# Patient Record
Sex: Female | Born: 1980 | Race: White | Hispanic: No | Marital: Single | Smoking: Current every day smoker
Health system: Southern US, Community
[De-identification: ages and names within clinical notes are randomized; demographics above are authoritative.]

## PROBLEM LIST (undated history)

## (undated) ENCOUNTER — Emergency Department: Payer: 59 | Source: Home / Self Care

## (undated) DIAGNOSIS — G43909 Migraine, unspecified, not intractable, without status migrainosus: Secondary | ICD-10-CM

## (undated) DIAGNOSIS — F329 Major depressive disorder, single episode, unspecified: Secondary | ICD-10-CM

## (undated) DIAGNOSIS — E119 Type 2 diabetes mellitus without complications: Secondary | ICD-10-CM

## (undated) DIAGNOSIS — F32A Depression, unspecified: Secondary | ICD-10-CM

## (undated) DIAGNOSIS — F99 Mental disorder, not otherwise specified: Secondary | ICD-10-CM

## (undated) DIAGNOSIS — F419 Anxiety disorder, unspecified: Secondary | ICD-10-CM

## (undated) DIAGNOSIS — J449 Chronic obstructive pulmonary disease, unspecified: Secondary | ICD-10-CM

## (undated) DIAGNOSIS — M1990 Unspecified osteoarthritis, unspecified site: Secondary | ICD-10-CM

## (undated) HISTORY — PX: MOUTH SURGERY: SHX715

---

## 2006-11-06 ENCOUNTER — Ambulatory Visit: Payer: Self-pay | Admitting: Internal Medicine

## 2012-09-20 DIAGNOSIS — F603 Borderline personality disorder: Secondary | ICD-10-CM | POA: Diagnosis present

## 2012-09-20 DIAGNOSIS — F329 Major depressive disorder, single episode, unspecified: Secondary | ICD-10-CM | POA: Diagnosis present

## 2012-09-20 DIAGNOSIS — F4310 Post-traumatic stress disorder, unspecified: Secondary | ICD-10-CM | POA: Diagnosis present

## 2016-07-05 ENCOUNTER — Ambulatory Visit
Admission: EM | Admit: 2016-07-05 | Discharge: 2016-07-05 | Disposition: A | Discharge status: Home / Self Care | Payer: Medicare Other | Attending: Internal Medicine | Admitting: Internal Medicine

## 2016-07-05 ENCOUNTER — Encounter: Payer: Self-pay | Admitting: *Deleted

## 2016-07-05 DIAGNOSIS — M25462 Effusion, left knee: Secondary | ICD-10-CM | POA: Diagnosis not present

## 2016-07-05 HISTORY — DX: Mental disorder, not otherwise specified: F99

## 2016-07-05 HISTORY — DX: Migraine, unspecified, not intractable, without status migrainosus: G43.909

## 2016-07-05 MED ORDER — TRIAMCINOLONE ACETONIDE 40 MG/ML IJ SUSP: 40.0000 mg | Freq: Once | INTRAMUSCULAR | Status: DC

## 2016-07-05 MED ORDER — CEPHALEXIN 500 MG PO CAPS: 500.0000 mg | ORAL_CAPSULE | Freq: Three times a day (TID) | ORAL | 0 refills | Status: AC

## 2016-07-05 MED ORDER — LIDOCAINE HCL (PF) 1 % IJ SOLN: 5.0000 mL | Freq: Once | INTRAMUSCULAR | Status: DC

## 2016-07-05 NOTE — ED Triage Notes (Signed)
Left knee pain x2 weeks. Gradual onset. Feels as though left knee will "lock up or give way". Denies recent injury.

## 2016-07-20 ENCOUNTER — Ambulatory Visit
Admission: EM | Admit: 2016-07-20 | Discharge: 2016-07-20 | Disposition: A | Discharge status: Home / Self Care | Payer: Medicare Other | Attending: Family Medicine | Admitting: Family Medicine

## 2016-07-20 ENCOUNTER — Encounter: Payer: Self-pay | Admitting: Emergency Medicine

## 2016-07-20 DIAGNOSIS — J069 Acute upper respiratory infection, unspecified: Secondary | ICD-10-CM

## 2016-07-20 DIAGNOSIS — L502 Urticaria due to cold and heat: Secondary | ICD-10-CM

## 2016-07-20 DIAGNOSIS — B9789 Other viral agents as the cause of diseases classified elsewhere: Secondary | ICD-10-CM

## 2016-07-20 NOTE — Discharge Instructions (Signed)
Zyrtec or Allegra once a day in morning Plus one Pepcid or Zanctac tablet in morning Then Benadryl at night

## 2016-07-20 NOTE — ED Provider Notes (Signed)
[facility]    CSN: [id number] Arrival date & time: 07/20/16  8295     History   Chief Complaint Chief Complaint  Patient presents with  . Cough  . Rash    HPI Natalie Rose is a 36 y.o. female.   The history is provided by the patient.  URI  Presenting symptoms: congestion, cough and rhinorrhea   Severity:  Moderate Onset quality:  Sudden Duration:  3 days Timing:  Constant Progression:  Unchanged Chronicity:  New Associated symptoms comment:  Itchy rash Risk factors: sick contacts   Risk factors: not elderly, no chronic cardiac disease, no chronic kidney disease, no chronic respiratory disease, no diabetes mellitus, no immunosuppression, no recent illness and no recent travel     Past Medical History:  Diagnosis Date  . Mental health disorder   . Migraines     There are no active problems to display for this patient.   History reviewed. No pertinent surgical history.  OB History    No data available       Home Medications    Prior to Admission medications   Medication Sig Start Date End Date Taking? Authorizing Provider  gabapentin (NEURONTIN) 300 MG capsule Take 300 mg by mouth 3 (three) times daily.    Historical Provider, MD  QUEtiapine (SEROQUEL) 100 MG tablet Take 100 mg by mouth at bedtime.    Historical Provider, MD  venlafaxine (EFFEXOR) 37.5 MG tablet Take 225 mg by mouth 2 (two) times daily.    Historical Provider, MD    Family History History reviewed. No pertinent family history.  Social History Social History  Substance Use Topics  . Smoking status: Current Every Day Smoker  . Smokeless tobacco: Never Used  . Alcohol use No     Allergies   Patient has no known allergies.   Review of Systems Review of Systems  HENT: Positive for congestion and rhinorrhea.   Respiratory: Positive for cough.      Physical Exam Triage Vital Signs ED Triage Vitals  Enc Vitals Group     BP 07/20/16 1028 104/72     Pulse  Rate 07/20/16 1028 91     Resp 07/20/16 1028 16     Temp 07/20/16 1028 99.1 F (37.3 C)     Temp Source 07/20/16 1028 Oral     SpO2 07/20/16 1028 99 %     Weight 07/20/16 1027 160 lb (72.6 kg)     Height 07/20/16 1027 4\' 11"  (1.499 m)     Head Circumference --      Peak Flow --      Pain Score 07/20/16 1028 0     Pain Loc --      Pain Edu? --      Excl. in GC? --    No data found.   Updated Vital Signs BP 104/72 (BP Location: Left Arm)   Pulse 91   Temp 99.1 F (37.3 C) (Oral)   Resp 16   Ht 4\' 11"  (1.499 m)   Wt 160 lb (72.6 kg)   LMP 07/04/2016 (Exact Date)   SpO2 99%   BMI 32.32 kg/m   Visual Acuity Right Eye Distance:   Left Eye Distance:   Bilateral Distance:    Right Eye Near:   Left Eye Near:    Bilateral Near:     Physical Exam  Constitutional: She appears well-developed and well-nourished. No distress.  HENT:  Head: Normocephalic and atraumatic.  Right Ear: Tympanic  membrane, external ear and ear canal normal.  Left Ear: Tympanic membrane, external ear and ear canal normal.  Nose: No mucosal edema, rhinorrhea, nose lacerations, sinus tenderness, nasal deformity, septal deviation or nasal septal hematoma. No epistaxis.  No foreign bodies. Right sinus exhibits no maxillary sinus tenderness and no frontal sinus tenderness. Left sinus exhibits no maxillary sinus tenderness and no frontal sinus tenderness.  Mouth/Throat: Uvula is midline, oropharynx is clear and moist and mucous membranes are normal. No oropharyngeal exudate.  Eyes: Conjunctivae and EOM are normal. Pupils are equal, round, and reactive to light. Right eye exhibits no discharge. Left eye exhibits no discharge. No scleral icterus.  Neck: Normal range of motion. Neck supple. No thyromegaly present.  Cardiovascular: Normal rate, regular rhythm and normal heart sounds.   Pulmonary/Chest: Effort normal and breath sounds normal. No respiratory distress. She has no wheezes. She has no rales.    Lymphadenopathy:    She has no cervical adenopathy.  Skin: Rash noted. Rash is urticarial. She is not diaphoretic.     Nursing note and vitals reviewed.    UC Treatments / Results  Labs (all labs ordered are listed, but only abnormal results are displayed) Labs Reviewed - No data to display  EKG  EKG Interpretation None       Radiology No results found.  Procedures Procedures (including critical care time)  Medications Ordered in UC Medications - No data to display   Initial Impression / Assessment and Plan / UC Course  I have reviewed the triage vital signs and the nursing notes.  Pertinent labs & imaging results that were available during my care of the patient were reviewed by me and considered in my medical decision making (see chart for details).       Final Clinical Impressions(s) / UC Diagnoses   Final diagnoses:  Urticaria due to cold  Viral URI with cough    New Prescriptions Discharge Medication List as of 07/20/2016 11:02 AM     1. diagnosis reviewed with patient 2.  Recommend supportive treatment with otc antihistamines, fluids, otc cold/cough medication   3. Follow-up prn if symptoms worsen or don't improve   Payton Mccallumrlando Stella Encarnacion, MD 07/20/16 2039

## 2016-07-20 NOTE — ED Triage Notes (Signed)
Patient c/o itchy rash on her right arm and legs that started last night.  Patient also c/o cough for the past 2 days. Patient denies fevers.

## 2016-08-05 NOTE — ED Provider Notes (Signed)
[facility]    CSN: [id number] Arrival date & time: 07/05/16  1544     History   Chief Complaint Chief Complaint  Patient presents with  . Knee Pain    HPI Natalie Mohmanda K Rose is a 36 y.o. female.   C/o left knee swelling.  Denies fevers or rash. Painful to palpation.  Denies injury to knee.      Past Medical History:  Diagnosis Date  . Mental health disorder   . Migraines     There are no active problems to display for this patient.   History reviewed. No pertinent surgical history.  OB History    No data available       Home Medications    Prior to Admission medications   Medication Sig Start Date End Date Taking? Authorizing Provider  gabapentin (NEURONTIN) 300 MG capsule Take 300 mg by mouth 3 (three) times daily.   Yes Historical Provider, MD  QUEtiapine (SEROQUEL) 100 MG tablet Take 100 mg by mouth at bedtime.   Yes Historical Provider, MD  venlafaxine (EFFEXOR) 37.5 MG tablet Take 225 mg by mouth 2 (two) times daily.   Yes Historical Provider, MD    Family History History reviewed. No pertinent family history.  Social History Social History  Substance Use Topics  . Smoking status: Current Every Day Smoker  . Smokeless tobacco: Never Used  . Alcohol use No     Allergies   Patient has no known allergies.   Review of Systems Review of Systems  Constitutional: Negative for chills and fever.  HENT: Negative for sore throat and tinnitus.   Eyes: Negative for redness.  Respiratory: Negative for cough and shortness of breath.   Cardiovascular: Negative for chest pain and palpitations.  Gastrointestinal: Negative for abdominal pain, diarrhea, nausea and vomiting.  Genitourinary: Negative for dysuria, frequency and urgency.  Musculoskeletal: Positive for joint swelling. Negative for myalgias.  Skin: Negative for rash.       No lesions  Neurological: Negative for weakness.  Hematological: Does not bruise/bleed easily.    Psychiatric/Behavioral: Negative for suicidal ideas.     Physical Exam Triage Vital Signs ED Triage Vitals  Enc Vitals Group     BP 07/05/16 1607 126/78     Pulse Rate 07/05/16 1607 (!) 55     Resp 07/05/16 1607 16     Temp 07/05/16 1607 99 F (37.2 C)     Temp Source 07/05/16 1607 Oral     SpO2 07/05/16 1607 100 %     Weight 07/05/16 1608 160 lb (72.6 kg)     Height 07/05/16 1608 4\' 11"  (1.499 m)     Head Circumference --      Peak Flow --      Pain Score 07/05/16 1810 0     Pain Loc --      Pain Edu? --      Excl. in GC? --    No data found.   Updated Vital Signs BP 126/78 (BP Location: Left Arm)   Pulse (!) 55   Temp 99 F (37.2 C) (Oral)   Resp 16   Ht 4\' 11"  (1.499 m)   Wt 160 lb (72.6 kg)   LMP 07/04/2016   SpO2 100%   BMI 32.32 kg/m   Visual Acuity Right Eye Distance:   Left Eye Distance:   Bilateral Distance:    Right Eye Near:   Left Eye Near:    Bilateral Near:     Physical  Exam  Constitutional: She is oriented to person, place, and time. She appears well-developed and well-nourished. No distress.  HENT:  Head: Normocephalic and atraumatic.  Mouth/Throat: Oropharynx is clear and moist.  Eyes: Conjunctivae and EOM are normal. Pupils are equal, round, and reactive to light. No scleral icterus.  Neck: Normal range of motion. Neck supple. No JVD present. No tracheal deviation present. No thyromegaly present.  Cardiovascular: Normal rate, regular rhythm and normal heart sounds.  Exam reveals no gallop and no friction rub.   No murmur heard. Pulmonary/Chest: Effort normal and breath sounds normal.  Abdominal: Soft. Bowel sounds are normal. She exhibits no distension. There is no tenderness.  Musculoskeletal: Normal range of motion. She exhibits no edema.  Left knee ballotable  Lymphadenopathy:    She has no cervical adenopathy.  Neurological: She is alert and oriented to person, place, and time. No cranial nerve deficit.  Skin: Skin is warm and  dry.  Psychiatric: She has a normal mood and affect. Her behavior is normal. Judgment and thought content normal.  Nursing note and vitals reviewed.    UC Treatments / Results  Labs (all labs ordered are listed, but only abnormal results are displayed) Labs Reviewed - No data to display  EKG  EKG Interpretation None       Radiology No results found.  Procedures Procedures (including critical care time)  Medications Ordered in UC Medications - No data to display   Initial Impression / Assessment and Plan / UC Course  I have reviewed the triage vital signs and the nursing notes.  Pertinent labs & imaging results that were available during my care of the patient were reviewed by me and considered in my medical decision making (see chart for details).     Attempted aspiration of left knee but scant amount of fluid obtained. Minimal blood loss.  Pt tolerated procedure well.  Rx abx prophylactically  Final Clinical Impressions(s) / UC Diagnoses   Final diagnoses:  Effusion of left knee    New Prescriptions Discharge Medication List as of 07/05/2016  6:06 PM    START taking these medications   Details  cephALEXin (KEFLEX) 500 MG capsule Take 1 capsule (500 mg total) by mouth 3 (three) times daily., Starting Thu 07/05/2016, Until Thu 07/12/2016, Normal         Arnaldo Natal, MD 08/05/16 [phone]

## 2017-01-28 ENCOUNTER — Encounter: Payer: Self-pay | Admitting: Emergency Medicine

## 2017-01-28 ENCOUNTER — Ambulatory Visit
Admission: EM | Admit: 2017-01-28 | Discharge: 2017-01-28 | Disposition: A | Discharge status: Home / Self Care | Payer: Medicare Other | Attending: Family Medicine | Admitting: Family Medicine

## 2017-01-28 DIAGNOSIS — S161XXA Strain of muscle, fascia and tendon at neck level, initial encounter: Secondary | ICD-10-CM

## 2017-01-28 DIAGNOSIS — S20211A Contusion of right front wall of thorax, initial encounter: Secondary | ICD-10-CM

## 2017-01-28 LAB — RAPID STREP SCREEN (MED CTR MEBANE ONLY): Streptococcus, Group A Screen (Direct): NEGATIVE

## 2017-01-28 NOTE — ED Triage Notes (Signed)
Patient c/o sore throat and stomach pain that started 2-3 days.

## 2017-01-28 NOTE — ED Provider Notes (Addendum)
[facility]    CSN: [id number] Arrival date & time: 01/28/17  1455     History   Chief Complaint Chief Complaint  Patient presents with  . Sore Throat  . Abdominal Pain    HPI Natalie Rose is a 36 y.o. female.   HPI  This is a 36 year old female presents with a sore throat and diffuse stomach pain that started approximately 2-3 days ago. She states this morning she started with dry heave & coughing. She also has had loose but formed stool without any blood or mucus. She also states that she fell about 2 days ago she slipped on wet steps and fell down 4 steps  On her back and side. She has a bruise on her left forearm is resolving. There are no bruises on her back or her buttock.       Past Medical History:  Diagnosis Date  . Mental health disorder   . Migraines     There are no active problems to display for this patient.   History reviewed. No pertinent surgical history.  OB History    No data available       Home Medications    Prior to Admission medications   Medication Sig Start Date End Date Taking? Authorizing Provider  cyclobenzaprine (FLEXERIL) 10 MG tablet Take 10 mg by mouth 3 (three) times daily as needed for muscle spasms.   Yes [provider]  lamoTRIgine (LAMICTAL) 200 MG tablet Take 200 mg by mouth daily.   Yes [provider]  naproxen (NAPROSYN) 500 MG tablet Take 500 mg by mouth 2 (two) times daily with a meal.   Yes [provider]  gabapentin (NEURONTIN) 300 MG capsule Take 300 mg by mouth 3 (three) times daily.    [provider]  venlafaxine (EFFEXOR) 37.5 MG tablet Take 225 mg by mouth 2 (two) times daily.    [provider]    Family History History reviewed. No pertinent family history.  Social History Social History  Substance Use Topics  . Smoking status: Current Every Day Smoker  . Smokeless tobacco: Never Used  . Alcohol use No     Allergies   Valium  [diazepam]   Review of Systems Review of Systems  Constitutional: Positive for activity change. Negative for chills, fatigue and fever.  Gastrointestinal: Positive for abdominal pain, diarrhea and vomiting.  Musculoskeletal: Positive for back pain and myalgias.  All other systems reviewed and are negative.    Physical Exam Triage Vital Signs ED Triage Vitals  Enc Vitals Group     BP 01/28/17 1513 107/68     Pulse Rate 01/28/17 1513 96     Resp 01/28/17 1513 16     Temp 01/28/17 1513 98.6 F (37 C)     Temp Source 01/28/17 1513 Oral     SpO2 01/28/17 1513 100 %     Weight 01/28/17 1511 160 lb (72.6 kg)     Height --      Head Circumference --      Peak Flow --      Pain Score 01/28/17 1511 6     Pain Loc --      Pain Edu? --      Excl. in GC? --    No data found.   Updated Vital Signs BP 107/68 (BP Location: Left Arm)   Pulse 96   Temp 98.6 F (37 C) (Oral)   Resp 16   Wt 160 lb (  72.6 kg)   LMP 01/07/2017 (Approximate)   SpO2 100%   BMI 32.32 kg/m   Visual Acuity Right Eye Distance:   Left Eye Distance:   Bilateral Distance:    Right Eye Near:   Left Eye Near:    Bilateral Near:     Physical Exam  Constitutional: She is oriented to person, place, and time. She appears well-developed and well-nourished. No distress.  Examination was performed in the presence of Heather, RN as chaperone and assisted.  HENT:  Head: Normocephalic.  Right Ear: External ear normal.  Left Ear: External ear normal.  Nose: Nose normal.  Mouth/Throat: Oropharynx is clear and moist. No oropharyngeal exudate.  Eyes: Pupils are equal, round, and reactive to light. Right eye exhibits no discharge. Left eye exhibits no discharge.  Neck:  Examination of the cervical spine shows tenderness to palpation in the sternocleidomastoid muscles as well as the paraspinous cervical muscles. She is able to forward flex to well but extension causes her reproduction of the pain and she's been  having in her neck. It is also decreased somewhat.  Pulmonary/Chest: Effort normal and breath sounds normal. She exhibits tenderness.  There is point tenderness over her right sided ribs from the mid scapular line and laterally to the anterior axillary line. Reproduces her symptoms. She does not have significant abdominal tenderness.  Abdominal: Soft. Bowel sounds are normal. She exhibits no distension. There is no tenderness. There is no rebound and no guarding.  Musculoskeletal: Normal range of motion. She exhibits tenderness.  Neurological: She is alert and oriented to person, place, and time.  Skin: Skin is warm and dry. She is not diaphoretic.  Psychiatric: She has a normal mood and affect. Her behavior is normal. Judgment and thought content normal.  Nursing note and vitals reviewed.    UC Treatments / Results  Labs (all labs ordered are listed, but only abnormal results are displayed) Labs Reviewed  RAPID STREP SCREEN (NOT AT [facility])  CULTURE, GROUP A STREP [facility])    EKG  EKG Interpretation None       Radiology No results found.  Procedures Procedures (including critical care time)  Medications Ordered in UC Medications - No data to display   Initial Impression / Assessment and Plan / UC Course  I have reviewed the triage vital signs and the nursing notes.  Pertinent labs & imaging results that were available during my care of the patient were reviewed by me and considered in my medical decision making (see chart for details).     Plan: 1. Test/x-ray results and diagnosis reviewed with patient 2. rx as per orders; risks, benefits, potential side effects reviewed with patient 3. Recommend supportive treatment with use of ice on the areas of pain 20 minutes out of every 2 hours 4-5 times daily. Patient already has prescriptions for Flexeril and for Naprosyn which she will take for the pain and muscle spasm. I recommended that she follow-up with her primary care  physician if she is not improving in a week or 2. I was reminded her that she must cough and deep breathe to prevent pneumonitis or pneumonia. 4. F/u prn if symptoms worsen or don't improve   Final Clinical Impressions(s) / UC Diagnoses   Final diagnoses:  Contusion of ribs, right, initial encounter  Cervical strain, acute, initial encounter    New Prescriptions Discharge Medication List as of 01/28/2017  3:51 PM       Controlled Substance Prescriptions [place] Controlled Substance Registry consulted?  Not Applicable   Lutricia Feil, PA-C 01/28/17 1600    Lutricia Feil, PA-C 01/28/17 1601

## 2017-01-31 LAB — CULTURE, GROUP A STREP (THRC)

## 2017-02-19 ENCOUNTER — Encounter: Payer: Self-pay | Admitting: *Deleted

## 2017-02-19 ENCOUNTER — Ambulatory Visit
Admission: EM | Admit: 2017-02-19 | Discharge: 2017-02-19 | Disposition: A | Discharge status: Other Acute Inpatient Hospital | Payer: Medicare Other | Attending: Family Medicine | Admitting: Family Medicine

## 2017-02-19 DIAGNOSIS — R1011 Right upper quadrant pain: Secondary | ICD-10-CM | POA: Diagnosis not present

## 2017-02-19 NOTE — ED Provider Notes (Signed)
[facility]    CSN: [id number] Arrival date & time: 02/19/17  1557     History   Chief Complaint Chief Complaint  Patient presents with  . Abdominal Pain  . Nausea    HPI Natalie Rose is a 36 y.o. female.   The history is provided by the patient.  Abdominal Pain  Pain location:  RUQ Pain quality: aching   Pain radiates to:  Does not radiate Pain severity:  Severe Onset quality:  Sudden Duration:  12 hours Timing:  Constant Progression:  Worsening Chronicity:  New Context: eating   Context: not alcohol use, not awakening from sleep, not diet changes, not laxative use, not medication withdrawal, not previous surgeries, not recent illness, not recent sexual activity, not recent travel, not retching, not sick contacts, not suspicious food intake and not trauma   Relieved by:  Nothing Ineffective treatments:  None tried Associated symptoms: nausea   Associated symptoms: no anorexia, no belching, no chest pain, no chills, no constipation, no cough, no diarrhea, no dysuria, no fatigue, no fever, no flatus, no hematemesis, no hematochezia, no hematuria, no melena, no shortness of breath, no sore throat, no vaginal bleeding, no vaginal discharge and no vomiting   Risk factors: no alcohol abuse, no aspirin use, not elderly, has not had multiple surgeries, no NSAID use, not obese, not pregnant and no recent hospitalization     Past Medical History:  Diagnosis Date  . Mental health disorder   . Migraines     There are no active problems to display for this patient.   History reviewed. No pertinent surgical history.  OB History    No data available       Home Medications    Prior to Admission medications   Medication Sig Start Date End Date Taking? Authorizing Provider  cyclobenzaprine (FLEXERIL) 10 MG tablet Take 10 mg by mouth 3 (three) times daily as needed for muscle spasms.   Yes [provider]  gabapentin (NEURONTIN) 300 MG capsule  Take 300 mg by mouth 3 (three) times daily.   Yes [provider]  naproxen (NAPROSYN) 500 MG tablet Take 500 mg by mouth 2 (two) times daily with a meal.   Yes [provider]  sertraline (ZOLOFT) 50 MG tablet Take 50 mg by mouth daily.   Yes [provider]  lamoTRIgine (LAMICTAL) 200 MG tablet Take 200 mg by mouth daily.    [provider]  venlafaxine (EFFEXOR) 37.5 MG tablet Take 225 mg by mouth 2 (two) times daily.    [provider]    Family History History reviewed. No pertinent family history.  Social History Social History  Substance Use Topics  . Smoking status: Current Every Day Smoker  . Smokeless tobacco: Never Used  . Alcohol use No     Allergies   Valium [diazepam]   Review of Systems Review of Systems  Constitutional: Negative for chills, fatigue and fever.  HENT: Negative for sore throat.   Respiratory: Negative for cough and shortness of breath.   Cardiovascular: Negative for chest pain.  Gastrointestinal: Positive for abdominal pain and nausea. Negative for anorexia, constipation, diarrhea, flatus, hematemesis, hematochezia, melena and vomiting.  Genitourinary: Negative for dysuria, hematuria, vaginal bleeding and vaginal discharge.     Physical Exam Triage Vital Signs ED Triage Vitals  Enc Vitals Group     BP 02/19/17 1728 113/77     Pulse Rate 02/19/17 1728 80     Resp 02/19/17 1728  16     Temp 02/19/17 1728 97.6 F (36.4 C)     Temp Source 02/19/17 1728 Oral     SpO2 02/19/17 1728 99 %     Weight 02/19/17 1730 150 lb (68 kg)     Height 02/19/17 1730  (1.499 m)     Head Circumference --      Peak Flow --      Pain Score 02/19/17 1731 6     Pain Loc --      Pain Edu? --      Excl. in GC? --    No data found.   Updated Vital Signs BP 113/77 (BP Location: Left Arm)   Pulse 80   Temp 97.6 F (36.4 C) (Oral)   Resp 16   Ht  (1.499 m)   Wt 150 lb (68 kg)   LMP 02/03/2017  (Approximate)   SpO2 99%   BMI 30.30 kg/m   Visual Acuity Right Eye Distance:   Left Eye Distance:   Bilateral Distance:    Right Eye Near:   Left Eye Near:    Bilateral Near:     Physical Exam  Constitutional: She appears well-developed and well-nourished. No distress.  Abdominal: Soft. Bowel sounds are normal. She exhibits no distension and no mass. There is tenderness (right upper quadrant). There is no rebound and no guarding.  Skin: She is not diaphoretic.  Nursing note and vitals reviewed.    UC Treatments / Results  Labs (all labs ordered are listed, but only abnormal results are displayed) Labs Reviewed - No data to display  EKG  EKG Interpretation None       Radiology No results found.  Procedures Procedures (including critical care time)  Medications Ordered in UC Medications - No data to display   Initial Impression / Assessment and Plan / UC Course  I have reviewed the triage vital signs and the nursing notes.  Pertinent labs & imaging results that were available during my care of the patient were reviewed by me and considered in my medical decision making (see chart for details).      Final Clinical Impressions(s) / UC Diagnoses   Final diagnoses:  RUQ abdominal pain    New Prescriptions Discharge Medication List as of 02/19/2017  5:53 PM     1. Possible diagnosis discussed with patient; recommend patient go to ED for further evaluation and management. Patient verbalizes understanding, is in stable condition and will proceed by private vehicle.    Controlled Substance Prescriptions [place] Controlled Substance Registry consulted? Not Applicable   Payton Mccallum, MD 02/19/17 [phone]

## 2017-02-19 NOTE — Discharge Instructions (Signed)
Due to patient's acute right upper abdominal pain, I discussed with patient possible causes including gallbladder and recommend patient go to the Emergency Department for further evaluation (ultrasound, blood tests) and management

## 2017-02-19 NOTE — ED Triage Notes (Signed)
RUQ abd pain since this am, worse following meals. Also, nausea.

## 2017-03-20 ENCOUNTER — Encounter: Payer: Self-pay | Admitting: *Deleted

## 2017-03-20 ENCOUNTER — Ambulatory Visit
Admission: EM | Admit: 2017-03-20 | Discharge: 2017-03-20 | Disposition: A | Discharge status: Home / Self Care | Payer: Medicare Other | Attending: Family Medicine | Admitting: Family Medicine

## 2017-03-20 DIAGNOSIS — J029 Acute pharyngitis, unspecified: Secondary | ICD-10-CM | POA: Diagnosis not present

## 2017-03-20 DIAGNOSIS — J039 Acute tonsillitis, unspecified: Secondary | ICD-10-CM

## 2017-03-20 DIAGNOSIS — H9203 Otalgia, bilateral: Secondary | ICD-10-CM | POA: Diagnosis not present

## 2017-03-20 DIAGNOSIS — R05 Cough: Secondary | ICD-10-CM | POA: Diagnosis not present

## 2017-03-20 LAB — RAPID STREP SCREEN (MED CTR MEBANE ONLY): Streptococcus, Group A Screen (Direct): NEGATIVE

## 2017-03-20 MED ORDER — AMOXICILLIN 875 MG PO TABS: 875.0000 mg | ORAL_TABLET | Freq: Two times a day (BID) | ORAL | 0 refills | Status: AC

## 2017-03-20 MED ORDER — MENTHOL 5.4 MG MT LOZG: 1.0000 | LOZENGE | Freq: Two times a day (BID) | OROMUCOSAL | 0 refills | Status: DC | PRN

## 2017-03-20 NOTE — Discharge Instructions (Signed)
Saline gargles 1-2 times daily. Tylenol/Motrin as needed for pain

## 2017-03-20 NOTE — ED Triage Notes (Signed)
PAtient started having symptoms of sore throat and bilateral ear pain 3 days ago.

## 2017-03-20 NOTE — ED Provider Notes (Signed)
[facility]    CSN: [id number] Arrival date & time: 03/20/17  1035     History   Chief Complaint Chief Complaint  Patient presents with  . Sore Throat    HPI Natalie Rose is a 36 y.o. female presented to clinic with CC of sore throats x 4 days gradually worsening. Paiful to swallow. Also reporting B/L ear pain. Denies fever/chills.  The history is provided by the patient.  Sore Throat  This is a new problem. The current episode started more than 2 days ago. The problem occurs constantly. The problem has been rapidly worsening. The symptoms are aggravated by swallowing and drinking. Nothing relieves the symptoms.    Past Medical History:  Diagnosis Date  . Mental health disorder   . Migraines     There are no active problems to display for this patient.   History reviewed. No pertinent surgical history.  OB History    No data available       Home Medications    Prior to Admission medications   Medication Sig Start Date End Date Taking? Authorizing Provider  cyclobenzaprine (FLEXERIL) 10 MG tablet Take 10 mg by mouth 3 (three) times daily as needed for muscle spasms.   Yes [provider]  esomeprazole (NEXIUM) 40 MG capsule Take 40 mg by mouth daily at 12 noon.   Yes [provider]  gabapentin (NEURONTIN) 300 MG capsule Take 300 mg by mouth 3 (three) times daily.   Yes [provider]  naproxen (NAPROSYN) 500 MG tablet Take 500 mg by mouth 2 (two) times daily with a meal.   Yes [provider]  sertraline (ZOLOFT) 100 MG tablet Take 100 mg by mouth daily.   Yes [provider]  amoxicillin (AMOXIL) 875 MG tablet Take 1 tablet (875 mg total) by mouth 2 (two) times daily. 03/20/17 03/30/17  Korey Arroyo, NP  lamoTRIgine (LAMICTAL) 200 MG tablet Take 200 mg by mouth daily.    [provider]  Menthol (CEPACOL SORE THROAT) 5.4 MG LOZG Use as directed 1 lozenge (5.4 mg total) in the mouth or  throat 2 (two) times daily as needed. 03/20/17   Florian Chauca, NP  sertraline (ZOLOFT) 50 MG tablet Take 50 mg by mouth daily.    [provider]  venlafaxine (EFFEXOR) 37.5 MG tablet Take 225 mg by mouth 2 (two) times daily.    [provider]    Family History History reviewed. No pertinent family history.  Social History Social History  Substance Use Topics  . Smoking status: Current Every Day Smoker  . Smokeless tobacco: Never Used  . Alcohol use No     Allergies   Valium [diazepam]   Review of Systems Review of Systems  Constitutional: Negative.   HENT: Positive for ear pain, sore throat and trouble swallowing.   Respiratory: Positive for cough.   Cardiovascular: Negative.   Neurological: Negative.   Psychiatric/Behavioral: Negative.      Physical Exam Triage Vital Signs ED Triage Vitals  Enc Vitals Group     BP 03/20/17 1055 111/68     Pulse Rate 03/20/17 1055 75     Resp 03/20/17 1055 16     Temp 03/20/17 1055 97.8 F (36.6 C)     Temp Source 03/20/17 1055 Oral     SpO2 03/20/17 1055 100 %     Weight --      Height --      Head Circumference --  Peak Flow --      Pain Score 03/20/17 1057 6     Pain Loc --      Pain Edu? --      Excl. in GC? --    No data found.   Updated Vital Signs BP 111/68 (BP Location: Left Arm)   Pulse 75   Temp 97.8 F (36.6 C) (Oral)   Resp 16   LMP 03/06/2017   SpO2 100%   Visual Acuity Right Eye Distance:   Left Eye Distance:   Bilateral Distance:    Right Eye Near:   Left Eye Near:    Bilateral Near:     Physical Exam  Constitutional: She appears well-developed. No distress.  HENT:  Head: Normocephalic.  Right Ear: Hearing and tympanic membrane normal.  Left Ear: Hearing and tympanic membrane normal.  Ears:  Mouth/Throat: Posterior oropharyngeal edema (Mild erythema and edema.) present. No oropharyngeal exudate. Tonsils are 2+ on the right. Tonsils are 2+ on the left.  Tonsillar exudate (B/L tonsillar glands inflammation, injection and exudate. Pt reports painful to swallow. Denies difficulty swallowing ).  Eyes: Pupils are equal, round, and reactive to light. EOM are normal.  Cardiovascular: Normal rate and regular rhythm.   Pulmonary/Chest: Effort normal and breath sounds normal. She has no wheezes. She exhibits no tenderness.  Reports occasional cough with yellow to clear sputum   Lymphadenopathy:    She has no cervical adenopathy.  Psychiatric: She has a normal mood and affect. Her behavior is normal.     UC Treatments / Results  Labs (all labs ordered are listed, but only abnormal results are displayed) Labs Reviewed  RAPID STREP SCREEN (NOT AT [facility])  CULTURE, GROUP A STREP [facility])    EKG  EKG Interpretation None       Radiology No results found.  Procedures Procedures (including critical care time)  Medications Ordered in UC Medications - No data to display   Initial Impression / Assessment and Plan / UC Course  I have reviewed the triage vital signs and the nursing notes.  Pertinent labs & imaging results that were available during my care of the patient were reviewed by me and considered in my medical decision making (see chart for details).    Rapid Strep negative. High suspicion of Bacterial Tonsillitis based don clinic exam. Empiric Tx initiated. Pt agrees with plan of care   Final Clinical Impressions(s) / UC Diagnoses   Final diagnoses:  None    New Prescriptions New Prescriptions   AMOXICILLIN (AMOXIL) 875 MG TABLET    Take 1 tablet (875 mg total) by mouth 2 (two) times daily.   MENTHOL (CEPACOL SORE THROAT) 5.4 MG LOZG    Use as directed 1 lozenge (5.4 mg total) in the mouth or throat 2 (two) times daily as needed.     Controlled Substance Prescriptions [place] Controlled Substance Registry consulted? Not Applicable   Reinaldo RaddleMultani, Destyn Parfitt, NP 03/20/17 1131

## 2017-03-23 LAB — CULTURE, GROUP A STREP (THRC)

## 2017-03-29 ENCOUNTER — Ambulatory Visit
Admission: EM | Admit: 2017-03-29 | Discharge: 2017-03-29 | Disposition: A | Discharge status: Home / Self Care | Payer: Medicare Other | Attending: Family Medicine | Admitting: Family Medicine

## 2017-03-29 ENCOUNTER — Encounter: Payer: Self-pay | Admitting: Emergency Medicine

## 2017-03-29 DIAGNOSIS — J039 Acute tonsillitis, unspecified: Secondary | ICD-10-CM | POA: Diagnosis not present

## 2017-03-29 DIAGNOSIS — R599 Enlarged lymph nodes, unspecified: Secondary | ICD-10-CM

## 2017-03-29 DIAGNOSIS — J029 Acute pharyngitis, unspecified: Secondary | ICD-10-CM

## 2017-03-29 DIAGNOSIS — R590 Localized enlarged lymph nodes: Secondary | ICD-10-CM

## 2017-03-29 LAB — RAPID STREP SCREEN (MED CTR MEBANE ONLY): Streptococcus, Group A Screen (Direct): NEGATIVE

## 2017-03-29 LAB — MONONUCLEOSIS SCREEN: Mono Screen: NEGATIVE

## 2017-03-29 MED ORDER — DEXAMETHASONE SODIUM PHOSPHATE 10 MG/ML IJ SOLN
10.0000 mg | Freq: Once | INTRAMUSCULAR | Status: AC
  Administered 2017-03-29: 10 mg via INTRAMUSCULAR

## 2017-03-29 NOTE — ED Provider Notes (Signed)
[facility] ____________________________________________  Time seen: Approximately 1100 AM  I have reviewed the triage vital signs and the nursing notes.   HISTORY  Chief Complaint Sore Throat and Lymphadenopathy  HPI Natalie Rose is a 36 y.o. female  presents for evaluation of concern of continued sore throat and lymph node swelling. Patient reports that she was seen in the urgent care last week and was started on oral amoxicillin for strep throat. Patient states she still has a slight sore throat intermittently, but states that she is concerned that she still has some lymph node swelling to her neck. Denies any pain at this time. Denies runny nose, nasal congestion or cough. Reports has continued to eat and drink well. Denies fevers, cough, congestion, pain with range of motion of neck, or other complaints. Reports has been taking the amoxicillin as prescribed and has about one day left. No over the counter medications being taken. Denies chest pain, shortness of breath, abdominal pain, dysuria, or rash. Denies recent sickness. Denies recent antibiotic use.   Patient's last menstrual period was 03/06/2017.Denies pregnancy    Past Medical History:  Diagnosis Date  . Mental health disorder   . Migraines     There are no active problems to display for this patient.   History reviewed. No pertinent surgical history.   No current facility-administered medications for this encounter.   Current Outpatient Prescriptions:  .  amoxicillin (AMOXIL) 875 MG tablet, Take 1 tablet (875 mg total) by mouth 2 (two) times daily., Disp: 20 tablet, Rfl: 0 .  cyclobenzaprine (FLEXERIL) 10 MG tablet, Take 10 mg by mouth 3 (three) times daily as needed for muscle spasms., Disp: , Rfl:  .  esomeprazole (NEXIUM) 40 MG capsule, Take 40 mg by mouth daily at 12 noon., Disp: , Rfl:  .  gabapentin (NEURONTIN) 300 MG capsule, Take 300 mg by mouth 3 (three) times daily., Disp: , Rfl:  .   Menthol (CEPACOL SORE THROAT) 5.4 MG LOZG, Use as directed 1 lozenge (5.4 mg total) in the mouth or throat 2 (two) times daily as needed., Disp: 20 lozenge, Rfl: 0 .  naproxen (NAPROSYN) 500 MG tablet, Take 500 mg by mouth 2 (two) times daily with a meal., Disp: , Rfl:  .  sertraline (ZOLOFT) 100 MG tablet, Take 100 mg by mouth daily., Disp: , Rfl:  .  lamoTRIgine (LAMICTAL) 200 MG tablet, Take 200 mg by mouth daily., Disp: , Rfl:  .  sertraline (ZOLOFT) 50 MG tablet, Take 50 mg by mouth daily., Disp: , Rfl:  .  venlafaxine (EFFEXOR) 37.5 MG tablet, Take 225 mg by mouth 2 (two) times daily., Disp: , Rfl:   Allergies Valium [diazepam]  Family History  Problem Relation Age of Onset  . Osteopenia Mother   . Heart failure Mother   . Osteoarthritis Mother   . Depression Mother   . Diabetes Father     Social History Social History  Substance Use Topics  . Smoking status: Current Every Day Smoker    Packs/day: 1.00    Types: Cigarettes  . Smokeless tobacco: Never Used  . Alcohol use No    Review of Systems Constitutional: No fever/chills ENT: As above.  Cardiovascular: Denies chest pain. Respiratory: Denies shortness of breath. Gastrointestinal: No abdominal pain.  No nausea, no vomiting.   Musculoskeletal: Negative for back pain. Skin: Negative for rash.   ____________________________________________   PHYSICAL EXAM:  VITAL SIGNS: ED Triage Vitals  Enc Vitals Group  BP 03/29/17 1022 109/77     Pulse Rate 03/29/17 1022 75     Resp 03/29/17 1022 16     Temp 03/29/17 1022 98.5 F (36.9 C)     Temp Source 03/29/17 1022 Oral     SpO2 03/29/17 1022 100 %     Weight 03/29/17 1022 150 lb (68 kg)     Height 03/29/17 1022 4\' 11"  (1.499 m)     Head Circumference --      Peak Flow --      Pain Score 03/29/17 1023 3     Pain Loc --      Pain Edu? --      Excl. in GC? --     Constitutional: Alert and oriented. Well appearing and in no acute distress. Eyes: Conjunctivae  are normal.  Head: Atraumatic. No sinus tenderness to palpation. No swelling. No erythema.  Ears: no erythema, normal TMs bilaterally.   Nose:No nasal congestion.  Mouth/Throat: Mucous membranes are moist. No pharyngeal erythema. Mild bilateral tonsillar swelling. No exudate. No uvular shift or deviation.  Neck: No stridor.  No cervical spine tenderness to palpation. Hematological/Lymphatic/Immunilogical: Mild anterior bilateral cervical lymphadenopathy, and right posterior lymph node noted. Cardiovascular: Normal rate, regular rhythm. Grossly normal heart sounds.  Good peripheral circulation. Respiratory: Normal respiratory effort.  No retractions. No wheezes, rales or rhonchi. Good air movement.  Gastrointestinal: Soft and nontender. No hepatosplenomegaly palpated.  Musculoskeletal: Ambulatory with steady gait. No cervical, thoracic or lumbar tenderness to palpation. Neurologic:  Normal speech and language. No gait instability. Skin:  Skin appears warm, dry and intact. No rash noted. Psychiatric: Mood and affect are normal. Speech and behavior are normal.  ___________________________________________   LABS (all labs ordered are listed, but only abnormal results are displayed)  Labs Reviewed  RAPID STREP SCREEN (NOT AT [facility])  CULTURE, GROUP A STREP [facility])  MONONUCLEOSIS SCREEN    PROCEDURES Procedures    INITIAL IMPRESSION / ASSESSMENT AND PLAN / ED COURSE  Pertinent labs & imaging results that were available during my care of the patient were reviewed by me and considered in my medical decision making (see chart for details).  Very well appearing patient. No acute distress. Recent strep and still on amoxicillin, returned for concern of continued tonsillar and lymph swelling. Quick strep negative, will culture. Suspect residual lymph node swelling versus other viral infection cause. Discussed evaluation mono, mono was negative. 10 mg IM Decadron given once in urgent care for  symptomatic treatment. Encouraged avoidance of frequent irritation of lymph nodes, close monitoring and follow-up if persist past 2 weeks. Encourage rest, fluids and supportive care.Discussed indication, risks and benefits of medications with patient.  Discussed follow up with Primary care physician this week. Discussed follow up and return parameters including no resolution or any worsening concerns. Patient verbalized understanding and agreed to plan.   ____________________________________________   FINAL CLINICAL IMPRESSION(S) / ED DIAGNOSES  Final diagnoses:  Pharyngitis, unspecified etiology  Tonsillitis     Discharge Medication List as of 03/29/2017 11:23 AM      Note: This dictation was prepared with Dragon dictation along with smaller phrase technology. Any transcriptional errors that result from this process are unintentional.         Renford DillsMiller, Dareon Nunziato, NP 03/29/17 1302

## 2017-03-29 NOTE — ED Triage Notes (Signed)
Patient in today c/o swollen neck glands and sore throat. Patient was seen on 03/20/17 and is currently taking Amoxicillin for positive strep culture (rapid strep negative).

## 2017-03-29 NOTE — ED Triage Notes (Signed)
Patient notified of negative rapid strep and mono test per Renford DillsLindsey Miller, NP. Patient is to finish the antibiotics that she is already on. Patient voiced understanding.

## 2017-03-29 NOTE — Discharge Instructions (Signed)
Take medication as prescribed. Rest. Drink plenty of fluids.  ° °Follow up with your primary care physician this week as needed. Return to Urgent care for new or worsening concerns.  ° °

## 2017-04-01 LAB — CULTURE, GROUP A STREP (THRC)

## 2017-04-18 ENCOUNTER — Ambulatory Visit
Admission: EM | Admit: 2017-04-18 | Discharge: 2017-04-18 | Disposition: A | Discharge status: Home / Self Care | Payer: Medicaid Other | Attending: Family Medicine | Admitting: Family Medicine

## 2017-04-18 ENCOUNTER — Other Ambulatory Visit: Payer: Self-pay

## 2017-04-18 DIAGNOSIS — H6983 Other specified disorders of Eustachian tube, bilateral: Secondary | ICD-10-CM | POA: Diagnosis not present

## 2017-04-18 HISTORY — DX: Major depressive disorder, single episode, unspecified: F32.9

## 2017-04-18 HISTORY — DX: Unspecified osteoarthritis, unspecified site: M19.90

## 2017-04-18 HISTORY — DX: Depression, unspecified: F32.A

## 2017-04-18 HISTORY — DX: Anxiety disorder, unspecified: F41.9

## 2017-04-18 MED ORDER — FLUTICASONE PROPIONATE 50 MCG/ACT NA SUSP: 2.0000 | Freq: Every day | NASAL | 0 refills | Status: DC

## 2017-04-18 NOTE — ED Triage Notes (Signed)
Pt reports recent URI and strep infection. Has had 2 days of ear pain bilateral, but much worse on the left.

## 2017-04-18 NOTE — ED Provider Notes (Signed)
[facility]    CSN: [id number] Arrival date & time: 04/18/17  1501  History   Chief Complaint Chief Complaint  Patient presents with  . Otalgia   HPI  36 year old female presents with otalgia.  Patient reports 3-4 days of bilateral otalgia. Left greater than right. She's recently been sick. She's had an upper respiratory infection as well as strep infection. She been treated with antibiotic recent. No associated fevers or chills. She reports she continues to have some sore throat. No known exacerbating or relieving factors. Her ear pain is severe. No other reported symptoms. No other complaints at this time.   Past Medical History:  Diagnosis Date  . Anxiety   . Arthritis   . Depression   . Mental health disorder   . Migraines    Past Surgical History:  Procedure Laterality Date  . MOUTH SURGERY     OB History    No data available     Home Medications    Prior to Admission medications   Medication Sig Start Date End Date Taking? Authorizing Provider  carbamazepine (TEGRETOL XR) 400 MG 12 hr tablet Take 400 mg 2 (two) times daily by mouth.   Yes [provider]  esomeprazole (NEXIUM) 40 MG capsule Take 40 mg by mouth daily at 12 noon.    [provider]  fluticasone (FLONASE) 50 MCG/ACT nasal spray Place 2 sprays daily into both nostrils. 04/18/17   Tommie Samsook, Hashim Eichhorst G, DO  gabapentin (NEURONTIN) 300 MG capsule Take 300 mg by mouth 3 (three) times daily.    [provider]  naproxen (NAPROSYN) 500 MG tablet Take 500 mg by mouth 2 (two) times daily with a meal.    [provider]  sertraline (ZOLOFT) 100 MG tablet Take 200 mg daily by mouth.     [provider]   Family History Family History  Problem Relation Age of Onset  . Osteopenia Mother   . Heart failure Mother   . Osteoarthritis Mother   . Depression Mother   . Diabetes Father    Social History Social History   Tobacco Use  . Smoking status: Current  Every Day Smoker    Packs/day: 1.00    Types: Cigarettes  . Smokeless tobacco: Never Used  Substance Use Topics  . Alcohol use: No  . Drug use: Yes    Types: Marijuana    Comment: last use 2 weeks ago   Allergies   Valium [diazepam]   Review of Systems Review of Systems  Constitutional: Negative for fever.  HENT: Positive for ear pain and sore throat.   Respiratory: Positive for cough.    Physical Exam Triage Vital Signs ED Triage Vitals  Enc Vitals Group     BP 04/18/17 1512 109/70     Pulse Rate 04/18/17 1512 72     Resp 04/18/17 1512 16     Temp 04/18/17 1512 (!) 97.5 F (36.4 C)     Temp Source 04/18/17 1512 Oral     SpO2 04/18/17 1512 100 %     Weight 04/18/17 1513 145 lb 8.1 oz (66 kg)     Height 04/18/17 1513 4\' 11"  (1.499 m)     Head Circumference --      Peak Flow --      Pain Score 04/18/17 1513 8     Pain Loc --      Pain Edu? --      Excl. in GC? --    Updated Vital  Signs BP 109/70 (BP Location: Left Arm)   Pulse 72   Temp (!) 97.5 F (36.4 C) (Oral)   Resp 16   Ht 4\' 11"  (1.499 m)   Wt 145 lb 8.1 oz (66 kg)   LMP 04/04/2017   SpO2 100%   BMI 29.39 kg/m    Physical Exam  Constitutional: She is oriented to person, place, and time. She appears well-developed. No distress.  HENT:  Head: Normocephalic and atraumatic.  Oropharynx with apparent postnasal drip. Left TM normal. Mild tragal tenderness. Right TM obscured by cerumen.  Eyes: Conjunctivae are normal. Right eye exhibits no discharge. Left eye exhibits no discharge.  Neck: Neck supple.  Cardiovascular: Normal rate and regular rhythm.  No murmur heard. Pulmonary/Chest: Effort normal and breath sounds normal. No respiratory distress. She has no wheezes. She has no rales.  Neurological: She is alert and oriented to person, place, and time.  Skin: Skin is warm. No rash noted.  Psychiatric: Her behavior is normal. Thought content normal.  Vitals reviewed.  UC Treatments / Results   Labs (all labs ordered are listed, but only abnormal results are displayed) Labs Reviewed - No data to display  EKG  EKG Interpretation None       Radiology No results found.  Procedures Procedures (including critical care time)  Medications Ordered in UC Medications - No data to display   Initial Impression / Assessment and Plan / UC Course  I have reviewed the triage vital signs and the nursing notes.  Pertinent labs & imaging results that were available during my care of the patient were reviewed by me and considered in my medical decision making (see chart for details).     36 year old female presents with otalgia. Exam unremarkable. Appears to be eustachian dysfunction. Treating with Flonase.  Final Clinical Impressions(s) / UC Diagnoses   Final diagnoses:  Dysfunction of both eustachian tubes   ED Discharge Orders        Ordered    fluticasone (FLONASE) 50 MCG/ACT nasal spray  Daily     04/18/17 1552     Controlled Substance Prescriptions [place] Controlled Substance Registry consulted? Not Applicable   Tommie SamsCook, Chinita Schimpf G, DO 04/18/17 [id number]

## 2017-04-18 NOTE — Discharge Instructions (Signed)
Medication as prescribed.  Ibuprofen for pain.  Take care  Dr. Adriana Simasook

## 2017-09-01 ENCOUNTER — Other Ambulatory Visit: Payer: Self-pay

## 2017-09-01 ENCOUNTER — Ambulatory Visit
Admission: EM | Admit: 2017-09-01 | Discharge: 2017-09-01 | Disposition: A | Discharge status: Home / Self Care | Payer: Medicare Other | Attending: Emergency Medicine | Admitting: Emergency Medicine

## 2017-09-01 DIAGNOSIS — M62838 Other muscle spasm: Secondary | ICD-10-CM | POA: Diagnosis not present

## 2017-09-01 MED ORDER — METHOCARBAMOL 750 MG PO TABS: 750.0000 mg | ORAL_TABLET | ORAL | 0 refills | Status: DC

## 2017-09-01 MED ORDER — NAPROXEN 500 MG PO TABS: 500.0000 mg | ORAL_TABLET | Freq: Two times a day (BID) | ORAL | 0 refills | Status: DC

## 2017-09-01 NOTE — Discharge Instructions (Addendum)
Try 500 mg of Naprosyn with 1 g of Tylenol twice a day.  May take 1 g of Tylenol and additional 2 more times a day.  Take the Robaxin on a regular basis.  Try some deep tissue massage.  Tennis ball might be helpful.

## 2017-09-01 NOTE — ED Triage Notes (Signed)
Pt with stiff and painful neck x past 4 days. Hurts more on left side. Pain 8/10

## 2017-09-01 NOTE — ED Provider Notes (Signed)
HPI  SUBJECTIVE:  Natalie Rose is a 37 y.o. female who presents with 4-6 days of sore, dull, achy, constant bilateral neck pain worse on the left more so than the right.  She states it feels as if she has a "crick in her neck".  She denies fevers, dental pain, sore throat, headache.  She is unsure she has swollen lymph nodes, states that she feels a "knot" posteriorly on the left.  She states that this neck pain radiates to her ears bilaterally.  States that she can move her neck but it hurts.  She has had symptoms like this before but they have not been this severe.  She tried heat, ice, Naprosyn OTC, Tylenol with some improvement in her symptoms.  Symptoms are worse with turning her head to the left.  No voice changes, difficulty breathing, sensation of throat swelling shut, trauma to the neck, change in physical activity.  She denies carrying heavy bags.  She denies any change in medications.  She has a past medical history of depression anxiety and PTSD for which she takes Zoloft.  She does not take any other meds on a regular basis.  LMP: The beginning of March and she denies the possibility of being pregnant.  PMD: [place] [facility], [facility].    Past Medical History:  Diagnosis Date  . Anxiety   . Arthritis   . Depression   . Mental health disorder   . Migraines     Past Surgical History:  Procedure Laterality Date  . MOUTH SURGERY      Family History  Problem Relation Age of Onset  . Osteopenia Mother   . Heart failure Mother   . Osteoarthritis Mother   . Depression Mother   . Diabetes Father     Social History   Tobacco Use  . Smoking status: Current Every Day Smoker    Packs/day: 1.00    Types: Cigarettes  . Smokeless tobacco: Never Used  Substance Use Topics  . Alcohol use: No  . Drug use: Yes    Types: Marijuana    Comment: last use several days ago    No current facility-administered medications for this encounter.   Current Outpatient Medications:  .   methocarbamol (ROBAXIN) 750 MG tablet, Take 1 tablet (750 mg total) by mouth every 4 (four) hours., Disp: 40 tablet, Rfl: 0 .  naproxen (NAPROSYN) 500 MG tablet, Take 1 tablet (500 mg total) by mouth 2 (two) times daily., Disp: 20 tablet, Rfl: 0 .  sertraline (ZOLOFT) 100 MG tablet, Take 200 mg daily by mouth. , Disp: , Rfl:   Allergies  Allergen Reactions  . Valium [Diazepam] Hives     ROS  As noted in HPI.   Physical Exam  BP 97/61 (BP Location: Left Arm)   Pulse 61   Temp 97.9 F (36.6 C) (Oral)   Resp 18   Ht 4\' 11"  (1.499 m)   Wt 155 lb (70.3 kg)   LMP 08/02/2017   SpO2 100%   BMI 31.31 kg/m   Constitutional: Well developed, well nourished, no acute distress Eyes:  EOMI, conjunctiva normal bilaterally HENT: Normocephalic, atraumatic,mucus membranes moist normal oropharynx. Respiratory: Normal inspiratory effort Cardiovascular: Normal rate GI: nondistended skin: No rash, skin intact Musculoskeletal: No C-spine tenderness.  Mild limited range of motion but patient is able to flex/extend, rotate her head 45 degrees to the left and right.  Pain aggravated with head rotation to the left.  Positive trapezial muscle tenderness, spasm  left side.  Mild trapezial tenderness of right side.  Sensation down the left arm grossly intact, full strength at the shoulder, elbow, grip strength equal bilaterally. Lymph: No posterior cervical lymph nodes, auricular lymph nodes, anterior cervical lymph nodes. Neurologic: Alert & oriented x 3, no focal neuro deficits Psychiatric: Speech and behavior appropriate   ED Course   Medications - No data to display  No orders of the defined types were placed in this encounter.   No results found for this or any previous visit (from the past 24 hour(s)). No results found.  ED Clinical Impression  Muscle spasm   ED Assessment/Plan  Presentation consistent with a trapezius muscle spasm.  Doubt infectious cause such as a retropharyngeal  abscess, peritonsillar abscess, deep space infection..  She does not have true torticollis.  Home with Naprosyn/Tylenol, Robaxin, deep tissue massage.  Follow-up with primary care physician as needed, discussed medical decision-making, treatment plan, plan for follow-up and signs and symptoms that should prompt return to the emergency department.  She agrees with plan.   Meds ordered this encounter  Medications  . methocarbamol (ROBAXIN) 750 MG tablet    Sig: Take 1 tablet (750 mg total) by mouth every 4 (four) hours.    Dispense:  40 tablet    Refill:  0  . naproxen (NAPROSYN) 500 MG tablet    Sig: Take 1 tablet (500 mg total) by mouth 2 (two) times daily.    Dispense:  20 tablet    Refill:  0    *This clinic note was created using Scientist, clinical (histocompatibility and immunogenetics)Dragon dictation software. Therefore, there may be occasional mistakes despite careful proofreading.   ?   Domenick GongMortenson, Orlandis Sanden, MD 09/02/17 [phone]

## 2017-09-02 ENCOUNTER — Telehealth: Payer: Self-pay | Admitting: Emergency Medicine

## 2017-09-02 NOTE — Telephone Encounter (Signed)
Please call in rx for flexeril 10mg  1 tab po qhs prn #30, no refills

## 2017-09-02 NOTE — Telephone Encounter (Signed)
Prescription for Flexeril 10mg  1 tablet at bedtime as needed #30 no refills called into [facility] in [place] per Dr. Judd Gaudieronty. Patient notified that the prescription has been sent to her pharmacy.

## 2017-09-02 NOTE — Telephone Encounter (Signed)
Patient called and left a message that her insurance will not cover her muscle relaxer Robaxin.  Patient was seen yesterday March 31.  Patient wants to know if she could get a different muscle relaxer.

## 2018-05-22 ENCOUNTER — Ambulatory Visit
Admission: EM | Admit: 2018-05-22 | Discharge: 2018-05-22 | Disposition: A | Discharge status: Home / Self Care | Payer: Medicare Other | Attending: Family Medicine | Admitting: Family Medicine

## 2018-05-22 ENCOUNTER — Encounter: Payer: Self-pay | Admitting: Emergency Medicine

## 2018-05-22 ENCOUNTER — Other Ambulatory Visit: Payer: Self-pay

## 2018-05-22 DIAGNOSIS — H6012 Cellulitis of left external ear: Secondary | ICD-10-CM | POA: Diagnosis not present

## 2018-05-22 MED ORDER — MUPIROCIN 2 % EX OINT: TOPICAL_OINTMENT | CUTANEOUS | 0 refills | Status: DC

## 2018-05-22 MED ORDER — SULFAMETHOXAZOLE-TRIMETHOPRIM 800-160 MG PO TABS: 1.0000 | ORAL_TABLET | Freq: Two times a day (BID) | ORAL | 0 refills | Status: AC

## 2018-05-22 NOTE — ED Triage Notes (Signed)
Patient in today c/o a "bump" on her left ear that she noticed this morning. Patient denies pain or itching.

## 2018-05-22 NOTE — Discharge Instructions (Addendum)
Take medication as prescribed. Rest. Apply ice.   Follow up with your primary care physician this week as needed. Return to Urgent care for new or worsening concerns.

## 2018-05-22 NOTE — ED Provider Notes (Signed)
[facility] ____________________________________________  Time seen: Approximately 3:29 PM  I have reviewed the triage vital signs and the nursing notes.   HISTORY  Chief Complaint bump on ear (left)  HPI Natalie Rose is a 37 y.o. female presenting for evaluation of red area to left outer ear that she noticed this morning.  Denies any known injury, trauma, insect bite, drainage.  States area is minimally tender.  Does not itch.  Denies history of similar.  No history of MRSA or recurrent skin infection.  States minimal nasal congestion.  States no real accompanying sickness.  No recent cough, fevers, facial swelling, hearing change, drainage or other complaints.  No alleviating measures attempted.  Denies aggravating factors.  Reports otherwise doing well denies other complaints.     Past Medical History:  Diagnosis Date  . Anxiety   . Arthritis   . Depression   . Mental health disorder   . Migraines     There are no active problems to display for this patient.   Past Surgical History:  Procedure Laterality Date  . MOUTH SURGERY       No current facility-administered medications for this encounter.   Current Outpatient Medications:  .  FLUoxetine (PROZAC) 40 MG capsule, Take 40 mg by mouth daily., Disp: , Rfl:  .  naproxen (NAPROSYN) 500 MG tablet, Take 1 tablet (500 mg total) by mouth 2 (two) times daily., Disp: 20 tablet, Rfl: 0 .  mupirocin ointment (BACTROBAN) 2 %, Apply two times a day for 7 days., Disp: 22 g, Rfl: 0 .  sulfamethoxazole-trimethoprim (BACTRIM DS,SEPTRA DS) 800-160 MG tablet, Take 1 tablet by mouth 2 (two) times daily for 7 days., Disp: 14 tablet, Rfl: 0  Allergies Valium [diazepam]  Family History  Problem Relation Age of Onset  . Osteopenia Mother   . Heart failure Mother   . Osteoarthritis Mother   . Depression Mother   . Diabetes Father     Social History Social History   Tobacco Use  . Smoking status: Current Every  Day Smoker    Packs/day: 1.00    Types: Cigarettes  . Smokeless tobacco: Never Used  Substance Use Topics  . Alcohol use: No  . Drug use: Yes    Frequency: 3.0 times per week    Types: Marijuana    Comment: last use ~05/18/18    Review of Systems Constitutional: No fever ENT: No sore throat. Cardiovascular: Denies chest pain. Respiratory: Denies shortness of breath. Gastrointestinal: No abdominal pain.   Skin: as above.   ____________________________________________   PHYSICAL EXAM:  VITAL SIGNS: ED Triage Vitals  Enc Vitals Group     BP 05/22/18 1416 110/80     Pulse Rate 05/22/18 1416 80     Resp 05/22/18 1416 16     Temp 05/22/18 1416 (!) 97.4 F (36.3 C)     Temp Source 05/22/18 1416 Oral     SpO2 05/22/18 1416 100 %     Weight 05/22/18 1416 171 lb (77.6 kg)     Height 05/22/18 1416 4\' 11"  (1.499 m)     Head Circumference --      Peak Flow --      Pain Score 05/22/18 1415 0     Pain Loc --      Pain Edu? --      Excl. in GC? --    Constitutional: Alert and oriented. Well appearing and in no acute distress. Eyes: Conjunctivae are normal.  Head: Atraumatic. No  sinus tenderness to palpation. No swelling. No erythema.  Ears:  Left: Left external ear at tragus and medial helix area of erythema with slight edema, skin intact, no induration, no drainage.  Canal normal, no inner erythema, TM appears normal.  No mastoid tenderness bilaterally.  Right: Nontender, normal canal, no erythema, normal TM.  No surrounding tenderness or skin changes noted.  Mouth/Throat: Mucous membranes are moist. No pharyngeal erythema. No tonsillar swelling or exudate.  Neck: No stridor.  No cervical spine tenderness to palpation. Hematological/Lymphatic/Immunilogical: No cervical lymphadenopathy. Cardiovascular: Normal rate, regular rhythm. Grossly normal heart sounds.  Good peripheral circulation. Respiratory: Normal respiratory effort.  No retractions. No wheezes, rales or rhonchi. Good  air movement.  Musculoskeletal: Ambulatory with steady gait. Neurologic:  Normal speech and language. No gait instability. Skin:  Skin appears warm, dry. Psychiatric: Mood and affect are normal. Speech and behavior are normal. ___________________________________________   LABS (all labs ordered are listed, but only abnormal results are displayed)  Labs Reviewed - No data to display  PROCEDURES Procedures    INITIAL IMPRESSION / ASSESSMENT AND PLAN / ED COURSE  Pertinent labs & imaging results that were available during my care of the patient were reviewed by me and considered in my medical decision making (see chart for details).  Well-appearing patient.  No acute distress.  Left ear area of irritation with erythema, concern for mild cellulitis.  No appearance of break in skin.  Will treat with topical Bactroban, over-the-counter ibuprofen and ice as needed.  Rx hardcopy of Bactrim to start in 2 to 3 days if no improvement or if increasing redness.  Discussed sooner return parameters.  Supportive care.Discussed indication, risks and benefits of medications with patient.  Discussed follow up with Primary care physician this week as needed. Discussed follow up and return parameters including no resolution or any worsening concerns. Patient verbalized understanding and agreed to plan.   ____________________________________________   FINAL CLINICAL IMPRESSION(S) / ED DIAGNOSES  Final diagnoses:  Cellulitis of left ear     ED Discharge Orders         Ordered    mupirocin ointment (BACTROBAN) 2 %     05/22/18 1449    sulfamethoxazole-trimethoprim (BACTRIM DS,SEPTRA DS) 800-160 MG tablet  2 times daily     05/22/18 1449           Note: This dictation was prepared with Dragon dictation along with smaller phrase technology. Any transcriptional errors that result from this process are unintentional.         Renford DillsMiller, Zilah Villaflor, NP 05/22/18 1600

## 2018-06-15 ENCOUNTER — Encounter: Payer: Self-pay | Admitting: Emergency Medicine

## 2018-06-15 ENCOUNTER — Emergency Department
Admission: EM | Admit: 2018-06-15 | Discharge: 2018-06-16 | Disposition: A | Discharge status: Other Acute Inpatient Hospital | Payer: Medicare Other | Source: Home / Self Care | Attending: Emergency Medicine | Admitting: Emergency Medicine

## 2018-06-15 ENCOUNTER — Other Ambulatory Visit: Payer: Self-pay

## 2018-06-15 DIAGNOSIS — F17210 Nicotine dependence, cigarettes, uncomplicated: Secondary | ICD-10-CM

## 2018-06-15 DIAGNOSIS — R45851 Suicidal ideations: Secondary | ICD-10-CM

## 2018-06-15 DIAGNOSIS — Z9189 Other specified personal risk factors, not elsewhere classified: Secondary | ICD-10-CM

## 2018-06-15 DIAGNOSIS — Z79899 Other long term (current) drug therapy: Secondary | ICD-10-CM

## 2018-06-15 DIAGNOSIS — F419 Anxiety disorder, unspecified: Secondary | ICD-10-CM

## 2018-06-15 DIAGNOSIS — F319 Bipolar disorder, unspecified: Secondary | ICD-10-CM | POA: Insufficient documentation

## 2018-06-15 DIAGNOSIS — F332 Major depressive disorder, recurrent severe without psychotic features: Secondary | ICD-10-CM | POA: Diagnosis not present

## 2018-06-15 DIAGNOSIS — F4310 Post-traumatic stress disorder, unspecified: Secondary | ICD-10-CM | POA: Diagnosis present

## 2018-06-15 DIAGNOSIS — F32A Depression, unspecified: Secondary | ICD-10-CM

## 2018-06-15 DIAGNOSIS — F329 Major depressive disorder, single episode, unspecified: Secondary | ICD-10-CM | POA: Diagnosis present

## 2018-06-15 DIAGNOSIS — F603 Borderline personality disorder: Secondary | ICD-10-CM | POA: Diagnosis present

## 2018-06-15 LAB — COMPREHENSIVE METABOLIC PANEL
ALK PHOS: 80 U/L (ref 38–126)
ALT: 21 U/L (ref 0–44)
AST: 21 U/L (ref 15–41)
Albumin: 4.5 g/dL (ref 3.5–5.0)
Anion gap: 9 (ref 5–15)
BUN: 20 mg/dL (ref 6–20)
CHLORIDE: 108 mmol/L (ref 98–111)
CO2: 24 mmol/L (ref 22–32)
Calcium: 9 mg/dL (ref 8.9–10.3)
Creatinine, Ser: 0.74 mg/dL (ref 0.44–1.00)
GFR calc Af Amer: >60 mL/min (ref >60)
GFR calc non Af Amer: >60 mL/min (ref >60)
GLUCOSE: 98 mg/dL (ref 70–99)
POTASSIUM: 3.1 mmol/L — AB (ref 3.5–5.1)
SODIUM: 141 mmol/L (ref 135–145)
Total Bilirubin: 0.6 mg/dL (ref 0.3–1.2)
Total Protein: 8 g/dL (ref 6.5–8.1)

## 2018-06-15 LAB — URINE DRUG SCREEN, QUALITATIVE (ARMC ONLY)
Amphetamines, Ur Screen: NOT DETECTED
BENZODIAZEPINE, UR SCRN: NOT DETECTED
Barbiturates, Ur Screen: NOT DETECTED
Cannabinoid 50 Ng, Ur ~~LOC~~: POSITIVE — AB
Cocaine Metabolite,Ur ~~LOC~~: NOT DETECTED
MDMA (Ecstasy)Ur Screen: NOT DETECTED
Methadone Scn, Ur: NOT DETECTED
Opiate, Ur Screen: NOT DETECTED
Phencyclidine (PCP) Ur S: NOT DETECTED
TRICYCLIC, UR SCREEN: POSITIVE — AB

## 2018-06-15 LAB — CBC
HCT: 40.8 % (ref 36.0–46.0)
Hemoglobin: 13.1 g/dL (ref 12.0–15.0)
MCH: 27.6 pg (ref 26.0–34.0)
MCHC: 32.1 g/dL (ref 30.0–36.0)
MCV: 85.9 fL (ref 80.0–100.0)
Platelets: 359 10*3/uL (ref 150–400)
RBC: 4.75 MIL/uL (ref 3.87–5.11)
RDW: 13.9 % (ref 11.5–15.5)
WBC: 11 10*3/uL — ABNORMAL HIGH (ref 4.0–10.5)
nRBC: 0 % (ref 0.0–0.2)

## 2018-06-15 LAB — ETHANOL

## 2018-06-15 LAB — POCT PREGNANCY, URINE: Preg Test, Ur: NEGATIVE

## 2018-06-15 LAB — SALICYLATE LEVEL: Salicylate Lvl: <7 mg/dL (ref 2.8–30.0)

## 2018-06-15 LAB — ACETAMINOPHEN LEVEL: Acetaminophen (Tylenol), Serum: <10 ug/mL — ABNORMAL LOW (ref 10–30)

## 2018-06-15 NOTE — ED Notes (Signed)
Pt belongings placed in bag-cell phone, sneakers, khaki pants, underwear, gray zip up hoodie jacket; gray colored necklace with gray heart pendant with [place] in the middle

## 2018-06-15 NOTE — ED Provider Notes (Signed)
[facility] Emergency Department Provider Note   ____________________________________________   First MD Initiated Contact with Patient 06/15/18 2003     (approximate)  I have reviewed the triage vital signs and the nursing notes.   HISTORY  Chief Complaint Depression and Mental Health Problem   HPI Natalie Rose is a 38 y.o. female patient reports she is depressed.  She has been cutting on her left forearm.  She has a history of depression in the past as well.  Vomiting fever chills chest or belly pain or any other complaints.   Past Medical History:  Diagnosis Date  . Anxiety   . Arthritis   . Depression   . Mental health disorder   . Migraines     There are no active problems to display for this patient.   Past Surgical History:  Procedure Laterality Date  . MOUTH SURGERY      Prior to Admission medications   Medication Sig Start Date End Date Taking? Authorizing Provider  FLUoxetine (PROZAC) 40 MG capsule Take 40 mg by mouth daily.    [provider]  mupirocin ointment (BACTROBAN) 2 % Apply two times a day for 7 days. 05/22/18   Renford Dills, NP  naproxen (NAPROSYN) 500 MG tablet Take 1 tablet (500 mg total) by mouth 2 (two) times daily. 09/01/17   Domenick Gong, MD    Allergies Valium [diazepam]  Family History  Problem Relation Age of Onset  . Osteopenia Mother   . Heart failure Mother   . Osteoarthritis Mother   . Depression Mother   . Diabetes Father     Social History Social History   Tobacco Use  . Smoking status: Current Every Day Smoker    Packs/day: 1.00    Types: Cigarettes  . Smokeless tobacco: Never Used  Substance Use Topics  . Alcohol use: Yes    Comment: "just a couple of sips of vodka"  . Drug use: Yes    Frequency: 3.0 times per week    Types: Marijuana    Comment: last use 06/14/18    Review of Systems  Constitutional: No fever/chills Eyes: No visual changes. ENT: No sore  throat. Cardiovascular: Denies chest pain. Respiratory: Denies shortness of breath. Gastrointestinal: No abdominal pain.  No nausea, no vomiting.  No diarrhea.  No constipation. Genitourinary: Negative for dysuria. Musculoskeletal: Negative for back pain. Skin: Negative for rash. Neurological: Negative for headaches, focal weakness  ____________________________________________   PHYSICAL EXAM:  VITAL SIGNS: ED Triage Vitals  Enc Vitals Group     BP 06/15/18 1920 117/73     Pulse Rate 06/15/18 1920 94     Resp 06/15/18 1920 17     Temp 06/15/18 1920 (!) 97.5 F (36.4 C)     Temp Source 06/15/18 1920 Oral     SpO2 06/15/18 1920 97 %     Weight 06/15/18 1923 170 lb (77.1 kg)     Height 06/15/18 1923 4\' 11"  (1.499 m)     Head Circumference --      Peak Flow --      Pain Score 06/15/18 1923 0     Pain Loc --      Pain Edu? --      Excl. in GC? --     Constitutional: Alert and oriented.  Looks depressed Eyes: Conjunctivae are normal.  Head: Atraumatic. Nose: No congestion/rhinnorhea. Mouth/Throat: Mucous membranes are moist.  Oropharynx non-erythematous. Neck: No stridor.   Cardiovascular: Normal rate, regular  rhythm. Grossly normal heart sounds.  Good peripheral circulation. Respiratory: Normal respiratory effort.  No retractions. Lungs CTAB. Gastrointestinal: Soft and nontender. No distention. No abdominal bruits. No CVA tenderness. Musculoskeletal: No lower extremity tenderness nor edema.   Neurologic:  Normal speech and language. No gross focal neurologic deficits are appreciated. No gait instability. Skin:  Skin is warm, dry multiple superficial abrasions on left forearm   ____________________________________________   LABS (all labs ordered are listed, but only abnormal results are displayed)  Labs Reviewed  COMPREHENSIVE METABOLIC PANEL - Abnormal; Notable for the following components:      Result Value   Potassium 3.1 (*)    All other components within normal  limits  ACETAMINOPHEN LEVEL - Abnormal; Notable for the following components:   Acetaminophen (Tylenol), Serum <10 (*)    All other components within normal limits  CBC - Abnormal; Notable for the following components:   WBC 11.0 (*)    All other components within normal limits  ETHANOL  SALICYLATE LEVEL  URINE DRUG SCREEN, QUALITATIVE ([facility] ONLY)  POCT PREGNANCY, URINE  POC URINE PREG, ED   ____________________________________________  EKG   ____________________________________________  RADIOLOGY  ED MD interpretation:  Official radiology report(s): No results found.  ____________________________________________   PROCEDURES  Procedure(s) performed:   Procedures  Critical Care performed:   ____________________________________________   INITIAL IMPRESSION / ASSESSMENT AND PLAN / ED COURSE           ____________________________________________   FINAL CLINICAL IMPRESSION(S) / ED DIAGNOSES  Final diagnoses:  Depression, unspecified depression type     ED Discharge Orders    None       Note:  This document was prepared using Dragon voice recognition software and may include unintentional dictation errors.    Arnaldo Natal, MD 06/15/18 2053

## 2018-06-15 NOTE — ED Triage Notes (Signed)
Pt brought to ED by [facility] officer Kimrey, IVC papers in hand, for mental health evaluation; papers say pt is suicidal and a danger to herself; pt says she is depressed but not suicidal; multiple superficial lacerations to left forearm; pt tearful; says she's under a lot of stress at home

## 2018-06-16 ENCOUNTER — Other Ambulatory Visit: Payer: Self-pay

## 2018-06-16 ENCOUNTER — Inpatient Hospital Stay
Admission: AD | Admit: 2018-06-16 | Discharge: 2018-06-17 | DRG: 885 | Disposition: A | Discharge status: Home / Self Care | Payer: Medicare Other | Source: Intra-hospital | Attending: Psychiatry | Admitting: Psychiatry

## 2018-06-16 ENCOUNTER — Encounter: Payer: Self-pay | Admitting: *Deleted

## 2018-06-16 DIAGNOSIS — F322 Major depressive disorder, single episode, severe without psychotic features: Secondary | ICD-10-CM

## 2018-06-16 DIAGNOSIS — F4310 Post-traumatic stress disorder, unspecified: Secondary | ICD-10-CM | POA: Diagnosis present

## 2018-06-16 DIAGNOSIS — Z8249 Family history of ischemic heart disease and other diseases of the circulatory system: Secondary | ICD-10-CM | POA: Diagnosis not present

## 2018-06-16 DIAGNOSIS — F332 Major depressive disorder, recurrent severe without psychotic features: Principal | ICD-10-CM | POA: Diagnosis present

## 2018-06-16 DIAGNOSIS — X789XXA Intentional self-harm by unspecified sharp object, initial encounter: Secondary | ICD-10-CM

## 2018-06-16 DIAGNOSIS — Z9189 Other specified personal risk factors, not elsewhere classified: Secondary | ICD-10-CM

## 2018-06-16 DIAGNOSIS — Z833 Family history of diabetes mellitus: Secondary | ICD-10-CM | POA: Diagnosis not present

## 2018-06-16 DIAGNOSIS — S61519A Laceration without foreign body of unspecified wrist, initial encounter: Secondary | ICD-10-CM

## 2018-06-16 DIAGNOSIS — R45851 Suicidal ideations: Secondary | ICD-10-CM

## 2018-06-16 DIAGNOSIS — F17210 Nicotine dependence, cigarettes, uncomplicated: Secondary | ICD-10-CM | POA: Diagnosis present

## 2018-06-16 DIAGNOSIS — Z79899 Other long term (current) drug therapy: Secondary | ICD-10-CM | POA: Diagnosis not present

## 2018-06-16 DIAGNOSIS — F603 Borderline personality disorder: Secondary | ICD-10-CM | POA: Diagnosis present

## 2018-06-16 DIAGNOSIS — F410 Panic disorder [episodic paroxysmal anxiety] without agoraphobia: Secondary | ICD-10-CM | POA: Diagnosis present

## 2018-06-16 DIAGNOSIS — Z915 Personal history of self-harm: Secondary | ICD-10-CM | POA: Diagnosis not present

## 2018-06-16 DIAGNOSIS — Z818 Family history of other mental and behavioral disorders: Secondary | ICD-10-CM | POA: Diagnosis not present

## 2018-06-16 DIAGNOSIS — F1210 Cannabis abuse, uncomplicated: Secondary | ICD-10-CM | POA: Diagnosis present

## 2018-06-16 DIAGNOSIS — F329 Major depressive disorder, single episode, unspecified: Secondary | ICD-10-CM | POA: Diagnosis not present

## 2018-06-16 MED ORDER — ACETAMINOPHEN 325 MG PO TABS: 650.0000 mg | ORAL_TABLET | Freq: Four times a day (QID) | ORAL | Status: DC | PRN

## 2018-06-16 MED ORDER — FLUOXETINE HCL 20 MG PO CAPS
60.0000 mg | ORAL_CAPSULE | Freq: Every day | ORAL | Status: DC
  Administered 2018-06-17: 60 mg via ORAL
  Filled 2018-06-16: qty 3

## 2018-06-16 MED ORDER — MAGNESIUM HYDROXIDE 400 MG/5ML PO SUSP: 30.0000 mL | Freq: Every day | ORAL | Status: DC | PRN

## 2018-06-16 MED ORDER — FLUOXETINE HCL 20 MG PO CAPS
60.0000 mg | ORAL_CAPSULE | Freq: Every day | ORAL | Status: DC
  Administered 2018-06-16: 60 mg via ORAL
  Filled 2018-06-16: qty 3

## 2018-06-16 MED ORDER — HYDROXYZINE HCL 25 MG PO TABS
25.0000 mg | ORAL_TABLET | ORAL | Status: DC | PRN
  Administered 2018-06-16: 25 mg via ORAL
  Filled 2018-06-16: qty 1

## 2018-06-16 MED ORDER — NAPROXEN 500 MG PO TABS
500.0000 mg | ORAL_TABLET | Freq: Two times a day (BID) | ORAL | Status: DC | PRN
  Filled 2018-06-16: qty 1

## 2018-06-16 MED ORDER — ALUM & MAG HYDROXIDE-SIMETH 200-200-20 MG/5ML PO SUSP: 30.0000 mL | ORAL | Status: DC | PRN

## 2018-06-16 NOTE — ED Notes (Signed)
Pt given breakfast.

## 2018-06-16 NOTE — [facility] Group Notes (Signed)
[facility] Group Notes:  (Nursing/MHT/Case Management/Adjunct)  Date:  06/16/2018  Time:  9:58 PM  Type of Therapy:  Group Therapy  Participation Level:  Active  Participation Quality:  Appropriate  Affect:  Appropriate  Cognitive:  Alert  Insight:  Good  Engagement in Group:  Engaged  Modes of Intervention:  Support  Summary of Progress/Problems:  Natalie Rose 06/16/2018, 9:58 PM

## 2018-06-16 NOTE — BH Assessment (Signed)
Assessment Note  Natalie Rose is an 38 y.o. female. self-reports a history of bipolar disorder and borderline personality disorder. She stated that on today she was on the phone with a friend and began to cut, which she does to inflict pain. Per her report the friend was concerned and called the police to complete a safety check on her. Patient reports that she currently participates in outpatient therapy at [facility]. She states that she takes Prozac which has recently been increased from 40mg  to 60mg . She shares that she is compliant with this. Pts grandmother passed 29 years ago and this is her  grandmothers anniversary Apparently this has triggered her depressive symptoms. Pt. denies any suicidal ideation, plan or intent. Pt. denies the presence of any auditory or visual hallucinations at this time. Patient denies any other medical complaints. Patients contracted for safety outside the hospital system. She admits it to previous suicide attempts although the most recent with over three years ago.  Diagnosis: Bipolar Disorder  Past Medical History:  Past Medical History:  Diagnosis Date  . Anxiety   . Arthritis   . Depression   . Mental health disorder   . Migraines     Past Surgical History:  Procedure Laterality Date  . MOUTH SURGERY      Family History:  Family History  Problem Relation Age of Onset  . Osteopenia Mother   . Heart failure Mother   . Osteoarthritis Mother   . Depression Mother   . Diabetes Father     Social History:  reports that she has been smoking cigarettes. She has been smoking about 1.00 pack per day. She has never used smokeless tobacco. She reports current alcohol use. She reports current drug use. Frequency: 3.00 times per week. Drug: Marijuana.  Additional Social History:  Alcohol / Drug Use Pain Medications: SEE MAR Prescriptions: SEE MAR Over the Counter: SEE MAR History of alcohol / drug use?: Yes Longest period of sobriety  (when/how long): Ongoing  Substance #1 Name of Substance 1: THC 1 - Age of First Use: Teens 1 - Amount (size/oz): varies 1 - Frequency: Daily  1 - Duration: ongoing  1 - Last Use / Amount: Yestereday  CIWA: CIWA-Ar BP: 117/73 Pulse Rate: 94 COWS:    Allergies:  Allergies  Allergen Reactions  . Valium [Diazepam] Hives    Home Medications: (Not in a hospital admission)   OB/GYN Status:  Patient's last menstrual period was 06/09/2018 (approximate).  General Assessment Data TTS Assessment: In system Is this a Tele or Face-to-Face Assessment?: Face-to-Face Is this an Initial Assessment or a Re-assessment for this encounter?: Initial Assessment Patient Accompanied by:: N/A Language Other than English: No Living Arrangements: Other (Comment) What gender do you identify as?: Female Living Arrangements: Alone Can pt return to current living arrangement?: Yes Admission Status: Involuntary Is patient capable of signing voluntary admission?: No Referral Source: Self/Family/Friend Insurance type: UHC/Medicaid   Medical Screening Exam [facility] Walk-in ONLY) Medical Exam completed: Yes  Crisis Care Plan Living Arrangements: Alone Legal Guardian: Other:(none ) Name of Psychiatrist: [facility] Name of Therapist: none  Education Status Is patient currently in school?: No Is the patient employed, unemployed or receiving disability?: Unemployed  Risk to self with the past 6 months Suicidal Ideation: No Has patient been a risk to self within the past 6 months prior to admission? : No Suicidal Intent: No Has patient had any suicidal intent within the past 6 months prior to admission? : No Is  patient at risk for suicide?: No, but patient needs Medical Clearance Suicidal Plan?: No Has patient had any suicidal plan within the past 6 months prior to admission? : No Access to Means: No What has been your use of drugs/alcohol within the last 12 months?: n Previous Attempts/Gestures: No How  many times?: 2 Other Self Harm Risks: cutting  Triggers for Past Attempts: Unknown Intentional Self Injurious Behavior: Cutting Comment - Self Injurious Behavior: cutting Family Suicide History: No Recent stressful life event(s): Conflict (Comment) Persecutory voices/beliefs?: No Depression: Yes Depression Symptoms: Feeling worthless/self pity, Loss of interest in usual pleasures, Isolating, Tearfulness Substance abuse history and/or treatment for substance abuse?: Yes([facility]) Suicide prevention information given to non-admitted patients: Not applicable  Risk to Others within the past 6 months Homicidal Ideation: No Does patient have any lifetime risk of violence toward others beyond the six months prior to admission? : No Thoughts of Harm to Others: No Current Homicidal Intent: No Current Homicidal Plan: No Access to Homicidal Means: No Identified Victim: None History of harm to others?: No Assessment of Violence: None Noted Does patient have access to weapons?: No Criminal Charges Pending?: No Does patient have a court date: No Is patient on probation?: No  Psychosis Hallucinations: None noted Delusions: None noted  Mental Status Report Appearance/Hygiene: In scrubs Eye Contact: Poor Motor Activity: Freedom of movement Speech: Soft Level of Consciousness: Alert Mood: Anxious Affect: Anxious Anxiety Level: Minimal Thought Processes: Relevant Judgement: Partial Orientation: Situation, Time, Place, Person Obsessive Compulsive Thoughts/Behaviors: None  Cognitive Functioning Concentration: Fair Memory: Remote Intact, Recent Intact Is patient IDD: No Insight: see judgement above Impulse Control: Fair Appetite: Fair Have you had any weight changes? : No Change Sleep: No Change Total Hours of Sleep: 6 Vegetative Symptoms: None  ADLScreening [facility] Assessment Services) Patient's cognitive ability adequate to safely complete daily activities?: Yes Patient able to  express need for assistance with ADLs?: Yes Independently performs ADLs?: Yes (appropriate for developmental age)  Prior Inpatient Therapy Prior Inpatient Therapy: Yes Prior Therapy Dates: Several  Prior Therapy Facilty/Provider(s): [facility]  Reason for Treatment: Bipolar and BPD  Prior Outpatient Therapy Prior Outpatient Therapy: Yes Prior Therapy Dates: Current  Prior Therapy Facilty/Provider(s): [facility] Reason for Treatment: Bipolar and BPD Does patient have an ACCT team?: No Does patient have Intensive In-House Services?  : No Does patient have [facility] services? : No Does patient have P4CC services?: No  ADL Screening (condition at time of admission) Patient's cognitive ability adequate to safely complete daily activities?: Yes Patient able to express need for assistance with ADLs?: Yes Independently performs ADLs?: Yes (appropriate for developmental age)       Abuse/Neglect Assessment (Assessment to be complete while patient is alone) Abuse/Neglect Assessment Can Be Completed: Yes Physical Abuse: Denies Verbal Abuse: Denies Sexual Abuse: Denies Exploitation of patient/patient's resources: Denies Self-Neglect: Denies Values / Beliefs Cultural Requests During Hospitalization: None Consults Spiritual Care Consult Needed: No Social Work Consult Needed: No Merchant navy officer (For Healthcare) Does Patient Have a Medical Advance Directive?: No Would patient like information on creating a medical advance directive?: No - Patient declined          Disposition:  Disposition Initial Assessment Completed for this Encounter: Yes Patient referred to: Other (Comment)(Consult with Psych MD)  On Site Evaluation by:   Reviewed with Physician:    Asa Saunas 06/16/2018 6:30 AM

## 2018-06-16 NOTE — ED Notes (Signed)
TTS, Joni Reining, at bedside.

## 2018-06-16 NOTE — ED Notes (Signed)
Pt awake and moving normally, pt reports unable to talk about last night "I don't remember", pt cutting self for distraction d/t being sad about grandmothers passing, grandmothers's birthday was yesterday but died in 73, pt reports hx of bipolar and depression, knee pain, recent change of meds from 40 to 60mg  Prozac daily, occasional use of naproxen for knee pain,   Pt denies SI/HI/VH/AH, reports wants to get home to take care of pets   Reports using knife to cut left anterior forearm

## 2018-06-16 NOTE — Tx Team (Signed)
Initial Treatment Plan 06/16/2018 6:28 PM Natalie Rose [id number]    PATIENT STRESSORS: Health problems Medication change or noncompliance Substance abuse Other: Grief   PATIENT STRENGTHS: Ability for insight Active sense of humor Capable of independent living Communication skills   PATIENT IDENTIFIED PROBLEMS: Patient is a cutter 06/16/2018  Depression  06/16/2018  Suicidal  1/ 13/ 2020  Smokes  Pot  06/16/2018               DISCHARGE CRITERIA:  Ability to meet basic life and health needs Improved stabilization in mood, thinking, and/or behavior  PRELIMINARY DISCHARGE PLAN: Outpatient therapy Return to previous living arrangement  PATIENT/FAMILY INVOLVEMENT: This treatment plan has been presented to and reviewed with the patient, Natalie Rose, and/or family member,  The patient and family have been given the opportunity to ask questions and make suggestions.  Crist Infante, RN 06/16/2018, 6:28 PM

## 2018-06-16 NOTE — ED Notes (Signed)
Pt is crying, wanting to go home.  Pt is anxious and states it is because she is confined to one room.  Pt denies SI.  Pt is having difficulty with the death of her grandmother (passed in 74).  Recent medication increase of her antidepressant.  Pt refusing to take any medication to help her relax.   Maintained on 15 minute checks and observation by security for safety.

## 2018-06-16 NOTE — ED Notes (Signed)
Pt under IVC with LE, severe psychomotor retardation, poor historian, cries at any interaction, superficial lacs to left arm, skin slightly broken, bleeding (if any) controlled
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
## 2018-06-16 NOTE — BH Assessment (Signed)
Patient is to be admitted to [facility] by Dr. Viviano Simas.  Attending Physician will be Dr. Jennet Maduro.   Patient has been assigned to room 309, by [facility] Charge Nurse Lillette Boxer   ER staff is aware of the admission:  Davy Pique, ER Secretary    Dr. Roxan Hockey, ER MD   Amy B., Patient's Nurse   Lupita Dawn., Patient Access.

## 2018-06-16 NOTE — Plan of Care (Signed)
  Problem: Education: Goal: Knowledge of [facility] Education information/materials will improve Note:  Patient   verbalize understanding of Education  presented . Instructed on unit programing  and hand washing

## 2018-06-16 NOTE — ED Notes (Signed)
Pt standing in her doorway. Pt continues to be anxious and tearful, asking repeatedly when she will be able to go home.  Awaiting disposition.   Maintained on 15 minute checks and observation by security  for safety.
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
## 2018-06-16 NOTE — Tx Team (Signed)
Initial Treatment Plan 06/16/2018 6:52 PM [place] Q[facility]:[id number]    PATIENT STRESSORS: Medication change or noncompliance Traumatic event   PATIENT STRENGTHS: Capable of independent living Communication skills Motivation for treatment/growth   PATIENT IDENTIFIED PROBLEMS: PTSD  Depression                   DISCHARGE CRITERIA:  Ability to meet basic life and health needs Medical problems require only outpatient monitoring  PRELIMINARY DISCHARGE PLAN: Attend aftercare/continuing care group Return to previous living arrangement  PATIENT/FAMILY INVOLVEMENT: This treatment plan has been presented to and reviewed with the patient, [place], and/or family member, The patient and family have been given the opportunity to ask questions and make suggestions.  Leonarda Salon, RN 06/16/2018, 6:52 PM

## 2018-06-16 NOTE — ED Notes (Signed)
Pt discharged under IVC to BMU. VS stable. Belongings sent with patient. Report called to [place], Charity fundraiser.

## 2018-06-16 NOTE — BH Assessment (Signed)
Per the request of Psych MD (Dr. Viviano Simas), writer attempted to get patient outpatient appointment with her former counselor Tana Conch) with [facility]. However, they state she will need to come in as walk-in (Mon-Fri. 8:30am-to-3:00pm to get started back with services. She was last seen with them on 11/2016.   Writer updated Psych MD.

## 2018-06-17 DIAGNOSIS — F332 Major depressive disorder, recurrent severe without psychotic features: Principal | ICD-10-CM

## 2018-06-17 DIAGNOSIS — S61519A Laceration without foreign body of unspecified wrist, initial encounter: Secondary | ICD-10-CM

## 2018-06-17 DIAGNOSIS — X789XXA Intentional self-harm by unspecified sharp object, initial encounter: Secondary | ICD-10-CM

## 2018-06-17 DIAGNOSIS — F1210 Cannabis abuse, uncomplicated: Secondary | ICD-10-CM

## 2018-06-17 MED ORDER — HYDROXYZINE HCL 50 MG PO TABS
50.0000 mg | ORAL_TABLET | Freq: Once | ORAL | Status: AC
  Administered 2018-06-17: 50 mg via ORAL
  Filled 2018-06-17: qty 1
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
## 2018-06-17 NOTE — Progress Notes (Signed)
Recreation Therapy Notes   Date: 06/17/2018  Time: 9:30 am  Location: Craft Room  Behavioral response: Appropriate  Intervention Topic: Goals  Discussion/Intervention:  Group content on today was focused on goals. Patients described what goals are and how they define goals. Individuals expressed how they go about setting goals and reaching them. The group identified how important goals are and if they make short term goals to reach long term goals. Patients described how many goals they work on at a time and what affects them not reaching their goal. Individuals described how much time they put into planning and obtaining their goals. The group participated in the intervention "My Goal Board" and made personal goal boards to help them achieve their goal. Clinical Observations/Feedback:  Patient came to group and defined goals as something in reach. She explained when she makes goals for herself she likes to take a step back and where she is in her life and make a smart goal. Individual was social with peers and staff while participating in the intervention. Glady Ouderkirk LRT/CTRS         Romain Erion 06/17/2018 11:26 AM

## 2018-06-17 NOTE — BHH Suicide Risk Assessment (Signed)
[facility] Discharge Suicide Risk Assessment   Principal Problem: MDD (major depressive disorder), recurrent severe, without psychosis (HCC) Discharge Diagnoses: Principal Problem:   MDD (major depressive disorder), recurrent severe, without psychosis (HCC) Active Problems:   Borderline personality disorder (HCC)   PTSD (post-traumatic stress disorder)   Self-inflicted laceration of wrist   Cannabis abuse   Total Time spent with patient: 1 hour  Musculoskeletal: Strength & Muscle Tone: within normal limits Gait & Station: normal Patient leans: N/A  Psychiatric Specialty Exam: Review of Systems  Constitutional: Negative.   HENT: Negative.   Eyes: Negative.   Respiratory: Negative.   Cardiovascular: Negative.   Gastrointestinal: Negative.   Musculoskeletal: Negative.   Skin: Negative.   Neurological: Negative.   Psychiatric/Behavioral: Negative for depression, hallucinations, memory loss, substance abuse and suicidal ideas. The patient is nervous/anxious. The patient does not have insomnia.     Blood pressure 106/85, pulse 95, temperature 98.1 F (36.7 C), temperature source Oral, resp. rate 18, height 4\' 11"  (1.499 m), weight 74.4 kg, last menstrual period 06/09/2018, SpO2 100 %.Body mass index is 33.12 kg/m.  General Appearance: Casual  Eye Contact::  Good  Speech:  Clear and Coherent409  Volume:  Normal  Mood:  Anxious  Affect:  Congruent  Thought Process:  Goal Directed  Orientation:  Full (Time, Place, and Person)  Thought Content:  Logical  Suicidal Thoughts:  No  Homicidal Thoughts:  No  Memory:  Immediate;   Fair Recent;   Fair Remote;   Fair  Judgement:  Fair  Insight:  Fair  Psychomotor Activity:  Decreased  Concentration:  Good  Recall:  [employer] of Knowledge:Fair  Language: Fair  Akathisia:  No  Handed:  Right  AIMS (if indicated):     Assets:  Desire for Improvement Housing Physical Health Resilience Social Support  Sleep:  Number of Hours: 4.45   Cognition: WNL  ADL's:  Intact   Mental Status Per Nursing Assessment::   On Admission:  Self-harm behaviors  Demographic Factors:  Living alone  Loss Factors: Loss of significant relationship  Historical Factors: Prior suicide attempts and Anniversary of important loss  Risk Reduction Factors:   Sense of responsibility to family, Religious beliefs about death, Positive social support and Positive therapeutic relationship  Continued Clinical Symptoms:  Depression:   Impulsivity  Cognitive Features That Contribute To Risk:  Closed-mindedness    Suicide Risk:  Minimal: No identifiable suicidal ideation.  Patients presenting with no risk factors but with morbid ruminations; may be classified as minimal risk based on the severity of the depressive symptoms    Plan Of Care/Follow-up recommendations:  Activity:  Activity as tolerated Diet:  Regular diet Other:  Follow-up with outpatient mental health providers continue current medicine and plan.  Consider making more of an effort to get into see a therapist  Mordecai Rasmussen, MD 06/17/2018, 12:49 PM

## 2018-06-17 NOTE — BHH Group Notes (Signed)
Feelings Around Diagnosis 06/17/2018 1PM  Type of Therapy/Topic:  Group Therapy:  Feelings about Diagnosis  Participation Level:  Active   Description of Group:   This group will allow patients to explore their thoughts and feelings about diagnoses they have received. Patients will be guided to explore their level of understanding and acceptance of these diagnoses. Facilitator will encourage patients to process their thoughts and feelings about the reactions of others to their diagnosis and will guide patients in identifying ways to discuss their diagnosis with significant others in their lives. This group will be process-oriented, with patients participating in exploration of their own experiences, giving and receiving support, and processing challenge from other group members.   Therapeutic Goals: 1. Patient will demonstrate understanding of diagnosis as evidenced by identifying two or more symptoms of the disorder 2. Patient will be able to express two feelings regarding the diagnosis 3. Patient will demonstrate their ability to communicate their needs through discussion and/or role play  Summary of Patient Progress:  Actively and appropriately engaged in the group. Patient was able to provide support and validation to other group members.Patient practiced active listening when interacting with the facilitator and other group members. Patient discussed with group her hx of cutting and what led to this hospitalization. Patient identified healthy coping methods to help manage sxs of dx.     Therapeutic Modalities:   Cognitive Behavioral Therapy Brief Therapy Feelings Identification    Suzan Slick, LCSW 06/17/2018 2:00 PM
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
## 2018-06-17 NOTE — Progress Notes (Signed)
Patient ID: Natalie Rose, female   DOB: 1981/02/24, 38 y.o.   MRN: [id number]   Discharge Note:  Patient denies SI/HI/AVH at this time. Discharge instructions, AVS, and transition record gone over with patient. Patient agrees to comply with medication management, follow-up visit, and outpatient therapy. Patient belongings returned to patient. Patient questions and concerns addressed and answered. Patient ambulatory off unit. Patient discharged to home via [employer] services.

## 2018-06-17 NOTE — Discharge Summary (Signed)
Physician Discharge Summary Note  Patient:  Natalie Rose is an 38 y.o., female MRN:  [id number] DOB:  06/14/1980 Patient phone:  [phone] (home)  Patient address:   [address] [place] [id number],  Total Time spent with patient: 45 minutes  Date of Admission:  06/16/2018 Date of Discharge: June 17, 2018  Reason for Admission: Admitted through the emergency room because of self-mutilation  Principal Problem: MDD (major depressive disorder), recurrent severe, without psychosis (HCC) Discharge Diagnoses: Principal Problem:   MDD (major depressive disorder), recurrent severe, without psychosis (HCC) Active Problems:   Borderline personality disorder (HCC)   PTSD (post-traumatic stress disorder)   Self-inflicted laceration of wrist   Cannabis abuse   Past Psychiatric History: Past history of chronic anxiety and depression with prior hospitalizations.  Currently receiving outpatient medication management  Past Medical History:  Past Medical History:  Diagnosis Date  . Anxiety   . Arthritis   . Depression   . Mental health disorder   . Migraines     Past Surgical History:  Procedure Laterality Date  . MOUTH SURGERY     Family History:  Family History  Problem Relation Age of Onset  . Osteopenia Mother   . Heart failure Mother   . Osteoarthritis Mother   . Depression Mother   . Diabetes Father    Family Psychiatric  History: Positive for depression and suicide in a grandfather Social History:  Social History   Substance and Sexual Activity  Alcohol Use Yes   Comment: "just a couple of sips of vodka"     Social History   Substance and Sexual Activity  Drug Use Yes  . Frequency: 3.0 times per week  . Types: Marijuana   Comment: last use 06/14/18    Social History   Socioeconomic History  . Marital status: Single    Spouse name: Not on file  . Number of children: Not on file  . Years of education: Not on file  . Highest education  level: Not on file  Occupational History  . Not on file  Social Needs  . Financial resource strain: Not on file  . Food insecurity:    Worry: Not on file    Inability: Not on file  . Transportation needs:    Medical: Not on file    Non-medical: Not on file  Tobacco Use  . Smoking status: Current Every Day Smoker    Packs/day: 1.00    Types: Cigarettes  . Smokeless tobacco: Never Used  Substance and Sexual Activity  . Alcohol use: Yes    Comment: "just a couple of sips of vodka"  . Drug use: Yes    Frequency: 3.0 times per week    Types: Marijuana    Comment: last use 06/14/18  . Sexual activity: Yes    Birth control/protection: None  Lifestyle  . Physical activity:    Days per week: Not on file    Minutes per session: Not on file  . Stress: Not on file  Relationships  . Social connections:    Talks on phone: Not on file    Gets together: Not on file    Attends religious service: Not on file    Active member of club or organization: Not on file    Attends meetings of clubs or organizations: Not on file    Relationship status: Not on file  Other Topics Concern  . Not on file  Social History Narrative  . Not  on file    Hospital Course: Patient admitted to the hospital.  Very anxious on presentation.  Did not however engage in any dangerous or violent behavior.  Did not show psychotic symptoms.  Consistently denied suicidal ideation.  Patient was appropriate in interview today with social work and physician.  Denies suicidal wish or plan and states hospitalization is making her feel more nervous.  Requesting discharge.  Has appropriate outpatient care in the community.  Patient will be taken off of IV C and allowed to be discharged to follow-up with her usual medication management.  No new prescriptions required.  Physical Findings: AIMS: Facial and Oral Movements Muscles of Facial Expression: None, normal Lips and Perioral Area: None, normal Jaw: None, normal Tongue:  None, normal,Extremity Movements Upper (arms, wrists, hands, fingers): None, normal Lower (legs, knees, ankles, toes): None, normal, Trunk Movements Neck, shoulders, hips: None, normal, Overall Severity Severity of abnormal movements (highest score from questions above): None, normal Incapacitation due to abnormal movements: None, normal Patient's awareness of abnormal movements (rate only patient's report): No Awareness, Dental Status Current problems with teeth and/or dentures?: No Does patient usually wear dentures?: No  CIWA:    COWS:     Musculoskeletal: Strength & Muscle Tone: within normal limits Gait & Station: normal Patient leans: N/A  Psychiatric Specialty Exam: Physical Exam  Nursing note and vitals reviewed. Constitutional: She appears well-developed and well-nourished.  HENT:  Head: Normocephalic and atraumatic.  Eyes: Pupils are equal, round, and reactive to light. Conjunctivae are normal.  Neck: Normal range of motion.  Cardiovascular: Regular rhythm and normal heart sounds.  Respiratory: Effort normal. No respiratory distress.  GI: Soft.  Musculoskeletal: Normal range of motion.  Neurological: She is alert.  Skin: Skin is warm and dry.  Psychiatric: Her speech is normal and behavior is normal. Judgment and thought content normal. Her mood appears anxious. Cognition and memory are normal.    Review of Systems  Constitutional: Negative.   HENT: Negative.   Eyes: Negative.   Respiratory: Negative.   Cardiovascular: Negative.   Gastrointestinal: Negative.   Musculoskeletal: Negative.   Skin: Negative.   Neurological: Negative.   Psychiatric/Behavioral: Negative for depression and suicidal ideas. The patient is nervous/anxious.     Blood pressure 106/85, pulse 95, temperature 98.1 F (36.7 C), temperature source Oral, resp. rate 18, height 4\' 11"  (1.499 m), weight 74.4 kg, last menstrual period 06/09/2018, SpO2 100 %.Body mass index is 33.12 kg/m.  General  Appearance: Casual  Eye Contact:  Good  Speech:  Clear and Coherent  Volume:  Normal  Mood:  Anxious  Affect:  Appropriate  Thought Process:  Coherent  Orientation:  Full (Time, Place, and Person)  Thought Content:  Logical  Suicidal Thoughts:  No  Homicidal Thoughts:  No  Memory:  Immediate;   Fair Recent;   Fair Remote;   Fair  Judgement:  Fair  Insight:  Fair  Psychomotor Activity:  Normal  Concentration:  Concentration: Fair  Recall:  [employer] of Knowledge:  Fair  Language:  Fair  Akathisia:  No  Handed:  Right  AIMS (if indicated):     Assets:  Communication Skills Desire for Improvement Housing Leisure Time  ADL's:  Intact  Cognition:  WNL  Sleep:  Number of Hours: 4.45     Have you used any form of tobacco in the last 30 days? (Cigarettes, Smokeless Tobacco, Cigars, and/or Pipes): Yes  Has this patient used any form of tobacco in the last  30 days? (Cigarettes, Smokeless Tobacco, Cigars, and/or Pipes) Yes, No  Blood Alcohol level:  Lab Results  Component Value Date   ETH <10 06/15/2018    Metabolic Disorder Labs:  No results found for: HGBA1C, MPG No results found for: PROLACTIN No results found for: CHOL, TRIG, HDL, CHOLHDL, VLDL, LDLCALC  See Psychiatric Specialty Exam and Suicide Risk Assessment completed by Attending Physician prior to discharge.  Discharge destination:  Home  Is patient on multiple antipsychotic therapies at discharge:  No   Has Patient had three or more failed trials of antipsychotic monotherapy by history:  No  Recommended Plan for Multiple Antipsychotic Therapies: NA  Discharge Instructions    Diet - low sodium heart healthy   Complete by:  As directed    Increase activity slowly   Complete by:  As directed      Allergies as of 06/17/2018      Reactions   Valium [diazepam] Hives      Medication List    STOP taking these medications   mupirocin ointment 2 % Commonly known as:  BACTROBAN     TAKE these  medications     Indication  FLUoxetine 40 MG capsule Commonly known as:  PROZAC Take 40 mg by mouth daily.  Indication:  Depression   naproxen 500 MG tablet Commonly known as:  NAPROSYN Take 1 tablet (500 mg total) by mouth 2 (two) times daily.  Indication:  Pain        Follow-up recommendations:  Activity:  Activity as tolerated Diet:  No change to diet regular diet Other:  Follow-up with outpatient provider  Comments: Follow-up with Dr. Fannie Knee and consider getting into see a therapist.  Case reviewed with full treatment team  Signed: Mordecai Rasmussen, MD 06/17/2018, 1:01 PM

## 2018-06-17 NOTE — Progress Notes (Signed)
  [facility] Adult Case Management Discharge Plan :  Will you be returning to the same living situation after discharge:  Yes,  pt will be returning home. At discharge, do you have transportation home?: Yes,  CSW completed a taxi voucher. Do you have the ability to pay for your medications: Yes,  pt reports that she has Medicaid and Medicare.  Release of information consent forms completed and in the chart;  Patient's signature needed at discharge.  Patient to Follow up at: Follow-up Information    [facility] [place] Go on 07/10/2018.   Why:  Please attend appointment on 07/10/2018 at 2:40pm. Contact information: [address] [place] [address] [phone]        [facility] Follow up.   Specialty:  Behavioral Health Why:  Please walk in during walk in hours Monday-Friday 8:30a-3p.  Please fax/bring dishcarge paperwork.  Contact information: [address] [place] [address] [phone]          Next level of care provider has access to [facility] Link:no  Safety Planning and Suicide Prevention discussed: Yes,  SPE completed with the patient.  Patient declined family contact at this time.   Have you used any form of tobacco in the last 30 days? (Cigarettes, Smokeless Tobacco, Cigars, and/or Pipes): Yes  Has patient been referred to the Quitline?: Patient refused referral  Patient has been referred for addiction treatment: Pt. refused referral  Harden Mo, LCSW 06/17/2018, 1:11 PM

## 2018-06-17 NOTE — Progress Notes (Signed)
Patient projects as needy  And with crying spells none aggressive and no violent behaviors, attends groups with great participations , compliant with her meds. With out any side effects, contract for safety and sleep is continuous with out distress . 15 minute safety checks is maintained denies SI/HI/AVH.

## 2019-01-22 ENCOUNTER — Ambulatory Visit
Admission: EM | Admit: 2019-01-22 | Discharge: 2019-01-22 | Disposition: A | Discharge status: Home / Self Care | Payer: Medicare Other | Attending: Family Medicine | Admitting: Family Medicine

## 2019-01-22 ENCOUNTER — Encounter: Payer: Self-pay | Admitting: Emergency Medicine

## 2019-01-22 ENCOUNTER — Other Ambulatory Visit: Payer: Self-pay

## 2019-01-22 ENCOUNTER — Other Ambulatory Visit: Payer: Self-pay | Admitting: Family Medicine

## 2019-01-22 DIAGNOSIS — M79605 Pain in left leg: Secondary | ICD-10-CM

## 2019-01-22 DIAGNOSIS — M79604 Pain in right leg: Secondary | ICD-10-CM

## 2019-01-22 DIAGNOSIS — G2571 Drug induced akathisia: Secondary | ICD-10-CM | POA: Diagnosis not present

## 2019-01-22 MED ORDER — MELOXICAM 15 MG PO TABS: 15.0000 mg | ORAL_TABLET | Freq: Every day | ORAL | 0 refills | Status: DC | PRN

## 2019-01-22 NOTE — ED Triage Notes (Signed)
Patient c/o bilateral lower leg pain that started 1 week ago. Denies injury.

## 2019-01-22 NOTE — Discharge Instructions (Signed)
I believe this is an adverse effect of your medication.  Please call your doctor to discuss.  Mobic as needed for pain.  Take care  Dr. Lacinda Axon

## 2019-01-22 NOTE — ED Provider Notes (Signed)
[facility]    CSN: [id number] Arrival date & time: 01/22/19  1051 History   Chief Complaint Chief Complaint  Patient presents with  . Leg Pain   HPI  38 year old female presents with the above complaint.  Patient is also quite restless.  Patient reports that her symptoms started approximate 1 week ago.  She has recently started a new medication (Latuda).  Patient reports pain and restlessness particularly of her lower extremities.  She localizes the pain to the knees and anterior shin.  Patient states that she feels like she needs to be moving all the time.  She states that she seems to feel better when she is moving her legs.  Denies recent fall, trauma, injury.  No recent increase in activity.  She has taken naproxen without relief.  No known exacerbating factors.  No other reported symptoms.  No other complaints.  PMH, Surgical Hx, Family Hx, Social History reviewed and updated as below.  Past Medical History:  Diagnosis Date  . Anxiety   . Arthritis   . Depression   . Mental health disorder   . Migraines    Patient Active Problem List   Diagnosis Date Noted  . Self-inflicted laceration of wrist 06/17/2018  . Cannabis abuse 06/17/2018  . At risk for intentional self-harm 06/16/2018  . Verbalizes suicidal thoughts 06/16/2018  . 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 Borderline personality disorder (HCC) 09/20/2012  . PTSD (post-traumatic stress disorder) 09/20/2012  . Major depressive disorder, single episode, unspecified 09/20/2012    Past Surgical History:  Procedure Laterality Date  . MOUTH SURGERY      OB History   No obstetric history on file.      Home Medications    Prior to Admission medications   Medication Sig Start Date End Date Taking? Authorizing Provider  eszopiclone (LUNESTA) 2 MG TABS tablet Take 2 mg by mouth at bedtime as needed for sleep. Take immediately before bedtime   Yes  [provider]  FLUoxetine (PROZAC) 40 MG capsule Take 40 mg by mouth daily.   Yes [provider]  lurasidone (LATUDA) 40 MG TABS tablet Take 40 mg by mouth daily with breakfast.   Yes [provider]  meloxicam (MOBIC) 15 MG tablet Take 1 tablet (15 mg total) by mouth daily as needed for pain. 01/22/19   Tommie Samsook, Jeanee Fabre G, DO    Family History Family History  Problem Relation Age of Onset  . Osteopenia Mother   . Heart failure Mother   . Osteoarthritis Mother   . Depression Mother   . Diabetes Father     Social History Social History   Tobacco Use  . Smoking status: Current Every Day Smoker    Packs/day: 1.00    Types: Cigarettes  . Smokeless tobacco: Never Used  Substance Use Topics  . Alcohol use: Not Currently    Comment: "just a couple of sips of vodka"  . Drug use: Yes    Frequency: 3.0 times per week    Types: Marijuana    Comment: last use 01/21/19     Allergies   Valium [diazepam]   Review of Systems Review of Systems  Constitutional: Negative.   Musculoskeletal:       Leg pain.  Psychiatric/Behavioral: The patient is nervous/anxious.    Physical Exam Triage Vital Signs ED Triage Vitals  Enc Vitals Group     BP 01/22/19 1116 132/88     Pulse Rate 01/22/19  1116 75     Resp 01/22/19 1116 18     Temp 01/22/19 1116 98.1 F (36.7 C)     Temp Source 01/22/19 1116 Oral     SpO2 01/22/19 1116 98 %     Weight --      Height 01/22/19 1113 4\' 11"  (1.499 m)     Head Circumference --      Peak Flow --      Pain Score 01/22/19 1113 8     Pain Loc --      Pain Edu? --      Excl. in [place]? --    Updated Vital Signs BP 132/88 (BP Location: Right Arm)   Pulse 75   Temp 98.1 F (36.7 C) (Oral)   Resp 18   Ht 4\' 11"  (1.499 m)   LMP 01/05/2019   SpO2 98%   BMI 33.12 kg/m   Visual Acuity Right Eye Distance:   Left Eye Distance:   Bilateral Distance:    Right Eye Near:   Left Eye Near:    Bilateral Near:     Physical Exam  Vitals signs and nursing note reviewed.  Constitutional:      General: She is not in acute distress.    Appearance: Normal appearance. She is obese.  HENT:     Head: Normocephalic and atraumatic.  Eyes:     General:        Right eye: No discharge.        Left eye: No discharge.     Conjunctiva/sclera: Conjunctivae normal.  Cardiovascular:     Rate and Rhythm: Normal rate and regular rhythm.     Heart sounds: No murmur.  Pulmonary:     Effort: Pulmonary effort is normal.     Breath sounds: Normal breath sounds. No wheezing, rhonchi or rales.  Musculoskeletal:     Comments: Lower extremities -patient with tenderness diffusely from the knee down which is out of proportion to force applied.  Skin:    General: Skin is warm.     Findings: No bruising or rash.  Neurological:     Mental Status: She is alert.  Psychiatric:     Comments: Anxious.  Psychomotor agitation/constant movement.    UC Treatments / Results  Labs (all labs ordered are listed, but only abnormal results are displayed) Labs Reviewed - No data to display  EKG   Radiology No results found.  Procedures Procedures (including critical care time)  Medications Ordered in UC Medications - No data to display  Initial Impression / Assessment and Plan / UC Course  I have reviewed the triage vital signs and the nursing notes.  Pertinent labs & imaging results that were available during my care of the patient were reviewed by me and considered in my medical decision making (see chart for details).    38 year old female presents with akathisia.  Patient also having pain in the lower extremities.  I believe that this is an adverse effect to her [place].  Advised to stop after discussing with her physician who has prescribed this medication.  Meloxicam as needed for pain.  Final Clinical Impressions(s) / UC Diagnoses   Final diagnoses:  Akathisia  Pain in both lower extremities     Discharge Instructions      I believe this is an adverse effect of your medication.  Please call your doctor to discuss.  Mobic as needed for pain.  Take care  Dr. Lacinda Axon    ED Prescriptions  Medication Sig Dispense Auth. Provider   meloxicam (MOBIC) 15 MG tablet Take 1 tablet (15 mg total) by mouth daily as needed for pain. 30 tablet Tommie Samsook, Ren Aspinall G, DO     Controlled Substance Prescriptions [place] Controlled Substance Registry consulted? Not Applicable   Tommie SamsCook, Montrice Gracey G, [place] 01/22/19 1207

## 2019-09-14 ENCOUNTER — Encounter: Payer: Self-pay | Admitting: Emergency Medicine

## 2019-09-14 ENCOUNTER — Emergency Department
Admission: EM | Admit: 2019-09-14 | Discharge: 2019-09-14 | Disposition: A | Discharge status: Home / Self Care | Payer: Medicare Other | Attending: Student in an Organized Health Care Education/Training Program | Admitting: Student in an Organized Health Care Education/Training Program

## 2019-09-14 ENCOUNTER — Other Ambulatory Visit: Payer: Self-pay

## 2019-09-14 DIAGNOSIS — Y939 Activity, unspecified: Secondary | ICD-10-CM | POA: Diagnosis not present

## 2019-09-14 DIAGNOSIS — S39012A Strain of muscle, fascia and tendon of lower back, initial encounter: Secondary | ICD-10-CM | POA: Insufficient documentation

## 2019-09-14 DIAGNOSIS — F17210 Nicotine dependence, cigarettes, uncomplicated: Secondary | ICD-10-CM | POA: Diagnosis not present

## 2019-09-14 DIAGNOSIS — S199XXA Unspecified injury of neck, initial encounter: Secondary | ICD-10-CM | POA: Diagnosis present

## 2019-09-14 DIAGNOSIS — Z79899 Other long term (current) drug therapy: Secondary | ICD-10-CM | POA: Diagnosis not present

## 2019-09-14 DIAGNOSIS — Y92410 Unspecified street and highway as the place of occurrence of the external cause: Secondary | ICD-10-CM | POA: Insufficient documentation

## 2019-09-14 DIAGNOSIS — Y999 Unspecified external cause status: Secondary | ICD-10-CM | POA: Insufficient documentation

## 2019-09-14 DIAGNOSIS — S161XXA Strain of muscle, fascia and tendon at neck level, initial encounter: Secondary | ICD-10-CM | POA: Diagnosis not present

## 2019-09-14 DIAGNOSIS — F1210 Cannabis abuse, uncomplicated: Secondary | ICD-10-CM | POA: Insufficient documentation

## 2019-09-14 MED ORDER — KETOROLAC TROMETHAMINE 30 MG/ML IJ SOLN
30.0000 mg | Freq: Once | INTRAMUSCULAR | Status: AC
  Administered 2019-09-14: 30 mg via INTRAMUSCULAR
  Filled 2019-09-14: qty 1

## 2019-09-14 MED ORDER — ORPHENADRINE CITRATE 30 MG/ML IJ SOLN
60.0000 mg | Freq: Once | INTRAMUSCULAR | Status: AC
  Administered 2019-09-14: 21:00:00 60 mg via INTRAMUSCULAR
  Filled 2019-09-14: qty 2

## 2019-09-14 MED ORDER — MELOXICAM 15 MG PO TABS: 15.0000 mg | ORAL_TABLET | Freq: Every day | ORAL | 2 refills | Status: AC

## 2019-09-14 MED ORDER — CYCLOBENZAPRINE HCL 5 MG PO TABS: 5.0000 mg | ORAL_TABLET | Freq: Three times a day (TID) | ORAL | 0 refills | Status: DC | PRN

## 2019-09-14 NOTE — Discharge Instructions (Addendum)
Please take medications as needed for muscle tightness.  Follow-up with orthopedics if no improvement in 1 week.  Return to the ER for any increasing pain worsening symptoms or urgent changes in your health.
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
## 2019-10-08 ENCOUNTER — Other Ambulatory Visit: Payer: Self-pay

## 2019-10-08 ENCOUNTER — Encounter: Payer: Self-pay | Admitting: Emergency Medicine

## 2019-10-08 ENCOUNTER — Ambulatory Visit
Admission: EM | Admit: 2019-10-08 | Discharge: 2019-10-08 | Disposition: A | Discharge status: Home / Self Care | Payer: Medicare Other | Attending: Family Medicine | Admitting: Family Medicine

## 2019-10-08 DIAGNOSIS — M545 Low back pain, unspecified: Secondary | ICD-10-CM

## 2019-10-08 DIAGNOSIS — M542 Cervicalgia: Secondary | ICD-10-CM

## 2019-10-08 MED ORDER — DICLOFENAC SODIUM 75 MG PO TBEC: 75.0000 mg | DELAYED_RELEASE_TABLET | Freq: Two times a day (BID) | ORAL | 0 refills | Status: AC
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
## 2019-10-08 NOTE — Discharge Instructions (Signed)
Rest.  Heat.  Stop the meloxicam. Try the Diclofenac. Continue the flexeril.  Try the exercises. Daily (10 x each).  Take care  Dr. Adriana Simas
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
## 2019-11-06 ENCOUNTER — Encounter: Payer: Self-pay | Admitting: Emergency Medicine

## 2019-11-06 ENCOUNTER — Ambulatory Visit
Admission: EM | Admit: 2019-11-06 | Discharge: 2019-11-06 | Disposition: A | Discharge status: Home / Self Care | Payer: Medicare Other | Attending: Family Medicine | Admitting: Family Medicine

## 2019-11-06 ENCOUNTER — Other Ambulatory Visit: Payer: Self-pay

## 2019-11-06 DIAGNOSIS — L03319 Cellulitis of trunk, unspecified: Secondary | ICD-10-CM

## 2019-11-06 DIAGNOSIS — W57XXXA Bitten or stung by nonvenomous insect and other nonvenomous arthropods, initial encounter: Secondary | ICD-10-CM

## 2019-11-06 MED ORDER — MUPIROCIN 2 % EX OINT: TOPICAL_OINTMENT | CUTANEOUS | 0 refills | Status: DC

## 2019-11-06 MED ORDER — DOXYCYCLINE HYCLATE 100 MG PO CAPS: 100.0000 mg | ORAL_CAPSULE | Freq: Two times a day (BID) | ORAL | 0 refills | Status: DC

## 2019-11-06 MED ORDER — HYDROXYZINE HCL 25 MG PO TABS: 25.0000 mg | ORAL_TABLET | Freq: Three times a day (TID) | ORAL | 0 refills | Status: DC | PRN

## 2019-11-06 NOTE — Discharge Instructions (Addendum)
Take medication as prescribed. Avoid scratching. Monitor.  Follow up with your primary care physician this week as needed. Return to Urgent care for new or worsening concerns.   

## 2019-11-06 NOTE — ED Provider Notes (Signed)
[facility] ____________________________________________  Time seen: Approximately 3:01 PM  I have reviewed the triage vital signs and the nursing notes.   HISTORY  Chief Complaint Tick Removal   HPI Natalie Rose is a 39 y.o. female presenting for evaluation of right flank tick bite location.  Patient reports 3 days ago she scratched her right back and accidentally scratched off part of a tick.  States that she then had her neighbor tried to remove the remaining tick.  Unsure if they removed all of it as she is now developed a red rash to that area.  States the area has continued to be very itchy.  States the red rash continues to increase in size.  Reports otherwise feels well.  Denies headaches, body aches, fevers, joint pains, vomiting, other rash.  Unsure of how long tick was attached, suspect less than 24 hours.  Denies other aggravating alleviating factors.  Reports otherwise doing well.  Patient's last menstrual period was 10/23/2019 (approximate).  Denies pregnancy. Care, [facility]     Past Medical History:  Diagnosis Date  . Anxiety   . Arthritis   . Depression   . Mental health disorder   . Migraines     Patient Active Problem List   Diagnosis Date Noted  . Self-inflicted laceration of wrist ([place]) 06/17/2018  . Cannabis abuse 06/17/2018  . At risk for intentional self-harm 06/16/2018  . Verbalizes suicidal thoughts 06/16/2018  . MDD (major depressive disorder), recurrent severe, without psychosis ([place]) 06/16/2018  . Borderline personality disorder ([place]) 09/20/2012  . PTSD (post-traumatic stress disorder) 09/20/2012  . Major depressive disorder, single episode, unspecified 09/20/2012    Past Surgical History:  Procedure Laterality Date  . MOUTH SURGERY       No current facility-administered medications for this encounter.  Current Outpatient Medications:  .  eszopiclone (LUNESTA) 2 MG TABS tablet, Take 2 mg by mouth at  bedtime as needed for sleep. Take immediately before bedtime, Disp: , Rfl:  .  REXULTI 2 MG TABS tablet, Take 2 mg by mouth daily., Disp: , Rfl:  .  doxycycline (VIBRAMYCIN) 100 MG capsule, Take 1 capsule (100 mg total) by mouth 2 (two) times daily., Disp: 20 capsule, Rfl: 0 .  hydrOXYzine (ATARAX/VISTARIL) 25 MG tablet, Take 1 tablet (25 mg total) by mouth 3 (three) times daily as needed for itching., Disp: 15 tablet, Rfl: 0 .  LORazepam (ATIVAN) 1 MG tablet, Take 1 mg by mouth daily as needed., Disp: , Rfl:  .  mupirocin ointment (BACTROBAN) 2 %, Apply two times a day for 7 days., Disp: 22 g, Rfl: 0  Allergies Valium [diazepam]  Family History  Problem Relation Age of Onset  . Osteopenia Mother   . Heart failure Mother   . Osteoarthritis Mother   . Depression Mother   . Diabetes Father     Social History Social History   Tobacco Use  . Smoking status: Current Every Day Smoker    Packs/day: 1.00    Types: Cigarettes  . Smokeless tobacco: Never Used  Substance Use Topics  . Alcohol use: Not Currently    Comment: "just a couple of sips of vodka"  . Drug use: Yes    Frequency: 3.0 times per week    Types: Marijuana    Comment: last use 05/05    Review of Systems Constitutional: No fever Cardiovascular: Denies chest pain. Respiratory: Denies shortness of breath. Gastrointestinal: No abdominal pain.  No nausea, no vomiting.   Musculoskeletal:  Negative for back pain. Skin: Positive for rash. Neurological: Negative for headaches, focal weakness or numbness.   ____________________________________________   PHYSICAL EXAM:  VITAL SIGNS: ED Triage Vitals  Enc Vitals Group     BP 11/06/19 1421 101/70     Pulse Rate 11/06/19 1421 87     Resp 11/06/19 1421 14     Temp 11/06/19 1421 98.2 F (36.8 C)     Temp Source 11/06/19 1421 Oral     SpO2 11/06/19 1421 97 %     Weight 11/06/19 1418 165 lb (74.8 kg)     Height 11/06/19 1418 4\' 11"  (1.499 m)     Head Circumference  --      Peak Flow --      Pain Score 11/06/19 1418 0     Pain Loc --      Pain Edu? --      Excl. in GC? --     Constitutional: Alert and oriented. Well appearing and in no acute distress. ENT      Head: Normocephalic and atraumatic. Respiratory: Normal respiratory effort without tachypnea nor retractions.  Musculoskeletal: Steady gait.  Neurologic:  Normal speech and language. Speech is normal. No gait instability.  Skin:  Skin is warm, dry.  Except: Right lateral flank area of approximately 6 x 3 cm of erythema with centered scabbing, no central clearing, nonvesicular, no drainage, no foreign body noted upon inspection, no induration, no drainage, pruritic, no further surrounding erythema. Psychiatric: Mood and affect are normal. Speech and behavior are normal. Patient exhibits appropriate insight and judgment   ___________________________________________   LABS (all labs ordered are listed, but only abnormal results are displayed)  Labs Reviewed - No data to display ____________________________________________   PROCEDURES Procedures    INITIAL IMPRESSION / ASSESSMENT AND PLAN / ED COURSE  Pertinent labs & imaging results that were available during my care of the patient were reviewed by me and considered in my medical decision making (see chart for details).  Well-appearing patient.  No acute distress.  Right flank tick bite with secondary erythema and cellulitis.  No retained foreign body noted on exam.  Treat with oral doxycycline, topical Bactroban and hydroxyzine as needed for itching.  Discussed keeping clean and monitoring.Discussed indication, risks and benefits of medications with patient.   Discussed follow up with Primary care physician this week. Discussed follow up and return parameters including no resolution or any worsening concerns. Patient verbalized understanding and agreed to plan.   ____________________________________________   FINAL CLINICAL  IMPRESSION(S) / ED DIAGNOSES  Final diagnoses:  Cellulitis of trunk, unspecified site of trunk  Tick bite, initial encounter     ED Discharge Orders         Ordered    doxycycline (VIBRAMYCIN) 100 MG capsule  2 times daily     11/06/19 1444    mupirocin ointment (BACTROBAN) 2 %     11/06/19 1444    hydrOXYzine (ATARAX/VISTARIL) 25 MG tablet  3 times daily PRN     11/06/19 1444           Note: This dictation was prepared with Dragon dictation along with smaller phrase technology. Any transcriptional errors that result from this process are unintentional.         01/06/20, NP 11/06/19 1525

## 2019-11-06 NOTE — ED Triage Notes (Signed)
Patient states that she noticed that tick bite 3 days ago on the right side of her back.  Patient states that a friend tried to remove the tick but is not sure if she got all of the tick out.  Patient reports redness and itching at the site.  Patient denies fevers.

## 2020-04-29 ENCOUNTER — Encounter: Payer: Self-pay | Admitting: Emergency Medicine

## 2020-04-29 ENCOUNTER — Ambulatory Visit
Admission: EM | Admit: 2020-04-29 | Discharge: 2020-04-29 | Disposition: A | Discharge status: Home / Self Care | Payer: Medicare Other | Attending: Emergency Medicine | Admitting: Emergency Medicine

## 2020-04-29 ENCOUNTER — Other Ambulatory Visit: Payer: Self-pay

## 2020-04-29 DIAGNOSIS — R1012 Left upper quadrant pain: Secondary | ICD-10-CM | POA: Insufficient documentation

## 2020-04-29 LAB — COMPREHENSIVE METABOLIC PANEL
ALT: 31 U/L (ref 0–44)
AST: 26 U/L (ref 15–41)
Albumin: 3.6 g/dL (ref 3.5–5.0)
Alkaline Phosphatase: 85 U/L (ref 38–126)
Anion gap: 10 (ref 5–15)
BUN: 9 mg/dL (ref 6–20)
CO2: 25 mmol/L (ref 22–32)
Calcium: 8.9 mg/dL (ref 8.9–10.3)
Chloride: 103 mmol/L (ref 98–111)
Creatinine, Ser: 0.64 mg/dL (ref 0.44–1.00)
GFR, Estimated: >60 mL/min (ref >60)
Glucose, Bld: 147 mg/dL — ABNORMAL HIGH (ref 70–99)
Potassium: 4.1 mmol/L (ref 3.5–5.1)
Sodium: 138 mmol/L (ref 135–145)
Total Bilirubin: 0.4 mg/dL (ref 0.3–1.2)
Total Protein: 7.9 g/dL (ref 6.5–8.1)

## 2020-04-29 LAB — CBC WITH DIFFERENTIAL/PLATELET
Abs Immature Granulocytes: 0.25 10*3/uL — ABNORMAL HIGH (ref 0.00–0.07)
Basophils Absolute: 0.1 10*3/uL (ref 0.0–0.1)
Basophils Relative: 0 %
Eosinophils Absolute: 0.2 10*3/uL (ref 0.0–0.5)
Eosinophils Relative: 1 %
HCT: 38.1 % (ref 36.0–46.0)
Hemoglobin: 12.7 g/dL (ref 12.0–15.0)
Immature Granulocytes: 2 %
Lymphocytes Relative: 17 %
Lymphs Abs: 2.8 10*3/uL (ref 0.7–4.0)
MCH: 27.9 pg (ref 26.0–34.0)
MCHC: 33.3 g/dL (ref 30.0–36.0)
MCV: 83.7 fL (ref 80.0–100.0)
Monocytes Absolute: 1.1 10*3/uL — ABNORMAL HIGH (ref 0.1–1.0)
Monocytes Relative: 7 %
Neutro Abs: 12.1 10*3/uL — ABNORMAL HIGH (ref 1.7–7.7)
Neutrophils Relative %: 73 %
Platelets: 406 10*3/uL — ABNORMAL HIGH (ref 150–400)
RBC: 4.55 MIL/uL (ref 3.87–5.11)
RDW: 15.4 % (ref 11.5–15.5)
WBC: 16.6 10*3/uL — ABNORMAL HIGH (ref 4.0–10.5)
nRBC: 0 % (ref 0.0–0.2)

## 2020-04-29 LAB — LIPASE, BLOOD: Lipase: 27 U/L (ref 11–51)

## 2020-04-29 MED ORDER — FAMOTIDINE 20 MG PO TABS: 20.0000 mg | ORAL_TABLET | Freq: Two times a day (BID) | ORAL | 0 refills | Status: DC

## 2020-04-29 MED ORDER — ALUM & MAG HYDROXIDE-SIMETH 200-200-20 MG/5ML PO SUSP
30.0000 mL | Freq: Once | ORAL | Status: AC
  Administered 2020-04-29: 30 mL via ORAL

## 2020-04-29 MED ORDER — LIDOCAINE VISCOUS HCL 2 % MT SOLN
15.0000 mL | Freq: Once | OROMUCOSAL | Status: AC
  Administered 2020-04-29: 15 mL via ORAL

## 2020-04-29 MED ORDER — SUCRALFATE 1 GM/10ML PO SUSP: 1.0000 g | Freq: Four times a day (QID) | ORAL | 0 refills | Status: DC

## 2020-04-29 MED ORDER — PANTOPRAZOLE SODIUM 40 MG PO TBEC: 40.0000 mg | DELAYED_RELEASE_TABLET | Freq: Every day | ORAL | 0 refills | Status: DC

## 2020-04-29 NOTE — ED Triage Notes (Signed)
Patient c/o upper abdominal pain that started 4-5 days ago.  Patient denies V/D.  Patient reports some nausea.  Patient denies urinary symptoms. Patient denies fevers.

## 2020-04-29 NOTE — ED Provider Notes (Signed)
HPI  SUBJECTIVE:  Natalie Rose is a 39 y.o. female who presents with 5 days of constant upper abdominal pain worse on the left.  She states it is achy, becomes sharp with palpation.  It does not migrate or radiate.  She reports nausea, but no fevers, vomiting.  No abdominal distention, chest pain, coughing, wheezing, shortness of breath.  She had a normal bowel movement this morning.  No urinary complaints.  No abdominal trauma.  No antipyretic in the past 6 hours.  She tried Naprosyn 500 mg occasionally.  No alleviating factors.  Symptoms are worse with eating.  It is not associated with fasting, exertion, urination, defecation.  No recent antibiotics.  She states that her prazosin was recently increased, but she was having pain prior to the medication increase.  She has never had symptoms like this before.  She has a past medical history of GERD and is compliant with her Protonix.  Denies excess NSAID use or alcohol intake.  No history of peptic ulcer disease, gastritis, H. pylori, coronary disease, MI, diabetes, hypertension, gallbladder disease, pancreatitis, atrial fibrillation, hypercoagulability, mesenteric ischemia, abdominal surgeries.  She is a smoker.  Family history significant for gallbladder disease in her grandfather.  LMP: Beginning of November.  Denies possibility being pregnant.  LXB:[facility], [facility]     Past Medical History:  Diagnosis Date  . Anxiety   . Arthritis   . Depression   . Mental health disorder   . Migraines     Past Surgical History:  Procedure Laterality Date  . MOUTH SURGERY      Family History  Problem Relation Age of Onset  . Osteopenia Mother   . Heart failure Mother   . Osteoarthritis Mother   . Depression Mother   . Diabetes Father     Social History   Tobacco Use  . Smoking status: Current Every Day Smoker    Packs/day: 1.00    Types: Cigarettes  . Smokeless tobacco: Never Used  Vaping Use  . Vaping Use: Never used   Substance Use Topics  . Alcohol use: Not Currently    Comment: "just a couple of sips of vodka"  . Drug use: Yes    Frequency: 3.0 times per week    Types: Marijuana    Comment: last use 05/05    No current facility-administered medications for this encounter.  Current Outpatient Medications:  .  hydrOXYzine (ATARAX/VISTARIL) 25 MG tablet, Take 1 tablet (25 mg total) by mouth 3 (three) times daily as needed for itching., Disp: 15 tablet, Rfl: 0 .  mirtazapine (REMERON) 15 MG tablet, Take 15 mg by mouth at bedtime., Disp: , Rfl:  .  OLANZapine (ZYPREXA) 10 MG tablet, Take 10 mg by mouth at bedtime., Disp: , Rfl:  .  prazosin (MINIPRESS) 1 MG capsule, Take 1 mg by mouth at bedtime., Disp: , Rfl:  .  prazosin (MINIPRESS) 2 MG capsule, Take 2 mg by mouth at bedtime., Disp: , Rfl:  .  sertraline (ZOLOFT) 100 MG tablet, Take by mouth., Disp: , Rfl:  .  traZODone (DESYREL) 100 MG tablet, Take 100 mg by mouth at bedtime., Disp: , Rfl:  .  famotidine (PEPCID) 20 MG tablet, Take 1 tablet (20 mg total) by mouth 2 (two) times daily., Disp: 60 tablet, Rfl: 0 .  pantoprazole (PROTONIX) 40 MG tablet, Take 1 tablet (40 mg total) by mouth daily., Disp: 30 tablet, Rfl: 0 .  REXULTI 2 MG TABS tablet, Take 2 mg by  mouth daily., Disp: , Rfl:  .  sucralfate (CARAFATE) 1 GM/10ML suspension, Take 10 mLs (1 g total) by mouth 4 (four) times daily. 10 mL before meals and before bedtime, Disp: 240 mL, Rfl: 0  Allergies  Allergen Reactions  . Valium [Diazepam] Hives     ROS  As noted in HPI.   Physical Exam  BP 110/79 (BP Location: Right Arm)   Pulse 100   Temp 97.8 F (36.6 C) (Oral)   Resp 14   Ht 4\' 11"  (1.499 m)   Wt 74.8 kg   LMP 04/08/2020 (Approximate)   SpO2 98%   BMI 33.31 kg/m   Constitutional: Well developed, well nourished, no acute distress Eyes: PERRL, EOMI, conjunctiva normal bilaterally HENT: Normocephalic, atraumatic,mucus membranes moist Respiratory: Clear to auscultation  bilaterally, no rales, no wheezing, no rhonchi Cardiovascular: Normal rate and rhythm, no murmurs, no gallops, no rubs GI: Normal appearance, soft, nondistended, normal bowel sounds, positive upper abdominal tenderness on the left more so than the right.  Positive periumbilical tenderness.  Negative Murphy, negative McBurney, no rebound, no guarding Back: no CVAT skin: No rash, skin intact Musculoskeletal: No deformities Neurologic: Alert & oriented x 3, CN III-XII grossly intact, no motor deficits, sensation grossly intact Psychiatric: Speech and behavior appropriate   ED Course   Medications  alum & mag hydroxide-simeth (MAALOX/MYLANTA) 200-200-20 MG/5ML suspension 30 mL (30 mLs Oral Given 04/29/20 1056)    And  lidocaine (XYLOCAINE) 2 % viscous mouth solution 15 mL (15 mLs Oral Given 04/29/20 1056)    Orders Placed This Encounter  Procedures  . CBC with Differential    Standing Status:   Standing    Number of Occurrences:   1  . Comprehensive metabolic panel    Standing Status:   Standing    Number of Occurrences:   1  . Lipase, blood    Standing Status:   Standing    Number of Occurrences:   1   Results for orders placed or performed during the hospital encounter of 04/29/20 (from the past 24 hour(s))  CBC with Differential     Status: Abnormal   Collection Time: 04/29/20 11:14 AM  Result Value Ref Range   WBC 16.6 (H) 4.0 - 10.5 K/uL   RBC 4.55 3.87 - 5.11 MIL/uL   Hemoglobin 12.7 12.0 - 15.0 g/dL   HCT 05/01/20 36 - 46 %   MCV 83.7 80.0 - 100.0 fL   MCH 27.9 26.0 - 34.0 pg   MCHC 33.3 30.0 - 36.0 g/dL   RDW 16.1 09.6 - 04.5 %   Platelets 406 (H) 150 - 400 K/uL   nRBC 0.0 0.0 - 0.2 %   Neutrophils Relative % 73 %   Neutro Abs 12.1 (H) 1.7 - 7.7 K/uL   Lymphocytes Relative 17 %   Lymphs Abs 2.8 0.7 - 4.0 K/uL   Monocytes Relative 7 %   Monocytes Absolute 1.1 (H) 0.1 - 1.0 K/uL   Eosinophils Relative 1 %   Eosinophils Absolute 0.2 0.0 - 0.5 K/uL   Basophils  Relative 0 %   Basophils Absolute 0.1 0.0 - 0.1 K/uL   Immature Granulocytes 2 %   Abs Immature Granulocytes 0.25 (H) 0.00 - 0.07 K/uL  Comprehensive metabolic panel     Status: Abnormal   Collection Time: 04/29/20 11:14 AM  Result Value Ref Range   Sodium 138 135 - 145 mmol/L   Potassium 4.1 3.5 - 5.1 mmol/L   Chloride 103 98 -  111 mmol/L   CO2 25 22 - 32 mmol/L   Glucose, Bld 147 (H) 70 - 99 mg/dL   BUN 9 6 - 20 mg/dL   Creatinine, Ser 4.09 0.44 - 1.00 mg/dL   Calcium 8.9 8.9 - 81.1 mg/dL   Total Protein 7.9 6.5 - 8.1 g/dL   Albumin 3.6 3.5 - 5.0 g/dL   AST 26 15 - 41 U/L   ALT 31 0 - 44 U/L   Alkaline Phosphatase 85 38 - 126 U/L   Total Bilirubin 0.4 0.3 - 1.2 mg/dL   GFR, Estimated >91 >47 mL/min   Anion gap 10 5 - 15  Lipase, blood     Status: None   Collection Time: 04/29/20 11:14 AM  Result Value Ref Range   Lipase 27 11 - 51 U/L   No results found.  ED Clinical Impression  1. Left upper quadrant abdominal pain      ED Assessment/Plan  Pt abd exam is benign, no peritoneal signs. No evidence of surgical abd. patient has no risk factors for mesenteric ischemia.  Doubt SBO as she had a normal bowel movement this morning without change in her symptoms.  Doubt appendicitis, or perforated viscus.  Checking labs to evaluate for hepatitis, cholecystitis, pancreatitis.  Suspect gastritis/peptic ulcer disease.  Labs reviewed.  Patient has a leukocytosis, and elevated platelets indicative of inflammation, however, her lipase, liver enzymes and bilirubin are all normal.  On reevaluation she states that she feels much better after the GI cocktail.  Plan to send home with Pepcid in addition to the 40 mg of Protonix daily, Carafate.  Refilled the Protonix.  She is to follow-up with her primary care physician in 3 days if not getting any better, gave her very strict ER return precautions.  Discussed labs,MDM, treatment plan, and plan for follow-up with patient Discussed sn/sx  that should prompt return to the ED. patient agrees with plan.    Meds ordered this encounter  Medications  . AND Linked Order Group   . alum & mag hydroxide-simeth (MAALOX/MYLANTA) 200-200-20 MG/5ML suspension 30 mL   . lidocaine (XYLOCAINE) 2 % viscous mouth solution 15 mL  . pantoprazole (PROTONIX) 40 MG tablet    Sig: Take 1 tablet (40 mg total) by mouth daily.    Dispense:  30 tablet    Refill:  0  . famotidine (PEPCID) 20 MG tablet    Sig: Take 1 tablet (20 mg total) by mouth 2 (two) times daily.    Dispense:  60 tablet    Refill:  0  . sucralfate (CARAFATE) 1 GM/10ML suspension    Sig: Take 10 mLs (1 g total) by mouth 4 (four) times daily. 10 mL before meals and before bedtime    Dispense:  240 mL    Refill:  0    *This clinic note was created using Scientist, clinical (histocompatibility and immunogenetics). Therefore, there may be occasional mistakes despite careful proofreading.  ?    Domenick Gong, MD 04/30/20 [phone]

## 2020-04-29 NOTE — Discharge Instructions (Addendum)
I suspect that this is a gastritis.  Could be peptic ulcer disease.  However this would be diagnosed by a specialized test that we do not have available here, or by endoscopy which would be done by a gastroenterologist.  Continue the Protonix, start the Pepcid, bland foods, try the Carafate.  Take at 1000 mg of Tylenol 3-4 times a day as needed for pain.  Do not take any NSAIDs such as Aleve or ibuprofen, Advil for the time being.  Follow-up with your primary care physician if you are not getting better in 3 days, go immediately to the ER for fevers above 100.4, if the pain localizes anywhere, if the pain is not controlled by Tylenol, or for any other concerns

## 2020-07-07 ENCOUNTER — Ambulatory Visit
Admission: EM | Admit: 2020-07-07 | Discharge: 2020-07-07 | Disposition: A | Discharge status: Home / Self Care | Payer: Medicare Other | Attending: Physician Assistant | Admitting: Physician Assistant

## 2020-07-07 ENCOUNTER — Other Ambulatory Visit: Payer: Self-pay

## 2020-07-07 DIAGNOSIS — R0981 Nasal congestion: Secondary | ICD-10-CM | POA: Diagnosis not present

## 2020-07-07 DIAGNOSIS — H66003 Acute suppurative otitis media without spontaneous rupture of ear drum, bilateral: Secondary | ICD-10-CM | POA: Diagnosis not present

## 2020-07-07 MED ORDER — FLUTICASONE PROPIONATE 50 MCG/ACT NA SUSP: 1.0000 | Freq: Every day | NASAL | 2 refills | Status: DC

## 2020-07-07 MED ORDER — CETIRIZINE HCL 10 MG PO TABS: 10.0000 mg | ORAL_TABLET | Freq: Every day | ORAL | 0 refills | Status: DC

## 2020-07-07 MED ORDER — AZITHROMYCIN 250 MG PO TABS: ORAL_TABLET | ORAL | 0 refills | Status: DC

## 2020-07-07 NOTE — ED Triage Notes (Signed)
Pt states bilateral ear pain, worse on left and behind ear. Sharp pulsing pain. Sx started yesterday. Denies recent illness

## 2020-07-07 NOTE — Discharge Instructions (Addendum)
Azithromycin: Take 2 tablets by mouth the first day followed by one tablet daily for next 4 days. -Zyrtec: one tablet at night  -flonase: into both nostrils daily - can also use netti-pot like product or nasal saline flush -ibuprofen and/or Tylenol for pain -follow-up PCP if symptoms worsen or not improve

## 2020-07-07 NOTE — ED Provider Notes (Signed)
[facility]    CSN: [id number] Arrival date & time: 07/07/20  1050      History   Chief Complaint Chief Complaint  Patient presents with  . Otalgia    HPI Natalie Rose is a 40 y.o. female.   Patient is a 40 year old female who presents with chief complaint of bilateral ear pain, worse on the left than the right.  Patient also reports some pain behind her left ear.  Patient states the pain started yesterday.  She denies any decrease in hearing or ringing in ears.  Patient not take any meds at home for this.  Patient does report also, after questioning some nasal pressure this morning.  Patient denies any other drainage, sneezing, stuffiness. Pt reports some occasional seasonal allergy symptoms.      Past Medical History:  Diagnosis Date  . Anxiety   . Arthritis   . Depression   . Mental health disorder   . Migraines     Patient Active Problem List   Diagnosis Date Noted  . Self-inflicted laceration of wrist (HCC) 06/17/2018  . Cannabis abuse 06/17/2018  . At risk for intentional self-harm 06/16/2018  . Verbalizes suicidal thoughts 06/16/2018  . 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 Borderline personality disorder (HCC) 09/20/2012  . PTSD (post-traumatic stress disorder) 09/20/2012  . Major depressive disorder, single episode, unspecified 09/20/2012    Past Surgical History:  Procedure Laterality Date  . MOUTH SURGERY      OB History   No obstetric history on file.      Home Medications    Prior to Admission medications   Medication Sig Start Date End Date Taking? Authorizing Provider  azithromycin (ZITHROMAX Z-PAK) 250 MG tablet Take 2 tablets by mouth the first day followed by one tablet daily for next 4 days. 07/07/20  Yes Candis Schatz, PA-C  cetirizine (ZYRTEC) 10 MG tablet Take 1 tablet (10 mg total) by mouth at bedtime. 07/07/20  Yes Candis Schatz, PA-C  fluticasone  (FLONASE) 50 MCG/ACT nasal spray Place 1 spray into both nostrils daily. 07/07/20  Yes Candis Schatz, PA-C  ferrous sulfate (FEROSUL) 325 (65 FE) MG tablet Take 325 mg by mouth daily with breakfast.  04/29/20  [provider]  famotidine (PEPCID) 20 MG tablet Take 1 tablet (20 mg total) by mouth 2 (two) times daily. 04/29/20   Domenick Gong, MD  hydrOXYzine (ATARAX/VISTARIL) 25 MG tablet Take 1 tablet (25 mg total) by mouth 3 (three) times daily as needed for itching. 11/06/19   Renford Dills, NP  mirtazapine (REMERON) 15 MG tablet Take 15 mg by mouth at bedtime.    [provider]  OLANZapine (ZYPREXA) 10 MG tablet Take 10 mg by mouth at bedtime.    [provider]  pantoprazole (PROTONIX) 40 MG tablet Take 1 tablet (40 mg total) by mouth daily. 04/29/20   Domenick Gong, MD  prazosin (MINIPRESS) 1 MG capsule Take 1 mg by mouth at bedtime.    [provider]  prazosin (MINIPRESS) 2 MG capsule Take 4 mg by mouth at bedtime.    [provider]  REXULTI 2 MG TABS tablet Take 2 mg by mouth daily. 09/23/19   [provider]  sertraline (ZOLOFT) 100 MG tablet Take by mouth. 03/16/20   [provider]  sucralfate (CARAFATE) 1 GM/10ML suspension Take 10 mLs (1 g total) by mouth 4 (four) times daily. 10 mL before meals and before bedtime  04/29/20   Domenick Gong, MD  traZODone (DESYREL) 100 MG tablet Take 100 mg by mouth at bedtime.    [provider]  eszopiclone (LUNESTA) 2 MG TABS tablet Take 2 mg by mouth at bedtime as needed for sleep. Take immediately before bedtime  04/29/20  [provider]  FLUoxetine (PROZAC) 40 MG capsule Take 40 mg by mouth daily.  10/08/19  [provider]  lurasidone (LATUDA) 40 MG TABS tablet Take 40 mg by mouth daily with breakfast.  10/08/19  [provider]    Family History Family History  Problem Relation Age of Onset  . Osteopenia Mother   . Heart failure  Mother   . Osteoarthritis Mother   . Depression Mother   . Diabetes Father     Social History Social History   Tobacco Use  . Smoking status: Current Every Day Smoker    Packs/day: 1.00    Types: Cigarettes  . Smokeless tobacco: Never Used  Vaping Use  . Vaping Use: Never used  Substance Use Topics  . Alcohol use: Not Currently  . Drug use: Yes    Frequency: 3.0 times per week    Types: Marijuana    Comment: last use December 2021     Allergies   Valium [diazepam]   Review of Systems Review of Systems As noted above in HPI, other systems reviewed and found to be negative.   Physical Exam Triage Vital Signs ED Triage Vitals  Enc Vitals Group     BP 07/07/20 1131 104/77     Pulse Rate 07/07/20 1131 87     Resp 07/07/20 1131 16     Temp 07/07/20 1131 97.8 F (36.6 C)     Temp Source 07/07/20 1131 Oral     SpO2 07/07/20 1131 99 %     Weight 07/07/20 1125 185 lb (83.9 kg)     Height 07/07/20 1125 4\' 11"  (1.499 m)     Head Circumference --      Peak Flow --      Pain Score 07/07/20 1125 4     Pain Loc --      Pain Edu? --      Excl. in GC? --    No data found.  Updated Vital Signs BP 104/77 (BP Location: Right Arm)   Pulse 87   Temp 97.8 F (36.6 C) (Oral)   Resp 16   Ht 4\' 11"  (1.499 m)   Wt 185 lb (83.9 kg)   LMP 06/28/2020   SpO2 99%   BMI 37.37 kg/m   Physical Exam Constitutional:      Appearance: Normal appearance. She is not ill-appearing.  HENT:     Right Ear: Hearing and ear canal normal. Tenderness present. A middle ear effusion (cloudy) is present. There is no impacted cerumen.     Left Ear: Hearing and ear canal normal. Tenderness present. A middle ear effusion (cloudy) is present. There is no impacted cerumen.     Ears:     Comments: Some post auricular pain, L>R Cardiovascular:     Rate and Rhythm: Normal rate and regular rhythm.  Pulmonary:     Effort: Pulmonary effort is normal.  Neurological:     General: No focal deficit  present.     Mental Status: She is alert and oriented to person, place, and time.      UC Treatments / Results  Labs (all labs ordered are listed, but only abnormal results are displayed) Labs Reviewed -  No data to display  EKG   Radiology No results found.  Procedures Procedures (including critical care time)  Medications Ordered in UC Medications - No data to display  Initial Impression / Assessment and Plan / UC Course  I have reviewed the triage vital signs and the nursing notes.  Pertinent labs & imaging results that were available during my care of the patient were reviewed by me and considered in my medical decision making (see chart for details).     Cloudy middle ear effusions bilaterally. Post auricular tenderness. Reports sinus pressure this morning.  Zyrtec and Flonase to open nasal passages to allow drainage. Z-pack for ear infection.  Final Clinical Impressions(s) / UC Diagnoses   Final diagnoses:  Non-recurrent acute suppurative otitis media of both ears without spontaneous rupture of tympanic membranes  Congestion of nasal sinus     Discharge Instructions     Azithromycin: Take 2 tablets by mouth the first day followed by one tablet daily for next 4 days. -Zyrtec: one tablet at night  -flonase: into both nostrils daily - can also use netti-pot like product or nasal saline flush -ibuprofen and/or Tylenol for pain -follow-up PCP if symptoms worsen or not improve    ED Prescriptions    Medication Sig Dispense Auth. Provider   azithromycin (ZITHROMAX Z-PAK) 250 MG tablet Take 2 tablets by mouth the first day followed by one tablet daily for next 4 days. 6 tablet Candis Schatz, PA-C   cetirizine (ZYRTEC) 10 MG tablet Take 1 tablet (10 mg total) by mouth at bedtime. 30 tablet Candis Schatz, PA-C   fluticasone [facility]) 50 MCG/ACT nasal spray Place 1 spray into both nostrils daily. 16 g Candis Schatz, PA-C     PDMP not reviewed this  encounter.   Candis Schatz, PA-C 07/07/20 1155

## 2020-07-14 ENCOUNTER — Ambulatory Visit
Admission: EM | Admit: 2020-07-14 | Discharge: 2020-07-14 | Disposition: A | Discharge status: Home / Self Care | Payer: Medicare Other | Attending: Sports Medicine | Admitting: Sports Medicine

## 2020-07-14 ENCOUNTER — Other Ambulatory Visit: Payer: Self-pay

## 2020-07-14 ENCOUNTER — Encounter: Payer: Self-pay | Admitting: Emergency Medicine

## 2020-07-14 DIAGNOSIS — H9203 Otalgia, bilateral: Secondary | ICD-10-CM | POA: Diagnosis not present

## 2020-07-14 DIAGNOSIS — R0982 Postnasal drip: Secondary | ICD-10-CM | POA: Diagnosis not present

## 2020-07-14 DIAGNOSIS — F17210 Nicotine dependence, cigarettes, uncomplicated: Secondary | ICD-10-CM | POA: Diagnosis not present

## 2020-07-14 DIAGNOSIS — Z20822 Contact with and (suspected) exposure to covid-19: Secondary | ICD-10-CM | POA: Diagnosis not present

## 2020-07-14 DIAGNOSIS — R0683 Snoring: Secondary | ICD-10-CM | POA: Diagnosis not present

## 2020-07-14 DIAGNOSIS — Z79899 Other long term (current) drug therapy: Secondary | ICD-10-CM | POA: Insufficient documentation

## 2020-07-14 DIAGNOSIS — Z888 Allergy status to other drugs, medicaments and biological substances status: Secondary | ICD-10-CM | POA: Insufficient documentation

## 2020-07-14 DIAGNOSIS — J029 Acute pharyngitis, unspecified: Secondary | ICD-10-CM | POA: Diagnosis not present

## 2020-07-14 LAB — GROUP A STREP BY PCR: Group A Strep by PCR: NOT DETECTED

## 2020-07-14 LAB — SARS CORONAVIRUS 2 (TAT 6-24 HRS): SARS Coronavirus 2: NEGATIVE

## 2020-07-14 NOTE — Discharge Instructions (Addendum)
Please see attached instructions. Your Covid and strep test are pending.  Someone will contact you with the results. You need to isolate and wait on your Covid results.  If they are negative he can come out of isolation.  If you are positive and that we will switch to quarantine per CDC guidelines. Supportive care, plenty of rest, plenty of fluids, Tylenol or Motrin for fever or discomfort.  Throat lozenges for your sore throat.  Mucinex for any congestion.  You can also use your Flonase for the congestion as well as the Zyrtec that was recently prescribed.  I hope you get to feeling better,  Dr. Zachery Dauer

## 2020-07-14 NOTE — ED Provider Notes (Signed)
[facility]    CSN: [id number] Arrival date & time: 07/14/20  1055      History   Chief Complaint Chief Complaint  Patient presents with  . Sore Throat  . Cough    HPI Natalie Rose is a 40 y.o. female.   Patient pleasant 40 year old female who presents for evaluation of an acute sore throat.  She reports that she woke up this morning with a sore throat and some drainage from the back of her throat.  She has a chronic cough but she is a smoker.  She is also not little bit of watery eyes.  No chest pain shortness of breath or ear pain.  No chest congestion or myalgias.  No other Covid symptoms.  No fever shakes chills, no nausea vomiting or diarrhea.  No abdominal pain or urinary symptoms.  She does report that she does snore at night.  Does not use a CPAP machine.  Of note, she was seen here about a week ago diagnosed with otitis media and treated with a Z-Pak.  Her ears are much better.  No other symptoms or problems are offered.  No red flag signs or symptoms elicited on history..     Past Medical History:  Diagnosis Date  . Anxiety   . Arthritis   . Depression   . Mental health disorder   . Migraines     Patient Active Problem List   Diagnosis Date Noted  . Self-inflicted laceration of wrist (HCC) 06/17/2018  . Cannabis abuse 06/17/2018  . At risk for intentional self-harm 06/16/2018  . Verbalizes suicidal thoughts 06/16/2018  . 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 Borderline personality disorder (HCC) 09/20/2012  . PTSD (post-traumatic stress disorder) 09/20/2012  . Major depressive disorder, single episode, unspecified 09/20/2012    Past Surgical History:  Procedure Laterality Date  . MOUTH SURGERY      OB History   No obstetric history on file.      Home Medications    Prior to Admission medications   Medication Sig Start Date End Date Taking? Authorizing Provider   cetirizine (ZYRTEC) 10 MG tablet Take 1 tablet (10 mg total) by mouth at bedtime. 07/07/20  Yes Candis Schatz, PA-C  famotidine (PEPCID) 20 MG tablet Take 1 tablet (20 mg total) by mouth 2 (two) times daily. 04/29/20  Yes Domenick Gong, MD  fluticasone (FLONASE) 50 MCG/ACT nasal spray Place 1 spray into both nostrils daily. 07/07/20  Yes Candis Schatz, PA-C  hydrOXYzine (ATARAX/VISTARIL) 25 MG tablet Take 1 tablet (25 mg total) by mouth 3 (three) times daily as needed for itching. 11/06/19  Yes Renford Dills, NP  mirtazapine (REMERON) 15 MG tablet Take 15 mg by mouth at bedtime.   Yes [provider]  OLANZapine (ZYPREXA) 10 MG tablet Take 10 mg by mouth at bedtime.   Yes [provider]  pantoprazole (PROTONIX) 40 MG tablet Take 1 tablet (40 mg total) by mouth daily. 04/29/20  Yes Domenick Gong, MD  prazosin (MINIPRESS) 2 MG capsule Take 4 mg by mouth at bedtime.   Yes [provider]  sertraline (ZOLOFT) 100 MG tablet Take by mouth. 03/16/20  Yes [provider]  sucralfate (CARAFATE) 1 GM/10ML suspension Take 10 mLs (1 g total) by mouth 4 (four) times daily. 10 mL before meals and before bedtime 04/29/20  Yes Domenick Gong, MD  traZODone (DESYREL) 100 MG tablet Take 100 mg by mouth at bedtime.  Yes [provider]  ferrous sulfate (FEROSUL) 325 (65 FE) MG tablet Take 325 mg by mouth daily with breakfast.  04/29/20  [provider]  azithromycin (ZITHROMAX Z-PAK) 250 MG tablet Take 2 tablets by mouth the first day followed by one tablet daily for next 4 days. 07/07/20   Candis Schatz, PA-C  prazosin (MINIPRESS) 1 MG capsule Take 1 mg by mouth at bedtime.    [provider]  REXULTI 2 MG TABS tablet Take 2 mg by mouth daily. 09/23/19   [provider]  eszopiclone (LUNESTA) 2 MG TABS tablet Take 2 mg by mouth at bedtime as needed for sleep. Take immediately before bedtime  04/29/20  [provider]   FLUoxetine (PROZAC) 40 MG capsule Take 40 mg by mouth daily.  10/08/19  [provider]  lurasidone (LATUDA) 40 MG TABS tablet Take 40 mg by mouth daily with breakfast.  10/08/19  [provider]    Family History Family History  Problem Relation Age of Onset  . Osteopenia Mother   . Heart failure Mother   . Osteoarthritis Mother   . Depression Mother   . Diabetes Father     Social History Social History   Tobacco Use  . Smoking status: Current Every Day Smoker    Packs/day: 1.00    Types: Cigarettes  . Smokeless tobacco: Never Used  Vaping Use  . Vaping Use: Never used  Substance Use Topics  . Alcohol use: Not Currently  . Drug use: Yes    Frequency: 3.0 times per week    Types: Marijuana    Comment: last use December 2021     Allergies   Valium [diazepam]   Review of Systems Review of systems  Positive for sore throat and postnasal drip with watery eyes. Negative for chest pain, shortness of breath, ear pain, chest congestion, myalgias or any Covid-like symptoms.  Remainder of 12 point review of systems was negative.  Physical Exam Triage Vital Signs ED Triage Vitals  Enc Vitals Group     BP 07/14/20 1105 127/84     Pulse Rate 07/14/20 1105 79     Resp 07/14/20 1105 18     Temp 07/14/20 1105 97.8 F (36.6 C)     Temp Source 07/14/20 1105 Oral     SpO2 07/14/20 1105 100 %     Weight 07/14/20 1103 184 lb 15.5 oz (83.9 kg)     Height 07/14/20 1103 4\' 11"  (1.499 m)     Head Circumference --      Peak Flow --      Pain Score 07/14/20 1103 6     Pain Loc --      Pain Edu? --      Excl. in GC? --    No data found.  Updated Vital Signs BP 127/84 (BP Location: Right Arm)   Pulse 79   Temp 97.8 F (36.6 C) (Oral)   Resp 18   Ht 4\' 11"  (1.499 m)   Wt 83.9 kg   LMP 06/28/2020   SpO2 100%   BMI 37.36 kg/m   Visual Acuity Right Eye Distance:   Left Eye Distance:   Bilateral Distance:    Right Eye Near:   Left Eye Near:     Bilateral Near:     Physical Exam Vitals and nursing note reviewed.  Constitutional:      General: She is not in acute distress.    Appearance: She is well-developed. She is  not ill-appearing or toxic-appearing.  HENT:     Head: Normocephalic and atraumatic.     Right Ear: Tympanic membrane normal.     Left Ear: Tympanic membrane normal.     Nose: No congestion or rhinorrhea.     Mouth/Throat:     Mouth: Mucous membranes are moist. No oral lesions.     Pharynx: Uvula midline. Posterior oropharyngeal erythema present. No pharyngeal swelling, oropharyngeal exudate or uvula swelling.     Tonsils: No tonsillar exudate or tonsillar abscesses. 1+ on the right.  Eyes:     Conjunctiva/sclera: Conjunctivae normal.     Pupils: Pupils are equal, round, and reactive to light.  Cardiovascular:     Rate and Rhythm: Normal rate and regular rhythm.     Heart sounds: Normal heart sounds. No murmur heard. No gallop.   Pulmonary:     Effort: Pulmonary effort is normal. No respiratory distress.     Breath sounds: No stridor. No wheezing or rales.  Musculoskeletal:     Cervical back: Normal range of motion and neck supple.  Lymphadenopathy:     Cervical: Cervical adenopathy present.  Skin:    Capillary Refill: Capillary refill takes less than 2 seconds.  Neurological:     General: No focal deficit present.     Mental Status: She is alert and oriented to person, place, and time.  Psychiatric:        Mood and Affect: Mood normal.        Behavior: Behavior normal.      UC Treatments / Results  Labs (all labs ordered are listed, but only abnormal results are displayed) Labs Reviewed  GROUP A STREP BY PCR  SARS CORONAVIRUS 2 (TAT 6-24 HRS)    EKG   Radiology No results found.  Procedures Procedures (including critical care time)  Medications Ordered in UC Medications - No data to display  Initial Impression / Assessment and Plan / UC Course  I have reviewed the triage vital  signs and the nursing notes.  Pertinent labs & imaging results that were available during my care of the patient were reviewed by me and considered in my medical decision making (see chart for details).  Clinical impression: Sore throat since this morning with drainage.  Patient's been recently treated for an otitis media and finished a 5-day course of azithromycin.  Her vital signs and exam are reassuring.  She has not been vaccinated against COVID.  Treatment plan: 1.  The findings and treatment plan were discussed in detail with the patient.  Patient was in agreement. 2.  Her Covid test was pending at the time of discharge. 3.  Her strep test was negative. 4.  Supportive care, over-the-counter meds as needed, Tylenol or Motrin for fever discomfort.  Plenty of rest and plenty fluids. 5.  Educational handouts were provided. 6.  She is on disability so no work note was needed. 7.  Follow-up here as needed or with her primary care physician.    Final Clinical Impressions(s) / UC Diagnoses   Final diagnoses:  Sore throat  Post-nasal drainage  Ear pain, bilateral     Discharge Instructions     Please see attached instructions. Your Covid and strep test are pending.  Someone will contact you with the results. You need to isolate and wait on your Covid results.  If they are negative he can come out of isolation.  If you are positive and that we will switch to quarantine per CDC guidelines. Supportive  care, plenty of rest, plenty of fluids, Tylenol or Motrin for fever or discomfort.  Throat lozenges for your sore throat.  Mucinex for any congestion.  You can also use your Flonase for the congestion as well as the Zyrtec that was recently prescribed.  I hope you get to feeling better,  Dr. Zachery Dauer    ED Prescriptions    None     PDMP not reviewed this encounter.   Delton See, MD 07/14/20 [phone]

## 2020-07-14 NOTE — ED Triage Notes (Signed)
Pt c/o sore throat, cough, runny nose. Started this morning. Denies body aches or fever. She has not had covid vaccines.

## 2020-12-15 ENCOUNTER — Emergency Department: Payer: Medicare Other

## 2020-12-15 ENCOUNTER — Observation Stay
Admission: EM | Admit: 2020-12-15 | Discharge: 2020-12-17 | Disposition: A | Discharge status: Home / Self Care | Payer: Medicare Other | Attending: Internal Medicine | Admitting: Internal Medicine

## 2020-12-15 ENCOUNTER — Other Ambulatory Visit: Payer: Self-pay

## 2020-12-15 DIAGNOSIS — F4323 Adjustment disorder with mixed anxiety and depressed mood: Secondary | ICD-10-CM | POA: Diagnosis not present

## 2020-12-15 DIAGNOSIS — H4710 Unspecified papilledema: Secondary | ICD-10-CM

## 2020-12-15 DIAGNOSIS — H532 Diplopia: Principal | ICD-10-CM | POA: Insufficient documentation

## 2020-12-15 DIAGNOSIS — F17210 Nicotine dependence, cigarettes, uncomplicated: Secondary | ICD-10-CM | POA: Diagnosis not present

## 2020-12-15 DIAGNOSIS — F32A Depression, unspecified: Secondary | ICD-10-CM | POA: Insufficient documentation

## 2020-12-15 DIAGNOSIS — Z79899 Other long term (current) drug therapy: Secondary | ICD-10-CM | POA: Insufficient documentation

## 2020-12-15 DIAGNOSIS — G932 Benign intracranial hypertension: Secondary | ICD-10-CM

## 2020-12-15 DIAGNOSIS — J449 Chronic obstructive pulmonary disease, unspecified: Secondary | ICD-10-CM | POA: Diagnosis not present

## 2020-12-15 DIAGNOSIS — F419 Anxiety disorder, unspecified: Secondary | ICD-10-CM | POA: Insufficient documentation

## 2020-12-15 DIAGNOSIS — G9009 Other idiopathic peripheral autonomic neuropathy: Secondary | ICD-10-CM | POA: Diagnosis not present

## 2020-12-15 DIAGNOSIS — M1990 Unspecified osteoarthritis, unspecified site: Secondary | ICD-10-CM | POA: Insufficient documentation

## 2020-12-15 DIAGNOSIS — K219 Gastro-esophageal reflux disease without esophagitis: Secondary | ICD-10-CM | POA: Insufficient documentation

## 2020-12-15 DIAGNOSIS — U071 COVID-19: Secondary | ICD-10-CM | POA: Diagnosis not present

## 2020-12-15 LAB — CBC WITH DIFFERENTIAL/PLATELET
Abs Immature Granulocytes: 0.1 10*3/uL — ABNORMAL HIGH (ref 0.00–0.07)
Basophils Absolute: 0 10*3/uL (ref 0.0–0.1)
Basophils Relative: 0 %
Eosinophils Absolute: 0.3 10*3/uL (ref 0.0–0.5)
Eosinophils Relative: 3 %
HCT: 40.9 % (ref 36.0–46.0)
Hemoglobin: 13.7 g/dL (ref 12.0–15.0)
Immature Granulocytes: 1 %
Lymphocytes Relative: 33 %
Lymphs Abs: 3.3 10*3/uL (ref 0.7–4.0)
MCH: 28.9 pg (ref 26.0–34.0)
MCHC: 33.5 g/dL (ref 30.0–36.0)
MCV: 86.3 fL (ref 80.0–100.0)
Monocytes Absolute: 0.8 10*3/uL (ref 0.1–1.0)
Monocytes Relative: 8 %
Neutro Abs: 5.5 10*3/uL (ref 1.7–7.7)
Neutrophils Relative %: 55 %
Platelets: 266 10*3/uL (ref 150–400)
RBC: 4.74 MIL/uL (ref 3.87–5.11)
RDW: 13.6 % (ref 11.5–15.5)
WBC: 9.9 10*3/uL (ref 4.0–10.5)
nRBC: 0 % (ref 0.0–0.2)

## 2020-12-15 LAB — BASIC METABOLIC PANEL
Anion gap: 9 (ref 5–15)
BUN: 9 mg/dL (ref 6–20)
CO2: 25 mmol/L (ref 22–32)
Calcium: 9.7 mg/dL (ref 8.9–10.3)
Chloride: 105 mmol/L (ref 98–111)
Creatinine, Ser: 0.74 mg/dL (ref 0.44–1.00)
GFR, Estimated: >60 mL/min (ref >60)
Glucose, Bld: 153 mg/dL — ABNORMAL HIGH (ref 70–99)
Potassium: 3.9 mmol/L (ref 3.5–5.1)
Sodium: 139 mmol/L (ref 135–145)

## 2020-12-15 LAB — SEDIMENTATION RATE: Sed Rate: 16 mm/hr (ref 0–20)

## 2020-12-15 LAB — RESP PANEL BY RT-PCR (FLU A&B, COVID) ARPGX2
Influenza A by PCR: NEGATIVE
Influenza B by PCR: NEGATIVE
SARS Coronavirus 2 by RT PCR: POSITIVE — AB

## 2020-12-15 LAB — POC URINE PREG, ED: Preg Test, Ur: NEGATIVE

## 2020-12-15 MED ORDER — IPRATROPIUM-ALBUTEROL 20-100 MCG/ACT IN AERS
1.0000 | INHALATION_SPRAY | Freq: Four times a day (QID) | RESPIRATORY_TRACT | Status: DC
  Administered 2020-12-15 – 2020-12-17 (×6): 1 via RESPIRATORY_TRACT
  Filled 2020-12-15: qty 4

## 2020-12-15 MED ORDER — SODIUM CHLORIDE 0.9 % IV SOLN: INTRAVENOUS | Status: DC

## 2020-12-15 MED ORDER — PRAZOSIN HCL 2 MG PO CAPS
4.0000 mg | ORAL_CAPSULE | Freq: Every day | ORAL | Status: DC
  Administered 2020-12-16: 4 mg via ORAL
  Filled 2020-12-15 (×3): qty 2

## 2020-12-15 MED ORDER — GADOBUTROL 1 MMOL/ML IV SOLN
10.0000 mL | Freq: Once | INTRAVENOUS | Status: AC | PRN
  Administered 2020-12-15: 9 mL via INTRAVENOUS

## 2020-12-15 MED ORDER — ACETAMINOPHEN 325 MG PO TABS: 650.0000 mg | ORAL_TABLET | Freq: Four times a day (QID) | ORAL | Status: DC | PRN

## 2020-12-15 MED ORDER — OLANZAPINE 10 MG PO TABS: 10.0000 mg | ORAL_TABLET | Freq: Every day | ORAL | Status: DC

## 2020-12-15 MED ORDER — MIRTAZAPINE 15 MG PO TABS
15.0000 mg | ORAL_TABLET | Freq: Every day | ORAL | Status: DC
  Administered 2020-12-16: 15 mg via ORAL
  Filled 2020-12-15: qty 1

## 2020-12-15 MED ORDER — OLANZAPINE-SAMIDORPHAN 10-10 MG PO TABS: 1.0000 | ORAL_TABLET | Freq: Every day | ORAL | Status: DC | PRN

## 2020-12-15 MED ORDER — PANTOPRAZOLE SODIUM 40 MG PO TBEC
40.0000 mg | DELAYED_RELEASE_TABLET | Freq: Every day | ORAL | Status: DC
  Administered 2020-12-16 – 2020-12-17 (×2): 40 mg via ORAL
  Filled 2020-12-15 (×2): qty 1

## 2020-12-15 MED ORDER — FLUTICASONE PROPIONATE 50 MCG/ACT NA SUSP
1.0000 | Freq: Every day | NASAL | Status: DC
  Administered 2020-12-16: 1 via NASAL
  Filled 2020-12-15: qty 16

## 2020-12-15 MED ORDER — FAMOTIDINE 20 MG PO TABS
20.0000 mg | ORAL_TABLET | Freq: Two times a day (BID) | ORAL | Status: DC
  Administered 2020-12-15 – 2020-12-16 (×2): 20 mg via ORAL
  Filled 2020-12-15 (×2): qty 1

## 2020-12-15 MED ORDER — STROKE: EARLY STAGES OF RECOVERY BOOK: Freq: Once | Status: DC

## 2020-12-15 MED ORDER — SERTRALINE HCL 100 MG PO TABS
150.0000 mg | ORAL_TABLET | Freq: Every day | ORAL | Status: DC | PRN
  Filled 2020-12-15: qty 1.5

## 2020-12-15 MED ORDER — ONDANSETRON HCL 4 MG/2ML IJ SOLN
4.0000 mg | Freq: Four times a day (QID) | INTRAMUSCULAR | Status: DC | PRN
  Administered 2020-12-16 – 2020-12-17 (×2): 4 mg via INTRAVENOUS
  Filled 2020-12-15 (×2): qty 2

## 2020-12-15 MED ORDER — TRAZODONE HCL 50 MG PO TABS: 25.0000 mg | ORAL_TABLET | Freq: Every evening | ORAL | Status: DC | PRN

## 2020-12-15 MED ORDER — ZINC SULFATE 220 (50 ZN) MG PO CAPS
220.0000 mg | ORAL_CAPSULE | Freq: Every day | ORAL | Status: DC
  Administered 2020-12-16 – 2020-12-17 (×2): 220 mg via ORAL
  Filled 2020-12-15 (×2): qty 1

## 2020-12-15 MED ORDER — ONDANSETRON HCL 4 MG PO TABS
4.0000 mg | ORAL_TABLET | Freq: Four times a day (QID) | ORAL | Status: DC | PRN
  Administered 2020-12-16: 4 mg via ORAL
  Filled 2020-12-15: qty 1

## 2020-12-15 MED ORDER — MAGNESIUM HYDROXIDE 400 MG/5ML PO SUSP: 30.0000 mL | Freq: Every day | ORAL | Status: DC | PRN

## 2020-12-15 MED ORDER — LORAZEPAM 2 MG/ML IJ SOLN
1.0000 mg | INTRAMUSCULAR | Status: DC | PRN
  Administered 2020-12-15: 1 mg via INTRAVENOUS
  Filled 2020-12-15: qty 1

## 2020-12-15 MED ORDER — ENOXAPARIN SODIUM 60 MG/0.6ML IJ SOSY
0.5000 mg/kg | PREFILLED_SYRINGE | INTRAMUSCULAR | Status: DC
  Administered 2020-12-15: 45 mg via SUBCUTANEOUS
  Filled 2020-12-15: qty 0.6

## 2020-12-15 MED ORDER — ASCORBIC ACID 500 MG PO TABS
500.0000 mg | ORAL_TABLET | Freq: Every day | ORAL | Status: DC
  Administered 2020-12-16 – 2020-12-17 (×2): 500 mg via ORAL
  Filled 2020-12-15 (×2): qty 1

## 2020-12-15 MED ORDER — TRAZODONE HCL 100 MG PO TABS
100.0000 mg | ORAL_TABLET | Freq: Every day | ORAL | Status: DC
  Administered 2020-12-16: 100 mg via ORAL
  Filled 2020-12-15: qty 1

## 2020-12-15 MED ORDER — HYDROXYZINE HCL 25 MG PO TABS
50.0000 mg | ORAL_TABLET | Freq: Two times a day (BID) | ORAL | Status: DC
  Administered 2020-12-16: 50 mg via ORAL
  Filled 2020-12-15: qty 2

## 2020-12-15 NOTE — Progress Notes (Signed)
PHARMACIST - PHYSICIAN COMMUNICATION  CONCERNING:  Enoxaparin (Lovenox) for DVT Prophylaxis    RECOMMENDATION: Patient was prescribed enoxaprin 40mg  q24 hours for VTE prophylaxis.   Filed Weights   12/15/20 1457  Weight: 90.7 kg (200 lb)    Body mass index is 40.4 kg/m.  Estimated Creatinine Clearance: 94.5 mL/min (by C-G formula based on SCr of 0.74 mg/dL).   Based on [facility] policy patient is candidate for enoxaparin 0.5mg /kg TBW SQ every 24 hours based on BMI being >30.  DESCRIPTION: Pharmacy has adjusted enoxaparin dose per [employer] policy.  Patient is now receiving enoxaparin 0.5 mg/kg every 24 hours   [facility], PharmD, [facility] 12/15/2020 10:49 PM

## 2020-12-15 NOTE — H&P (Addendum)
[name]   PATIENT NAME: Natalie Rose    MR#:  [id number]  DATE OF BIRTH:  01/26/1981  DATE OF ADMISSION:  12/15/2020  PRIMARY CARE PHYSICIAN: [facility]   Patient is coming from: Home  REQUESTING/REFERRING PHYSICIAN: Chesley NoonJessup, Charles, MD  CHIEF COMPLAINT:   Chief Complaint  Patient presents with   Diplopia    HISTORY OF PRESENT ILLNESS:  Natalie Rose is a 40 y.o. Caucasian female with medical history significant for osteoarthritis, anxiety and depression and migraine, who presented to the emergency room with acute onset of diplopia for the last couple of weeks.  3 weeks ago she was diagnosed with COVID.  She has no respiratory or any other residual symptoms including cough dyspnea, nausea or vomiting or or diarrhea.  She denies any paresthesias or focal muscle weakness.  No tinnitus or vertigo.  She had a fundus exam today that revealed papilledema for which she was referred to the ER.  She denies any bleeding diathesis.  No dysuria, oliguria or hematuria or flank pain.  No chest pain or palpitations.  ED Course: Upon presenting to the emergency room blood pressure was 143/98 with otherwise normal vital signs.  Labs revealed unremarkable BMP and CBC.  Urine pregnancy test was negative.  COVID-19 PCR came back positive.  He was antigens were negative.  Imaging: Brain MRI of the head without contrast and MRV of the head with and without contrast revealed the following: 1. Partially empty sella with focal stenoses at the junctions of the transverse and sigmoid sinuses bilaterally. Findings are nonspecific, but can be seen in the setting of idiopathic intracranial hypertension. Correlation with LP and opening pressure suggested. 2. Subtle bulging of the optic discs at the posterior globes bilaterally, consistent with history of papilledema. 3. Otherwise normal MRI of the brain and normal intracranial MRV. No other acute intracranial abnormality. No  evidence for dural sinus Thrombosis.  Neurology consultation was obtained and recommendation was for the brain MRI and MRV and for IR guided lumbar puncture.  The patient will be admitted to an observation medical monitored bed for further evaluation and management. PAST MEDICAL HISTORY:   Past Medical History:  Diagnosis Date   Anxiety    Arthritis    Depression    Mental health disorder    Migraines     PAST SURGICAL HISTORY:   Past Surgical History:  Procedure Laterality Date   MOUTH SURGERY      SOCIAL HISTORY:   Social History   Tobacco Use   Smoking status: Every Day    Packs/day: 1.00    Types: Cigarettes   Smokeless tobacco: Never  Substance Use Topics   Alcohol use: Not Currently    FAMILY HISTORY:   Family History  Problem Relation Age of Onset   Osteopenia Mother    Heart failure Mother    Osteoarthritis Mother    Depression Mother    Diabetes Father     DRUG ALLERGIES:   Allergies  Allergen Reactions   Lurasidone    Valium [Diazepam] Hives    REVIEW OF SYSTEMS:   ROS As per history of present illness. All pertinent systems were reviewed above. Constitutional, HEENT, cardiovascular, respiratory, GI, GU, musculoskeletal, neuro, psychiatric, endocrine, integumentary and hematologic systems were reviewed and are otherwise negative/unremarkable except for positive findings mentioned above in the HPI.   MEDICATIONS AT HOME:   Prior to Admission medications   Medication Sig Start Date End Date Taking? Authorizing Provider  COMBIVENT RESPIMAT 20-100 MCG/ACT AERS respimat 1 puff 4 (four) times daily. 11/29/20  Yes [provider]  doxycycline (VIBRAMYCIN) 100 MG capsule Take 100 mg by mouth 2 (two) times daily. 11/29/20  Yes [provider]  famotidine (PEPCID) 20 MG tablet Take 1 tablet (20 mg total) by mouth 2 (two) times daily. 04/29/20  Yes Domenick Gong, MD  fluticasone (FLONASE) 50 MCG/ACT nasal spray Place 1 spray into  both nostrils daily. 07/07/20  Yes Candis Schatz, PA-C  hydrOXYzine (ATARAX/VISTARIL) 50 MG tablet Take 50 mg by mouth 2 (two) times daily. 12/09/20  Yes [provider]  mirtazapine (REMERON) 15 MG tablet Take 15 mg by mouth at bedtime.   Yes [provider]  OLANZapine (ZYPREXA) 10 MG tablet Take 10 mg by mouth at bedtime.   Yes [provider]  prazosin (MINIPRESS) 2 MG capsule Take 4 mg by mouth at bedtime.   Yes [provider]  traZODone (DESYREL) 100 MG tablet Take 100 mg by mouth at bedtime.   Yes [provider]  ferrous sulfate (FEROSUL) 325 (65 FE) MG tablet Take 325 mg by mouth daily with breakfast.  04/29/20  [provider]  LYBALVI 10-10 MG TABS Take 1 tablet by mouth daily as needed. 12/09/20   [provider]  naproxen (NAPROSYN) 500 MG tablet Take 500 mg by mouth daily as needed. 11/29/20   [provider]  pantoprazole (PROTONIX) 40 MG tablet Take 1 tablet (40 mg total) by mouth daily. 04/29/20   Domenick Gong, MD  Sertraline HCl 150 MG CAPS Take 1 capsule by mouth daily as needed. 12/09/20   [provider]  SUMAtriptan (IMITREX) 50 MG tablet Take 50 mg by mouth every 2 (two) hours as needed. 11/29/20   [provider]  eszopiclone (LUNESTA) 2 MG TABS tablet Take 2 mg by mouth at bedtime as needed for sleep. Take immediately before bedtime  04/29/20  [provider]  FLUoxetine (PROZAC) 40 MG capsule Take 40 mg by mouth daily.  10/08/19  [provider]  lurasidone (LATUDA) 40 MG TABS tablet Take 40 mg by mouth daily with breakfast.  10/08/19  [provider]      VITAL SIGNS:  Blood pressure (!) 143/98, pulse 81, temperature 98.6 F (37 C), temperature source Oral, resp. rate 17, height 4\' 11"  (1.499 m), weight 90.7 kg, last menstrual period 11/06/2020, SpO2 98 %.  PHYSICAL EXAMINATION:  Physical Exam  GENERAL:  40 y.o.-year-old Caucasian female patient lying in  the bed with no acute distress.  EYES: Pupils equal, round, reactive to light and accommodation. No scleral icterus. Extraocular muscles intact.  HEENT: Head atraumatic, normocephalic. Oropharynx and nasopharynx clear.  NECK:  Supple, no jugular venous distention. No thyroid enlargement, no tenderness.  LUNGS: Normal breath sounds bilaterally, no wheezing, rales,rhonchi or crepitation. No use of accessory muscles of respiration.  CARDIOVASCULAR: Regular rate and rhythm, S1, S2 normal. No murmurs, rubs, or gallops.  ABDOMEN: Soft, nondistended, nontender. Bowel sounds present. No organomegaly or mass.  EXTREMITIES: No pedal edema, cyanosis, or clubbing.  NEUROLOGIC: Cranial nerves II through XII are intact. Muscle strength 5/5 in all extremities. Sensation intact. Gait not checked.  PSYCHIATRIC: The patient is alert and oriented x 3.  Normal affect and good eye contact. SKIN: No obvious rash, lesion, or ulcer.   LABORATORY PANEL:   CBC Recent Labs  Lab 12/15/20 1506  WBC 9.9  HGB 13.7  HCT 40.9  PLT 266   ------------------------------------------------------------------------------------------------------------------  Chemistries  Recent Labs  Lab 12/15/20 1506  NA 139  K 3.9  CL 105  CO2 25  GLUCOSE 153*  BUN 9  CREATININE 0.74  CALCIUM 9.7   ------------------------------------------------------------------------------------------------------------------  Cardiac Enzymes No results for input(s): TROPONINI in the last 168 hours. ------------------------------------------------------------------------------------------------------------------  RADIOLOGY:  MR BRAIN WO CONTRAST  Result Date: 12/15/2020 CLINICAL DATA:  Initial evaluation for bilateral papilledema, diplopia. EXAM: MRI HEAD WITHOUT CONTRAST MRV HEAD WITHOUT AND WITH CONTRAST TECHNIQUE: Multiplanar, multiecho pulse sequences of the brain and surrounding structures were obtained without and with intravenous  contrast. Angiographic images of the intracranial venous structures were obtained using MRV technique without intravenous contrast. CONTRAST:  4mL GADAVIST GADOBUTROL 1 MMOL/ML IV SOLN COMPARISON:  None. FINDINGS: MRI HEAD FINDINGS: Brain: Cerebral volume within normal limits for patient age. No focal parenchymal signal abnormality identified. No abnormal foci of restricted diffusion to suggest acute or subacute ischemia. Gray-white matter differentiation well maintained. No encephalomalacia to suggest chronic infarction. No foci of susceptibility artifact to suggest acute or chronic intracranial hemorrhage. No mass lesion, midline shift or mass effect. No hydrocephalus. No extra-axial fluid collection. Major dural sinuses are grossly patent. Incidental note made of a partially empty sella. Suprasellar region normal. Midline structures intact. Vascular: Major intracranial vascular flow voids well maintained and normal in appearance. Skull and upper cervical spine: Craniocervical junction normal. Visualized upper cervical spine within normal limits. Bone marrow signal intensity normal. No scalp soft tissue abnormality. Sinuses/Orbits: Subtle bulging of the optic discs at the posterior globes, likely reflecting papilledema as provided in the history. Globes orbital soft tissues demonstrate no other acute finding. Scattered mucosal thickening noted about the ethmoidal air cells and maxillary sinuses. Paranasal sinuses are otherwise clear. No significant mastoid effusion. Inner ear structures grossly normal. Other: None. MRV HEAD FINDINGS: Normal flow related signal and enhancement seen throughout the superior sagittal sinus to the torcula. Transverse and sigmoid sinuses are patent as are the visualized proximal internal jugular veins. Focal stenoses at the junctions of the transverse and sigmoid sinuses noted bilaterally (series 8, image 1). Straight sinus, vein of Galen, internal cerebral veins, and basal veins of  Rosenthal appear patent. No visible abnormality about the cavernous sinus. Superior orbital veins symmetric and within normal limits. No dural sinus thrombosis. No appreciable cortical venous thrombosis. IMPRESSION: 1. Partially empty sella with focal stenoses at the junctions of the transverse and sigmoid sinuses bilaterally. Findings are nonspecific, but can be seen in the setting of idiopathic intracranial hypertension. Correlation with LP and opening pressure suggested. 2. Subtle bulging of the optic discs at the posterior globes bilaterally, consistent with history of papilledema. 3. Otherwise normal MRI of the brain and normal intracranial MRV. No other acute intracranial abnormality. No evidence for dural sinus thrombosis. Electronically Signed   By: Rise Mu M.D.   On: 12/15/2020 22:19   MR MRV HEAD W WO CONTRAST  Result Date: 12/15/2020 CLINICAL DATA:  Initial evaluation for bilateral papilledema, diplopia. EXAM: MRI HEAD WITHOUT CONTRAST MRV HEAD WITHOUT AND WITH CONTRAST TECHNIQUE: Multiplanar, multiecho pulse sequences of the brain and surrounding structures were obtained without and with intravenous contrast. Angiographic images of the intracranial venous structures were obtained using MRV technique without intravenous contrast. CONTRAST:  27mL GADAVIST GADOBUTROL 1 MMOL/ML IV SOLN COMPARISON:  None. FINDINGS: MRI HEAD FINDINGS: Brain: Cerebral volume within normal limits for patient age. No focal parenchymal signal abnormality identified. No abnormal foci of restricted diffusion to suggest acute or subacute ischemia. Gray-white matter differentiation well  maintained. No encephalomalacia to suggest chronic infarction. No foci of susceptibility artifact to suggest acute or chronic intracranial hemorrhage. No mass lesion, midline shift or mass effect. No hydrocephalus. No extra-axial fluid collection. Major dural sinuses are grossly patent. Incidental note made of a partially empty sella.  Suprasellar region normal. Midline structures intact. Vascular: Major intracranial vascular flow voids well maintained and normal in appearance. Skull and upper cervical spine: Craniocervical junction normal. Visualized upper cervical spine within normal limits. Bone marrow signal intensity normal. No scalp soft tissue abnormality. Sinuses/Orbits: Subtle bulging of the optic discs at the posterior globes, likely reflecting papilledema as provided in the history. Globes orbital soft tissues demonstrate no other acute finding. Scattered mucosal thickening noted about the ethmoidal air cells and maxillary sinuses. Paranasal sinuses are otherwise clear. No significant mastoid effusion. Inner ear structures grossly normal. Other: None. MRV HEAD FINDINGS: Normal flow related signal and enhancement seen throughout the superior sagittal sinus to the torcula. Transverse and sigmoid sinuses are patent as are the visualized proximal internal jugular veins. Focal stenoses at the junctions of the transverse and sigmoid sinuses noted bilaterally (series 8, image 1). Straight sinus, vein of Galen, internal cerebral veins, and basal veins of Rosenthal appear patent. No visible abnormality about the cavernous sinus. Superior orbital veins symmetric and within normal limits. No dural sinus thrombosis. No appreciable cortical venous thrombosis. IMPRESSION: 1. Partially empty sella with focal stenoses at the junctions of the transverse and sigmoid sinuses bilaterally. Findings are nonspecific, but can be seen in the setting of idiopathic intracranial hypertension. Correlation with LP and opening pressure suggested. 2. Subtle bulging of the optic discs at the posterior globes bilaterally, consistent with history of papilledema. 3. Otherwise normal MRI of the brain and normal intracranial MRV. No other acute intracranial abnormality. No evidence for dural sinus thrombosis. Electronically Signed   By: Rise Mu M.D.   On:  12/15/2020 22:19      IMPRESSION AND PLAN:  Active Problems:   Diplopia  1.  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  Patient had COVID-19 about 3 weeks ago.  It is unclear if this is a complication of the disease. -The patient will be admitted to an observation medical monitored bed. - We will obtain IR lumbar puncture with opening pressure and defer timing to IR team's discretion given the unfortunate administration of 1 dose of 40 mg of subcutaneous Lovenox. -Neurochecks will be followed. - She is a symptomatic and I believe she does not need any current isolation for her COVID 19.  2.  Peripheral neuropathy. - We will continue Neurontin.  3.  Depression and anxiety. - We will continue Zyprexa and Lybalvi.  4.  GERD. - We will continue PPI therapy and H2 blocker therapy.  5.  COPD without exacerbation. - We will continue her Combivent.  DVT prophylaxis: SCDs.  We will avoid Lovenox though 1 dose was inadvertently given tonight.  Will defer the timing of the lumbar puncture to the IR team.  Code Status: full code. Family Communication:  The plan of care was discussed in details with the patient (and her father who was with her in the emergency room.). I answered all questions. The patient agreed to proceed with the above mentioned plan. Further management will depend upon hospital course. Disposition Plan: Back to previous home environment Consults called: Neurology follow-up consult.  All the records are reviewed and case discussed with ED provider.  Status is: Observation  The patient remains OBS  appropriate and will d/c before 2 midnights.  Dispo: The patient is from: Home              Anticipated d/c is to: Home              Patient currently is not medically stable to d/c.   Difficult to place patient No   TOTAL TIME TAKING CARE OF THIS PATIENT: 55 minutes.    Hannah Beat M.D on 12/15/2020 at 11:47 PM  [facility]   From 7 PM-7 AM, contact night-coverage [email or web address]  CC: Primary care physician; [facility]

## 2020-12-15 NOTE — Plan of Care (Signed)
On call phone conversation note  Called by Dr. Candis Shine saw ophthalmology today for diplopia for a few days, history of migraines, seen by ophthalmology and sent to the emergency room for bilateral papilledema noted on ophthalmological evaluation outpatient. Blood tests and lumbar puncture after MRI as recommended by ophthalmology. Agree with evaluating the patient with MRI of the brain, MRV to rule out dural venous sinus thrombosis. High-volume spinal tap with opening pressure and if higher than 20 cm of water, goal closing pressure less than 20 desired. Given patient's body habitus-4 foot 11 inch height, 90.7 kg weight-BMI of 40.4, for accurate opening pressure measurement we will recommend IR guided spinal tap.  This will have to be done tomorrow. In the interim, the blood work recommended by ophthalmology should be sent out and patient should be in there for observation. Please ensure that the patient does not get any Lovenox for DVT prophylaxis as that will delay the spinal tap. Follow-up of most of the blood test recommended by ophthalmology will have to be with outpatient ophthalmology and possibly neurological follow-up. Inpatient neurology will be available as needed. Please call with questions.   Discussed my plan with Dr. Larinda Buttery.  -- Milon Dikes, MD Neurologist [facility] Pager: [phone]

## 2020-12-15 NOTE — ED Notes (Signed)
covid

## 2020-12-15 NOTE — ED Notes (Signed)
Dr. Arville Care notified that Lovenox was admin prior to d/c going through. Will communicate in report to next RN that IR needs to be notified prior to LP.

## 2020-12-15 NOTE — ED Triage Notes (Signed)
Pt states she was sent from [facility] for inflammation of the optic nerve. Pt c/o BL double vision for the past 10days states she had covid in june

## 2020-12-15 NOTE — ED Notes (Signed)
Pt. Provided fresh warm blankets. Pop pop at bedside.

## 2020-12-15 NOTE — ED Notes (Signed)
Pt in MRI.

## 2020-12-15 NOTE — ED Provider Notes (Signed)
[facility] Emergency Department Provider Note   ____________________________________________   Event Date/Time   First MD Initiated Contact with Patient 12/15/20 1627     (approximate)  I have reviewed the triage vital signs and the nursing notes.   HISTORY  Chief Complaint Diplopia    HPI Natalie Rose is a 40 y.o. female with past medical history of migraines, major depressive disorder, and PTSD who presents to the ED complaining of double vision.  Patient reports that she has been dealing with double vision for the past [redacted] weeks along with an intermittent headache.  She went to see her ophthalmologist earlier today, where she was found to have papilledema.  She was referred to the ED for further evaluation.  Patient states that double vision is ongoing but she denies any headache currently, also denies any blurry vision, numbness, or weakness.        Past Medical History:  Diagnosis Date   Anxiety    Arthritis    Depression    Mental health disorder    Migraines     Patient Active Problem List   Diagnosis Date Noted   Self-inflicted laceration of wrist ([place]) 06/17/2018   Cannabis abuse 06/17/2018   At risk for intentional self-harm 06/16/2018   Verbalizes suicidal thoughts 06/16/2018   MDD (major depressive disorder), recurrent severe, without psychosis ([place]) 06/16/2018   Borderline personality disorder ([place]) 09/20/2012   PTSD (post-traumatic stress disorder) 09/20/2012   Major depressive disorder, single episode, unspecified 09/20/2012    Past Surgical History:  Procedure Laterality Date   MOUTH SURGERY      Prior to Admission medications   Medication Sig Start Date End Date Taking? Authorizing Provider  ferrous sulfate (FEROSUL) 325 (65 FE) MG tablet Take 325 mg by mouth daily with breakfast.  04/29/20  [provider]  azithromycin (ZITHROMAX Z-PAK) 250 MG tablet Take 2 tablets by mouth the first day followed  by one tablet daily for next 4 days. 07/07/20   Luvenia Redden, PA-C  cetirizine (ZYRTEC) 10 MG tablet Take 1 tablet (10 mg total) by mouth at bedtime. 07/07/20   Luvenia Redden, PA-C  famotidine (PEPCID) 20 MG tablet Take 1 tablet (20 mg total) by mouth 2 (two) times daily. 04/29/20   Melynda Ripple, MD  fluticasone (FLONASE) 50 MCG/ACT nasal spray Place 1 spray into both nostrils daily. 07/07/20   Luvenia Redden, PA-C  hydrOXYzine (ATARAX/VISTARIL) 25 MG tablet Take 1 tablet (25 mg total) by mouth 3 (three) times daily as needed for itching. 11/06/19   Marylene Land, NP  mirtazapine (REMERON) 15 MG tablet Take 15 mg by mouth at bedtime.    [provider]  OLANZapine (ZYPREXA) 10 MG tablet Take 10 mg by mouth at bedtime.    [provider]  pantoprazole (PROTONIX) 40 MG tablet Take 1 tablet (40 mg total) by mouth daily. 04/29/20   Melynda Ripple, MD  prazosin (MINIPRESS) 1 MG capsule Take 1 mg by mouth at bedtime.    [provider]  prazosin (MINIPRESS) 2 MG capsule Take 4 mg by mouth at bedtime.    [provider]  REXULTI 2 MG TABS tablet Take 2 mg by mouth daily. 09/23/19   [provider]  sertraline (ZOLOFT) 100 MG tablet Take by mouth. 03/16/20   [provider]  sucralfate (CARAFATE) 1 GM/10ML suspension Take 10 mLs (1 g total) by mouth 4 (four) times daily. 10 mL before meals and before bedtime  04/29/20   Melynda Ripple, MD  traZODone (DESYREL) 100 MG tablet Take 100 mg by mouth at bedtime.    [provider]  eszopiclone (LUNESTA) 2 MG TABS tablet Take 2 mg by mouth at bedtime as needed for sleep. Take immediately before bedtime  04/29/20  [provider]  FLUoxetine (PROZAC) 40 MG capsule Take 40 mg by mouth daily.  10/08/19  [provider]  lurasidone (LATUDA) 40 MG TABS tablet Take 40 mg by mouth daily with breakfast.  10/08/19  [provider]    Allergies Valium [diazepam]  Family  History  Problem Relation Age of Onset   Osteopenia Mother    Heart failure Mother    Osteoarthritis Mother    Depression Mother    Diabetes Father     Social History Social History   Tobacco Use   Smoking status: Every Day    Packs/day: 1.00    Types: Cigarettes   Smokeless tobacco: Never  Vaping Use   Vaping Use: Never used  Substance Use Topics   Alcohol use: Not Currently   Drug use: Yes    Frequency: 3.0 times per week    Types: Marijuana    Comment: last use December 2021    Review of Systems  Constitutional: No fever/chills Eyes: Positive for diplopia. ENT: No sore throat. Cardiovascular: Denies chest pain. Respiratory: Denies shortness of breath. Gastrointestinal: No abdominal pain.  No nausea, no vomiting.  No diarrhea.  No constipation. Genitourinary: Negative for dysuria. Musculoskeletal: Negative for back pain. Skin: Negative for rash. Neurological: Positive for headaches, negative for focal weakness or numbness.  ____________________________________________   PHYSICAL EXAM:  VITAL SIGNS: ED Triage Vitals  Enc Vitals Group     BP 12/15/20 1459 (!) 143/98     Pulse Rate 12/15/20 1459 81     Resp 12/15/20 1459 17     Temp 12/15/20 1459 98.6 F (37 C)     Temp Source 12/15/20 1459 Oral     SpO2 12/15/20 1459 98 %     Weight 12/15/20 1457 200 lb (90.7 kg)     Height 12/15/20 1457 _0  (1.499 m)     Head Circumference --      Peak Flow --      Pain Score 12/15/20 1457 0     Pain Loc --      Pain Edu? --      Excl. in [place]? --     Constitutional: Alert and oriented. Eyes: Conjunctivae are normal.  Pupils dilated, equal, round, and reactive to light bilaterally.  Extraocular movements intact. Head: Atraumatic. Nose: No congestion/rhinnorhea. Mouth/Throat: Mucous membranes are moist. Neck: Normal ROM Cardiovascular: Normal rate, regular rhythm. Grossly normal heart sounds. Respiratory: Normal respiratory effort.  No retractions. Lungs  CTAB. Gastrointestinal: Soft and nontender. No distention. Genitourinary: deferred Musculoskeletal: No lower extremity tenderness nor edema. Neurologic:  Normal speech and language. No gross focal neurologic deficits are appreciated. Skin:  Skin is warm, dry and intact. No rash noted. Psychiatric: Mood and affect are normal. Speech and behavior are normal.  ____________________________________________   LABS (all labs ordered are listed, but only abnormal results are displayed)  Labs Reviewed  RESP PANEL BY RT-PCR (FLU A&B, COVID) ARPGX2 - Abnormal; Notable for the following components:      Result Value   SARS Coronavirus 2 by RT PCR POSITIVE (*)    All other components within normal limits  CBC WITH DIFFERENTIAL/PLATELET - Abnormal; Notable for the following components:  Abs Immature Granulocytes 0.10 (*)    All other components within normal limits  BASIC METABOLIC PANEL - Abnormal; Notable for the following components:   Glucose, Bld 153 (*)    All other components within normal limits  SEDIMENTATION RATE  BARTONELLA ANITBODY PANEL  RPR  LYME DISEASE, WESTERN BLOT  POC URINE PREG, ED    PROCEDURES  Procedure(s) performed (including Critical Care):  Procedures   ____________________________________________   INITIAL IMPRESSION / ASSESSMENT AND PLAN / ED COURSE      40 year old female with past medical history of migraines, major depressive disorder, and PTSD who presents to the ED complaining of diplopia for the past 2 weeks, was noted to have papilledema by ophthalmology prior to arrival.  Case reviewed with Dr. Wallace Going of ophthalmology, who requests further evaluation with RPR, Lyme titer, Bartonella, lumbar puncture with opening pressure, and MRI.  Case also discussed with Dr. Rory Percy of neurology, who additionally request that we check MRV brain.  We plan to admit for lumbar puncture and further neurologic evaluation following MRI results, basic labs are  unremarkable, ESR within normal limits and I doubt acute infectious process.  Case discussed with hospitalist for admission.      ____________________________________________   FINAL CLINICAL IMPRESSION(S) / ED DIAGNOSES  Final diagnoses:  Papilledema  Diplopia     ED Discharge Orders     None        Note:  This document was prepared using Dragon voice recognition software and may include unintentional dictation errors.    Blake Divine, MD 12/15/20 2156

## 2020-12-16 DIAGNOSIS — H532 Diplopia: Secondary | ICD-10-CM | POA: Diagnosis not present

## 2020-12-16 LAB — COMPREHENSIVE METABOLIC PANEL
ALT: 40 U/L (ref 0–44)
AST: 31 U/L (ref 15–41)
Albumin: 3.9 g/dL (ref 3.5–5.0)
Alkaline Phosphatase: 68 U/L (ref 38–126)
Anion gap: 7 (ref 5–15)
BUN: 10 mg/dL (ref 6–20)
CO2: 27 mmol/L (ref 22–32)
Calcium: 9.3 mg/dL (ref 8.9–10.3)
Chloride: 106 mmol/L (ref 98–111)
Creatinine, Ser: 0.59 mg/dL (ref 0.44–1.00)
GFR, Estimated: >60 mL/min (ref >60)
Glucose, Bld: 158 mg/dL — ABNORMAL HIGH (ref 70–99)
Potassium: 3.8 mmol/L (ref 3.5–5.1)
Sodium: 140 mmol/L (ref 135–145)
Total Bilirubin: 0.5 mg/dL (ref 0.3–1.2)
Total Protein: 7.2 g/dL (ref 6.5–8.1)

## 2020-12-16 LAB — CBC WITH DIFFERENTIAL/PLATELET
Abs Immature Granulocytes: 0.08 10*3/uL — ABNORMAL HIGH (ref 0.00–0.07)
Basophils Absolute: 0 10*3/uL (ref 0.0–0.1)
Basophils Relative: 0 %
Eosinophils Absolute: 0.2 10*3/uL (ref 0.0–0.5)
Eosinophils Relative: 3 %
HCT: 38.6 % (ref 36.0–46.0)
Hemoglobin: 13.1 g/dL (ref 12.0–15.0)
Immature Granulocytes: 1 %
Lymphocytes Relative: 37 %
Lymphs Abs: 3.5 10*3/uL (ref 0.7–4.0)
MCH: 29.9 pg (ref 26.0–34.0)
MCHC: 33.9 g/dL (ref 30.0–36.0)
MCV: 88.1 fL (ref 80.0–100.0)
Monocytes Absolute: 0.7 10*3/uL (ref 0.1–1.0)
Monocytes Relative: 8 %
Neutro Abs: 4.9 10*3/uL (ref 1.7–7.7)
Neutrophils Relative %: 51 %
Platelets: 240 10*3/uL (ref 150–400)
RBC: 4.38 MIL/uL (ref 3.87–5.11)
RDW: 13.8 % (ref 11.5–15.5)
WBC: 9.5 10*3/uL (ref 4.0–10.5)
nRBC: 0 % (ref 0.0–0.2)

## 2020-12-16 LAB — HEMOGLOBIN A1C
Hgb A1c MFr Bld: 7.3 % — ABNORMAL HIGH (ref 4.8–5.6)
Mean Plasma Glucose: 162.81 mg/dL

## 2020-12-16 LAB — LIPID PANEL
Cholesterol: 234 mg/dL — ABNORMAL HIGH (ref 0–200)
HDL: 34 mg/dL — ABNORMAL LOW (ref >40)
LDL Cholesterol: 132 mg/dL — ABNORMAL HIGH (ref 0–99)
Total CHOL/HDL Ratio: 6.9 RATIO
Triglycerides: 338 mg/dL — ABNORMAL HIGH (ref <150)
VLDL: 68 mg/dL — ABNORMAL HIGH (ref 0–40)

## 2020-12-16 LAB — HIV ANTIBODY (ROUTINE TESTING W REFLEX): HIV Screen 4th Generation wRfx: NONREACTIVE

## 2020-12-16 LAB — RPR: RPR Ser Ql: NONREACTIVE

## 2020-12-16 MED ORDER — SERTRALINE HCL 50 MG PO TABS
150.0000 mg | ORAL_TABLET | Freq: Every day | ORAL | Status: DC
  Administered 2020-12-17: 150 mg via ORAL
  Filled 2020-12-16 (×2): qty 3

## 2020-12-16 MED ORDER — NICOTINE 21 MG/24HR TD PT24
21.0000 mg | MEDICATED_PATCH | Freq: Every day | TRANSDERMAL | Status: DC
  Administered 2020-12-16 – 2020-12-17 (×2): 21 mg via TRANSDERMAL
  Filled 2020-12-16 (×2): qty 1

## 2020-12-16 MED ORDER — OLANZAPINE 10 MG PO TABS
10.0000 mg | ORAL_TABLET | Freq: Every day | ORAL | Status: DC
  Administered 2020-12-16: 10 mg via ORAL
  Filled 2020-12-16 (×2): qty 1

## 2020-12-16 MED ORDER — FLUTICASONE PROPIONATE 50 MCG/ACT NA SUSP
1.0000 | Freq: Every day | NASAL | Status: DC | PRN
  Filled 2020-12-16: qty 16

## 2020-12-16 MED ORDER — MORPHINE SULFATE (PF) 2 MG/ML IV SOLN: 2.0000 mg | INTRAVENOUS | Status: DC | PRN

## 2020-12-16 MED ORDER — SERTRALINE HCL 50 MG PO TABS: 150.0000 mg | ORAL_TABLET | Freq: Every day | ORAL | Status: DC

## 2020-12-16 MED ORDER — LORAZEPAM 2 MG/ML IJ SOLN
0.5000 mg | INTRAMUSCULAR | Status: DC | PRN
Start: 2020-12-16 — End: 2020-12-17
  Administered 2020-12-17: 0.5 mg via INTRAVENOUS
  Filled 2020-12-16: qty 1

## 2020-12-16 MED ORDER — HYDROXYZINE HCL 25 MG PO TABS: 50.0000 mg | ORAL_TABLET | Freq: Three times a day (TID) | ORAL | Status: DC | PRN

## 2020-12-16 NOTE — ED Notes (Signed)
Pt. Very hungry. This RN confirmed with Dr. Arville Care that pt. Could have a [place] sandwich. MD agreed, will modify diet order. Pt. Given [place] sandwich and extra warm blankets. Updated on POC.

## 2020-12-16 NOTE — ED Notes (Signed)
Pt. Given another [place] sandwich.

## 2020-12-16 NOTE — ED Notes (Addendum)
Pt. Received Lovenox at 2347. Notify IR prior to LP.

## 2020-12-16 NOTE — Progress Notes (Signed)
OT Cancellation Note  Patient Details Name: Natalie Rose MRN: [id number] DOB: Oct 19, 1980   Cancelled Treatment:    Reason Eval/Treat Not Completed: OT screened, no needs identified, will sign off. Per conversation with RN and PT, pt Independent for mobility and ADLs. In room to discuss with pt, pt denies needs. Instructed on home/routines modifications. Pt left with safety sitter in room. Will sign off at this time.   Kathie Dike, M.S. OTR/L  12/16/20, 1:48 PM  ascom [phone]

## 2020-12-16 NOTE — Progress Notes (Signed)
SLP Cancellation Note  Patient Details Name: Natalie Rose MRN: [id number] DOB: 1980-06-16   Cancelled treatment:       Reason Eval/Treat Not Completed: SLP screened, no needs identified, will sign off (chart reviewed; consulted NSG then met w/ pt). Pt denied any difficulty swallowing and is currently on a regular diet; tolerates swallowing pills w/ water per NSG. Pt conversed in conversation w/ SLP and on phone w/ family w/out expressive/receptive deficits noted; pt denied any speech-language deficits. Speech clear. MRI revealed "normal MRI of the brain and normal intracranial MRV. No other acute intracranial abnormality".  No further skilled ST services indicated as pt appears at her baseline. Pt agreed. NSG to reconsult if any change in status while admitted.       Orinda Kenner, MS, CCC-SLP Speech Language Pathologist Rehab Services [phone] [facility] 12/16/2020, 11:13 AM

## 2020-12-16 NOTE — Progress Notes (Signed)
PROGRESS NOTE  Natalie Rose  DOB: 31-Aug-1980  PCP: [facility], [place] [facility] AGT:[id number]  DOA: 12/15/2020  LOS: 0 days  Hospital Day: 2   Chief Complaint  Patient presents with   Diplopia    Brief narrative: Natalie Rose is a 40 y.o. female with PMH significant for morbid obesity, osteoarthritis, anxiety and depression and migraine. Patient presented to the ED on 7/14 with acute onset of diplopia for the last couple of weeks.  3 weeks ago she was diagnosed with COVID but had no respiratory or GI symptoms. 7/14, she had a fundus exam done by an ophthalmologist that revealed papilledema for which she was referred to the ER.    In the ED, she had a blood pressure of 143/98 Labs unremarkable. Urine pregnancy test negative.  COVID PCR positive.  MRI brain and MRV of the head were obtained which showed 1. Partially empty sella with focal stenoses at the junctions of the transverse and sigmoid sinuses bilaterally. Findings are nonspecific, but can be seen in the setting of idiopathic intracranial hypertension. Correlation with LP and opening pressure suggested. 2. Subtle bulging of the optic discs at the posterior globes bilaterally, consistent with history of papilledema. 3. No other acute intracranial abnormality. No evidence for dural sinus Thrombosis.   Neurology consultation was obtained.  Based on MRI, MRV, IR guided lumbar puncture was recommended.  Patient was admitted to hospital service for further evaluation management  Subjective: Patient was seen and examined this morning.  Morbidly obese Caucasian female.  Sitting up in chair.  In tears because he is anxious.  Does not want to stay in the hospital.  But understands the need to stay longer to complete her care.  Assessment/Plan: Suspect idiopathic intracranial hypertension -Presented with diplopia and morbidly obese patient -Noted to have papilledema and eye exam by ophthalmologist as an  outpatient -MRI/evaluate finding as above suggestive of idiopathic intracranial hypertension. -Lumbar puncture recommended by neurology.  Recommended high-volume spinal tap with opening pressure and if higher than 20 cm of water, goal closing pressure less than 20 desired. -Discussed with radiology.  Planned for lumbar puncture tomorrow. -Labs sent by ophthalmology are in process, to follow report as an outpatient.  Psychiatric disorders:  Suicidal ideation/Bipolar disorder/Depression/anxiety/complicated migraine  -continue Zyprexa, Remeron, [place], Zoloft, sumatriptan -multiple suicidal attempts in the past. -ordered suicidal precautions, sitter.  Peripheral neuropathy. -continue Neurontin.  GERD -continue PPI therapy and H2 blocker therapy.  COPD without exacerbation. -continue her Combivent.  Morbid obesity  -Body mass index is 40.4 kg/m. Patient has been advised to make an attempt to improve diet and exercise patterns to aid in weight loss.  Mobility: Encourage ambulation Code Status:   Code Status: Full Code  Nutritional status: Body mass index is 40.4 kg/m.     Diet:  Diet Order             Diet Heart Room service appropriate? Yes; Fluid consistency: Thin  Diet effective now                  DVT prophylaxis: SCDs.  Encourage ambulation   Antimicrobials: None Fluid: Currently on normal saline at 75 mill per hour.  I would stop it at this time. Consultants: Neurology curbside, radiology Family Communication: Called and updated patient's mom  Status is: Inpatient  Remains inpatient appropriate because: Needs to wait for lumbar puncture  Dispo: The patient is from: Home              Anticipated  d/c is to: Home in 1 to 2 days              Patient currently is not medically stable to d/c.   Difficult to place patient No     Infusions:     Scheduled Meds:   stroke: mapping our early stages of recovery book   Does not apply Once   vitamin C  500 mg  Oral Daily   famotidine  20 mg Oral BID   fluticasone  1 spray Each Nare Daily   hydrOXYzine  50 mg Oral BID   Ipratropium-Albuterol  1 puff Inhalation QID   mirtazapine  15 mg Oral QHS   nicotine  21 mg Transdermal Daily   pantoprazole  40 mg Oral Daily   prazosin  4 mg Oral QHS   sertraline  150 mg Oral Daily   traZODone  100 mg Oral QHS   zinc sulfate  220 mg Oral Daily    Antimicrobials: Anti-infectives (From admission, onward)    None       PRN meds: acetaminophen, LORazepam, magnesium hydroxide, morphine injection, OLANZapine-Samidorphan, ondansetron **OR** ondansetron (ZOFRAN) IV, traZODone   Objective: Vitals:   12/16/20 0645 12/16/20 0849  BP:  (!) 139/95  Pulse: 78 93  Resp: (!) 27 20  Temp:  98.2 F (36.8 C)  SpO2: 94% 97%   No intake or output data in the 24 hours ending 12/16/20 1227 Filed Weights   12/15/20 1457  Weight: 90.7 kg   Weight change:  Body mass index is 40.4 kg/m.   Physical Exam: General exam: Pleasant, young Caucasian female.  Not in distress.  Morbidly obese Skin: No rashes, lesions or ulcers. HEENT: Atraumatic, normocephalic, no obvious bleeding Lungs: Clear to auscultation bilaterally CVS: Regular rate and rhythm, no murmur GI/Abd soft, nontender, nondistended, bowel sound present CNS: Alert, awake, oriented x3 Psychiatry: Anxious, in tears Extremities: No pedal edema, no calf tenderness  Data Review: I have personally reviewed the laboratory data and studies available.  Recent Labs  Lab 12/15/20 1506 12/16/20 0610  WBC 9.9 9.5  NEUTROABS 5.5 4.9  HGB 13.7 13.1  HCT 40.9 38.6  MCV 86.3 88.1  PLT 266 240   Recent Labs  Lab 12/15/20 1506 12/16/20 0610  NA 139 140  K 3.9 3.8  CL 105 106  CO2 25 27  GLUCOSE 153* 158*  BUN 9 10  CREATININE 0.74 0.59  CALCIUM 9.7 9.3    F/u labs ordered. Unresulted Labs (From admission, onward)     Start     Ordered   12/16/20 1200  Bartonella anitbody panel  Once,   R         12/16/20 1200   12/16/20 1200  Lyme disease, western blot  Once,   R        12/16/20 1200   12/16/20 0500  CBC with Differential/Platelet  Daily,   STAT      12/15/20 2225   12/16/20 0500  Comprehensive metabolic panel  Daily,   STAT      12/15/20 2225            Signed, Lorin Glass, MD [facility] 12/16/2020

## 2020-12-16 NOTE — Evaluation (Signed)
Physical Therapy Evaluation Patient Details Name: Natalie Rose MRN: [id number] DOB: 08-29-1980 Today's Date: 12/16/2020   History of Present Illness  Per MD notes, pt is a 40 y.o. Caucasian female with medical history significant for osteoarthritis, anxiety and depression and migraine, who presented to the emergency room with acute onset of diplopia for the last couple of weeks.  3 weeks ago she was diagnosed with COVID. MD assessment includes persistent diplopia with papilledema concerning for hepatic intracranial hypertension.  Clinical Impression  Pt was pleasant and willing to participate during the session. She reported feeling anxious during session due to not knowing what is wrong with her and began crying towards end of session.  Vitals after activity included SpO2 at 98%, BP at 119/92 seated, and HR 113bpm which decreased to 107bpm after verbal cueing for breathing pattern. Pt was independent with all functional mobility and was steady while walking. She bent over in standing to put on shoes so she was encouraged to avoid bending over secondary to concerns of increasing intracranial pressure. No skilled PT services needed at this time. Will complete PT orders at this time; please re-consult PT if pt's status changes and acute PT needs are identified.      Follow Up Recommendations No PT follow up    Equipment Recommendations  None recommended by PT    Recommendations for Other Services       Precautions / Restrictions Precautions Precautions: None Restrictions Weight Bearing Restrictions: No      Mobility  Bed Mobility Overal bed mobility: Independent                  Transfers Overall transfer level: Independent Equipment used: None             General transfer comment: Good eccentric/concentric control  Ambulation/Gait Ambulation/Gait assistance: Independent Gait Distance (Feet): 50 Feet Assistive device: None Gait Pattern/deviations:  WFL(Within Functional Limits) Gait velocity: baseline   General Gait Details: Pt steady walking within room with no AD. Reports feeling at baseline with and no difficulty walking secondary to diplopia.  Stairs            Wheelchair Mobility    Modified Rankin (Stroke Patients Only)       Balance Overall balance assessment: Independent                                           Pertinent Vitals/Pain Pain Assessment: No/denies pain    Home Living Family/patient expects to be discharged to:: Private residence Living Arrangements: Alone Available Help at Discharge: Family;Friend(s);Available PRN/intermittently Type of Home: Apartment (2nd story apartment) Home Access: Stairs to enter Entrance Stairs-Rails: Lawyer of Steps: 1 flight of stairs Home Layout: One level Home Equipment: None      Prior Function Level of Independence: Independent         Comments: Independent with ambulation and all ADLs     Hand Dominance        Extremity/Trunk Assessment   Upper Extremity Assessment Upper Extremity Assessment: Overall WFL for tasks assessed (Grossly at least 4/5)    Lower Extremity Assessment Lower Extremity Assessment: Overall WFL for tasks assessed (Grossly at least 4/5)    Cervical / Trunk Assessment Cervical / Trunk Assessment: Normal  Communication   Communication: No difficulties  Cognition Arousal/Alertness: Awake/alert Behavior During Therapy: WFL for tasks assessed/performed;Anxious Overall Cognitive Status: Within  Functional Limits for tasks assessed                                 General Comments: Pt reports being anxious about current situation and not know what is wrong. She began crying towards end session and expressing she wanted to go home.      General Comments      Exercises Other Exercises Other Exercises: Education provided verbally on avoiding bent over positions  secondary to concerns of increasing intracranial pressure.   Assessment/Plan    PT Assessment Patent does not need any further PT services  PT Problem List         PT Treatment Interventions      PT Goals (Current goals can be found in the Care Plan section)  Acute Rehab PT Goals Patient Stated Goal: Go home PT Goal Formulation: With patient Time For Goal Achievement: 12/29/20 Potential to Achieve Goals: Good    Frequency     Barriers to discharge        Co-evaluation               AM-PAC PT "6 Clicks" Mobility  Outcome Measure Help needed turning from your back to your side while in a flat bed without using bedrails?: None Help needed moving from lying on your back to sitting on the side of a flat bed without using bedrails?: None Help needed moving to and from a bed to a chair (including a wheelchair)?: None Help needed standing up from a chair using your arms (e.g., wheelchair or bedside chair)?: None Help needed to walk in hospital room?: None Help needed climbing 3-5 steps with a railing? : None 6 Click Score: 24    End of Session   Activity Tolerance: Patient tolerated treatment well Patient left: in chair;with call bell/phone within reach Nurse Communication: Mobility status (Notified that pt was in chair) PT Visit Diagnosis: Unsteadiness on feet (R26.81)    Time: 6578-4696 PT Time Calculation (min) (ACUTE ONLY): 21 min   Charges:              Desiree Hane SPT 12/16/20, 1:50 PM

## 2020-12-16 NOTE — Care Management Obs Status (Signed)
MEDICARE OBSERVATION STATUS NOTIFICATION   Patient Details  Name: Natalie Rose MRN: [id number] Date of Birth: 24-Aug-1980   Medicare Observation Status Notification Given:  Yes    Allayne Butcher, RN 12/16/2020, 2:04 PM

## 2020-12-17 ENCOUNTER — Observation Stay: Payer: Medicare Other

## 2020-12-17 DIAGNOSIS — H532 Diplopia: Secondary | ICD-10-CM | POA: Diagnosis not present

## 2020-12-17 LAB — CBC WITH DIFFERENTIAL/PLATELET
Abs Immature Granulocytes: 0.07 10*3/uL (ref 0.00–0.07)
Basophils Absolute: 0 10*3/uL (ref 0.0–0.1)
Basophils Relative: 0 %
Eosinophils Absolute: 0.2 10*3/uL (ref 0.0–0.5)
Eosinophils Relative: 2 %
HCT: 39 % (ref 36.0–46.0)
Hemoglobin: 13.3 g/dL (ref 12.0–15.0)
Immature Granulocytes: 1 %
Lymphocytes Relative: 31 %
Lymphs Abs: 3.8 10*3/uL (ref 0.7–4.0)
MCH: 29.4 pg (ref 26.0–34.0)
MCHC: 34.1 g/dL (ref 30.0–36.0)
MCV: 86.3 fL (ref 80.0–100.0)
Monocytes Absolute: 1 10*3/uL (ref 0.1–1.0)
Monocytes Relative: 8 %
Neutro Abs: 7.1 10*3/uL (ref 1.7–7.7)
Neutrophils Relative %: 58 %
Platelets: 255 10*3/uL (ref 150–400)
RBC: 4.52 MIL/uL (ref 3.87–5.11)
RDW: 13.6 % (ref 11.5–15.5)
WBC: 12.2 10*3/uL — ABNORMAL HIGH (ref 4.0–10.5)
nRBC: 0 % (ref 0.0–0.2)

## 2020-12-17 LAB — CSF CELL COUNT WITH DIFFERENTIAL
Eosinophils, CSF: 1 %
Lymphs, CSF: 80 %
Monocyte-Macrophage-Spinal Fluid: 19 %
RBC Count, CSF: 3 /mm3 (ref 0–3)
Tube #: 3
WBC, CSF: 5 /mm3 (ref 0–5)

## 2020-12-17 LAB — COMPREHENSIVE METABOLIC PANEL
ALT: 44 U/L (ref 0–44)
AST: 34 U/L (ref 15–41)
Albumin: 4.1 g/dL (ref 3.5–5.0)
Alkaline Phosphatase: 63 U/L (ref 38–126)
Anion gap: 9 (ref 5–15)
BUN: 14 mg/dL (ref 6–20)
CO2: 27 mmol/L (ref 22–32)
Calcium: 9.7 mg/dL (ref 8.9–10.3)
Chloride: 103 mmol/L (ref 98–111)
Creatinine, Ser: 0.66 mg/dL (ref 0.44–1.00)
GFR, Estimated: >60 mL/min (ref >60)
Glucose, Bld: 161 mg/dL — ABNORMAL HIGH (ref 70–99)
Potassium: 3.3 mmol/L — ABNORMAL LOW (ref 3.5–5.1)
Sodium: 139 mmol/L (ref 135–145)
Total Bilirubin: 0.6 mg/dL (ref 0.3–1.2)
Total Protein: 7.6 g/dL (ref 6.5–8.1)

## 2020-12-17 LAB — GLUCOSE, CSF: Glucose, CSF: 105 mg/dL — ABNORMAL HIGH (ref 40–70)

## 2020-12-17 LAB — PROTEIN, CSF: Total  Protein, CSF: 30 mg/dL (ref 15–45)

## 2020-12-17 MED ORDER — METFORMIN HCL 500 MG PO TABS: 500.0000 mg | ORAL_TABLET | Freq: Two times a day (BID) | ORAL | 0 refills | Status: DC

## 2020-12-17 MED ORDER — LIDOCAINE HCL (PF) 1 % IJ SOLN
5.0000 mL | Freq: Once | INTRAMUSCULAR | Status: AC
  Administered 2020-12-17: 5 mL

## 2020-12-17 NOTE — Procedures (Signed)
Interventional Radiology Procedure Note  Procedure: L3-L4 lumbar puncture yielding 11 mL clear CSF.  Normal opening pressure.   Complications: None  Estimated Blood Loss: None  Recommendations: - Labs sent - Bedrest x 1 hour   Signed,  Sterling Big, MD

## 2020-12-17 NOTE — Progress Notes (Signed)
Pt went to X-Ray to have her spinal tap procedure done. Pt was anxious but through constant reassurance by x-ray tech, MD and writer, pt remained calm and cooperative. Pt now back in her room, has been told to remain in bed, will continue to monitor pt.

## 2020-12-17 NOTE — Progress Notes (Signed)
Pt was notified about the plan for today- having a small procedure done. Pt was visibly upset by having shaking hands, teary eyes and lips quivering. This Clinical research associate offered emotional support, pt has slightly calmed down, RN aware of pt's mood. Will continue to monitor pt.

## 2020-12-17 NOTE — Discharge Summary (Addendum)
Physician Discharge Summary  Tamari Redwine HEN:[id number] DOB: June 11, 1980 DOA: 12/15/2020  PCP: [facility]  Admit date: 12/15/2020 Discharge date: 12/17/2020  Admitted From: Home Discharge disposition: Discharge back to home   Code Status: Full Code  Diet Recommendation: Regular diet  Discharge Diagnosis:   Active Problems:   Diplopia    Chief Complaint  Patient presents with   Diplopia    Brief narrative: Natalie Rose is a 40 y.o. female with PMH significant for morbid obesity, osteoarthritis, anxiety and depression and migraine. Patient presented to the ED on 7/14 with acute onset of diplopia for the last couple of weeks.  3 weeks ago she was diagnosed with COVID but had no respiratory or GI symptoms. 7/14, she had a fundus exam done by an ophthalmologist that revealed papilledema for which she was referred to the ER.    In the ED, she had a blood pressure of 143/98 Labs unremarkable. Urine pregnancy test negative.  COVID PCR positive.  MRI brain and MRV of the head were obtained which showed 1. Partially empty sella with focal stenoses at the junctions of the transverse and sigmoid sinuses bilaterally. Findings are nonspecific, but can be seen in the setting of idiopathic intracranial hypertension. Correlation with LP and opening pressure suggested. 2. Subtle bulging of the optic discs at the posterior globes bilaterally, consistent with history of papilledema. 3. No other acute intracranial abnormality. No evidence for dural sinus Thrombosis.   Neurology consultation was obtained.  Based on MRI, MRV, IR guided lumbar puncture was recommended.  Patient was admitted to hospital service for further evaluation management  Subjective: Patient was seen and examined this morning.  Underwent lumbar puncture this morning.  Opening pressure less than 25.  Only 11 mm of CSF drained.  Discussed with neurologist Dr. Wilford Corner informally.  Can discharge  to home to follow-up with neurology as an outpatient.  Assessment/Plan: Diplopia -Initially suspected to have idiopathic intracranial hypertension -Noted to have papilledema and eye exam by ophthalmologist as an outpatient -MRI/evaluate finding as above suggestive of idiopathic intracranial hypertension. -7/16, lumbar puncture done by IR.  Opening pressure normal.  11 mL of CSF drained.   -Intracranial hypertension ruled out.  Diplopia persist.  Outpatient work-up sent by ophthalmology pending.   -Per neurology recommendation, at this time, patient does not need inpatient work-up.  She can be discharged home to follow-up with neurology and ophthalmology as an outpatient.    Psychiatric disorders:  Suicidal ideation/Bipolar disorder/Depression/anxiety/complicated migraine  -continue Zyprexa, Remeron, [place], Zoloft, sumatriptan -multiple suicidal attempts in the past. -Sitter provided while in the hospital.  Peripheral neuropathy. -continue Neurontin.  GERD -continue PPI therapy and H2 blocker therapy.  COPD without exacerbation. -continue her Combivent.  DM2 -newly diagnosed. A1c 7.3.  -d/w patient's mother. Start on metformin 500 mg BID.  -f/u with PCP as an outpatient.   Morbid obesity  -Body mass index is 40.4 kg/m. Patient has been advised to make an attempt to improve diet and exercise patterns to aid in weight loss.  Okay to discharge home today.  Discussed with patient's mom.   Allergies as of 12/17/2020       Reactions   Lurasidone    Valium [diazepam] Hives        Medication List     STOP taking these medications    doxycycline 100 MG capsule Commonly known as: VIBRAMYCIN   famotidine 20 MG tablet Commonly known as: PEPCID   fluticasone 50 MCG/ACT nasal spray Commonly  known as: FLONASE   OLANZapine 10 MG tablet Commonly known as: ZYPREXA       TAKE these medications    cholecalciferol 25 MCG (1000 UNIT) tablet Commonly known as: VITAMIN  D3 Take 5,000 Units by mouth at bedtime.   Combivent Respimat 20-100 MCG/ACT Aers respimat Generic drug: Ipratropium-Albuterol 1 puff 4 (four) times daily.   hydrOXYzine 50 MG tablet Commonly known as: ATARAX/VISTARIL Take 50 mg by mouth 3 (three) times daily as needed.   Lybalvi 10-10 MG Tabs Generic drug: OLANZapine-Samidorphan Take 1 tablet by mouth daily. This is to replace Zyprexa 10 mg daily although she has not started taking yet   metFORMIN 500 MG tablet Commonly known as: Glucophage Take 1 tablet (500 mg total) by mouth 2 (two) times daily with a meal.   mirtazapine 15 MG tablet Commonly known as: REMERON Take 15 mg by mouth at bedtime.   naproxen 500 MG tablet Commonly known as: NAPROSYN Take 500 mg by mouth daily as needed.   pantoprazole 40 MG tablet Commonly known as: PROTONIX Take 1 tablet (40 mg total) by mouth daily.   prazosin 2 MG capsule Commonly known as: MINIPRESS Take 4 mg by mouth at bedtime.   Sertraline HCl 150 MG Caps Take 1 capsule by mouth daily.   SUMAtriptan 50 MG tablet Commonly known as: IMITREX Take 50 mg by mouth every 2 (two) hours as needed.   traZODone 100 MG tablet Commonly known as: DESYREL Take 100 mg by mouth at bedtime.        Discharge Instructions:  Follow with Primary MD [facility] in 7 days   Get CBC/BMP checked in next visit within 1 week by PCP or SNF MD ( we routinely change or add medications that can affect your baseline labs and fluid status, therefore we recommend that you get the mentioned basic workup next visit with your PCP, your PCP may decide not to get them or add new tests based on their clinical decision)  On your next visit with your PCP, please Get Medicines reviewed and adjusted.  Please request your PCP  to go over all Hospital Tests and Procedure/Radiological results at the follow up, please get all Hospital records sent to your Prim MD by signing hospital release before you go  home.  Activity: As tolerated with Full fall precautions use walker/cane & assistance as needed  For Heart failure patients - Check your Weight same time everyday, if you gain over 2 pounds, or you develop in leg swelling, experience more shortness of breath or chest pain, call your Primary MD immediately. Follow Cardiac Low Salt Diet and 1.5 lit/day fluid restriction.  If you have smoked or chewed Tobacco in the last 2 yrs please stop smoking, stop any regular Alcohol  and or any Recreational drug use.  If you experience worsening of your admission symptoms, develop shortness of breath, life threatening emergency, suicidal or homicidal thoughts you must seek medical attention immediately by calling 911 or calling your MD immediately  if symptoms less severe.  You Must read complete instructions/literature along with all the possible adverse reactions/side effects for all the Medicines you take and that have been prescribed to you. Take any new Medicines after you have completely understood and accpet all the possible adverse reactions/side effects.   Do not drive, operate heavy machinery, perform activities at heights, swimming or participation in water activities or provide baby sitting services if your were admitted for syncope or siezures until you have  seen by Primary MD or a Neurologist and advised to do so again.  Do not drive when taking Pain medications.  Do not take more than prescribed Pain, Sleep and Anxiety Medications  Wear Seat belts while driving.   Please note You were cared for by a hospitalist during your hospital stay. If you have any questions about your discharge medications or the care you received while you were in the hospital after you are discharged, you can call the unit and asked to speak with the hospitalist on call if the hospitalist that took care of you is not available. Once you are discharged, your primary care physician will handle any further medical issues.  Please note that NO REFILLS for any discharge medications will be authorized once you are discharged, as it is imperative that you return to your primary care physician (or establish a relationship with a primary care physician if you do not have one) for your aftercare needs so that they can reassess your need for medications and monitor your lab values.    Follow ups:   Discharge Instructions     Diet general   Complete by: As directed    Increase activity slowly   Complete by: As directed        Follow-up Information     [facility] Follow up.   Specialty: Family Medicine Contact information: [address] [place] [address] [phone]         [facility] Follow up.   Why: For neurology appointment Contact information: [address] [place] [address] [phone]         [facility] Follow up.   Specialty: Neurology Contact information: [address] [place] [address] [phone]                Wound care:     Discharge Exam:   Vitals:   12/16/20 1604 12/16/20 2108 12/17/20 0520 12/17/20 0738  BP: (!) 136/95 (!) 131/99 102/74 114/80  Pulse: (!) 105 100 93 92  Resp: Temp: 98.1 F (36.7 C) 98.6 F (37 C) (!) 97.4 F (36.3 C) 98.6 F (37 C)  TempSrc: Oral Oral Oral Oral  SpO2: 98% 97% 92% 93%  Weight:      Height:        Body mass index is 40.4 kg/m.  General exam: Pleasant, young Caucasian female.  Not in distress Skin: No rashes, lesions or ulcers. HEENT: Atraumatic, normocephalic, no obvious bleeding Lungs: Clear to auscultation bilaterally CVS: Regular rate and rhythm, no murmur GI/Abd soft, nontender, nondistended, bowel sound present CNS: Alert, awake, oriented x3, continues to have diplopia Psychiatry: Anxious Extremities: No pedal edema, no calf tenderness  Time coordinating discharge: 35 minutes   The results of significant  diagnostics from this hospitalization (including imaging, microbiology, ancillary and laboratory) are listed below for reference.    Procedures and Diagnostic Studies:   MR BRAIN WO CONTRAST  Result Date: 12/15/2020 CLINICAL DATA:  Initial evaluation for bilateral papilledema, diplopia. EXAM: MRI HEAD WITHOUT CONTRAST MRV HEAD WITHOUT AND WITH CONTRAST TECHNIQUE: Multiplanar, multiecho pulse sequences of the brain and surrounding structures were obtained without and with intravenous contrast. Angiographic images of the intracranial venous structures were obtained using MRV technique without intravenous contrast. CONTRAST:  9mL GADAVIST GADOBUTROL 1 MMOL/ML IV SOLN COMPARISON:  None. FINDINGS: MRI HEAD FINDINGS: Brain: Cerebral volume within normal limits for patient age. No focal parenchymal signal abnormality  identified. No abnormal foci of restricted diffusion to suggest acute or subacute ischemia. Gray-white matter differentiation well maintained. No encephalomalacia to suggest chronic infarction. No foci of susceptibility artifact to suggest acute or chronic intracranial hemorrhage. No mass lesion, midline shift or mass effect. No hydrocephalus. No extra-axial fluid collection. Major dural sinuses are grossly patent. Incidental note made of a partially empty sella. Suprasellar region normal. Midline structures intact. Vascular: Major intracranial vascular flow voids well maintained and normal in appearance. Skull and upper cervical spine: Craniocervical junction normal. Visualized upper cervical spine within normal limits. Bone marrow signal intensity normal. No scalp soft tissue abnormality. Sinuses/Orbits: Subtle bulging of the optic discs at the posterior globes, likely reflecting papilledema as provided in the history. Globes orbital soft tissues demonstrate no other acute finding. Scattered mucosal thickening noted about the ethmoidal air cells and maxillary sinuses. Paranasal sinuses are otherwise  clear. No significant mastoid effusion. Inner ear structures grossly normal. Other: None. MRV HEAD FINDINGS: Normal flow related signal and enhancement seen throughout the superior sagittal sinus to the torcula. Transverse and sigmoid sinuses are patent as are the visualized proximal internal jugular veins. Focal stenoses at the junctions of the transverse and sigmoid sinuses noted bilaterally (series 8, image 1). Straight sinus, vein of Galen, internal cerebral veins, and basal veins of Rosenthal appear patent. No visible abnormality about the cavernous sinus. Superior orbital veins symmetric and within normal limits. No dural sinus thrombosis. No appreciable cortical venous thrombosis. IMPRESSION: 1. Partially empty sella with focal stenoses at the junctions of the transverse and sigmoid sinuses bilaterally. Findings are nonspecific, but can be seen in the setting of idiopathic intracranial hypertension. Correlation with LP and opening pressure suggested. 2. Subtle bulging of the optic discs at the posterior globes bilaterally, consistent with history of papilledema. 3. Otherwise normal MRI of the brain and normal intracranial MRV. No other acute intracranial abnormality. No evidence for dural sinus thrombosis. Electronically Signed   By: Rise Mu M.D.   On: 12/15/2020 22:19   MR MRV HEAD W WO CONTRAST  Result Date: 12/15/2020 CLINICAL DATA:  Initial evaluation for bilateral papilledema, diplopia. EXAM: MRI HEAD WITHOUT CONTRAST MRV HEAD WITHOUT AND WITH CONTRAST TECHNIQUE: Multiplanar, multiecho pulse sequences of the brain and surrounding structures were obtained without and with intravenous contrast. Angiographic images of the intracranial venous structures were obtained using MRV technique without intravenous contrast. CONTRAST:  25mL GADAVIST GADOBUTROL 1 MMOL/ML IV SOLN COMPARISON:  None. FINDINGS: MRI HEAD FINDINGS: Brain: Cerebral volume within normal limits for patient age. No focal  parenchymal signal abnormality identified. No abnormal foci of restricted diffusion to suggest acute or subacute ischemia. Gray-white matter differentiation well maintained. No encephalomalacia to suggest chronic infarction. No foci of susceptibility artifact to suggest acute or chronic intracranial hemorrhage. No mass lesion, midline shift or mass effect. No hydrocephalus. No extra-axial fluid collection. Major dural sinuses are grossly patent. Incidental note made of a partially empty sella. Suprasellar region normal. Midline structures intact. Vascular: Major intracranial vascular flow voids well maintained and normal in appearance. Skull and upper cervical spine: Craniocervical junction normal. Visualized upper cervical spine within normal limits. Bone marrow signal intensity normal. No scalp soft tissue abnormality. Sinuses/Orbits: Subtle bulging of the optic discs at the posterior globes, likely reflecting papilledema as provided in the history. Globes orbital soft tissues demonstrate no other acute finding. Scattered mucosal thickening noted about the ethmoidal air cells and maxillary sinuses. Paranasal sinuses are otherwise clear. No significant mastoid effusion. Inner ear structures grossly  normal. Other: None. MRV HEAD FINDINGS: Normal flow related signal and enhancement seen throughout the superior sagittal sinus to the torcula. Transverse and sigmoid sinuses are patent as are the visualized proximal internal jugular veins. Focal stenoses at the junctions of the transverse and sigmoid sinuses noted bilaterally (series 8, image 1). Straight sinus, vein of Galen, internal cerebral veins, and basal veins of Rosenthal appear patent. No visible abnormality about the cavernous sinus. Superior orbital veins symmetric and within normal limits. No dural sinus thrombosis. No appreciable cortical venous thrombosis. IMPRESSION: 1. Partially empty sella with focal stenoses at the junctions of the transverse and  sigmoid sinuses bilaterally. Findings are nonspecific, but can be seen in the setting of idiopathic intracranial hypertension. Correlation with LP and opening pressure suggested. 2. Subtle bulging of the optic discs at the posterior globes bilaterally, consistent with history of papilledema. 3. Otherwise normal MRI of the brain and normal intracranial MRV. No other acute intracranial abnormality. No evidence for dural sinus thrombosis. Electronically Signed   By: Rise Mu M.D.   On: 12/15/2020 22:19     Labs:   Basic Metabolic Panel: Recent Labs  Lab 12/15/20 1506 12/16/20 0610 12/17/20 0452  NA 139 140 139  K 3.9 3.8 3.3*  CL 105 106 103  CO2 GLUCOSE 153* 158* 161*  BUN CREATININE 0.74 0.59 0.66  CALCIUM 9.7 9.3 9.7   GFR Estimated Creatinine Clearance: 94.5 mL/min (by C-G formula based on SCr of 0.66 mg/dL). Liver Function Tests: Recent Labs  Lab 12/16/20 0610 12/17/20 0452  AST 31 34  ALT 40 44  ALKPHOS 68 63  BILITOT 0.5 0.6  PROT 7.2 7.6  ALBUMIN 3.9 4.1   No results for input(s): LIPASE, AMYLASE in the last 168 hours. No results for input(s): AMMONIA in the last 168 hours. Coagulation profile No results for input(s): INR, PROTIME in the last 168 hours.  CBC: Recent Labs  Lab 12/15/20 1506 12/16/20 0610 12/17/20 0452  WBC 9.9 9.5 12.2*  NEUTROABS 5.5 4.9 7.1  HGB 13.7 13.1 13.3  HCT 40.9 38.6 39.0  MCV 86.3 88.1 86.3  PLT 266 240 255   Cardiac Enzymes: No results for input(s): CKTOTAL, CKMB, CKMBINDEX, TROPONINI in the last 168 hours. BNP: Invalid input(s): POCBNP CBG: No results for input(s): GLUCAP in the last 168 hours. D-Dimer No results for input(s): DDIMER in the last 72 hours. Hgb A1c Recent Labs    12/16/20 0610  HGBA1C 7.3*   Lipid Profile Recent Labs    12/16/20 0610  CHOL 234*  HDL 34*  LDLCALC 132*  TRIG 338*  CHOLHDL 6.9   Thyroid function studies No results for input(s): TSH, T4TOTAL,  T3FREE, THYROIDAB in the last 72 hours.  Invalid input(s): FREET3 Anemia work up No results for input(s): VITAMINB12, FOLATE, FERRITIN, TIBC, IRON, RETICCTPCT in the last 72 hours. Microbiology Recent Results (from the past 240 hour(s))  Resp Panel by RT-PCR (Flu A&B, Covid) Nasopharyngeal Swab     Status: Abnormal   Collection Time: 12/15/20  5:16 PM   Specimen: Nasopharyngeal Swab; Nasopharyngeal(NP) swabs in vial transport medium  Result Value Ref Range Status   SARS Coronavirus 2 by RT PCR POSITIVE (A) NEGATIVE Final    Comment: CRITICAL RESULT CALLED TO, READ BACK BY AND VERIFIED WITH:  Waynette Buttery, RN AT 1900 ON 12/15/20 BY [facility] (NOTE) SARS-CoV-2 target nucleic acids are DETECTED.  The SARS-CoV-2 RNA is generally detectable in upper respiratory specimens during  the acute phase of infection. Positive results are indicative of the presence of the identified virus, but do not rule out bacterial infection or co-infection with other pathogens not detected by the test. Clinical correlation with patient history and other diagnostic information is necessary to determine patient infection status. The expected result is Negative.  Fact Sheet for Patients: BloggerCourse.com  Fact Sheet for Healthcare Providers: SeriousBroker.it  This test is not yet approved or cleared by the [place] FDA and  has been authorized for detection and/or diagnosis of SARS-CoV-2 by FDA under an Emergency Use Authorization (EUA).  This EUA will remain in effect (m eaning this test can be used) for the duration of  the COVID-19 declaration under Section 564(b)(1) of the Act, 21 U.S.C. section 360bbb-3(b)(1), unless the authorization is terminated or revoked sooner.     Influenza A by PCR NEGATIVE NEGATIVE Final   Influenza B by PCR NEGATIVE NEGATIVE Final    Comment: (NOTE) The Xpert Xpress SARS-CoV-2/FLU/RSV plus assay is intended as an aid in  the diagnosis of influenza from Nasopharyngeal swab specimens and should not be used as a sole basis for treatment. Nasal washings and aspirates are unacceptable for Xpert Xpress SARS-CoV-2/FLU/RSV testing.  Fact Sheet for Patients: BloggerCourse.com  Fact Sheet for Healthcare Providers: SeriousBroker.it  This test is not yet approved or cleared by the [place] FDA and has been authorized for detection and/or diagnosis of SARS-CoV-2 by FDA under an Emergency Use Authorization (EUA). This EUA will remain in effect (meaning this test can be used) for the duration of the COVID-19 declaration under Section 564(b)(1) of the Act, 21 U.S.C. section 360bbb-3(b)(1), unless the authorization is terminated or revoked.  Performed at [facility], [address] [place], [place] [address]   CSF culture w Gram Stain     Status: None (Preliminary result)   Collection Time: 12/17/20  9:43 AM   Specimen: PATH Cytology CSF; Cerebrospinal Fluid  Result Value Ref Range Status   Specimen Description CSF  Final   Special Requests NONE  Final   Gram Stain   Final    NO ORGANISMS SEEN RBC SEEN NO WBCS SEEN Performed at [facility], [address], [place], [place] [address]    Culture PENDING  Incomplete   Report Status PENDING  Incomplete     Signed: Lorin Glass  [facility] 12/17/2020, 12:39 PM

## 2020-12-19 LAB — BARTONELLA ANTIBODY PANEL
B Quintana IgM: NEGATIVE titer
B henselae IgG: NEGATIVE titer
B henselae IgM: NEGATIVE titer
B quintana IgG: NEGATIVE titer

## 2020-12-20 LAB — LYME DISEASE, WESTERN BLOT
IgG P18 Ab.: ABSENT
IgG P23 Ab.: ABSENT
IgG P30 Ab.: ABSENT
IgG P41 Ab.: ABSENT
IgG P45 Ab.: ABSENT
IgG P66 Ab.: ABSENT
IgM P23 Ab.: ABSENT
IgM P39 Ab.: ABSENT
IgM P41 Ab.: ABSENT
Lyme IgG Wb: NEGATIVE
Lyme IgM Wb: NEGATIVE

## 2020-12-20 LAB — CSF CULTURE W GRAM STAIN
Culture: NO GROWTH
Gram Stain: NONE SEEN

## 2021-03-08 ENCOUNTER — Ambulatory Visit
Admission: EM | Admit: 2021-03-08 | Discharge: 2021-03-08 | Disposition: A | Discharge status: Home / Self Care | Payer: Medicare Other

## 2021-03-08 ENCOUNTER — Other Ambulatory Visit: Payer: Self-pay

## 2021-03-08 DIAGNOSIS — H00011 Hordeolum externum right upper eyelid: Secondary | ICD-10-CM

## 2021-03-08 HISTORY — DX: Type 2 diabetes mellitus without complications: E11.9

## 2021-03-08 MED ORDER — ERYTHROMYCIN 5 MG/GM OP OINT: TOPICAL_OINTMENT | OPHTHALMIC | 0 refills | Status: DC

## 2021-03-08 NOTE — ED Provider Notes (Signed)
[facility]    CSN: [id number] Arrival date & time: 03/08/21  7616      History   Chief Complaint Chief Complaint  Patient presents with   Eyelid Problem    right    HPI Natalie Rose is a 40 y.o. female presenting for redness, puffiness/swelling or right upper eyelid with pain since yesterday. Admits to most of the pain in the corner of the eye and drainage/crustiness. Mild blurred vision reported due to drainage. No fevers, facial swelling. Denies injury of FB. Does not wear contacts. Has not tried any at home treatments/OTC meds. No other concerns.  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      Home Medications    Prior to Admission medications   Medication Sig Start Date End Date Taking? Authorizing Provider  erythromycin ophthalmic ointment Place a 1/2 inch ribbon of ointment into the lower eyelid q6h 03/08/21  Yes Eusebio Friendly B, PA-C  famotidine (PEPCID) 20 MG tablet SMARTSIG:1 Tablet(s) By Mouth Every Evening 01/20/21  Yes [provider]  hydrOXYzine (ATARAX/VISTARIL) 50 MG tablet Take 50 mg by mouth 3 (three) times daily as needed. 12/09/20  Yes [provider]  LYBALVI  10-10 MG TABS Take 1 tablet by mouth daily. This is to replace Zyprexa 10 mg daily although she has not started taking yet 12/09/20  Yes [provider]  metFORMIN (GLUCOPHAGE) 500 MG tablet Take 1 tablet (500 mg total) by mouth 2 (two) times daily with a meal. 12/17/20 03/08/21 Yes Dahal, Melina Schools, MD  mirtazapine (REMERON) 15 MG tablet Take 15 mg by mouth at bedtime.   Yes [provider]  naproxen (NAPROSYN) 500 MG tablet Take 500 mg by mouth daily as needed. 11/29/20  Yes [provider]  prazosin (MINIPRESS) 2 MG capsule Take 4 mg by mouth at bedtime.   Yes [provider]  Sertraline HCl 150 MG CAPS Take 1 capsule by mouth daily. 12/09/20  Yes [provider]  SUMAtriptan (IMITREX) 50 MG tablet Take 50 mg by mouth every 2 (two) hours as needed. 11/29/20  Yes [provider]  traZODone (DESYREL) 100 MG tablet Take 100 mg by mouth at bedtime.   Yes [provider]  ferrous sulfate (FEROSUL) 325 (65 FE) MG tablet Take 325 mg by mouth daily with breakfast.  04/29/20  [provider]  cholecalciferol (VITAMIN D3) 25 MCG (1000 UNIT) tablet Take 5,000 Units by mouth at bedtime.    [provider]  COMBIVENT RESPIMAT 20-100 MCG/ACT AERS respimat 1 puff 4 (four) times daily. 11/29/20   [provider]  oxyCODONE (OXY IR/ROXICODONE) 5 MG immediate release tablet Take by mouth. 02/03/21   [provider]  pantoprazole (PROTONIX) 40 MG tablet Take 1 tablet (40 mg total) by mouth daily. 04/29/20   Domenick Gong, MD  eszopiclone (LUNESTA) 2 MG TABS tablet Take 2 mg by mouth at bedtime as needed for sleep. Take immediately before bedtime  04/29/20  [provider]  FLUoxetine (PROZAC) 40 MG capsule Take 40 mg by mouth daily.  10/08/19  [provider]  lurasidone (LATUDA) 40 MG TABS tablet Take 40 mg by mouth daily with breakfast.  10/08/19  [provider]    Family History Family History   Problem Relation Age of Onset   Osteopenia Mother    Heart failure Mother    Osteoarthritis Mother    Depression Mother    Diabetes Father     Social History Social History   Tobacco Use   Smoking status: Every Day    Packs/day: 1.00    Types: Cigarettes   Smokeless tobacco: Never  Vaping Use   Vaping Use: Never used  Substance Use Topics   Alcohol use: Not Currently   Drug use: Yes    Frequency: 3.0 times per week    Types: Marijuana    Comment: last use December 2021     Allergies   Diazepam and Lurasidone   Review of Systems Review of Systems  Constitutional:  Negative for fatigue and fever.  HENT:  Negative for congestion and sore throat.   Eyes:  Positive for pain, discharge and redness.       Upper eyelid pain, redness, drainage  Respiratory:  Negative for cough.   Neurological:  Negative for headaches.    Physical Exam Triage Vital Signs ED Triage Vitals  Enc Vitals Group     BP 03/08/21 0957 123/73     Pulse Rate 03/08/21 0957 80     Resp 03/08/21 0957 18     Temp 03/08/21 0957 98 F (36.7 C)     Temp Source 03/08/21 0957 Oral     SpO2 03/08/21 0957 96 %     Weight 03/08/21 0954 200 lb (90.7 kg)     Height 03/08/21 0954 4\' 11"  (1.499 m)     Head Circumference --      Peak Flow --      Pain Score 03/08/21 0954 0     Pain Loc --      Pain Edu? --      Excl. in GC? --    No data found.  Updated Vital Signs BP 123/73 (BP Location: Left Arm)   Pulse 80   Temp 98 F (36.7 C) (Oral)   Resp 18   Ht 4\' 11"  (1.499 m)   Wt 200 lb (90.7 kg)   LMP 02/03/2021 (Approximate)   SpO2 96%   BMI 40.40 kg/m   Visual Acuity Right Eye Distance:   Left Eye Distance:   Bilateral Distance:    Right Eye Near:   Left Eye Near:    Bilateral Near:     Physical Exam Vitals and nursing note reviewed.  Constitutional:      General: She is not in acute distress.    Appearance: Normal appearance. She is not ill-appearing or toxic-appearing.  HENT:      Head: Normocephalic and atraumatic.     Nose: Nose normal.     Mouth/Throat:     Mouth: Mucous membranes are moist.     Pharynx: Oropharynx is clear.  Eyes:     General: No scleral icterus.  Right eye: Hordeolum (small stye medial right upper eyelid. TTP) present. No discharge.        Left eye: No discharge.     Conjunctiva/sclera: Conjunctivae normal.     Comments: Mild swelling right upper eyelid. Yellowish drainage from medial corner of eye  Cardiovascular:     Rate and Rhythm: Normal rate.  Pulmonary:     Effort: Pulmonary effort is normal. No respiratory distress.  Musculoskeletal:     Cervical back: Neck supple.  Skin:    General: Skin is dry.  Neurological:     General: No focal deficit present.     Mental Status: She is alert. Mental status is at baseline.     Motor: No weakness.     Gait: Gait normal.  Psychiatric:        Mood and Affect: Mood normal.        Behavior: Behavior normal.        Thought Content: Thought content normal.     UC Treatments / Results  Labs (all labs ordered are listed, but only abnormal results are displayed) Labs Reviewed - No data to display  EKG   Radiology No results found.  Procedures Procedures (including critical care time)  Medications Ordered in UC Medications - No data to display  Initial Impression / Assessment and Plan / UC Course  I have reviewed the triage vital signs and the nursing notes.  Pertinent labs & imaging results that were available during my care of the patient were reviewed by me and considered in my medical decision making (see chart for details).  40 year old female presenting for redness, pain and swelling of the right upper eyelid since yesterday.  On exam she has a small stye.  Treating her at this time with erythromycin ophthalmic ointment.  Also reviewed importance of warm compresses and close monitoring.  Advised going to ED for any fever, significantly increased swelling of the eye or pain  moving the eye.  Reviewed return precautions.   Final Clinical Impressions(s) / UC Diagnoses   Final diagnoses:  Hordeolum externum of right upper eyelid     Discharge Instructions      -You have a stye.  Use warm compresses several times throughout the day to help with swelling. -Use ointment as prescribed. -If your swelling worsens or you have increased redness to the eye or pain, you should be seen again. -If you have fever, significantly worsening swelling of your eye, pain moving the eye, you should go to the emergency department. -This will get better but it may take a few days.     ED Prescriptions     Medication Sig Dispense Auth. Provider   erythromycin ophthalmic ointment Place a 1/2 inch ribbon of ointment into the lower eyelid q6h 3.5 g Shirlee Latch, PA-C      PDMP not reviewed this encounter.   Shirlee Latch, PA-C 03/08/21 1053

## 2021-03-08 NOTE — Discharge Instructions (Signed)
-  You have a stye.  Use warm compresses several times throughout the day to help with swelling. -Use ointment as prescribed. -If your swelling worsens or you have increased redness to the eye or pain, you should be seen again. -If you have fever, significantly worsening swelling of your eye, pain moving the eye, you should go to the emergency department. -This will get better but it may take a few days.

## 2021-03-08 NOTE — ED Triage Notes (Signed)
Pt c/o right eye irritation that started yesterday. Pt states she woke up today with increased eyelid swelling and matting. Pt reports the eyelid is tender to touch, denies itching or pain.

## 2021-03-12 ENCOUNTER — Other Ambulatory Visit: Payer: Self-pay

## 2021-03-12 ENCOUNTER — Emergency Department
Admission: EM | Admit: 2021-03-12 | Discharge: 2021-03-13 | Disposition: A | Discharge status: Home / Self Care | Payer: Medicare Other | Attending: Emergency Medicine | Admitting: Emergency Medicine

## 2021-03-12 DIAGNOSIS — Z7984 Long term (current) use of oral hypoglycemic drugs: Secondary | ICD-10-CM | POA: Diagnosis not present

## 2021-03-12 DIAGNOSIS — F17210 Nicotine dependence, cigarettes, uncomplicated: Secondary | ICD-10-CM | POA: Insufficient documentation

## 2021-03-12 DIAGNOSIS — K529 Noninfective gastroenteritis and colitis, unspecified: Secondary | ICD-10-CM

## 2021-03-12 DIAGNOSIS — D72819 Decreased white blood cell count, unspecified: Secondary | ICD-10-CM | POA: Diagnosis not present

## 2021-03-12 DIAGNOSIS — E119 Type 2 diabetes mellitus without complications: Secondary | ICD-10-CM | POA: Insufficient documentation

## 2021-03-12 DIAGNOSIS — R112 Nausea with vomiting, unspecified: Secondary | ICD-10-CM | POA: Diagnosis present

## 2021-03-12 LAB — COMPREHENSIVE METABOLIC PANEL
ALT: 31 U/L (ref 0–44)
AST: 26 U/L (ref 15–41)
Albumin: 4.4 g/dL (ref 3.5–5.0)
Alkaline Phosphatase: 67 U/L (ref 38–126)
Anion gap: 11 (ref 5–15)
BUN: 16 mg/dL (ref 6–20)
CO2: 22 mmol/L (ref 22–32)
Calcium: 9.4 mg/dL (ref 8.9–10.3)
Chloride: 102 mmol/L (ref 98–111)
Creatinine, Ser: 0.96 mg/dL (ref 0.44–1.00)
GFR, Estimated: >60 mL/min (ref >60)
Glucose, Bld: 197 mg/dL — ABNORMAL HIGH (ref 70–99)
Potassium: 3.1 mmol/L — ABNORMAL LOW (ref 3.5–5.1)
Sodium: 135 mmol/L (ref 135–145)
Total Bilirubin: 0.6 mg/dL (ref 0.3–1.2)
Total Protein: 8 g/dL (ref 6.5–8.1)

## 2021-03-12 LAB — CBC
HCT: 40.6 % (ref 36.0–46.0)
Hemoglobin: 13.9 g/dL (ref 12.0–15.0)
MCH: 29.4 pg (ref 26.0–34.0)
MCHC: 34.2 g/dL (ref 30.0–36.0)
MCV: 85.8 fL (ref 80.0–100.0)
Platelets: 330 10*3/uL (ref 150–400)
RBC: 4.73 MIL/uL (ref 3.87–5.11)
RDW: 14.4 % (ref 11.5–15.5)
WBC: 15.8 10*3/uL — ABNORMAL HIGH (ref 4.0–10.5)
nRBC: 0 % (ref 0.0–0.2)

## 2021-03-12 LAB — URINALYSIS, COMPLETE (UACMP) WITH MICROSCOPIC
Bilirubin Urine: NEGATIVE
Glucose, UA: NEGATIVE mg/dL
Ketones, ur: NEGATIVE mg/dL
Nitrite: NEGATIVE
Protein, ur: 30 mg/dL — AB
Specific Gravity, Urine: 1.017 (ref 1.005–1.030)
pH: 6 (ref 5.0–8.0)

## 2021-03-12 LAB — LIPASE, BLOOD: Lipase: 33 U/L (ref 11–51)

## 2021-03-12 MED ORDER — ONDANSETRON 4 MG PO TBDP
4.0000 mg | ORAL_TABLET | Freq: Once | ORAL | Status: AC | PRN
  Administered 2021-03-12: 4 mg via ORAL
  Filled 2021-03-12: qty 1

## 2021-03-12 MED ORDER — DICYCLOMINE HCL 10 MG/ML IM SOLN
20.0000 mg | Freq: Once | INTRAMUSCULAR | Status: AC
  Administered 2021-03-12: 20 mg via INTRAMUSCULAR
  Filled 2021-03-12: qty 2

## 2021-03-12 MED ORDER — DICYCLOMINE HCL 10 MG PO CAPS: 10.0000 mg | ORAL_CAPSULE | Freq: Four times a day (QID) | ORAL | 0 refills | Status: DC

## 2021-03-12 MED ORDER — ONDANSETRON 4 MG PO TBDP: 4.0000 mg | ORAL_TABLET | Freq: Three times a day (TID) | ORAL | 0 refills | Status: DC | PRN

## 2021-03-12 MED ORDER — SODIUM CHLORIDE 0.9 % IV SOLN
1000.0000 mL | Freq: Once | INTRAVENOUS | Status: AC
  Administered 2021-03-12: 1000 mL via INTRAVENOUS

## 2021-03-12 MED ORDER — SODIUM CHLORIDE 0.9 % IV SOLN
12.5000 mg | Freq: Four times a day (QID) | INTRAVENOUS | Status: DC | PRN
  Administered 2021-03-12: 12.5 mg via INTRAVENOUS
  Filled 2021-03-12: qty 12.5
  Filled 2021-03-12: qty 0.5

## 2021-03-12 NOTE — ED Notes (Signed)
NEGATIVE URINE PREGNANCY TEST RESULT

## 2021-03-12 NOTE — ED Provider Notes (Signed)
[facility] Emergency Department Provider Note   ____________________________________________    I have reviewed the triage vital signs and the nursing notes.   HISTORY  Chief Complaint Abdominal Pain     HPI Natalie Rose is a 40 y.o. female with history of anxiety, diabetes who presents with complaints of abdominal cramping, nausea vomiting and diarrhea.  Patient reports this is been ongoing for 2 to 3 days.  Review of record demonstrates the patient was seen at [facility] on the seventh of this month, she states that she felt slightly better afterwards but symptoms have  returned.  Nonbilious nonbloody vomitus.  No fevers.  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 Authorizing Provider  dicyclomine (BENTYL) 10 MG capsule Take 1 capsule (10 mg total) by mouth 4 (four) times daily for 14 days. 03/12/21 03/26/21 Yes Jene Every, MD  ondansetron (ZOFRAN ODT) 4 MG disintegrating tablet Take 1 tablet (4 mg total) by mouth every 8 (eight) hours as needed. 03/12/21  Yes Jene Every, MD  ferrous sulfate (FEROSUL) 325 (65 FE) MG tablet Take 325 mg by mouth daily with  breakfast.  04/29/20  [provider]  cholecalciferol (VITAMIN D3) 25 MCG (1000 UNIT) tablet Take 5,000 Units by mouth at bedtime.    [provider]  COMBIVENT RESPIMAT 20-100 MCG/ACT AERS respimat 1 puff 4 (four) times daily. 11/29/20   [provider]  erythromycin ophthalmic ointment Place a 1/2 inch ribbon of ointment into the lower eyelid q6h 03/08/21   Eusebio Friendly B, PA-C  famotidine (PEPCID) 20 MG tablet SMARTSIG:1 Tablet(s) By Mouth Every Evening 01/20/21   [provider]  hydrOXYzine (ATARAX/VISTARIL) 50 MG tablet Take 50 mg by mouth 3 (three) times daily as needed. 12/09/20   [provider]  LYBALVI 10-10 MG TABS Take 1 tablet by mouth daily. This is to replace Zyprexa 10 mg daily although she has not started taking yet 12/09/20   [provider]  metFORMIN (GLUCOPHAGE) 500 MG tablet Take 1 tablet (500 mg total) by mouth 2 (two) times daily with a meal. 12/17/20 03/08/21  Dahal, Melina Schools, MD  mirtazapine (REMERON) 15 MG tablet Take 15 mg by mouth at bedtime.    [provider]  naproxen (NAPROSYN) 500 MG tablet Take 500 mg by mouth daily as needed. 11/29/20   [provider]  oxyCODONE (OXY IR/ROXICODONE) 5 MG immediate release tablet Take by mouth. 02/03/21   [provider]  pantoprazole (PROTONIX) 40 MG tablet Take 1 tablet (40 mg total) by mouth daily. 04/29/20   Domenick Gong, MD  prazosin (MINIPRESS) 2 MG capsule Take 4 mg by mouth at bedtime.    [provider]  Sertraline HCl 150 MG CAPS Take 1 capsule by mouth daily. 12/09/20   [provider]  SUMAtriptan (IMITREX) 50 MG tablet Take 50 mg by mouth every 2 (two) hours as needed. 11/29/20   [provider]  traZODone (DESYREL) 100 MG tablet Take 100 mg by mouth at bedtime.    [provider]  eszopiclone (LUNESTA) 2 MG TABS tablet Take 2 mg by mouth at bedtime as needed for sleep. Take immediately before bedtime  04/29/20   [provider]  FLUoxetine (PROZAC) 40 MG capsule Take 40 mg by mouth daily.  10/08/19  [provider]  lurasidone (LATUDA) 40 MG TABS tablet Take 40 mg by mouth daily with breakfast.  10/08/19  [provider]     Allergies Diazepam and Lurasidone  Family History  Problem Relation Age of Onset   Osteopenia Mother    Heart failure Mother    Osteoarthritis Mother    Depression Mother    Diabetes Father     Social History Social History   Tobacco Use   Smoking status: Every Day    Packs/day: 1.00    Types: Cigarettes   Smokeless tobacco: Never  Vaping Use   Vaping Use: Never used  Substance Use Topics   Alcohol use: Not Currently   Drug use: Yes    Frequency: 3.0 times per week    Types: Marijuana    Comment: last use December 2021    Review of Systems  Constitutional: No fever/chills Eyes: No visual changes.  ENT: No sore throat. Cardiovascular: Denies chest pain. Respiratory: Denies shortness of breath. Gastrointestinal: As above Genitourinary: Negative for dysuria. Musculoskeletal: Negative for back pain. Skin: Negative for rash. Neurological: Negative for headaches or weakness   ____________________________________________   PHYSICAL EXAM:  VITAL SIGNS: ED Triage Vitals  Enc Vitals Group     BP 03/12/21 2000 140/71     Pulse Rate 03/12/21 2000 66     Resp 03/12/21 2000 18     Temp --      Temp src --      SpO2 03/12/21 1928 100 %     Weight --      Height --      Head Circumference --      Peak Flow --      Pain Score --      Pain Loc --      Pain Edu? --      Excl. in GC? --     Constitutional: Alert and oriented. No acute distress.   Nose: No congestion/rhinnorhea. Mouth/Throat: Mucous membranes are moist.   M Cardiovascular: Normal rate, regular rhythm. Grossly normal heart sounds.  Good peripheral circulation. Respiratory: Normal respiratory effort.  No retractions. Lungs CTAB. Gastrointestinal: Soft and  nontender. No distention.  No CVA tenderness.  Reassuring exam  Musculoskeletal: Warm and well perfused Neurologic:  Normal speech and language. No gross focal neurologic deficits are appreciated.  Skin:  Skin is warm, dry and intact. No rash noted. Psychiatric: Mood and affect are normal. Speech and behavior are normal.  ____________________________________________   LABS (all labs ordered are listed, but only abnormal results are displayed)  Labs Reviewed  COMPREHENSIVE METABOLIC PANEL - Abnormal; Notable for the following components:      Result Value   Potassium 3.1 (*)    Glucose, Bld 197 (*)    All other components within normal limits  CBC - Abnormal; Notable for the following components:   WBC  15.8 (*)    All other components within normal limits  URINALYSIS, COMPLETE (UACMP) WITH MICROSCOPIC - Abnormal; Notable for the following components:   Color, Urine YELLOW (*)    APPearance HAZY (*)    Hgb urine dipstick SMALL (*)    Protein, ur 30 (*)    Leukocytes,Ua MODERATE (*)    Bacteria, UA RARE (*)    All other components within normal limits  LIPASE, BLOOD  POC URINE PREG, ED   ____________________________________________  EKG  None ____________________________________________  RADIOLOGY  None, patient had CT scan 2 days ago, was read by radiology at [facility] is unremarkable ____________________________________________   PROCEDURES  Procedure(s) performed: No  Procedures   Critical Care performed: No ____________________________________________   INITIAL IMPRESSION / ASSESSMENT AND PLAN / ED COURSE  Pertinent labs & imaging results that were available during my care of the patient were reviewed by me and considered in my medical decision making (see chart for details).   Patient presents with nausea vomiting diarrhea abdominal cramping, suspicious for viral gastroenteritis.  Lab work is improved from labs performed at [facility] 2 days ago, white count has  decreased, BUN creatinine, electrolytes are unremarkable.  We will treat with IV fluids, IV Phenergan, will give Bentyl injection for abdominal cramping.    ----------------------------------------- 11:37 PM on 03/12/2021 ----------------------------------------- Patient is feeling improved, nausea has resolved.  Appropriate discharge at this time with close outpatient follow-up, return precautions    ____________________________________________   FINAL CLINICAL IMPRESSION(S) / ED DIAGNOSES  Final diagnoses:  Gastroenteritis        Note:  This document was prepared using Dragon voice recognition software and may include unintentional dictation errors.    Jene Every, MD 03/12/21 2337

## 2021-03-12 NOTE — ED Triage Notes (Signed)
Pt BIBA from home for c/o n/v abd pain since obtaining PNA & Flu vaccine a few days ago. Pt endorses 5 episodes of emesis today. Pt was seen at [facility] for same - she states " they didn't do anything for me". Pt endorses she was given prescription for zofran but did not fill it.

## 2021-03-12 NOTE — ED Notes (Signed)
First rn note: per ems pt with nausea for four days, worse since yesterday after receiving flu and PN immunization. Vitals: 163/95, 83, 95% ra, 97.9 temp, 79 fsbs. Per ems pt with generalized abd pain as well.

## 2021-05-29 ENCOUNTER — Ambulatory Visit
Admission: RE | Admit: 2021-05-29 | Discharge: 2021-05-29 | Disposition: A | Discharge status: Home / Self Care | Payer: Medicare Other | Source: Ambulatory Visit

## 2021-05-29 ENCOUNTER — Other Ambulatory Visit: Payer: Self-pay

## 2021-05-29 VITALS — BP 116/74 | HR 89 | Temp 98.0°F | Resp 16

## 2021-05-29 DIAGNOSIS — R1010 Upper abdominal pain, unspecified: Secondary | ICD-10-CM | POA: Diagnosis not present

## 2021-05-29 DIAGNOSIS — R197 Diarrhea, unspecified: Secondary | ICD-10-CM

## 2021-05-29 MED ORDER — DICYCLOMINE HCL 10 MG PO CAPS: 10.0000 mg | ORAL_CAPSULE | Freq: Two times a day (BID) | ORAL | 0 refills | Status: DC

## 2021-05-29 MED ORDER — LOPERAMIDE HCL 2 MG PO CAPS: 2.0000 mg | ORAL_CAPSULE | Freq: Four times a day (QID) | ORAL | 0 refills | Status: DC | PRN

## 2021-05-29 MED ORDER — ALUMINUM-MAGNESIUM-SIMETHICONE 200-200-20 MG/5ML PO SUSP: 30.0000 mL | Freq: Three times a day (TID) | ORAL | 0 refills | Status: DC

## 2021-05-29 NOTE — Discharge Instructions (Signed)
You can use Imodium twice a day once in the morning once in the evening and food to help with diarrhea, and be mindful over use of this medication may cause opposite effect constipation  Take Bentyl twice a day for the next 14 days or until your symptoms resolve, whichever comes first  You may use Maalox up to 4 times a day to help reduce gas  You can use over-the-counter ibuprofen or Tylenol, which ever you have at home, to help manage fevers  Continue to promote hydration throughout the day by using electrolyte replacement solution such as Gatorade, body armor, Pedialyte, which ever you have at home  Try eating bland foods such as bread, rice, toast, fruit which are easier on the stomach to digest, avoid foods that are overly spicy, overly seasoned or greasy   At any point if your abdominal pain becomes severe, your diarrhea persist past use of medication you begin to become dizzy or weak please go to the nearest emergency department for evaluation of your hydration

## 2021-05-29 NOTE — ED Triage Notes (Signed)
Patient presents to Urgent Care with complaints of intermittent epigastric abdominal pain x 2 weeks ago. Diarrhea x 3-4 days ago. Not taking meds to relieve pain. She describes the pain as "someone kicking me in my stomach."  Denies n/v or fever.

## 2021-05-29 NOTE — ED Provider Notes (Addendum)
[facility]    CSN: [id number] Arrival date & time: 05/29/21  1721      History   Chief Complaint Chief Complaint  Patient presents with   Abdominal Pain   Appointment    1700    HPI Natalie Rose is a 40 y.o. female.   Patient presents with intermittent epigastric abdominal pain described as "feeling of being kicked" for 2 weeks.  Endorses that diarrhea began 4 days ago, describes as a mix between watery and soft.  Last episode of diarrhea this morning.  Associated increased gas production has not attempted treatment of symptoms.  Tolerating food and liquids.  Denies fever, chills, nausea, vomiting, heartburn or indigestion, bloating, blood in stool or mucus in stool, dietary changes, recent travel.  No known sick contacts.  History of diabetes mellitus, migraines, arthritis, anxiety, depression.  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      Home Medications    Prior to Admission medications   Medication Sig Start Date End Date Taking? Authorizing Provider  SUMAtriptan (IMITREX) 50 MG tablet Take by mouth. 11/29/20 11/30/21 Yes [provider]  ferrous sulfate (FEROSUL) 325 (65 FE) MG tablet  Take 325 mg by mouth daily with breakfast.  04/29/20  [provider]  cholecalciferol (VITAMIN D3) 25 MCG (1000 UNIT) tablet Take 5,000 Units by mouth at bedtime.    [provider]  COMBIVENT RESPIMAT 20-100 MCG/ACT AERS respimat 1 puff 4 (four) times daily. 11/29/20   [provider]  dicyclomine (BENTYL) 10 MG capsule Take 1 capsule (10 mg total) by mouth 4 (four) times daily for 14 days. 03/12/21 03/26/21  Jene Every, MD  erythromycin ophthalmic ointment Place a 1/2 inch ribbon of ointment into the lower eyelid q6h 03/08/21   Eusebio Friendly B, PA-C  famotidine (PEPCID) 20 MG tablet SMARTSIG:1 Tablet(s) By Mouth Every Evening 01/20/21   [provider]  hydrOXYzine (ATARAX/VISTARIL) 50 MG tablet Take 50 mg by mouth 3 (three) times daily as needed. 12/09/20   [provider]  LYBALVI 10-10 MG TABS Take 1 tablet by mouth daily. This is to replace Zyprexa 10 mg daily although she has not started taking yet 12/09/20   [provider]  metFORMIN (GLUCOPHAGE) 500 MG tablet Take 1 tablet (500 mg total) by mouth 2 (two) times daily with a meal. 12/17/20 03/08/21  Dahal, Melina Schools, MD  mirtazapine (REMERON) 15 MG tablet Take 15 mg by mouth at bedtime.    [provider]  naproxen (NAPROSYN) 500 MG tablet Take 500 mg by mouth daily as needed. 11/29/20   [provider]  ondansetron (ZOFRAN ODT) 4 MG disintegrating tablet Take 1 tablet (4 mg total) by mouth every  8 (eight) hours as needed. 03/12/21   Lavonia Drafts, MD  oxyCODONE (OXY IR/ROXICODONE) 5 MG immediate release tablet Take by mouth. 02/03/21   [provider]  pantoprazole (PROTONIX) 40 MG tablet Take 1 tablet (40 mg total) by mouth daily. 04/29/20   Melynda Ripple, MD  prazosin (MINIPRESS) 2 MG capsule Take 4 mg by mouth at bedtime.    [provider]  Sertraline HCl 150 MG CAPS Take 1 capsule by mouth daily. 12/09/20   [provider]  SUMAtriptan (IMITREX) 50  MG tablet Take 50 mg by mouth every 2 (two) hours as needed. 11/29/20   [provider]  traZODone (DESYREL) 100 MG tablet Take 100 mg by mouth at bedtime.    [provider]  eszopiclone (LUNESTA) 2 MG TABS tablet Take 2 mg by mouth at bedtime as needed for sleep. Take immediately before bedtime  04/29/20  [provider]  FLUoxetine (PROZAC) 40 MG capsule Take 40 mg by mouth daily.  10/08/19  [provider]  lurasidone (LATUDA) 40 MG TABS tablet Take 40 mg by mouth daily with breakfast.  10/08/19  [provider]    Family History Family History  Problem Relation Age of Onset   Osteopenia Mother    Heart failure Mother    Osteoarthritis Mother    Depression Mother    Diabetes Father     Social History Social History   Tobacco Use   Smoking status: Every Day    Packs/day: 1.00    Types: Cigarettes   Smokeless tobacco: Never  Vaping Use   Vaping Use: Never used  Substance Use Topics   Alcohol use: Not Currently   Drug use: Yes    Frequency: 3.0 times per week    Types: Marijuana    Comment: last use December 2021     Allergies   Diazepam and Lurasidone   Review of Systems Review of Systems  Constitutional: Negative.   Respiratory: Negative.    Cardiovascular: Negative.   Gastrointestinal:  Positive for abdominal pain and diarrhea. Negative for abdominal distention, anal bleeding, blood in stool, constipation, nausea, rectal pain and vomiting.  Skin: Negative.   Neurological: Negative.     Physical Exam Triage Vital Signs ED Triage Vitals  Enc Vitals Group     BP 05/29/21 1737 116/74     Pulse Rate 05/29/21 1737 89     Resp 05/29/21 1737 16     Temp 05/29/21 1737 98 F (36.7 C)     Temp Source 05/29/21 1737 Oral     SpO2 05/29/21 1737 98 %     Weight --      Height --      Head Circumference --      Peak Flow --      Pain Score 05/29/21 1736 2     Pain Loc --      Pain Edu? --      Excl. in [place]? --    No data  found.  Updated Vital Signs BP 116/74 (BP Location: Right Arm)    Pulse 89    Temp 98 F (36.7 C) (Oral)    Resp 16    LMP 05/17/2021    SpO2 98%   Visual Acuity Right Eye Distance:   Left Eye Distance:   Bilateral Distance:    Right Eye Near:   Left Eye Near:    Bilateral Near:     Physical Exam Constitutional:      Appearance: She  is well-developed.  HENT:     Head: Normocephalic.  Eyes:     Extraocular Movements: Extraocular movements intact.  Pulmonary:     Effort: Pulmonary effort is normal.  Abdominal:     General: Abdomen is flat. Bowel sounds are normal.     Palpations: Abdomen is soft.     Tenderness: There is abdominal tenderness in the right upper quadrant, epigastric area and left upper quadrant. There is no guarding.  Skin:    General: Skin is warm and dry.  Neurological:     General: No focal deficit present.     Mental Status: She is alert and oriented to person, place, and time.  Psychiatric:        Mood and Affect: Mood normal.        Behavior: Behavior normal.     UC Treatments / Results  Labs (all labs ordered are listed, but only abnormal results are displayed) Labs Reviewed - No data to display  EKG   Radiology No results found.  Procedures Procedures (including critical care time)  Medications Ordered in UC Medications - No data to display  Initial Impression / Assessment and Plan / UC Course  I have reviewed the triage vital signs and the nursing notes.  Pertinent labs & imaging results that were available during my care of the patient were reviewed by me and considered in my medical decision making (see chart for details).  Pain in the upper abdomen Diarrhea  Vital signs are stable, patient is in no signs of distress, diffuse tenderness present throughout the bilateral and mid upper abdomen, low suspicion for infectious cause due to timeline of symptoms, etiology possibly inflammatory, will manage conservatively at this time,  prescribed Bentyl for 14-day course, Imodium and Maalox to be used as needed, given strict precautions to go to the nearest emergency department for worsening symptoms for persistent or reoccurring symptoms patient may follow-up with GI specialist, advised increase fluid intake to prevent dehydration, patient in agreement with plan of care Final Clinical Impressions(s) / UC Diagnoses   Final diagnoses:  None   Discharge Instructions   None    ED Prescriptions   None    PDMP not reviewed this encounter.   Hans Eden, NP 05/29/21 1859    Hans Eden, NP 05/29/21 1900

## 2021-06-09 ENCOUNTER — Other Ambulatory Visit: Payer: Self-pay

## 2021-06-09 ENCOUNTER — Ambulatory Visit
Admission: RE | Admit: 2021-06-09 | Discharge: 2021-06-09 | Disposition: A | Discharge status: Home / Self Care | Payer: Commercial Managed Care - HMO | Source: Ambulatory Visit

## 2021-06-09 VITALS — BP 108/79 | HR 78 | Temp 98.2°F | Resp 14 | Ht 59.0 in | Wt 200.0 lb

## 2021-06-09 DIAGNOSIS — R1084 Generalized abdominal pain: Secondary | ICD-10-CM

## 2021-06-09 DIAGNOSIS — R112 Nausea with vomiting, unspecified: Secondary | ICD-10-CM

## 2021-06-09 DIAGNOSIS — R197 Diarrhea, unspecified: Secondary | ICD-10-CM

## 2021-06-09 NOTE — ED Triage Notes (Signed)
Patient states that she was seen at [facility] ED for stomach pain, nausea, and vomiting.  Patient states the symptoms started 5 days ago.  Patient denies fevers.  Patient states that she was given Zofran in the ED.

## 2021-06-09 NOTE — Discharge Instructions (Addendum)
Please go directly to the emergency room 

## 2021-06-09 NOTE — ED Provider Notes (Signed)
[facility]    CSN: [id number] Arrival date & time: 06/09/21  1456      History   Chief Complaint Chief Complaint  Patient presents with   Appointment   Emesis   Nausea    HPI Natalie Rose is a 41 y.o. female.   HPI  Nausea and Vomiting: Patient reports that for the past 5 days she has had nausea and vomiting along with abdominal pain.  She was seen at [facility] ED for symptoms yesterday.  There she had lab work completed which showed an elevated white blood cell count of 19.7, glucose 185, protein 8.4, normal lipase of 33, negative quantitative hCG and a UA showing trace leukocytes, protein, ketones, RBC, WBC. She reports that she was given Zofran which has only helped minimally.  She reports that she was told that her symptoms were due to her "smoking weed" and she was discharged home.  She states that since this time her nausea and vomiting have continued and her diarrhea has worsened.  Both vomiting and diarrhea are nonbloody in nature.  No fevers.  No dysuria, urinary frequency.  She reports that she has stopped using all cannabis and has not noticed any improvement in symptoms.  Past Medical History:  Diagnosis Date   Anxiety    Arthritis    Depression    Diabetes mellitus without complication ([place])    Mental health disorder    Migraines     Patient Active Problem List   Diagnosis Date Noted   Diplopia [id number]   Self-inflicted laceration of wrist ([place]) 06/17/2018   Cannabis abuse 06/17/2018   At risk for intentional self-harm 06/16/2018   Verbalizes suicidal thoughts 06/16/2018   MDD (major depressive disorder), recurrent severe, without psychosis ([place]) 06/16/2018   Borderline personality disorder ([place]) 09/20/2012   PTSD (post-traumatic stress disorder) 09/20/2012   Major depressive disorder, single episode, unspecified 09/20/2012    Past Surgical History:  Procedure Laterality Date   MOUTH SURGERY      OB History   No  obstetric history on file.      Home Medications    Prior to Admission medications   Medication Sig Start Date End Date Taking? Authorizing Provider  cholecalciferol (VITAMIN D3) 25 MCG (1000 UNIT) tablet Take 5,000 Units by mouth at bedtime.   Yes [provider]  hydrOXYzine (ATARAX/VISTARIL) 50 MG tablet Take 50 mg by mouth 3 (three) times daily as needed. 12/09/20  Yes [provider]  LYBALVI 10-10 MG TABS Take 1 tablet by mouth daily. This is to replace Zyprexa 10 mg daily although she has not started taking yet 12/09/20  Yes [provider]  metFORMIN (GLUCOPHAGE) 500 MG tablet Take 1 tablet (500 mg total) by mouth 2 (two) times daily with a meal. 12/17/20 06/09/21 Yes Dahal, Marlowe Aschoff, MD  mirtazapine (REMERON) 15 MG tablet Take 15 mg by mouth at bedtime.   Yes [provider]  prazosin (MINIPRESS) 2 MG capsule Take 4 mg by mouth at bedtime.   Yes [provider]  Sertraline HCl 150 MG CAPS Take 1 capsule by mouth daily. 12/09/20  Yes [provider]  SUMAtriptan (IMITREX) 50 MG tablet Take 50 mg by mouth every 2 (two) hours as needed. 11/29/20  Yes [provider]  traZODone (DESYREL) 100 MG tablet Take 100 mg by mouth at bedtime.   Yes [provider]  ferrous sulfate (FEROSUL) 325 (65 FE) MG tablet Take 325 mg by mouth daily with breakfast.  04/29/20  [provider]  aluminum-magnesium hydroxide-simethicone (MAALOX) [id number] MG/5ML SUSP Take 30 mLs by mouth 4 (four) times daily -  before meals and at bedtime. 05/29/21   White, Adrienne R, NP  COMBIVENT RESPIMAT 20-100 MCG/ACT AERS respimat 1 puff 4 (four) times daily. 11/29/20   [provider]  dicyclomine (BENTYL) 10 MG capsule Take 1 capsule (10 mg total) by mouth in the morning and at bedtime for 14 days. 05/29/21 06/12/21  Hans Eden, NP  erythromycin ophthalmic ointment Place a 1/2 inch ribbon of ointment into the lower eyelid q6h 03/08/21    Laurene Footman B, PA-C  famotidine (PEPCID) 20 MG tablet SMARTSIG:1 Tablet(s) By Mouth Every Evening 01/20/21   [provider]  loperamide (IMODIUM) 2 MG capsule Take 1 capsule (2 mg total) by mouth 4 (four) times daily as needed for diarrhea or loose stools. 05/29/21   White, Leitha Schuller, NP  naproxen (NAPROSYN) 500 MG tablet Take 500 mg by mouth daily as needed. 11/29/20   [provider]  ondansetron (ZOFRAN ODT) 4 MG disintegrating tablet Take 1 tablet (4 mg total) by mouth every 8 (eight) hours as needed. 03/12/21   Lavonia Drafts, MD  oxyCODONE (OXY IR/ROXICODONE) 5 MG immediate release tablet Take by mouth. 02/03/21   [provider]  pantoprazole (PROTONIX) 40 MG tablet Take 1 tablet (40 mg total) by mouth daily. 04/29/20   Melynda Ripple, MD  SUMAtriptan (IMITREX) 50 MG tablet Take by mouth. 11/29/20 11/30/21  [provider]  eszopiclone (LUNESTA) 2 MG TABS tablet Take 2 mg by mouth at bedtime as needed for sleep. Take immediately before bedtime  04/29/20  [provider]  FLUoxetine (PROZAC) 40 MG capsule Take 40 mg by mouth daily.  10/08/19  [provider]  lurasidone (LATUDA) 40 MG TABS tablet Take 40 mg by mouth daily with breakfast.  10/08/19  [provider]    Family History Family History  Problem Relation Age of Onset   Osteopenia Mother    Heart failure Mother    Osteoarthritis Mother    Depression Mother    Diabetes Father     Social History Social History   Tobacco Use   Smoking status: Every Day    Packs/day: 1.00    Types: Cigarettes   Smokeless tobacco: Never  Vaping Use   Vaping Use: Never used  Substance Use Topics   Alcohol use: Not Currently   Drug use: Yes    Frequency: 3.0 times per week    Types: Marijuana    Comment: last use December 2021     Allergies   Diazepam and Lurasidone   Review of Systems Review of Systems  As stated above in HPI Physical Exam Triage Vital Signs ED  Triage Vitals  Enc Vitals Group     BP 06/09/21 1509 108/79     Pulse Rate 06/09/21 1509 78     Resp 06/09/21 1509 14     Temp 06/09/21 1509 98.2 F (36.8 C)     Temp Source 06/09/21 1509 Oral     SpO2 06/09/21 1509 97 %     Weight 06/09/21 1506 199 lb 15.3 oz (90.7 kg)     Height 06/09/21 1506 4\' 11"  (1.499 m)     Head Circumference --      Peak Flow --      Pain Score 06/09/21 1506 6     Pain Loc --      Pain Edu? --  Excl. in [place]? --    No data found.  Updated Vital Signs BP 108/79 (BP Location: Left Arm)    Pulse 78    Temp 98.2 F (36.8 C) (Oral)    Resp 14    Ht 4\' 11"  (1.499 m)    Wt 199 lb 15.3 oz (90.7 kg)    LMP 05/17/2021 (Approximate)    SpO2 97%    BMI 40.39 kg/m   Physical Exam Vitals and nursing note reviewed.  Constitutional:      General: She is not in acute distress.    Appearance: Normal appearance. She is ill-appearing. She is not toxic-appearing or diaphoretic.  HENT:     Head: Normocephalic and atraumatic.     Mouth/Throat:     Comments: Mildly dry Eyes:     General: No scleral icterus. Cardiovascular:     Rate and Rhythm: Normal rate and regular rhythm.     Heart sounds: Normal heart sounds.  Pulmonary:     Effort: Pulmonary effort is normal.     Breath sounds: Normal breath sounds.  Abdominal:     General: There is distension (mild).     Palpations: There is no mass.     Tenderness: There is abdominal tenderness (generalized). There is guarding. There is no right CVA tenderness, left CVA tenderness or rebound.     Hernia: No hernia is present.     Comments: Decreased bowel sounds  Musculoskeletal:     Cervical back: Normal range of motion and neck supple.  Lymphadenopathy:     Cervical: No cervical adenopathy.  Skin:    General: Skin is warm.     Coloration: Skin is not jaundiced.  Neurological:     Mental Status: She is alert and oriented to person, place, and time.     UC Treatments / Results  Labs (all labs ordered are  listed, but only abnormal results are displayed) Labs Reviewed - No data to display  EKG   Radiology No results found.  Procedures Procedures (including critical care time)  Medications Ordered in UC Medications - No data to display  Initial Impression / Assessment and Plan / UC Course  I have reviewed the triage vital signs and the nursing notes.  Pertinent labs & imaging results that were available during my care of the patient were reviewed by me and considered in my medical decision making (see chart for details).     New.  Symptoms concerning for surgical abdomen. Differential includes obstruction vs other. It is possible that she could have some gastroparesis secondary to cannabis use however her case does not present in the similar fashion given her significantly elevated white blood cell count, abdominal tenderness, and intractable symptoms despite cessation of cannabis.  Review of her chart further shows a normal white blood cell count 8 days prior -when she had still been using cannabis a few times per day so any elevation of white blood cell count attributed solely to cannabis use would be incorrect in this case in my opinion.  Patient needs repeat lab work and CT scan imaging which I do not have access to here.  Patient is agreeable and has transportation to the emergency room.  She will be n.p.o. Final Clinical Impressions(s) / UC Diagnoses   Final diagnoses:  None   Discharge Instructions   None    ED Prescriptions   None    PDMP not reviewed this encounter.   Hughie Closs, [place] 06/09/21 1551

## 2021-09-06 ENCOUNTER — Ambulatory Visit
Admission: RE | Admit: 2021-09-06 | Discharge: 2021-09-06 | Disposition: A | Discharge status: Home / Self Care | Payer: Medicare Other | Source: Ambulatory Visit | Attending: Emergency Medicine | Admitting: Emergency Medicine

## 2021-09-06 VITALS — BP 107/82 | HR 78 | Temp 97.6°F | Resp 16

## 2021-09-06 DIAGNOSIS — B349 Viral infection, unspecified: Secondary | ICD-10-CM | POA: Insufficient documentation

## 2021-09-06 DIAGNOSIS — F17210 Nicotine dependence, cigarettes, uncomplicated: Secondary | ICD-10-CM | POA: Diagnosis not present

## 2021-09-06 DIAGNOSIS — U071 COVID-19: Secondary | ICD-10-CM | POA: Insufficient documentation

## 2021-09-06 NOTE — ED Triage Notes (Signed)
Patient presents to Urgent Care with complaints of bilateral ear fullness and congestion since 4 days ago. Not taking any OTC meds.  ? ?Denies fever.  ?

## 2021-09-06 NOTE — ED Provider Notes (Signed)
[facility]    CSN: [id number] Arrival date & time: 09/06/21  1342      History   Chief Complaint Chief Complaint  Patient presents with   Ear Fullness    And congestion - Entered by patient    HPI Natalie Rose is a 41 y.o. female.   Patient presents with chills, nasal congestion, bilateral ear pain, right worse than left, nonproductive cough and intermittent shortness of breath with exertion for 4 days.  Tolerating food and liquids.  No known sick contacts.  History of COPD.  Requesting COVID testing.  Not attempted treatment of symptoms.  Daily smoker.  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      Home Medications    Prior to Admission medications   Medication Sig Start Date End Date Taking? Authorizing Provider  ferrous sulfate (FEROSUL) 325 (65 FE) MG tablet Take 325 mg by mouth daily with breakfast.  04/29/20  [provider]  aluminum-magnesium hydroxide-simethicone (MAALOX) 200-200-20 MG/5ML SUSP Take 30 mLs by mouth 4 (four) times daily -  before meals and at bedtime. 05/29/21   Valinda Hoar, NP  cholecalciferol (VITAMIN D3) 25 MCG (1000 UNIT) tablet Take 5,000 Units by mouth at  bedtime.    [provider]  COMBIVENT RESPIMAT 20-100 MCG/ACT AERS respimat 1 puff 4 (four) times daily. 11/29/20   [provider]  dicyclomine (BENTYL) 10 MG capsule Take 1 capsule (10 mg total) by mouth in the morning and at bedtime for 14 days. 05/29/21 06/12/21  Valinda Hoar, NP  erythromycin ophthalmic ointment Place a 1/2 inch ribbon of ointment into the lower eyelid q6h 03/08/21   Eusebio Friendly B, PA-C  famotidine (PEPCID) 20 MG tablet SMARTSIG:1 Tablet(s) By Mouth Every Evening 01/20/21   [provider]  hydrOXYzine (ATARAX/VISTARIL) 50 MG tablet Take 50 mg by mouth 3 (three) times daily as needed. 12/09/20   [provider]  loperamide (IMODIUM) 2 MG capsule Take 1 capsule (2 mg total) by mouth 4 (four) times daily as needed for diarrhea or loose stools. 05/29/21   Loucille Takach R, NP  LYBALVI 10-10 MG TABS Take 1 tablet by mouth daily. This is to replace Zyprexa 10 mg daily although she has not started taking yet 12/09/20   [provider]  metFORMIN (GLUCOPHAGE) 500 MG tablet Take 1 tablet (500 mg total) by mouth 2 (two) times daily with a meal. 12/17/20 06/09/21  Dahal, Melina Schools, MD  mirtazapine (REMERON) 15 MG tablet Take 15 mg by mouth at bedtime.    [provider]  naproxen (NAPROSYN) 500 MG tablet Take 500 mg by mouth daily as needed. 11/29/20   [provider]  ondansetron [facility]  ODT) 4 MG disintegrating tablet Take 1 tablet (4 mg total) by mouth every 8 (eight) hours as needed. 03/12/21   Jene Every, MD  oxyCODONE (OXY IR/ROXICODONE) 5 MG immediate release tablet Take by mouth. 02/03/21   [provider]  pantoprazole (PROTONIX) 40 MG tablet Take 1 tablet (40 mg total) by mouth daily. 04/29/20   Domenick Gong, MD  prazosin (MINIPRESS) 2 MG capsule Take 4 mg by mouth at bedtime.    [provider]  Sertraline HCl 150 MG CAPS Take 1 capsule by mouth daily. 12/09/20   [provider]  SUMAtriptan  (IMITREX) 50 MG tablet Take 50 mg by mouth every 2 (two) hours as needed. 11/29/20   [provider]  SUMAtriptan (IMITREX) 50 MG tablet Take by mouth. 11/29/20 11/30/21  [provider]  traZODone (DESYREL) 100 MG tablet Take 100 mg by mouth at bedtime.    [provider]  eszopiclone (LUNESTA) 2 MG TABS tablet Take 2 mg by mouth at bedtime as needed for sleep. Take immediately before bedtime  04/29/20  [provider]  FLUoxetine (PROZAC) 40 MG capsule Take 40 mg by mouth daily.  10/08/19  [provider]  lurasidone (LATUDA) 40 MG TABS tablet Take 40 mg by mouth daily with breakfast.  10/08/19  [provider]    Family History Family History  Problem Relation Age of Onset   Osteopenia Mother    Heart failure Mother    Osteoarthritis Mother    Depression Mother    Diabetes Father     Social History Social History   Tobacco Use   Smoking status: Every Day    Packs/day: 1.00    Types: Cigarettes   Smokeless tobacco: Never  Vaping Use   Vaping Use: Never used  Substance Use Topics   Alcohol use: Not Currently   Drug use: Yes    Frequency: 3.0 times per week    Types: Marijuana    Comment: last use December 2021     Allergies   Diazepam and Lurasidone   Review of Systems Review of Systems  Constitutional:  Positive for chills. Negative for activity change, appetite change, diaphoresis, fatigue, fever and unexpected weight change.  HENT:  Positive for congestion and ear pain. Negative for dental problem, drooling, ear discharge, facial swelling, hearing loss, mouth sores, nosebleeds, postnasal drip, rhinorrhea, sinus pressure, sinus pain, sneezing, sore throat, tinnitus, trouble swallowing and voice change.   Respiratory:  Positive for cough and shortness of breath. Negative for apnea, choking, chest tightness, wheezing and stridor.   Cardiovascular: Negative.   Gastrointestinal: Negative.     Physical Exam Triage Vital  Signs ED Triage Vitals  Enc Vitals Group     BP 09/06/21 1356 107/82     Pulse Rate 09/06/21 1356 78     Resp 09/06/21 1356 16     Temp 09/06/21 1356 97.6 F (36.4 C)     Temp Source 09/06/21 1356 Oral     SpO2 09/06/21 1356 97 %     Weight --      Height --      Head Circumference --      Peak Flow --      Pain Score 09/06/21 1352 3     Pain Loc --      Pain Edu? --      Excl. in GC? --    No data found.  Updated Vital Signs BP 107/82 (BP Location: Left Arm)   Pulse 78  Temp 97.6 F (36.4 C) (Oral)   Resp 16   LMP 08/16/2021   SpO2 97%   Visual Acuity Right Eye Distance:   Left Eye Distance:   Bilateral Distance:    Right Eye Near:   Left Eye Near:    Bilateral Near:     Physical Exam Constitutional:      Appearance: Normal appearance.  HENT:     Head: Normocephalic.     Right Ear: Tympanic membrane, ear canal and external ear normal.     Left Ear: Tympanic membrane, ear canal and external ear normal.     Nose: Nose normal.     Mouth/Throat:     Mouth: Mucous membranes are moist.     Pharynx: Oropharynx is clear.  Eyes:     Extraocular Movements: Extraocular movements intact.  Cardiovascular:     Rate and Rhythm: Normal rate and regular rhythm.     Pulses: Normal pulses.     Heart sounds: Normal heart sounds.  Pulmonary:     Effort: Pulmonary effort is normal.     Breath sounds: Normal breath sounds.  Musculoskeletal:     Cervical back: Normal range of motion.  Skin:    General: Skin is warm and dry.  Neurological:     Mental Status: She is alert and oriented to person, place, and time. Mental status is at baseline.  Psychiatric:        Mood and Affect: Mood normal.        Behavior: Behavior normal.     UC Treatments / Results  Labs (all labs ordered are listed, but only abnormal results are displayed) Labs Reviewed - No data to display  EKG   Radiology No results found.  Procedures Procedures (including critical care  time)  Medications Ordered in UC Medications - No data to display  Initial Impression / Assessment and Plan / UC Course  I have reviewed the triage vital signs and the nursing notes.  Pertinent labs & imaging results that were available during my care of the patient were reviewed by me and considered in my medical decision making (see chart for details).  Viral illness  Etiology of symptoms most likely viral flaring COPD which is resulting in shortness of breath, discussed with patient, COVID test pending, recommended Flonase, Mucinex and antihistamine for management of congestion, offered steroid injection in office for shortness of breath, declined, endorses that symptoms have already begun to improve, recommended over-the-counter medications for additional support, given strict precautions for worsening signs of breathing to follow-up with urgent care or primary doctor as needed Final Clinical Impressions(s) / UC Diagnoses   Final diagnoses:  None   Discharge Instructions   None    ED Prescriptions   None    PDMP not reviewed this encounter.   Valinda Hoar, NP 09/06/21 1422

## 2021-09-06 NOTE — Discharge Instructions (Signed)
Your symptoms on day of rest likely related to a virus meaning it should resolve on its own, viral symptoms may sometimes fully your COPD which is why you are experiencing some shortness of breath ? ?Your COVID test is pending you will be called only if positive, if positive the CDC recommends a 5-day quarantine from the onset of your symptoms you may resume normal activity on Saturday, 09/09/2021 ? ?Your ear pain is being caused by your congestion and there were no signs of infection on exam ? ?Begin use of Flonase every morning which will help clear out your sinuses which will alleviate your pain ? ?Begin use of Mucinex, this medicine will help to thin out secretions and allow them to drain from your sinus cavity ? ?Please return to urgent care for worsening signs of breathing for further evaluation and management ?

## 2021-09-07 LAB — SARS CORONAVIRUS 2 (TAT 6-24 HRS): SARS Coronavirus 2: POSITIVE — AB

## 2021-11-02 IMAGING — MR MR HEAD W/O CM
15 of 16 series · 46 of 48 positions shown · IV contrast (gadavist)
Comparison: None.

CLINICAL DATA: Initial evaluation for bilateral papilledema,
diplopia.

EXAM:
MRI HEAD WITHOUT CONTRAST
MRV HEAD WITHOUT AND WITH CONTRAST
TECHNIQUE: Multiplanar, multiecho pulse sequences of the brain and surrounding
structures were obtained without and with intravenous contrast.
Angiographic images of the intracranial venous structures were
obtained using MRV technique without intravenous contrast.
CONTRAST:  9mL GADAVIST GADOBUTROL 1 MMOL/ML IV SOLN

[Series 5: ax dwi_tracew · axial · 3.0mm · 0.65mm/px · z∈[-128,+26]mm · 4 of 48 slices shown]
[im 1/48]
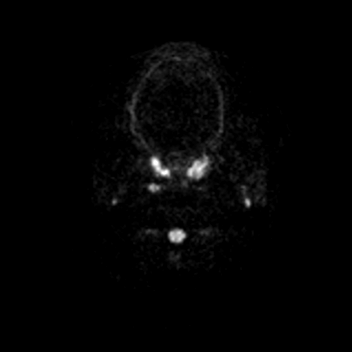
[im 16/48]
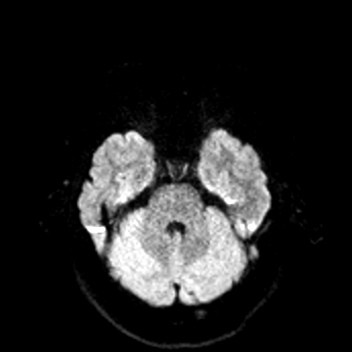
[im 32/48]
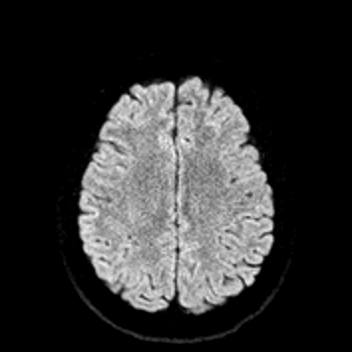
[im 48/48]
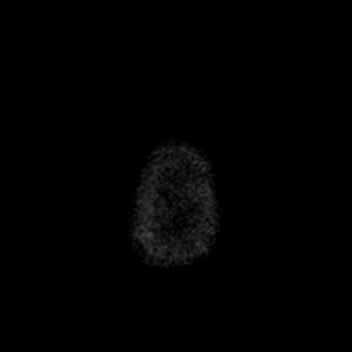

[Series 6: ax dwi_adc · axial · 3.0mm · 0.65mm/px · z∈[-128,+26]mm · 3 of 48 slices shown]
[im 1/48]
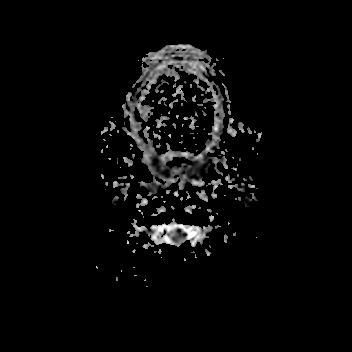
[im 24/48]
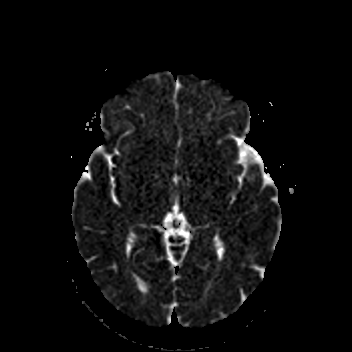
[im 48/48]
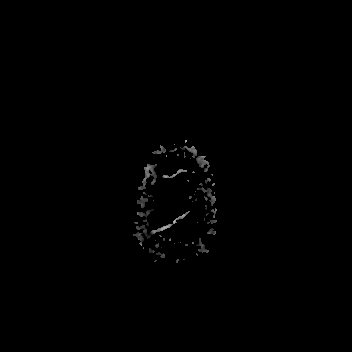

[Series 7: cor dwi_tracew · coronal · 5.0mm · 0.65mm/px · 2 of 40 slices shown]
[im 1/40]
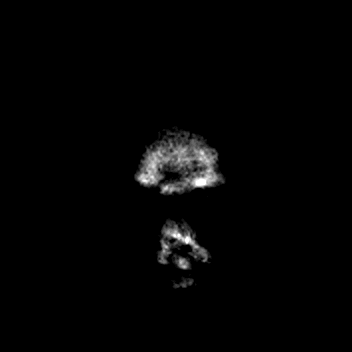
[im 40/40]
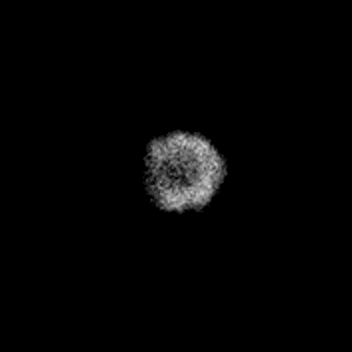

[Series 8: cor dwi_adc · coronal · 5.0mm · 0.65mm/px · 2 of 38 slices shown]
[im 1/38]
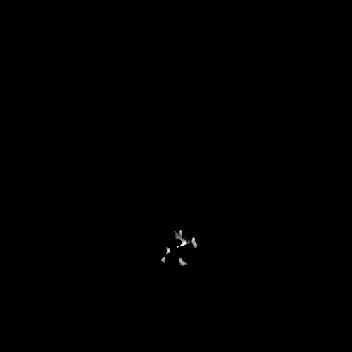
[im 38/38]
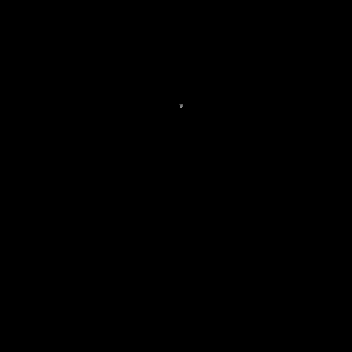

[Series 9: T1 · sagittal · 5.0mm · 0.62mm/px · 1 of 25 slices shown (1 of 2)]
[im 1/25]
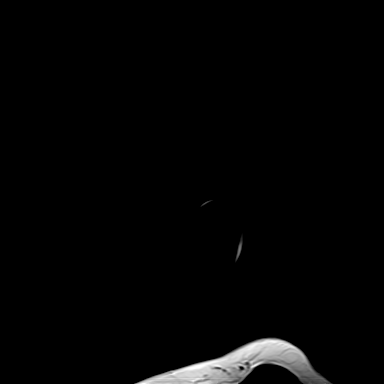

[Series 10: T2 · axial · 5.0mm · 0.53mm/px · 1 of 27 slices shown (1 of 2)]
[im 1/27]
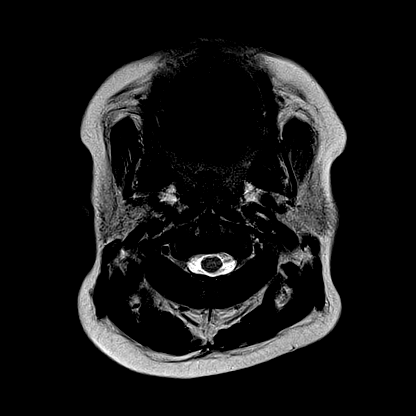

[Series 11: mag_images · axial · 3.0mm · 0.90mm/px · z∈[-137,+39]mm · 3 of 60 slices shown (1 of 2)]
[im 1/60]
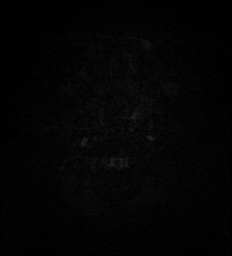
[im 30/60]
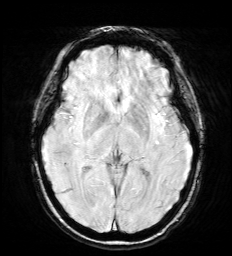
[im 60/60]
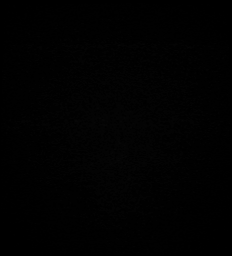

[Series 12: pha_images · axial · 3.0mm · 0.90mm/px · z∈[-134,+30]mm · 3 of 55 slices shown (1 of 2)]
[im 1/55]
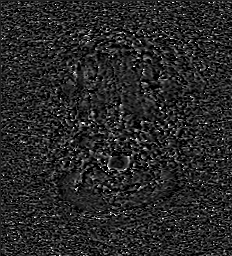
[im 28/55]
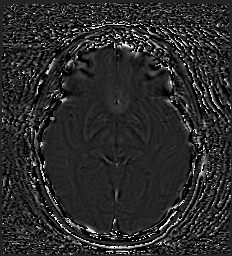
[im 55/55]
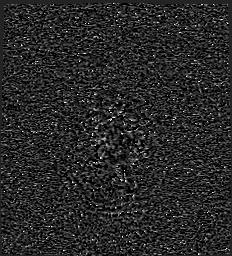

[Series 13: swi_images · axial · 3.0mm · 0.90mm/px · z∈[-137,+39]mm · 3 of 60 slices shown (1 of 2)]
[im 1/60]
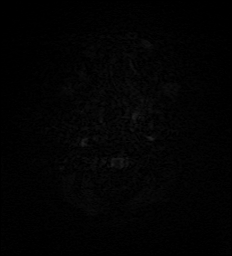
[im 30/60]
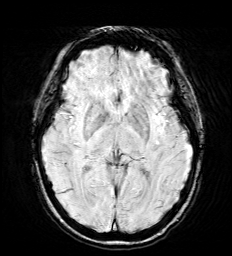
[im 60/60]
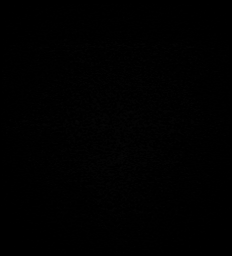

[Series 15: FLAIR · axial · 3.0mm · 0.53mm/px · z∈[-129,+32]mm · 3 of 55 slices shown]
[im 1/55]
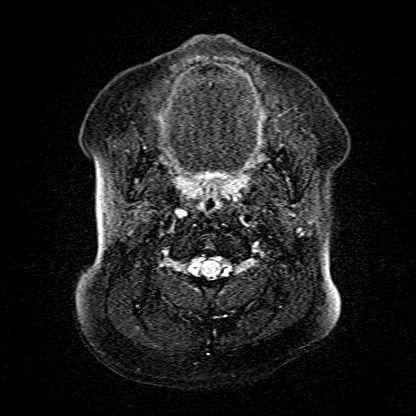
[im 28/55]
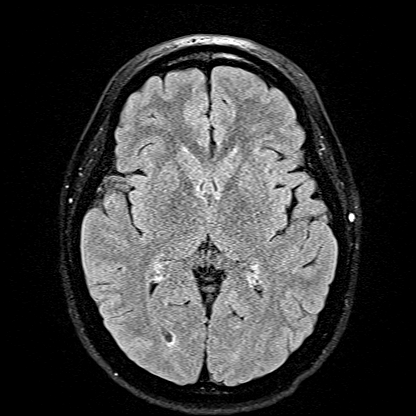
[im 55/55]
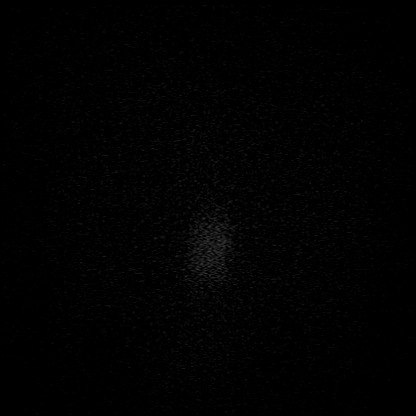

[Series 16: T1 · axial · 1.0mm · 0.98mm/px · z∈[-137,+37]mm · 10 of 176 slices shown (2 of 2)]
[im 1/176]
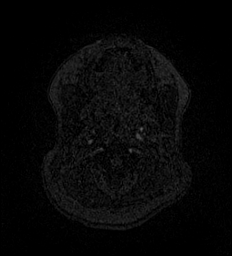
[im 20/176]
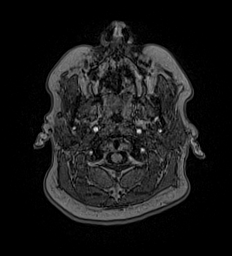
[im 39/176]
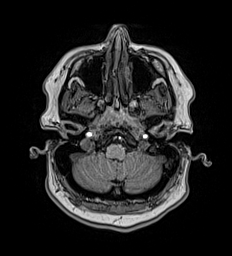
[im 59/176]
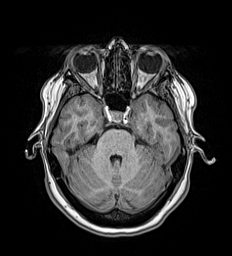
[im 78/176]
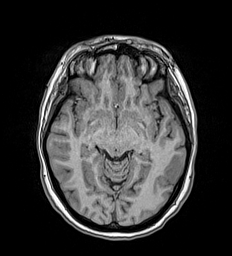
[im 98/176]
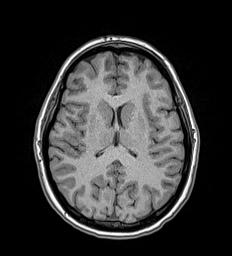
[im 117/176]
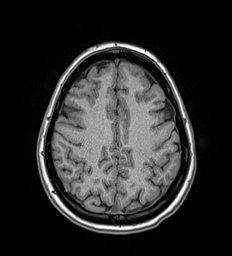
[im 137/176]
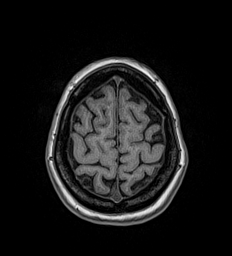
[im 156/176]
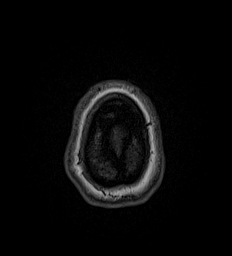
[im 176/176]
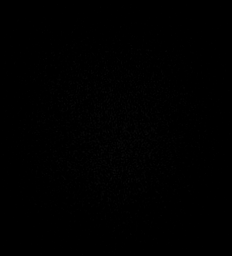

[Series 17: T2 · coronal · 5.0mm · 0.57mm/px · 2 of 29 slices shown (2 of 2)]
[im 1/29]
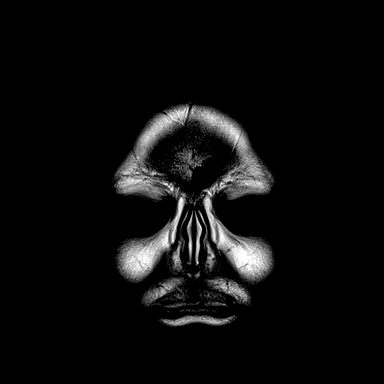
[im 29/29]
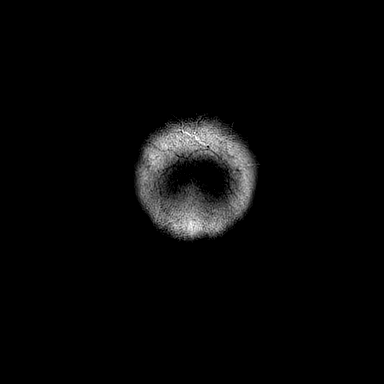

[Series 18: mag_images · axial · 3.0mm · 0.90mm/px · z∈[-137,+39]mm · 3 of 60 slices shown (2 of 2)]
[im 1/60]
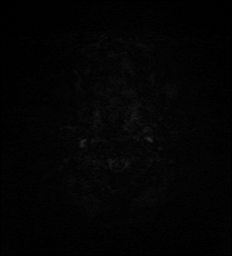
[im 30/60]
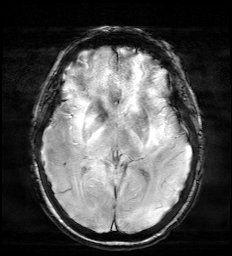
[im 60/60]
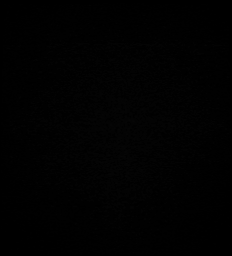

[Series 19: pha_images · axial · 3.0mm · 0.90mm/px · z∈[-137,+39]mm · 3 of 59 slices shown (2 of 2)]
[im 1/59]
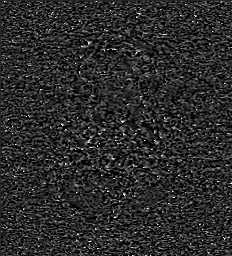
[im 30/59]
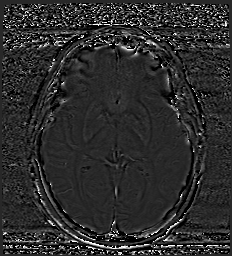
[im 59/59]
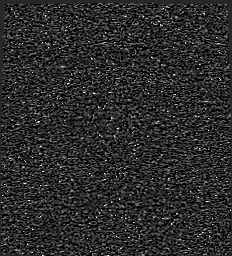

[Series 20: swi_images · axial · 3.0mm · 0.90mm/px · z∈[-137,+39]mm · 3 of 60 slices shown (2 of 2)]
[im 1/60]
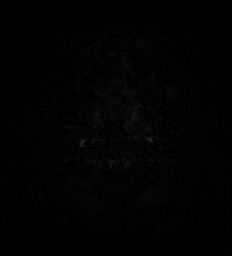
[im 30/60]
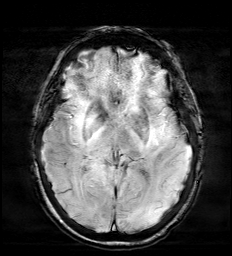
[im 60/60]
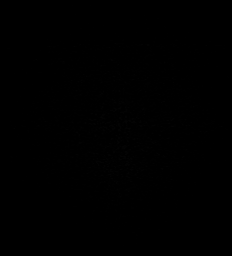

[46 of 48 positions shown; findings below may reference images not displayed]

FINDINGS: MRI HEAD FINDINGS:

Brain: Cerebral volume within normal limits for patient age. No
focal parenchymal signal abnormality identified.

No abnormal foci of restricted diffusion to suggest acute or
subacute ischemia. Gray-white matter differentiation well
maintained. No encephalomalacia to suggest chronic infarction. No
foci of susceptibility artifact to suggest acute or chronic
intracranial hemorrhage.

No mass lesion, midline shift or mass effect. No hydrocephalus. No
extra-axial fluid collection. Major dural sinuses are grossly
patent.

Incidental note made of a partially empty sella. Suprasellar region
normal. Midline structures intact.

Vascular: Major intracranial vascular flow voids well maintained and
normal in appearance.

Skull and upper cervical spine: Craniocervical junction normal.
Visualized upper cervical spine within normal limits. Bone marrow
signal intensity normal. No scalp soft tissue abnormality.

Sinuses/Orbits: Subtle bulging of the optic discs at the posterior
globes, likely reflecting papilledema as provided in the history.
Globes orbital soft tissues demonstrate no other acute finding.

Scattered mucosal thickening noted about the ethmoidal air cells and
maxillary sinuses. Paranasal sinuses are otherwise clear. No
significant mastoid effusion. Inner ear structures grossly normal.

Other: None.

MRV HEAD FINDINGS:

Normal flow related signal and enhancement seen throughout the
superior sagittal sinus to the torcula. Transverse and sigmoid
sinuses are patent as are the visualized proximal internal jugular
veins. Focal stenoses at the junctions of the transverse and sigmoid
sinuses noted bilaterally (series 8, image 1). Straight sinus, vein
of Odilon, internal cerebral veins, and basal veins of [place]
appear patent. No visible abnormality about the cavernous sinus.
Superior orbital veins symmetric and within normal limits. No dural
sinus thrombosis. No appreciable cortical venous thrombosis.
IMPRESSION: 1. Partially empty sella with focal stenoses at the junctions of the
transverse and sigmoid sinuses bilaterally. Findings are
nonspecific, but can be seen in the setting of idiopathic
intracranial hypertension. Correlation with LP and opening pressure
suggested.
2. Subtle bulging of the optic discs at the posterior globes
bilaterally, consistent with history of papilledema.
3. Otherwise normal MRI of the brain and normal intracranial MRV. No
other acute intracranial abnormality. No evidence for dural sinus
thrombosis.

## 2022-01-29 ENCOUNTER — Other Ambulatory Visit: Payer: Self-pay

## 2022-01-29 ENCOUNTER — Encounter: Payer: Self-pay | Admitting: Emergency Medicine

## 2022-01-29 ENCOUNTER — Ambulatory Visit
Admission: EM | Admit: 2022-01-29 | Discharge: 2022-01-29 | Disposition: A | Discharge status: Home / Self Care | Payer: Medicare Other | Attending: Internal Medicine | Admitting: Internal Medicine

## 2022-01-29 DIAGNOSIS — A084 Viral intestinal infection, unspecified: Secondary | ICD-10-CM

## 2022-01-29 MED ORDER — PROMETHAZINE HCL 25 MG PO TABS: 25.0000 mg | ORAL_TABLET | Freq: Four times a day (QID) | ORAL | 0 refills | Status: DC | PRN

## 2022-01-29 NOTE — Discharge Instructions (Signed)
Get a couple electrolyte fluid bottles and drink them today along with 80 oz  of water. Stay on bland diet today, and advance slowly starting tomorrow. If you get worse, go to the ER.

## 2022-01-29 NOTE — ED Provider Notes (Signed)
[facility]    CSN: [id number] Arrival date & time: 01/29/22  1143      History   Chief Complaint Chief Complaint  Patient presents with   Nausea    Vomiting and light headed - Entered by patient    HPI Natalie Rose is a 41 y.o. female who presents with onset of vomiting early this am, x 3 so far. Also has had 2 small episodes of diarrhea. Has been feeling light headed. Denies sweating or being out much yesterday. Denies any concern for pregnancy. Has not been around anyone sick. Denies pain anywhere. She ate breakfast food at her home last night with her neighbor. She hs not checked on her neighbor yet.     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      Home Medications    Prior to Admission medications   Medication Sig Start Date End Date Taking? Authorizing Provider  promethazine (PHENERGAN) 25 MG tablet Take 1 tablet (25 mg total) by mouth every 6 (six) hours as needed for nausea or vomiting. 01/29/22  Yes Rodriguez-Southworth, Nettie Elm, PA-C  ferrous sulfate (FEROSUL) 325 (65 FE) MG tablet Take 325 mg by mouth daily with breakfast.  04/29/20  [provider]  aluminum-magnesium  hydroxide-simethicone (MAALOX) 200-200-20 MG/5ML SUSP Take 30 mLs by mouth 4 (four) times daily -  before meals and at bedtime. 05/29/21   Valinda Hoar, NP  cholecalciferol (VITAMIN D3) 25 MCG (1000 UNIT) tablet Take 5,000 Units by mouth at bedtime.    [provider]  COMBIVENT RESPIMAT 20-100 MCG/ACT AERS respimat 1 puff 4 (four) times daily. 11/29/20   [provider]  dicyclomine (BENTYL) 10 MG capsule Take 1 capsule (10 mg total) by mouth in the morning and at bedtime for 14 days. 05/29/21 06/12/21  Valinda Hoar, NP  erythromycin ophthalmic ointment Place a 1/2 inch ribbon of ointment into the lower eyelid q6h 03/08/21   Eusebio Friendly B, PA-C  famotidine (PEPCID) 20 MG tablet SMARTSIG:1 Tablet(s) By Mouth Every Evening Patient not taking: Reported on 01/29/2022 01/20/21   [provider]  hydrOXYzine (ATARAX/VISTARIL) 50 MG tablet Take 50 mg by mouth 3 (three) times daily as needed. 12/09/20   [provider]  loperamide (IMODIUM) 2 MG capsule Take 1 capsule (2 mg total) by mouth 4 (four) times daily as needed for diarrhea or loose stools. 05/29/21   White, Adrienne R, NP  LYBALVI 10-10 MG TABS Take 1 tablet by mouth daily. This is to replace Zyprexa 10 mg daily although she has not started taking yet 12/09/20   [provider]  metFORMIN (GLUCOPHAGE) 500 MG tablet Take 1 tablet (500 mg total) by mouth 2 (two)  times daily with a meal. 12/17/20 06/09/21  Dahal, Melina Schools, MD  mirtazapine (REMERON) 15 MG tablet Take 15 mg by mouth at bedtime.    [provider]  naproxen (NAPROSYN) 500 MG tablet Take 500 mg by mouth daily as needed. 11/29/20   [provider]  ondansetron (ZOFRAN ODT) 4 MG disintegrating tablet Take 1 tablet (4 mg total) by mouth every 8 (eight) hours as needed. 03/12/21   Jene Every, MD  oxyCODONE (OXY IR/ROXICODONE) 5 MG immediate release tablet Take by mouth. Patient not taking: Reported on 01/29/2022 02/03/21   [provider]  pantoprazole (PROTONIX) 40 MG tablet Take 1 tablet (40 mg total) by mouth daily. Patient not taking: Reported on 01/29/2022 04/29/20   Domenick Gong, MD  prazosin (MINIPRESS) 2 MG capsule Take 4 mg by mouth at bedtime.    [provider]  Sertraline HCl 150 MG CAPS Take 1 capsule by mouth daily. 12/09/20   [provider]  SUMAtriptan (IMITREX) 50 MG tablet Take 50 mg by mouth every 2 (two) hours as needed. 11/29/20   [provider]  SUMAtriptan (IMITREX) 50 MG tablet Take by mouth. 11/29/20 11/30/21  [provider]  traZODone (DESYREL) 100 MG tablet Take 100 mg by mouth at bedtime.    [provider]  eszopiclone (LUNESTA) 2 MG TABS tablet Take 2 mg by mouth at bedtime as needed for sleep. Take immediately before bedtime  04/29/20  [provider]  FLUoxetine (PROZAC) 40 MG capsule Take 40 mg by mouth daily.  10/08/19  [provider]  lurasidone (LATUDA) 40 MG TABS tablet Take 40 mg by mouth daily with breakfast.  10/08/19  [provider]    Family History Family History  Problem Relation Age of Onset   Osteopenia Mother    Heart failure Mother    Osteoarthritis Mother    Depression Mother    Diabetes Father     Social History Social History   Tobacco Use   Smoking status: Every Day    Packs/day: 1.00    Types: Cigarettes   Smokeless tobacco: Never  Vaping Use   Vaping Use: Never used  Substance Use Topics   Alcohol use: Not Currently   Drug use: Yes    Frequency: 3.0 times per week    Types: Marijuana     Allergies   Diazepam and Lurasidone   Review of Systems Review of Systems  Constitutional:  Negative for chills, diaphoresis and fever.  Respiratory:  Negative for chest tightness.   Cardiovascular:  Negative for chest pain.  Gastrointestinal:  Positive for diarrhea, nausea and vomiting. Negative for abdominal pain.  Genitourinary:  Negative for difficulty urinating.      Physical Exam Triage Vital Signs ED Triage Vitals  Enc Vitals Group     BP 01/29/22 1210 99/74     Pulse Rate 01/29/22 1210 81     Resp 01/29/22 1210 18     Temp 01/29/22 1210 98.1 F (36.7 C)     Temp Source 01/29/22 1210 Oral     SpO2 01/29/22 1210 97 %     Weight --      Height --      Head Circumference --      Peak Flow --      Pain Score 01/29/22 1206 0     Pain Loc --      Pain Edu? --      Excl. in GC? --    Orthostatic VS  for the past 24 hrs:  BP- Lying Pulse- Lying BP- Sitting Pulse- Sitting BP- Standing at 0 minutes Pulse- Standing at 0 minutes  01/29/22 1212 95/66 74 97/69 80 97/70 81    Updated Vital Signs BP 99/74 (BP Location: Left Arm)   Pulse 81   Temp 98.1 F (36.7 C) (Oral)   Resp 18   LMP 01/02/2022   SpO2 97%   Visual Acuity Right Eye Distance:   Left Eye Distance:   Bilateral Distance:    Right Eye Near:   Left Eye Near:    Bilateral Near:     Physical Exam Vitals and nursing note reviewed.  Constitutional:      General: She is not in acute distress.    Appearance: She is obese. She is not toxic-appearing.  HENT:     Right Ear: External ear normal.     Left Ear: External ear normal.     Mouth/Throat:     Mouth: Mucous membranes are moist.  Eyes:     General: No scleral icterus.    Conjunctiva/sclera: Conjunctivae normal.  Cardiovascular:     Rate and Rhythm: Normal rate and regular rhythm.  Pulmonary:     Effort: Pulmonary effort is normal.  Abdominal:     General: Bowel sounds are normal.     Palpations: Abdomen is soft. There is no mass.     Tenderness: There is no abdominal tenderness. There is no guarding or rebound.  Musculoskeletal:        General: Normal range of motion.     Cervical back: Neck supple.  Skin:    General: Skin is warm and dry.     Findings: No rash.  Neurological:     Mental Status: She is alert and oriented to person, place, and time.     Gait: Gait normal.  Psychiatric:        Mood and  Affect: Mood normal.        Behavior: Behavior normal.        Thought Content: Thought content normal.        Judgment: Judgment normal.      UC Treatments / Results  Labs (all labs ordered are listed, but only abnormal results are displayed) Labs Reviewed - No data to display  EKG   Radiology No results found.  Procedures Procedures (including critical care time)  Medications Ordered in UC Medications - No data to display  Initial Impression / Assessment and Plan / UC Course  I have reviewed the triage vital signs and the nursing notes.  Viral GE  I placed her on Phenergan as noted. Advised to push fluids. See instructions.  Final Clinical Impressions(s) / UC Diagnoses   Final diagnoses:  Viral gastroenteritis     Discharge Instructions      Get a couple electrolyte fluid bottles and drink them today along with 80 oz  of water. Stay on bland diet today, and advance slowly starting tomorrow. If you get worse, go to the ER.      ED Prescriptions     Medication Sig Dispense Auth. Provider   promethazine (PHENERGAN) 25 MG tablet Take 1 tablet (25 mg total) by mouth every 6 (six) hours as needed for nausea or vomiting. 30 tablet Rodriguez-Southworth, Nettie Elm, PA-C      PDMP not reviewed this encounter.   Garey Ham, Cordelia Poche 01/29/22 1236

## 2022-01-29 NOTE — ED Triage Notes (Signed)
Vomiting since early this morning, 3 episodes.  Reports "a little" diarrhea, reports 2 episodes.  Denies pain. No known sick exposures.  Patient reports feeling light headed

## 2022-06-24 ENCOUNTER — Encounter: Payer: Self-pay | Admitting: Emergency Medicine

## 2022-06-24 ENCOUNTER — Encounter: Payer: Self-pay | Admitting: Psychiatry

## 2022-06-24 ENCOUNTER — Emergency Department: Payer: 59

## 2022-06-24 ENCOUNTER — Other Ambulatory Visit: Payer: Self-pay

## 2022-06-24 ENCOUNTER — Emergency Department (EMERGENCY_DEPARTMENT_HOSPITAL)
Admission: EM | Admit: 2022-06-24 | Discharge: 2022-06-24 | Disposition: A | Discharge status: Other Acute Inpatient Hospital | Payer: 59 | Source: Home / Self Care | Attending: Emergency Medicine | Admitting: Emergency Medicine

## 2022-06-24 ENCOUNTER — Inpatient Hospital Stay
Admission: AD | Admit: 2022-06-24 | Discharge: 2022-07-02 | DRG: 885 | Disposition: A | Discharge status: Home / Self Care | Payer: 59 | Source: Intra-hospital | Attending: Psychiatry | Admitting: Psychiatry

## 2022-06-24 DIAGNOSIS — Z888 Allergy status to other drugs, medicaments and biological substances status: Secondary | ICD-10-CM

## 2022-06-24 DIAGNOSIS — R0789 Other chest pain: Secondary | ICD-10-CM | POA: Insufficient documentation

## 2022-06-24 DIAGNOSIS — Z8616 Personal history of COVID-19: Secondary | ICD-10-CM | POA: Insufficient documentation

## 2022-06-24 DIAGNOSIS — Z9151 Personal history of suicidal behavior: Secondary | ICD-10-CM | POA: Diagnosis not present

## 2022-06-24 DIAGNOSIS — R45851 Suicidal ideations: Secondary | ICD-10-CM

## 2022-06-24 DIAGNOSIS — Z79899 Other long term (current) drug therapy: Secondary | ICD-10-CM | POA: Diagnosis not present

## 2022-06-24 DIAGNOSIS — F4310 Post-traumatic stress disorder, unspecified: Secondary | ICD-10-CM | POA: Diagnosis present

## 2022-06-24 DIAGNOSIS — G43909 Migraine, unspecified, not intractable, without status migrainosus: Secondary | ICD-10-CM | POA: Diagnosis present

## 2022-06-24 DIAGNOSIS — F410 Panic disorder [episodic paroxysmal anxiety] without agoraphobia: Secondary | ICD-10-CM | POA: Diagnosis present

## 2022-06-24 DIAGNOSIS — Z818 Family history of other mental and behavioral disorders: Secondary | ICD-10-CM

## 2022-06-24 DIAGNOSIS — Z5982 Transportation insecurity: Secondary | ICD-10-CM

## 2022-06-24 DIAGNOSIS — Z7984 Long term (current) use of oral hypoglycemic drugs: Secondary | ICD-10-CM | POA: Diagnosis not present

## 2022-06-24 DIAGNOSIS — Z1152 Encounter for screening for COVID-19: Secondary | ICD-10-CM

## 2022-06-24 DIAGNOSIS — S61519A Laceration without foreign body of unspecified wrist, initial encounter: Secondary | ICD-10-CM | POA: Diagnosis present

## 2022-06-24 DIAGNOSIS — E119 Type 2 diabetes mellitus without complications: Secondary | ICD-10-CM | POA: Diagnosis present

## 2022-06-24 DIAGNOSIS — Z5941 Food insecurity: Secondary | ICD-10-CM

## 2022-06-24 DIAGNOSIS — R079 Chest pain, unspecified: Secondary | ICD-10-CM

## 2022-06-24 DIAGNOSIS — F332 Major depressive disorder, recurrent severe without psychotic features: Principal | ICD-10-CM | POA: Diagnosis present

## 2022-06-24 DIAGNOSIS — Z8249 Family history of ischemic heart disease and other diseases of the circulatory system: Secondary | ICD-10-CM

## 2022-06-24 DIAGNOSIS — X788XXA Intentional self-harm by other sharp object, initial encounter: Secondary | ICD-10-CM | POA: Diagnosis present

## 2022-06-24 DIAGNOSIS — X789XXA Intentional self-harm by unspecified sharp object, initial encounter: Secondary | ICD-10-CM | POA: Diagnosis present

## 2022-06-24 DIAGNOSIS — S60812D Abrasion of left wrist, subsequent encounter: Secondary | ICD-10-CM

## 2022-06-24 DIAGNOSIS — X789XXD Intentional self-harm by unspecified sharp object, subsequent encounter: Secondary | ICD-10-CM | POA: Diagnosis present

## 2022-06-24 DIAGNOSIS — Z833 Family history of diabetes mellitus: Secondary | ICD-10-CM

## 2022-06-24 DIAGNOSIS — F419 Anxiety disorder, unspecified: Secondary | ICD-10-CM | POA: Insufficient documentation

## 2022-06-24 DIAGNOSIS — F411 Generalized anxiety disorder: Secondary | ICD-10-CM | POA: Diagnosis present

## 2022-06-24 DIAGNOSIS — F17210 Nicotine dependence, cigarettes, uncomplicated: Secondary | ICD-10-CM | POA: Diagnosis present

## 2022-06-24 LAB — URINE DRUG SCREEN, QUALITATIVE (ARMC ONLY)
Amphetamines, Ur Screen: NOT DETECTED
Barbiturates, Ur Screen: NOT DETECTED
Benzodiazepine, Ur Scrn: NOT DETECTED
Cannabinoid 50 Ng, Ur ~~LOC~~: POSITIVE — AB
Cocaine Metabolite,Ur ~~LOC~~: NOT DETECTED
MDMA (Ecstasy)Ur Screen: NOT DETECTED
Methadone Scn, Ur: NOT DETECTED
Opiate, Ur Screen: NOT DETECTED
Phencyclidine (PCP) Ur S: NOT DETECTED
Tricyclic, Ur Screen: NOT DETECTED

## 2022-06-24 LAB — COMPREHENSIVE METABOLIC PANEL
ALT: 30 U/L (ref 0–44)
AST: 29 U/L (ref 15–41)
Albumin: 4.4 g/dL (ref 3.5–5.0)
Alkaline Phosphatase: 68 U/L (ref 38–126)
Anion gap: 11 (ref 5–15)
BUN: 15 mg/dL (ref 6–20)
CO2: 22 mmol/L (ref 22–32)
Calcium: 9.7 mg/dL (ref 8.9–10.3)
Chloride: 106 mmol/L (ref 98–111)
Creatinine, Ser: 0.91 mg/dL (ref 0.44–1.00)
GFR, Estimated: >60 mL/min (ref >60)
Glucose, Bld: 172 mg/dL — ABNORMAL HIGH (ref 70–99)
Potassium: 3.8 mmol/L (ref 3.5–5.1)
Sodium: 139 mmol/L (ref 135–145)
Total Bilirubin: 0.5 mg/dL (ref 0.3–1.2)
Total Protein: 8.2 g/dL — ABNORMAL HIGH (ref 6.5–8.1)

## 2022-06-24 LAB — URINALYSIS, ROUTINE W REFLEX MICROSCOPIC
Bilirubin Urine: NEGATIVE
Glucose, UA: NEGATIVE mg/dL
Hgb urine dipstick: NEGATIVE
Ketones, ur: 20 mg/dL — AB
Leukocytes,Ua: NEGATIVE
Nitrite: NEGATIVE
Protein, ur: NEGATIVE mg/dL
Specific Gravity, Urine: 1.012 (ref 1.005–1.030)
pH: 8 (ref 5.0–8.0)

## 2022-06-24 LAB — CBC WITH DIFFERENTIAL/PLATELET
Abs Immature Granulocytes: 0.09 10*3/uL — ABNORMAL HIGH (ref 0.00–0.07)
Basophils Absolute: 0 10*3/uL (ref 0.0–0.1)
Basophils Relative: 0 %
Eosinophils Absolute: 0.2 10*3/uL (ref 0.0–0.5)
Eosinophils Relative: 1 %
HCT: 39.2 % (ref 36.0–46.0)
Hemoglobin: 13.1 g/dL (ref 12.0–15.0)
Immature Granulocytes: 1 %
Lymphocytes Relative: 16 %
Lymphs Abs: 2.7 10*3/uL (ref 0.7–4.0)
MCH: 27.7 pg (ref 26.0–34.0)
MCHC: 33.4 g/dL (ref 30.0–36.0)
MCV: 82.9 fL (ref 80.0–100.0)
Monocytes Absolute: 0.7 10*3/uL (ref 0.1–1.0)
Monocytes Relative: 4 %
Neutro Abs: 13.2 10*3/uL — ABNORMAL HIGH (ref 1.7–7.7)
Neutrophils Relative %: 78 %
Platelets: 333 10*3/uL (ref 150–400)
RBC: 4.73 MIL/uL (ref 3.87–5.11)
RDW: 14.7 % (ref 11.5–15.5)
WBC: 16.9 10*3/uL — ABNORMAL HIGH (ref 4.0–10.5)
nRBC: 0 % (ref 0.0–0.2)

## 2022-06-24 LAB — RESP PANEL BY RT-PCR (RSV, FLU A&B, COVID)  RVPGX2
Influenza A by PCR: NEGATIVE
Influenza B by PCR: NEGATIVE
Resp Syncytial Virus by PCR: NEGATIVE
SARS Coronavirus 2 by RT PCR: NEGATIVE

## 2022-06-24 LAB — LIPASE, BLOOD: Lipase: 37 U/L (ref 11–51)

## 2022-06-24 LAB — TSH: TSH: 2.816 u[IU]/mL (ref 0.350–4.500)

## 2022-06-24 LAB — CBG MONITORING, ED: Glucose-Capillary: 179 mg/dL — ABNORMAL HIGH (ref 70–99)

## 2022-06-24 LAB — ACETAMINOPHEN LEVEL: Acetaminophen (Tylenol), Serum: <10 ug/mL — ABNORMAL LOW (ref 10–30)

## 2022-06-24 LAB — ETHANOL: Alcohol, Ethyl (B): <10 mg/dL (ref <10)

## 2022-06-24 LAB — TROPONIN I (HIGH SENSITIVITY): Troponin I (High Sensitivity): 5 ng/L (ref <18)

## 2022-06-24 LAB — POC URINE PREG, ED: Preg Test, Ur: NEGATIVE

## 2022-06-24 MED ORDER — METFORMIN HCL 500 MG PO TABS
500.0000 mg | ORAL_TABLET | Freq: Two times a day (BID) | ORAL | Status: DC
  Administered 2022-06-24 – 2022-07-02 (×16): 500 mg via ORAL
  Filled 2022-06-24 (×16): qty 1

## 2022-06-24 MED ORDER — NICOTINE 14 MG/24HR TD PT24: 14.0000 mg | MEDICATED_PATCH | Freq: Every day | TRANSDERMAL | Status: DC

## 2022-06-24 MED ORDER — OLANZAPINE 10 MG PO TABS: 10.0000 mg | ORAL_TABLET | Freq: Every day | ORAL | Status: DC

## 2022-06-24 MED ORDER — TRAZODONE HCL 100 MG PO TABS
100.0000 mg | ORAL_TABLET | Freq: Every day | ORAL | Status: DC
  Administered 2022-06-24 – 2022-07-01 (×8): 100 mg via ORAL
  Filled 2022-06-24 (×8): qty 1

## 2022-06-24 MED ORDER — NICOTINE 14 MG/24HR TD PT24
14.0000 mg | MEDICATED_PATCH | Freq: Every day | TRANSDERMAL | Status: DC
  Administered 2022-06-25: 14 mg via TRANSDERMAL
  Filled 2022-06-24 (×4): qty 1

## 2022-06-24 MED ORDER — MIRTAZAPINE 15 MG PO TABS: 15.0000 mg | ORAL_TABLET | Freq: Every day | ORAL | Status: DC

## 2022-06-24 MED ORDER — MIRTAZAPINE 15 MG PO TABS
15.0000 mg | ORAL_TABLET | Freq: Every day | ORAL | Status: DC
  Administered 2022-06-24 – 2022-06-27 (×4): 15 mg via ORAL
  Filled 2022-06-24 (×4): qty 1

## 2022-06-24 MED ORDER — VITAMIN D 25 MCG (1000 UNIT) PO TABS
5000.0000 [IU] | ORAL_TABLET | Freq: Every day | ORAL | Status: DC
  Administered 2022-06-24 – 2022-07-01 (×7): 5000 [IU] via ORAL
  Filled 2022-06-24 (×8): qty 5

## 2022-06-24 MED ORDER — PRAZOSIN HCL 2 MG PO CAPS: 4.0000 mg | ORAL_CAPSULE | Freq: Every day | ORAL | Status: DC

## 2022-06-24 MED ORDER — ALUM & MAG HYDROXIDE-SIMETH 200-200-20 MG/5ML PO SUSP: 30.0000 mL | ORAL | Status: DC | PRN

## 2022-06-24 MED ORDER — DROPERIDOL 2.5 MG/ML IJ SOLN
5.0000 mg | Freq: Once | INTRAMUSCULAR | Status: AC
  Administered 2022-06-24: 5 mg via INTRAMUSCULAR

## 2022-06-24 MED ORDER — ONDANSETRON 4 MG PO TBDP
4.0000 mg | ORAL_TABLET | Freq: Three times a day (TID) | ORAL | Status: DC | PRN
  Administered 2022-06-24 – 2022-06-29 (×10): 4 mg via ORAL
  Filled 2022-06-24 (×12): qty 1

## 2022-06-24 MED ORDER — CLONIDINE HCL 0.1 MG PO TABS
0.1000 mg | ORAL_TABLET | Freq: Every day | ORAL | Status: DC | PRN
  Administered 2022-06-25: 0.1 mg via ORAL
  Filled 2022-06-24: qty 1

## 2022-06-24 MED ORDER — METFORMIN HCL 500 MG PO TABS: 500.0000 mg | ORAL_TABLET | Freq: Two times a day (BID) | ORAL | Status: DC

## 2022-06-24 MED ORDER — PRAZOSIN HCL 2 MG PO CAPS
4.0000 mg | ORAL_CAPSULE | Freq: Every day | ORAL | Status: DC
  Administered 2022-06-24 – 2022-07-01 (×8): 4 mg via ORAL
  Filled 2022-06-24 (×8): qty 2

## 2022-06-24 MED ORDER — ONDANSETRON 4 MG PO TBDP: 4.0000 mg | ORAL_TABLET | Freq: Once | ORAL | Status: DC

## 2022-06-24 MED ORDER — HYDROXYZINE HCL 25 MG PO TABS: 50.0000 mg | ORAL_TABLET | Freq: Three times a day (TID) | ORAL | Status: DC | PRN

## 2022-06-24 MED ORDER — ONDANSETRON HCL 4 MG/2ML IJ SOLN
4.0000 mg | Freq: Once | INTRAMUSCULAR | Status: AC
  Administered 2022-06-24: 4 mg via INTRAVENOUS
  Filled 2022-06-24: qty 2

## 2022-06-24 MED ORDER — ACETAMINOPHEN 325 MG PO TABS
650.0000 mg | ORAL_TABLET | Freq: Four times a day (QID) | ORAL | Status: DC | PRN
  Administered 2022-06-25 – 2022-07-02 (×6): 650 mg via ORAL
  Filled 2022-06-24 (×7): qty 2

## 2022-06-24 MED ORDER — LORAZEPAM 1 MG PO TABS
1.0000 mg | ORAL_TABLET | Freq: Once | ORAL | Status: AC
  Administered 2022-06-24: 1 mg via ORAL
  Filled 2022-06-24: qty 1

## 2022-06-24 MED ORDER — CLONIDINE HCL 0.1 MG PO TABS
0.1000 mg | ORAL_TABLET | Freq: Every day | ORAL | Status: DC | PRN
  Administered 2022-06-24: 0.1 mg via ORAL
  Filled 2022-06-24: qty 1

## 2022-06-24 MED ORDER — OLANZAPINE 10 MG PO TABS
10.0000 mg | ORAL_TABLET | Freq: Every day | ORAL | Status: DC
  Administered 2022-06-24 – 2022-07-01 (×8): 10 mg via ORAL
  Filled 2022-06-24 (×8): qty 1

## 2022-06-24 MED ORDER — HYDROXYZINE HCL 50 MG PO TABS
50.0000 mg | ORAL_TABLET | Freq: Three times a day (TID) | ORAL | Status: DC | PRN
  Administered 2022-06-24 – 2022-06-29 (×6): 50 mg via ORAL
  Filled 2022-06-24 (×6): qty 1

## 2022-06-24 MED ORDER — MAGNESIUM HYDROXIDE 400 MG/5ML PO SUSP: 30.0000 mL | Freq: Every day | ORAL | Status: DC | PRN

## 2022-06-24 MED ORDER — TRAZODONE HCL 100 MG PO TABS: 100.0000 mg | ORAL_TABLET | Freq: Every day | ORAL | Status: DC

## 2022-06-24 NOTE — Tx Team (Signed)
Initial Treatment Plan 06/24/2022 4:53 PM Natalie Rose ASN:[id number]    PATIENT STRESSORS: Financial difficulties     PATIENT STRENGTHS: General fund of knowledge  Motivation for treatment/growth    PATIENT IDENTIFIED PROBLEMS: SI   Anxiety (panic attacks)  Depression                 DISCHARGE CRITERIA:  Ability to meet basic life and health needs Improved stabilization in mood, thinking, and/or behavior Motivation to continue treatment in a less acute level of care Need for constant or close observation no longer present Reduction of life-threatening or endangering symptoms to within safe limits  PRELIMINARY DISCHARGE PLAN: Outpatient therapy Return to previous living arrangement  PATIENT/FAMILY INVOLVEMENT: This treatment plan has been presented to and reviewed with the patient, Natalie Rose. The patient has been given the opportunity to ask questions and make suggestions.  Suzetta Timko, RN 06/24/2022, 4:53 PM

## 2022-06-24 NOTE — ED Notes (Signed)
Patient Items: Blue Jeans [employer] Sports Bra [employer] Socks 2 Debit Card, Oncologist, 2 EBT card, gift card 8$ dollars and change Black Shirts Blue Panties [employer] Shoes Silver Necklace Silver Ring Black Jacket [employer] Sweatshirt  [place] [employer](gave to [employer]) [employer]

## 2022-06-24 NOTE — ED Notes (Signed)
Pt offered meal tray, pt refused. Pt sitting up in bed, coughing into emesis bag at times. No emesis noted, no dry heaving noted.

## 2022-06-24 NOTE — ED Provider Notes (Signed)
[facility] Provider Note    Event Date/Time   First MD Initiated Contact with Patient 06/24/22 [phone]     (approximate)   History   Anxiety   HPI  Natalie Rose is a 42 y.o. female with history of depression, anxiety, diabetes presents emergency department with anxiety, chest pressure and now has started vomiting.  Patient was not given aspirin with hospitalist patient states she could not take it due to nausea..  Patient was also seen at an ER yesterday however blood work does not show a troponin.  Patient denies SI/HI.  All she is saying is "just please help me.  "      Physical Exam   Triage Vital Signs: ED Triage Vitals  Enc Vitals Group     BP --      Pulse --      Resp --      Temp --      Temp src --      SpO2 --      Weight 06/24/22 0833 178 lb (80.7 kg)     Height 06/24/22 0827 4\' 11"  (1.499 m)     Head Circumference --      Peak Flow --      Pain Score --      Pain Loc --      Pain Edu? --      Excl. in [place]? --     Most recent vital signs: Vitals:   06/24/22 0844 06/24/22 0844  BP: (!) 141/89 (!) 141/89  Pulse: 85 86  Resp: (!) 22 20  Temp: 98.5 F (36.9 C)   SpO2: 98% 98%     General: Awake, no distress.  Patient is little diaphoretic and pale CV:  Good peripheral perfusion. regular rate and  rhythm Resp:  Normal effort. Lungs cta Abd:  No distention.  Patient actively vomiting Other:      ED Results / Procedures / Treatments   Labs (all labs ordered are listed, but only abnormal results are displayed) Labs Reviewed  COMPREHENSIVE METABOLIC PANEL - Abnormal; Notable for the following components:      Result Value   Glucose, Bld 172 (*)    Total Protein 8.2 (*)    All other components within normal limits  CBC WITH DIFFERENTIAL/PLATELET - Abnormal; Notable for the following components:   WBC 16.9 (*)    Neutro Abs 13.2 (*)    Abs Immature Granulocytes 0.09 (*)    All other components within normal  limits  ACETAMINOPHEN LEVEL - Abnormal; Notable for the following components:   Acetaminophen (Tylenol), Serum <10 (*)    All other components within normal limits  CBG MONITORING, ED - Abnormal; Notable for the following components:   Glucose-Capillary 179 (*)    All other components within normal limits  LIPASE, BLOOD  TSH  ETHANOL  URINALYSIS, ROUTINE W REFLEX MICROSCOPIC  URINE DRUG SCREEN, QUALITATIVE ([facility] ONLY)  POC URINE PREG, ED  TROPONIN I (HIGH SENSITIVITY)  TROPONIN I (HIGH SENSITIVITY)     EKG  EKG   RADIOLOGY Chest x-ray    PROCEDURES:   Procedures   MEDICATIONS ORDERED IN ED: Medications  LORazepam (ATIVAN) tablet 1 mg (1 mg Oral Given 06/24/22 0855)  ondansetron (ZOFRAN) injection 4 mg (4 mg Intravenous Given 06/24/22 0853)  droperidol (INAPSINE) 2.5 MG/ML injection 5 mg (5 mg Intramuscular Given 06/24/22 0942)     IMPRESSION / MDM / ASSESSMENT AND PLAN / ED COURSE  I reviewed the triage vital signs and the nursing notes.                              Differential diagnosis includes, but is not limited to, anxiety, panic attack, MI, pancreatitis, acute cholecystitis, DKA  Patient's presentation is most consistent with acute complicated illness / injury requiring diagnostic workup.   Due to the patient being diaphoretic along with chest pressure we will go ahead and start cardiac workup.  Also concerns for panic attack so we will give her lorazepam.  Patient states she has tolerated that in the past.  Does have in her chart that she is allergic to Valium due to itching.  Nursing staff to place IV and obtain labs, EKG, ordered Zofran 4 mg IV, lorazepam p.o.   Patient is now stating that she has SI.  Called charge nurse.  Will move to major. Care transferred to Dr. Charna Archer  FINAL CLINICAL IMPRESSION(S) / ED DIAGNOSES   Final diagnoses:  Nonspecific chest pain  Suicidal ideation     Rx / DC Orders   ED Discharge Orders     None         Note:  This document was prepared using Dragon voice recognition software and may include unintentional dictation errors.    Versie Starks, PA-C 06/24/22 1010    Blake Divine, MD 06/24/22 [id number]

## 2022-06-24 NOTE — ED Notes (Signed)
States she wants to hurt her self   Provider aware

## 2022-06-24 NOTE — ED Provider Notes (Signed)
-----------------------------------------   9:50 AM on 06/24/2022 -----------------------------------------  Blood pressure (!) 141/89, pulse 86, temperature 98.5 F (36.9 C), temperature source Oral, resp. rate 20, height 4\' 11"  (1.499 m), weight 80.7 kg, SpO2 98 %.  Assuming care from [place].  In short, Natalie Rose is a 42 y.o. female with a chief complaint of Anxiety .  Refer to the original H&P for additional details.  The current plan of care is to complete further workup for nausea and chest pain, patient now complaining of suicidal ideation and will require further psychiatric workup.  Patient now aggressively scratching at her left arm, causing significant abrasion and stating that she is doing so because she is anxious and hearing voices telling her to harm herself.  She continues to feel nauseous but has a benign abdominal exam.  EKG reassuring with no arrhythmia, ischemia, or QT prolongation.  We will treat ongoing agitation and nausea with IM droperidol, patient placed under IVC due to threat to herself.  Labs remarkable for leukocytosis but otherwise reassuring with no significant anemia, electrolyte abnormality, or AKI.  Troponin within normal limits and I have low suspicion for ACS, PE, or other emergent cause for her chest pain.  Will further assess with chest x-ray, but anxiety seems to be the primary driver of her nausea and chest discomfort.  ----------------------------------------- 11:26 AM on 06/24/2022 ----------------------------------------- Chest x-ray is unremarkable, nausea and vomiting appears to have resolved.  Patient may be medically cleared for psychiatric disposition.  She has been evaluated by psychiatry, who recommends that she be admitted.  The patient has been placed in psychiatric observation due to the need to provide a safe environment for the patient while obtaining psychiatric consultation and evaluation, as well as ongoing medical and  medication management to treat the patient's condition.  The patient has been placed under full IVC at this time.     Blake Divine, MD 06/24/22 1126

## 2022-06-24 NOTE — ED Notes (Signed)
Pt is very anxious  Not staying on the bed  Pulling at IV  Encouraged to stay in bed    Pt keeps stating "please help me"

## 2022-06-24 NOTE — Consult Note (Signed)
[place] Psychiatry Consult   Reason for Consult:  suicidal ideations with a plan Referring Physician:  EDP Patient Identification: Natalie Rose MRN:  [id number] Principal Diagnosis: MDD (major depressive disorder), recurrent severe, without psychosis ([place]) Diagnosis:  Principal Problem:   MDD (major depressive disorder), recurrent severe, without psychosis ([place])   Total Time spent with patient: 45 minutes  Subjective:   Natalie Rose is a 42 y.o. female patient admitted with depression and suicide threats.  HPI:  42 yo female admitted for suicidal ideations with a plan to hang herself, past suicide attempts and hospitalizations.  High depression with no apparent trigger and lives alone, impulsive behaviors in the past.  High anxiety with an increase in panic attacks to daily ones.  No past traumas or symptoms.  Sleep is fair with medications, appetite decreased.   No psychosis, paranoia, or homicidal ideations.  Her mother is a good support system for her.  Past Psychiatric History: depression, anxiety, panic d/o  Risk to Self:  yes Risk to Others:  none Prior Inpatient Therapy:  a few Prior Outpatient Therapy:  [facility]  Past Medical History:  Past Medical History:  Diagnosis Date   Anxiety    Arthritis    Depression    Diabetes mellitus without complication ([place])    Mental health disorder    Migraines     Past Surgical History:  Procedure Laterality Date   MOUTH SURGERY     Family History:  Family History  Problem Relation Age of Onset   Osteopenia Mother    Heart failure Mother    Osteoarthritis Mother    Depression Mother    Diabetes Father    Family Psychiatric  History: see above Social History:  Social History   Substance and Sexual Activity  Alcohol Use Not Currently     Social History   Substance and Sexual Activity  Drug Use Yes   Frequency: 3.0 times per week   Types: Marijuana    Social History    Socioeconomic History   Marital status: Single    Spouse name: Not on file   Number of children: Not on file   Years of education: Not on file   Highest education level: Not on file  Occupational History   Not on file  Tobacco Use   Smoking status: Every Day    Packs/day: 1.00    Types: Cigarettes   Smokeless tobacco: Never  Vaping Use   Vaping Use: Never used  Substance and Sexual Activity   Alcohol use: Not Currently   Drug use: Yes    Frequency: 3.0 times per week    Types: Marijuana   Sexual activity: Yes    Birth control/protection: None  Other Topics Concern   Not on file  Social History Narrative   Not on file   Social Determinants of Health   Financial Resource Strain: Not on file  Food Insecurity: Not on file  Transportation Needs: Not on file  Physical Activity: Not on file  Stress: Not on file  Social Connections: Not on file   Additional Social History:    Allergies:   Allergies  Allergen Reactions   Diazepam Hives, Anxiety, Itching, Other (See Comments) and Rash    unknown unknown    Lurasidone     Labs:  Results for orders placed or performed during the hospital encounter of 06/24/22 (from the past 48 hour(s))  CBG monitoring, ED     Status: Abnormal   Collection Time:  06/24/22  8:43 AM  Result Value Ref Range   Glucose-Capillary 179 (H) 70 - 99 mg/dL    Comment: Glucose reference range applies only to samples taken after fasting for at least 8 hours.  Comprehensive metabolic panel     Status: Abnormal   Collection Time: 06/24/22  8:50 AM  Result Value Ref Range   Sodium 139 135 - 145 mmol/L   Potassium 3.8 3.5 - 5.1 mmol/L   Chloride 106 98 - 111 mmol/L   CO2 22 22 - 32 mmol/L   Glucose, Bld 172 (H) 70 - 99 mg/dL    Comment: Glucose reference range applies only to samples taken after fasting for at least 8 hours.   BUN 15 6 - 20 mg/dL   Creatinine, Ser 0.93 0.44 - 1.00 mg/dL   Calcium 9.7 8.9 - 81.8 mg/dL   Total Protein 8.2 (H)  6.5 - 8.1 g/dL   Albumin 4.4 3.5 - 5.0 g/dL   AST 29 15 - 41 U/L   ALT 30 0 - 44 U/L   Alkaline Phosphatase 68 38 - 126 U/L   Total Bilirubin 0.5 0.3 - 1.2 mg/dL   GFR, Estimated >29 >93 mL/min    Comment: (NOTE) Calculated using the CKD-EPI Creatinine Equation (2021)    Anion gap 11 5 - 15    Comment: Performed at [facility], [address] [place], [place] [address]  CBC with Differential     Status: Abnormal   Collection Time: 06/24/22  8:50 AM  Result Value Ref Range   WBC 16.9 (H) 4.0 - 10.5 K/uL   RBC 4.73 3.87 - 5.11 MIL/uL   Hemoglobin 13.1 12.0 - 15.0 g/dL   HCT 78.9 38.1 - 01.7 %   MCV 82.9 80.0 - 100.0 fL   MCH 27.7 26.0 - 34.0 pg   MCHC 33.4 30.0 - 36.0 g/dL   RDW 51.0 25.8 - 52.7 %   Platelets 333 150 - 400 K/uL   nRBC 0.0 0.0 - 0.2 %   Neutrophils Relative % 78 %   Neutro Abs 13.2 (H) 1.7 - 7.7 K/uL   Lymphocytes Relative 16 %   Lymphs Abs 2.7 0.7 - 4.0 K/uL   Monocytes Relative 4 %   Monocytes Absolute 0.7 0.1 - 1.0 K/uL   Eosinophils Relative 1 %   Eosinophils Absolute 0.2 0.0 - 0.5 K/uL   Basophils Relative 0 %   Basophils Absolute 0.0 0.0 - 0.1 K/uL   Immature Granulocytes 1 %   Abs Immature Granulocytes 0.09 (H) 0.00 - 0.07 K/uL    Comment: Performed at [facility], [address] [place], [place] [address]  Lipase, blood     Status: None   Collection Time: 06/24/22  8:50 AM  Result Value Ref Range   Lipase 37 11 - 51 U/L    Comment: Performed at [facility], [address] [place], [place] [address]  Troponin I (High Sensitivity)     Status: None   Collection Time: 06/24/22  8:50 AM  Result Value Ref Range   Troponin I (High Sensitivity) 5 <18 ng/L    Comment: (NOTE) Elevated high sensitivity troponin I (hsTnI) values and significant  changes across serial measurements may suggest ACS but many other  chronic and acute conditions are known to elevate hsTnI results.  Refer to the "Links" section for chest pain  algorithms and additional  guidance. Performed at [facility], [address] [place], [place] [address]  TSH     Status: None   Collection Time: 06/24/22  9:08 AM  Result Value Ref Range   TSH 2.816 0.350 - 4.500 uIU/mL    Comment: Performed by a 3rd Generation assay with a functional sensitivity of <=0.01 uIU/mL. Performed at [facility], [address] [place], [place] [address]   Acetaminophen level     Status: Abnormal   Collection Time: 06/24/22  9:08 AM  Result Value Ref Range   Acetaminophen (Tylenol), Serum <10 (L) 10 - 30 ug/mL    Comment: (NOTE) Therapeutic concentrations vary significantly. A range of 10-30 ug/mL  may be an effective concentration for many patients. However, some  are best treated at concentrations outside of this range. Acetaminophen concentrations >150 ug/mL at 4 hours after ingestion  and >50 ug/mL at 12 hours after ingestion are often associated with  toxic reactions.  Performed at [facility], [address] [place], [place] [address]   Ethanol     Status: None   Collection Time: 06/24/22  9:08 AM  Result Value Ref Range   Alcohol, Ethyl (B) <10 <10 mg/dL    Comment: (NOTE) Lowest detectable limit for serum alcohol is 10 mg/dL.  For medical purposes only. Performed at [facility], [address] [place], [place] [address]     No current facility-administered medications for this encounter.   Current Outpatient Medications  Medication Sig Dispense Refill   aluminum-magnesium hydroxide-simethicone (MAALOX) 209-470-96 MG/5ML SUSP Take 30 mLs by mouth 4 (four) times daily -  before meals and at bedtime. 1680 mL 0   cholecalciferol (VITAMIN D3) 25 MCG (1000 UNIT) tablet Take 5,000 Units by mouth at bedtime.     COMBIVENT RESPIMAT 20-100 MCG/ACT AERS respimat 1 puff 4 (four) times daily.     dicyclomine (BENTYL) 10 MG capsule Take 1 capsule (10 mg total) by mouth in the morning and at bedtime  for 14 days. 28 capsule 0   hydrOXYzine (ATARAX/VISTARIL) 50 MG tablet Take 50 mg by mouth 3 (three) times daily as needed.     loperamide (IMODIUM) 2 MG capsule Take 1 capsule (2 mg total) by mouth 4 (four) times daily as needed for diarrhea or loose stools. 12 capsule 0   LYBALVI 10-10 MG TABS Take 1 tablet by mouth daily. This is to replace Zyprexa 10 mg daily although she has not started taking yet     metFORMIN (GLUCOPHAGE) 500 MG tablet Take 1 tablet (500 mg total) by mouth 2 (two) times daily with a meal. 60 tablet 0   mirtazapine (REMERON) 15 MG tablet Take 15 mg by mouth at bedtime.     naproxen (NAPROSYN) 500 MG tablet Take 500 mg by mouth daily as needed.     ondansetron (ZOFRAN ODT) 4 MG disintegrating tablet Take 1 tablet (4 mg total) by mouth every 8 (eight) hours as needed. 20 tablet 0   pantoprazole (PROTONIX) 40 MG tablet Take 1 tablet (40 mg total) by mouth daily. (Patient not taking: Reported on 01/29/2022) 30 tablet 0   prazosin (MINIPRESS) 2 MG capsule Take 4 mg by mouth at bedtime.     promethazine (PHENERGAN) 25 MG tablet Take 1 tablet (25 mg total) by mouth every 6 (six) hours as needed for nausea or vomiting. 30 tablet 0   Sertraline HCl 150 MG CAPS Take 1 capsule by mouth daily.     SUMAtriptan (IMITREX) 50 MG tablet Take 50 mg by mouth every 2 (two) hours as needed.  SUMAtriptan (IMITREX) 50 MG tablet Take by mouth.     traZODone (DESYREL) 100 MG tablet Take 100 mg by mouth at bedtime.      Musculoskeletal: Strength & Muscle Tone: within normal limits Gait & Station: normal Patient leans: N/A  Psychiatric Specialty Exam: Physical Exam Vitals and nursing note reviewed.  Constitutional:      Appearance: Normal appearance.  HENT:     Head: Normocephalic.     Nose: Nose normal.  Pulmonary:     Effort: Pulmonary effort is normal.  Musculoskeletal:        General: Normal range of motion.     Cervical back: Normal range of motion.  Neurological:     General:  No focal deficit present.     Mental Status: She is alert and oriented to person, place, and time.  Psychiatric:        Attention and Perception: Attention and perception normal.        Mood and Affect: Mood is anxious and depressed.        Speech: Speech normal.        Behavior: Behavior normal. Behavior is cooperative.        Thought Content: Thought content includes suicidal ideation. Thought content includes suicidal plan.        Cognition and Memory: Cognition and memory normal.        Judgment: Judgment normal.     Review of Systems  Psychiatric/Behavioral:  Positive for depression and suicidal ideas. The patient is nervous/anxious.   All other systems reviewed and are negative.   Blood pressure (!) 141/89, pulse 86, temperature 98.5 F (36.9 C), temperature source Oral, resp. rate 20, height 4\' 11"  (1.499 m), weight 80.7 kg, SpO2 98 %.Body mass index is 35.95 kg/m.  General Appearance: Disheveled  Eye Contact:  Fair  Speech:  Normal Rate  Volume:  Normal  Mood:  Anxious and Depressed  Affect:  Congruent  Thought Process:  Coherent  Orientation:  Full (Time, Place, and Person)  Thought Content:  Rumination  Suicidal Thoughts:  Yes.  with intent/plan  Homicidal Thoughts:  No  Memory:  Immediate;   Fair Recent;   Fair Remote;   Fair  Judgement:  Poor  Insight:  Fair  Psychomotor Activity:  Decreased  Concentration:  Concentration: Fair and Attention Span: Fair  Recall:  of Knowledge:  Fair  Language:  Good  Akathisia:  No  Handed:  Right  AIMS (if indicated):     Assets:  Housing Leisure Time Physical Health Resilience Social Support  ADL's:  Intact  Cognition:  WNL  Sleep:        Physical Exam: Physical Exam Vitals and nursing note reviewed.  Constitutional:      Appearance: Normal appearance.  HENT:     Head: Normocephalic.     Nose: Nose normal.  Pulmonary:     Effort: Pulmonary effort is normal.  Musculoskeletal:        General:  Normal range of motion.     Cervical back: Normal range of motion.  Neurological:     General: No focal deficit present.     Mental Status: She is alert and oriented to person, place, and time.  Psychiatric:        Attention and Perception: Attention and perception normal.        Mood and Affect: Mood is anxious and depressed.        Speech: Speech normal.  Behavior: Behavior normal. Behavior is cooperative.        Thought Content: Thought content includes suicidal ideation. Thought content includes suicidal plan.        Cognition and Memory: Cognition and memory normal.        Judgment: Judgment normal.    Review of Systems  Psychiatric/Behavioral:  Positive for depression and suicidal ideas. The patient is nervous/anxious.   All other systems reviewed and are negative.  Blood pressure (!) 141/89, pulse 86, temperature 98.5 F (36.9 C), temperature source Oral, resp. rate 20, height 4\' 11"  (1.499 m), weight 80.7 kg, SpO2 98 %. Body mass index is 35.95 kg/m.  Treatment Plan Summary: Daily contact with patient to assess and evaluate symptoms and progress in treatment, Medication management, and Plan : Major depressive disorder, recurrent, severe without psychosis: Zyprexa 10 mg at bedtime  Nightmares: Prazosin 4 mg at bedtime  Insomnia: Trazodone 100 mg at bedtime  Anxiety: Hydroxyzine 50 mg PRN   Disposition: Recommend psychiatric Inpatient admission when medically cleared.  , NP 06/24/2022 12:07 PM

## 2022-06-24 NOTE — ED Notes (Signed)
Pt moved to  room 24 via w/c    Report called to [employer]

## 2022-06-24 NOTE — ED Notes (Signed)
Pt attempting to cut self with finger nails. This RN at bedside along with security holding patients hands to get patient to stop scratching self. Pt repeats "I need help I'm so anxious". IM medications ordered and administered by this RN. Pt took medications voluntarily.

## 2022-06-24 NOTE — ED Triage Notes (Signed)
Presents via EMS  States she woke up with a panic attack this am  Also having some chest pressure    States she has been seen for same the last 5 days

## 2022-06-24 NOTE — Progress Notes (Signed)
Admissions note: Pt was initially admitted to the ED due to chest pain and nausea. Pt stated having panic attacks for multiple days and waking up with them prior to admission, with no trigger identified. Pt stated because of that, she started to have SI thoughts. Pt stated that she was currently having SI thoughts but had no plan and contracted for safety. Pt states her only stressor is money. Pt does not have a job but is on disability. Pt seemed to be guarded as she was not elaborating any of her answers. Pt towards the end of the admission, started to dry heave and stated that she has been nauseous all day. There was report of speculation that it was for attention but the MD was notified and PRN was given. Pt denied eating dinner. Consents signed, handbook detailing the patient's rights, responsibilities, and visitor guidelines provided. Skin/belongings search completed and patient oriented to unit. Patient stable at this time. Patient given the opportunity to express concerns and ask questions. Patient given toiletries. RN will continue to monitor.   06/24/22 1600  Psych Admission Type (Psych Patients Only)  Admission Status Involuntary  Psychosocial Assessment  Patient Complaints Anxiety;Confusion;Crying spells;Depression;Hopelessness;Helplessness;Isolation;Loneliness;Nervousness;Panic attack;Sadness;Self-harm thoughts;Worrying;Worthlessness  Eye Contact Brief  Facial Expression Anxious;Sad;Worried  Affect Anxious;Depressed;Sad  Speech Logical/coherent;Soft  Interaction Minimal;Forwards little  Motor Activity Other (Comment) (Shaky)  Appearance/Hygiene In scrubs  Behavior Characteristics Cooperative;Appropriate to situation;Anxious  Mood Depressed;Anxious;Sad;Sullen  Aggressive Behavior  Effect No apparent injury  Thought Process  Coherency Circumstantial  Content Hypochondria  Delusions Somatic  Perception WDL  Hallucination None reported or observed  Judgment Limited  Confusion None   Danger to Self  Current suicidal ideation? Passive  Self-Injurious Behavior Some self-injurious ideation observed or expressed.  No lethal plan expressed   Agreement Not to Harm Self Yes  Description of Agreement verbal  Danger to Others  Danger to Others None reported or observed

## 2022-06-24 NOTE — ED Notes (Signed)
Pt standing at doorway  Diaphoretic  dry heaving  States she feels like she is going to pass out  encouraged to sit on stretcher
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
## 2022-06-24 NOTE — Plan of Care (Signed)
  Problem: Education: Goal: Knowledge of General Education information will improve Description: Including pain rating scale, medication(s)/side effects and non-pharmacologic comfort measures Outcome: Progressing   Problem: Coping: Goal: Level of anxiety will decrease Outcome: Not Progressing   Problem: Coping: Goal: Coping ability will improve Outcome: Not Progressing Goal: Will verbalize feelings Outcome: Not Progressing   Problem: Self-Concept: Goal: Level of anxiety will decrease Outcome: Not Progressing   Problem: Self-Concept: Goal: Level of anxiety will decrease Outcome: Not Progressing   Problem: Coping: Goal: Coping ability will improve Outcome: Not Progressing

## 2022-06-24 NOTE — BH Assessment (Addendum)
Patient is to be admitted to [facility] by Dr. Weber Cooks.  Attending Physician will be Dr.  Weber Cooks .   Patient has been assigned to room 316, by [facility] Charge Nurse Natale Milch.   Intake Paper Work has been signed and placed on patient chart.  ER staff is aware of the admission: [place], ER Secretary   Dr. Charna Archer, ER MD  Gwenette Greet, Patient's Nurse

## 2022-06-25 DIAGNOSIS — F332 Major depressive disorder, recurrent severe without psychotic features: Secondary | ICD-10-CM | POA: Diagnosis not present

## 2022-06-25 DIAGNOSIS — F410 Panic disorder [episodic paroxysmal anxiety] without agoraphobia: Secondary | ICD-10-CM | POA: Insufficient documentation

## 2022-06-25 MED ORDER — SERTRALINE HCL 25 MG PO TABS
150.0000 mg | ORAL_TABLET | Freq: Every day | ORAL | Status: DC
  Administered 2022-06-25 – 2022-07-02 (×8): 150 mg via ORAL
  Filled 2022-06-25 (×8): qty 2

## 2022-06-25 MED ORDER — BACITRACIN-NEOMYCIN-POLYMYXIN OINTMENT TUBE
TOPICAL_OINTMENT | Freq: Two times a day (BID) | CUTANEOUS | Status: DC
  Administered 2022-06-30: 1 via TOPICAL
  Filled 2022-06-25: qty 14.17

## 2022-06-25 NOTE — Progress Notes (Signed)
Patient alert and oriented x 4, she was noted anxious and restless, she stated " I am having a panic attackNurse, mental health assessed patient and VS revealed elevated blood pressure, pulse WNL, she was noted shaking. Writer stayed with patient, she was offered emotional support and encouragement. Patient was given medication for blood pressure along with her scheduled medication  bedtime regimen. 15 minutes safety checks maintained will continue to monitor.

## 2022-06-25 NOTE — BHH Counselor (Signed)
CSW attempted to meet with pt regarding completion of the assessment. However, CSW knocked and called pt's name from the door without response. Pt appeared to be in bed sleeping soundly and in no acute distress. CSW will attempt completion of the assessment at a later time.   Chalmers Guest. Guerry Bruin, MSW, LCSW, [place] 06/25/2022 2:23 PM

## 2022-06-25 NOTE — Group Note (Signed)
B[facility] LCSW Group Therapy Note    Group Date: 06/25/2022 Start Time: 1300 End Time: 1400  Type of Therapy and Topic:  Group Therapy:  Overcoming Obstacles  Participation Level:  [facility] PARTICIPATION LEVEL: Did Not Attend  Mood:  Description of Group:   In this group patients will be encouraged to explore what they see as obstacles to their own wellness and recovery. They will be guided to discuss their thoughts, feelings, and behaviors related to these obstacles. The group will process together ways to cope with barriers, with attention given to specific choices patients can make. Each patient will be challenged to identify changes they are motivated to make in order to overcome their obstacles. This group will be process-oriented, with patients participating in exploration of their own experiences as well as giving and receiving support and challenge from other group members.  Therapeutic Goals: 1. Patient will identify personal and current obstacles as they relate to admission. 2. Patient will identify barriers that currently interfere with their wellness or overcoming obstacles.  3. Patient will identify feelings, thought process and behaviors related to these barriers. 4. Patient will identify two changes they are willing to make to overcome these obstacles:    Summary of Patient Progress   X   Therapeutic Modalities:   Cognitive Behavioral Therapy Solution Focused Therapy Motivational Interviewing Relapse Prevention Therapy   Trevyn Lumpkin J Lauro Manlove, LCSW 

## 2022-06-25 NOTE — Progress Notes (Addendum)
Pt denies SI/HI/AVH and verbally agrees to approach staff if these become apparent or before harming themselves/others. Rates depression 5/10. Rates anxiety 5/10. Rates pain 3/10. Pt states that she has had a much better day today. Pt states that her nausea is better. Pt was sleeping in her room for most of the morning but has been out in the dayroom working on crossword puzzles. Pt has not been seen or heard dry heaving. Pt started to throw up in the evening. Pt was given PRN. Scheduled medications administered to pt, per MD orders. RN provided support and encouragement to pt. Q15 min safety checks implemented and continued. Pt safe on the unit. RN will continue to monitor and intervene as needed.  06/25/22 0754  Psych Admission Type (Psych Patients Only)  Admission Status Involuntary  Psychosocial Assessment  Patient Complaints Anxiety;Depression  Eye Contact Fair  Facial Expression Anxious;Worried;Flat  Affect Anxious;Apprehensive;Preoccupied  Speech Logical/coherent  Interaction Needy  Motor Activity Slow  Appearance/Hygiene In scrubs;Poor hygiene  Behavior Characteristics Cooperative;Anxious  Mood Depressed;Anxious  Aggressive Behavior  Effect No apparent injury  Thought Process  Coherency Circumstantial  Content Hypochondria;Preoccupation  Delusions Somatic  Perception WDL  Hallucination None reported or observed  Judgment Limited  Confusion None  Danger to Self  Current suicidal ideation? Denies  Danger to Others  Danger to Others None reported or observed

## 2022-06-25 NOTE — BH IP Treatment Plan (Signed)
Interdisciplinary Treatment and Diagnostic Plan Update  06/25/2022 Time of Session: 9:00AM Natalie Rose MRN: [id number]  Principal Diagnosis: Major depressive disorder, recurrent severe without psychotic features (HCC)  Secondary Diagnoses: Principal Problem:   Major depressive disorder, recurrent severe without psychotic features (HCC)   Current Medications:  Current Facility-Administered Medications  Medication Dose Route Frequency Provider Last Rate Last Admin   acetaminophen (TYLENOL) tablet 650 mg  650 mg Oral Q6H PRN Charm Rings, NP       alum & mag hydroxide-simeth (MAALOX/MYLANTA) 200-200-20 MG/5ML suspension 30 mL  30 mL Oral Q4H PRN Charm Rings, NP       cholecalciferol (VITAMIN D3) 25 MCG (1000 UNIT) tablet 5,000 Units  5,000 Units Oral QHS Charm Rings, NP   5,000 Units at 06/24/22 2200   cloNIDine (CATAPRES) tablet 0.1 mg  0.1 mg Oral Daily PRN Charm Rings, NP   0.1 mg at 06/25/22 0753   hydrOXYzine (ATARAX) tablet 50 mg  50 mg Oral TID PRN Charm Rings, NP   50 mg at 06/25/22 0804   magnesium hydroxide (MILK OF MAGNESIA) suspension 30 mL  30 mL Oral Daily PRN Charm Rings, NP       metFORMIN (GLUCOPHAGE) tablet 500 mg  500 mg Oral BID WC Charm Rings, NP   500 mg at 06/25/22 0753   mirtazapine (REMERON) tablet 15 mg  15 mg Oral QHS Charm Rings, NP   15 mg at 06/24/22 2201   neomycin-bacitracin-polymyxin (NEOSPORIN) ointment   Topical BID Clapacs, [name], MD       nicotine (NICODERM CQ - dosed in mg/24 hours) patch 14 mg  14 mg Transdermal Daily Clapacs, [name], MD   14 mg at 06/25/22 0757   OLANZapine (ZYPREXA) tablet 10 mg  10 mg Oral QHS Charm Rings, NP   10 mg at 06/24/22 2202   ondansetron (ZOFRAN-ODT) disintegrating tablet 4 mg  4 mg Oral Q8H PRN Clapacs, [name], MD   4 mg at 06/25/22 0754   prazosin (MINIPRESS) capsule 4 mg  4 mg Oral QHS Charm Rings, NP   4 mg at 06/24/22 2200   traZODone (DESYREL) tablet 100 mg  100 mg  Oral QHS Charm Rings, NP   100 mg at 06/24/22 2201   PTA Medications: Medications Prior to Admission  Medication Sig Dispense Refill Last Dose   aluminum-magnesium hydroxide-simethicone (MAALOX) 200-200-20 MG/5ML SUSP Take 30 mLs by mouth 4 (four) times daily -  before meals and at bedtime. 1680 mL 0    cholecalciferol (VITAMIN D3) 25 MCG (1000 UNIT) tablet Take 5,000 Units by mouth at bedtime.      COMBIVENT RESPIMAT 20-100 MCG/ACT AERS respimat 1 puff 4 (four) times daily.      dicyclomine (BENTYL) 10 MG capsule Take 1 capsule (10 mg total) by mouth in the morning and at bedtime for 14 days. 28 capsule 0    hydrOXYzine (ATARAX/VISTARIL) 50 MG tablet Take 50 mg by mouth 3 (three) times daily as needed.      loperamide (IMODIUM) 2 MG capsule Take 1 capsule (2 mg total) by mouth 4 (four) times daily as needed for diarrhea or loose stools. 12 capsule 0    LYBALVI 10-10 MG TABS Take 1 tablet by mouth daily. This is to replace Zyprexa 10 mg daily although she has not started taking yet      metFORMIN (GLUCOPHAGE) 500 MG tablet Take 1 tablet (500 mg total)  by mouth 2 (two) times daily with a meal. 60 tablet 0    mirtazapine (REMERON) 15 MG tablet Take 15 mg by mouth at bedtime.      naproxen (NAPROSYN) 500 MG tablet Take 500 mg by mouth daily as needed.      ondansetron (ZOFRAN ODT) 4 MG disintegrating tablet Take 1 tablet (4 mg total) by mouth every 8 (eight) hours as needed. 20 tablet 0    pantoprazole (PROTONIX) 40 MG tablet Take 1 tablet (40 mg total) by mouth daily. (Patient not taking: Reported on 01/29/2022) 30 tablet 0    prazosin (MINIPRESS) 2 MG capsule Take 4 mg by mouth at bedtime.      promethazine (PHENERGAN) 25 MG tablet Take 1 tablet (25 mg total) by mouth every 6 (six) hours as needed for nausea or vomiting. 30 tablet 0    SUMAtriptan (IMITREX) 50 MG tablet Take 50 mg by mouth every 2 (two) hours as needed.      SUMAtriptan (IMITREX) 50 MG tablet Take by mouth.      traZODone  (DESYREL) 100 MG tablet Take 100 mg by mouth at bedtime.       Patient Stressors: Financial difficulties    Patient Strengths: Psychologist, clinical for treatment/growth   Treatment Modalities: Medication Management, Group therapy, Case management,  1 to 1 session with clinician, Psychoeducation, Recreational therapy.   Physician Treatment Plan for Primary Diagnosis: Major depressive disorder, recurrent severe without psychotic features ([place]) Long Term Goal(s):     Short Term Goals:    Medication Management: Evaluate patient's response, side effects, and tolerance of medication regimen.  Therapeutic Interventions: 1 to 1 sessions, Unit Group sessions and Medication administration.  Evaluation of Outcomes: Not Met  Physician Treatment Plan for Secondary Diagnosis: Principal Problem:   Major depressive disorder, recurrent severe without psychotic features ([place])  Long Term Goal(s):     Short Term Goals:       Medication Management: Evaluate patient's response, side effects, and tolerance of medication regimen.  Therapeutic Interventions: 1 to 1 sessions, Unit Group sessions and Medication administration.  Evaluation of Outcomes: Not Met   RN Treatment Plan for Primary Diagnosis: Major depressive disorder, recurrent severe without psychotic features ([place]) Long Term Goal(s): Knowledge of disease and therapeutic regimen to maintain health will improve  Short Term Goals: Ability to remain free from injury will improve, Ability to verbalize frustration and anger appropriately will improve, Ability to demonstrate self-control, Ability to participate in decision making will improve, Ability to verbalize feelings will improve, Ability to disclose and discuss suicidal ideas, Ability to identify and develop effective coping behaviors will improve, and Compliance with prescribed medications will improve  Medication Management: RN will administer medications as ordered by  provider, will assess and evaluate patient's response and provide education to patient for prescribed medication. RN will report any adverse and/or side effects to prescribing provider.  Therapeutic Interventions: 1 on 1 counseling sessions, Psychoeducation, Medication administration, Evaluate responses to treatment, Monitor vital signs and CBGs as ordered, Perform/monitor CIWA, COWS, AIMS and Fall Risk screenings as ordered, Perform wound care treatments as ordered.  Evaluation of Outcomes: Not Met   LCSW Treatment Plan for Primary Diagnosis: Major depressive disorder, recurrent severe without psychotic features ([place]) Long Term Goal(s): Safe transition to appropriate next level of care at discharge, Engage patient in therapeutic group addressing interpersonal concerns.  Short Term Goals: Engage patient in aftercare planning with referrals and resources, Increase social support, Increase ability to  appropriately verbalize feelings, Increase emotional regulation, Facilitate acceptance of mental health diagnosis and concerns, and Increase skills for wellness and recovery  Therapeutic Interventions: Assess for all discharge needs, 1 to 1 time with Social worker, Explore available resources and support systems, Assess for adequacy in community support network, Educate family and significant other(s) on suicide prevention, Complete Psychosocial Assessment, Interpersonal group therapy.  Evaluation of Outcomes: Not Met   Progress in Treatment: Attending groups: No. Participating in groups: No. Taking medication as prescribed: Yes. Toleration medication: Yes. Family/Significant other contact made: No, will contact:  once permission is given. Patient understands diagnosis: Yes. Discussing patient identified problems/goals with staff: Yes. Medical problems stabilized or resolved: Yes. Denies suicidal/homicidal ideation: Yes. Issues/concerns per patient self-inventory: No. Other: none  New  problem(s) identified: No, Describe:  none  New Short Term/Long Term Goal(s): elimination of symptoms of psychosis, medication management for mood stabilization; elimination of SI thoughts; development of comprehensive mental wellness/sobriety plan.   Patient Goals:  "I want to work on my anxiety and depression and get them under control.  And to try and avoid self-harm."  Discharge Plan or Barriers:  CSW to assist in the development of appropriate discharge plans.   Reason for Continuation of Hospitalization: Anxiety Depression Medication stabilization Suicidal ideation  Estimated Length of Stay:  1-7 days   Last 3 [place] Suicide Severity Risk Score: Flowsheet Row Admission (Current) from 06/24/2022 in [place] Most recent reading at 06/24/2022  4:00 PM ED from 06/24/2022 in [facility] Emergency Department at [facility] Most recent reading at 06/24/2022  8:30 AM ED from 01/29/2022 in [facility] Urgent Care at [facility]  Most recent reading at 01/29/2022 12:10 PM  C-SSRS RISK CATEGORY High Risk No Risk No Risk       Last PHQ 2/9 Scores:     No data to display          Scribe for Treatment Team: Rozann Lesches, Marlinda Mike 06/25/2022 12:48 PM

## 2022-06-25 NOTE — H&P (Signed)
Psychiatric Admission Assessment Adult  Patient Identification: Natalie Rose MRN:  [id number] Date of Evaluation:  06/25/2022 Chief Complaint:  Major depressive disorder, recurrent severe without psychotic features (HCC) [F33.2] Principal Diagnosis: Major depressive disorder, recurrent severe without psychotic features (HCC) Diagnosis:  Principal Problem:   Major depressive disorder, recurrent severe without psychotic features (HCC) Active Problems:   PTSD (post-traumatic stress disorder)   Self-inflicted laceration of wrist (HCC)   Panic attack  History of Present Illness: Patient seen and chart reviewed.  42 year old woman with a history of chronic depression and anxiety with a diagnosis of PTSD.  Came to the emergency room yesterday reporting extreme worsening of anxiety and depression over several days.  In the emergency room patient scratched herself on the forearm reporting that she was having intrusive feelings of hurting herself.  Patient tells me that for the past 5 days she has been having panic attacks every day and feeling out of control.  She went to the [facility] emergency room 1 day and then came 2 hours the next day.  She says she has been compliant with all of her prescribed medicines with no recent changes.  She cannot identify any specific trigger of anything that has increased her stress in her life.  [facility] but not therapy.  Endorses cannabis use but says that it has been unchanged and denies other drug use.  Denies psychotic symptoms.  Currently denies any suicidal intent Associated Signs/Symptoms: Depression Symptoms:  depressed mood, fatigue, anxiety, panic attacks, (Hypo) Manic Symptoms:  Impulsivity, Anxiety Symptoms:  Panic Symptoms, Psychotic Symptoms:   Denies any PTSD Symptoms: Has a diagnosis of PTSD with chronic anxiety and depression Total Time spent with patient: 30 minutes  Past Psychiatric  History: Past history of suicide attempt chronic depression and chronic anxiety PTSD previous hospitalizations.  Multiple medicine trials.  Is the patient at risk to self? Yes.    Has the patient been a risk to self in the past 6 months? Yes.    Has the patient been a risk to self within the distant past? Yes.    Is the patient a risk to others? No.  Has the patient been a risk to others in the past 6 months? No.  Has the patient been a risk to others within the distant past? No.   [place] Scale:  Flowsheet Row Admission (Current) from 06/24/2022 in [facility] INPATIENT BEH[facility]AL MEDICINE Most recent reading at 06/24/2022  4:00 PM ED from 06/24/2022 in [facility] at [facility] Most recent reading at 06/24/2022  8:30 AM ED from 01/29/2022 in [facility] at [facility]  Most recent reading at 01/29/2022 12:10 PM  C-SSRS RISK CATEGORY High Risk No Risk No Risk        Prior Inpatient Therapy: Yes.   If yes, describe has had prior hospitalizations most recently she was here in 2020 Prior Outpatient Therapy: Yes.   If yes, describe current outpatient treatment with medicine management  Alcohol Screening: 1. How often do you have a drink containing alcohol?: Never 2. How many drinks containing alcohol do you have on a typical day when you are drinking?: 1 or 2 3. How often do you have six or more drinks on one occasion?: Never AUDIT-C Score: 0 4. How often during the last year have you found that you were not able to stop drinking once you had started?: Never 5. How often during the last year  have you failed to do what was normally expected from you because of drinking?: Never 6. How often during the last year have you needed a first drink in the morning to get yourself going after a heavy drinking session?: Never 7. How often during the last year have you had a feeling of guilt of remorse after drinking?: Never 8. How often during the last year have you been unable  to remember what happened the night before because you had been drinking?: Never 9. Have you or someone else been injured as a result of your drinking?: No 10. Has a relative or friend or a doctor or another health worker been concerned about your drinking or suggested you cut down?: No Alcohol Use Disorder Identification Test Final Score (AUDIT): 0 Substance Abuse History in the last 12 months:  No. Consequences of Substance Abuse: Negative Previous Psychotropic Medications: Yes  Psychological Evaluations: Yes  Past Medical History:  Past Medical History:  Diagnosis Date   Anxiety    Arthritis    Depression    Diabetes mellitus without complication ([place])    Mental health disorder    Migraines     Past Surgical History:  Procedure Laterality Date   MOUTH SURGERY     Family History:  Family History  Problem Relation Age of Onset   Osteopenia Mother    Heart failure Mother    Osteoarthritis Mother    Depression Mother    Diabetes Father    Family Psychiatric  History: Depression in the mother Tobacco Screening:  Social History   Tobacco Use  Smoking Status Every Day   Packs/day: 1.00   Types: Cigarettes  Smokeless Tobacco Never    BH Tobacco Counseling     Are you interested in Tobacco Cessation Medications?  Yes, implement Nicotene Replacement Protocol Counseled patient on smoking cessation:  Yes Reason Tobacco Screening Not Completed: No value filed.       Social History:  Social History   Substance and Sexual Activity  Alcohol Use Not Currently     Social History   Substance and Sexual Activity  Drug Use Yes   Frequency: 3.0 times per week   Types: Marijuana    Additional Social History:                           Allergies:   Allergies  Allergen Reactions   Diazepam Hives, Anxiety, Itching, Other (See Comments) and Rash    unknown unknown    Lurasidone    Lab Results:  Results for orders placed or performed during the hospital  encounter of 06/24/22 (from the past 48 hour(s))  CBG monitoring, ED     Status: Abnormal   Collection Time: 06/24/22  8:43 AM  Result Value Ref Range   Glucose-Capillary 179 (H) 70 - 99 mg/dL    Comment: Glucose reference range applies only to samples taken after fasting for at least 8 hours.  Comprehensive metabolic panel     Status: Abnormal   Collection Time: 06/24/22  8:50 AM  Result Value Ref Range   Sodium 139 135 - 145 mmol/L   Potassium 3.8 3.5 - 5.1 mmol/L   Chloride 106 98 - 111 mmol/L   CO2 22 22 - 32 mmol/L   Glucose, Bld 172 (H) 70 - 99 mg/dL    Comment: Glucose reference range applies only to samples taken after fasting for at least 8 hours.   BUN 15 6 - 20  mg/dL   Creatinine, Ser 0.91 0.44 - 1.00 mg/dL   Calcium 9.7 8.9 - 10.3 mg/dL   Total Protein 8.2 (H) 6.5 - 8.1 g/dL   Albumin 4.4 3.5 - 5.0 g/dL   AST 29 15 - 41 U/L   ALT 30 0 - 44 U/L   Alkaline Phosphatase 68 38 - 126 U/L   Total Bilirubin 0.5 0.3 - 1.2 mg/dL   GFR, Estimated >60 >60 mL/min    Comment: (NOTE) Calculated using the CKD-EPI Creatinine Equation (2021)    Anion gap 11 5 - 15    Comment: Performed at [facility], [address] [place], [place] [address]  CBC with Differential     Status: Abnormal   Collection Time: 06/24/22  8:50 AM  Result Value Ref Range   WBC 16.9 (H) 4.0 - 10.5 K/uL   RBC 4.73 3.87 - 5.11 MIL/uL   Hemoglobin 13.1 12.0 - 15.0 g/dL   HCT 39.2 36.0 - 46.0 %   MCV 82.9 80.0 - 100.0 fL   MCH 27.7 26.0 - 34.0 pg   MCHC 33.4 30.0 - 36.0 g/dL   RDW 14.7 11.5 - 15.5 %   Platelets 333 150 - 400 K/uL   nRBC 0.0 0.0 - 0.2 %   Neutrophils Relative % 78 %   Neutro Abs 13.2 (H) 1.7 - 7.7 K/uL   Lymphocytes Relative 16 %   Lymphs Abs 2.7 0.7 - 4.0 K/uL   Monocytes Relative 4 %   Monocytes Absolute 0.7 0.1 - 1.0 K/uL   Eosinophils Relative 1 %   Eosinophils Absolute 0.2 0.0 - 0.5 K/uL   Basophils Relative 0 %   Basophils Absolute 0.0 0.0 - 0.1 K/uL   Immature  Granulocytes 1 %   Abs Immature Granulocytes 0.09 (H) 0.00 - 0.07 K/uL    Comment: Performed at [facility], [address] [place], [place] [address]  Lipase, blood     Status: None   Collection Time: 06/24/22  8:50 AM  Result Value Ref Range   Lipase 37 11 - 51 U/L    Comment: Performed at [facility], [place], [place] [address]  Troponin I (High Sensitivity)     Status: None   Collection Time: 06/24/22  8:50 AM  Result Value Ref Range   Troponin I (High Sensitivity) 5 <18 ng/L    Comment: (NOTE) Elevated high sensitivity troponin I (hsTnI) values and significant  changes across serial measurements may suggest ACS but many other  chronic and acute conditions are known to elevate hsTnI results.  Refer to the "Links" section for chest pain algorithms and additional  guidance. Performed at [facility], [address] [place], [place] [address]   TSH     Status: None   Collection Time: 06/24/22  9:08 AM  Result Value Ref Range   TSH 2.816 0.350 - 4.500 uIU/mL    Comment: Performed by a 3rd Generation assay with a functional sensitivity of <=0.01 uIU/mL. Performed at [facility], [address] [place],  [address]   Acetaminophen level     Status: Abnormal   Collection Time: 06/24/22  9:08 AM  Result Value Ref Range   Acetaminophen (Tylenol), Serum <10 (L) 10 - 30 ug/mL    Comment: (NOTE) Therapeutic concentrations vary significantly. A range of 10-30 ug/mL  may be an effective concentration for many patients. However, some  are best treated at concentrations outside of this range. Acetaminophen concentrations >  150 ug/mL at 4 hours after ingestion  and >50 ug/mL at 12 hours after ingestion are often associated with  toxic reactions.  Performed at [facility], [address] [place], [place] [address]   Ethanol     Status: None   Collection Time: 06/24/22  9:08 AM  Result Value Ref Range    Alcohol, Ethyl (B) <10 <10 mg/dL    Comment: (NOTE) Lowest detectable limit for serum alcohol is 10 mg/dL.  For medical purposes only. Performed at [facility], [address] [place], [place] [address]   Urinalysis, Routine w reflex microscopic Urine, Clean Catch     Status: Abnormal   Collection Time: 06/24/22 11:41 AM  Result Value Ref Range   Color, Urine YELLOW (A) YELLOW   APPearance HAZY (A) CLEAR   Specific Gravity, Urine 1.012 1.005 - 1.030   pH 8.0 5.0 - 8.0   Glucose, UA NEGATIVE NEGATIVE mg/dL   Hgb urine dipstick NEGATIVE NEGATIVE   Bilirubin Urine NEGATIVE NEGATIVE   Ketones, ur 20 (A) NEGATIVE mg/dL   Protein, ur NEGATIVE NEGATIVE mg/dL   Nitrite NEGATIVE NEGATIVE   Leukocytes,Ua NEGATIVE NEGATIVE    Comment: Performed at [facility], [address] [place], [place] [address]  Urine Drug Screen, Qualitative     Status: Abnormal   Collection Time: 06/24/22 11:41 AM  Result Value Ref Range   Tricyclic, Ur Screen NONE DETECTED NONE DETECTED   Amphetamines, Ur Screen NONE DETECTED NONE DETECTED   MDMA (Ecstasy)Ur Screen NONE DETECTED NONE DETECTED   Cocaine Metabolite,Ur [place] NONE DETECTED NONE DETECTED   Opiate, Ur Screen NONE DETECTED NONE DETECTED   Phencyclidine (PCP) Ur S NONE DETECTED NONE DETECTED   Cannabinoid 50 Ng, Ur Comfort POSITIVE (A) NONE DETECTED   Barbiturates, Ur Screen NONE DETECTED NONE DETECTED   Benzodiazepine, Ur Scrn NONE DETECTED NONE DETECTED   Methadone Scn, Ur NONE DETECTED NONE DETECTED    Comment: (NOTE) Tricyclics + metabolites, urine    Cutoff 1000 ng/mL Amphetamines + metabolites, urine  Cutoff 1000 ng/mL MDMA (Ecstasy), urine              Cutoff 500 ng/mL Cocaine Metabolite, urine          Cutoff 300 ng/mL Opiate + metabolites, urine        Cutoff 300 ng/mL Phencyclidine (PCP), urine         Cutoff 25 ng/mL Cannabinoid, urine                 Cutoff 50 ng/mL Barbiturates + metabolites, urine  Cutoff 200  ng/mL Benzodiazepine, urine              Cutoff 200 ng/mL Methadone, urine                   Cutoff 300 ng/mL  The urine drug screen provides only a preliminary, unconfirmed analytical test result and should not be used for non-medical purposes. Clinical consideration and professional judgment should be applied to any positive drug screen result due to possible interfering substances. A more specific alternate chemical method must be used in order to obtain a confirmed analytical result. Gas chromatography / mass spectrometry (GC/MS) is the preferred confirm atory method. Performed at [facility], [address] [place], [place] [address]   POC urine preg, ED     Status: None   Collection Time: 06/24/22 12:18 PM  Result Value Ref Range   Preg Test, Ur NEGATIVE NEGATIVE  Comment:        THE SENSITIVITY OF THIS METHODOLOGY IS >24 mIU/mL   Resp panel by RT-PCR (RSV, Flu A&B, Covid) Anterior Nasal Swab     Status: None   Collection Time: 06/24/22 12:39 PM   Specimen: Anterior Nasal Swab  Result Value Ref Range   SARS Coronavirus 2 by RT PCR NEGATIVE NEGATIVE    Comment: (NOTE) SARS-CoV-2 target nucleic acids are NOT DETECTED.  The SARS-CoV-2 RNA is generally detectable in upper respiratory specimens during the acute phase of infection. The lowest concentration of SARS-CoV-2 viral copies this assay can detect is 138 copies/mL. A negative result does not preclude SARS-Cov-2 infection and should not be used as the sole basis for treatment or other patient management decisions. A negative result may occur with  improper specimen collection/handling, submission of specimen other than nasopharyngeal swab, presence of viral mutation(s) within the areas targeted by this assay, and inadequate number of viral copies(<138 copies/mL). A negative result must be combined with clinical observations, patient history, and epidemiological information. The expected result is  Negative.  Fact Sheet for Patients:  BloggerCourse.com  Fact Sheet for Healthcare Providers:  SeriousBroker.it  This test is no t yet approved or cleared by the [place] FDA and  has been authorized for detection and/or diagnosis of SARS-CoV-2 by FDA under an Emergency Use Authorization (EUA). This EUA will remain  in effect (meaning this test can be used) for the duration of the COVID-19 declaration under Section 564(b)(1) of the Act, 21 U.S.C.section 360bbb-3(b)(1), unless the authorization is terminated  or revoked sooner.       Influenza A by PCR NEGATIVE NEGATIVE   Influenza B by PCR NEGATIVE NEGATIVE    Comment: (NOTE) The Xpert Xpress SARS-CoV-2/FLU/RSV plus assay is intended as an aid in the diagnosis of influenza from Nasopharyngeal swab specimens and should not be used as a sole basis for treatment. Nasal washings and aspirates are unacceptable for Xpert Xpress SARS-CoV-2/FLU/RSV testing.  Fact Sheet for Patients: BloggerCourse.com  Fact Sheet for Healthcare Providers: SeriousBroker.it  This test is not yet approved or cleared by the [place] FDA and has been authorized for detection and/or diagnosis of SARS-CoV-2 by FDA under an Emergency Use Authorization (EUA). This EUA will remain in effect (meaning this test can be used) for the duration of the COVID-19 declaration under Section 564(b)(1) of the Act, 21 U.S.C. section 360bbb-3(b)(1), unless the authorization is terminated or revoked.     Resp Syncytial Virus by PCR NEGATIVE NEGATIVE    Comment: (NOTE) Fact Sheet for Patients: BloggerCourse.com  Fact Sheet for Healthcare Providers: SeriousBroker.it  This test is not yet approved or cleared by the [place] FDA and has been authorized for detection and/or diagnosis of SARS-CoV-2 by FDA under an  Emergency Use Authorization (EUA). This EUA will remain in effect (meaning this test can be used) for the duration of the COVID-19 declaration under Section 564(b)(1) of the Act, 21 U.S.C. section 360bbb-3(b)(1), unless the authorization is terminated or revoked.  Performed at [facility], [address] [place], [place] [address]     Blood Alcohol level:  Lab Results  Component Value Date   [facility] <10 06/24/2022   ETH <10 06/15/2018    Metabolic Disorder Labs:  Lab Results  Component Value Date   HGBA1C 7.3 (H) 12/16/2020   MPG 162.81 12/16/2020   No results found for: "PROLACTIN" Lab Results  Component Value Date   CHOL 234 (H) 12/16/2020   TRIG  338 (H) 12/16/2020   HDL 34 (L) 12/16/2020   CHOLHDL 6.9 12/16/2020   VLDL 68 (H) 12/16/2020   LDLCALC 132 (H) 12/16/2020    Current Medications: Current Facility-Administered Medications  Medication Dose Route Frequency Provider Last Rate Last Admin   acetaminophen (TYLENOL) tablet 650 mg  650 mg Oral Q6H PRN Charm RingsLord, Jamison Y, NP       alum & mag hydroxide-simeth (MAALOX/MYLANTA) 200-200-20 MG/5ML suspension 30 mL  30 mL Oral Q4H PRN Charm RingsLord, Jamison Y, NP       cholecalciferol (VITAMIN D3) 25 MCG (1000 UNIT) tablet 5,000 Units  5,000 Units Oral QHS Charm RingsLord, Jamison Y, NP   5,000 Units at 06/24/22 2200   cloNIDine (CATAPRES) tablet 0.1 mg  0.1 mg Oral Daily PRN Charm RingsLord, Jamison Y, NP   0.1 mg at 06/25/22 0753   hydrOXYzine (ATARAX) tablet 50 mg  50 mg Oral TID PRN Charm RingsLord, Jamison Y, NP   50 mg at 06/25/22 0804   magnesium hydroxide (MILK OF MAGNESIA) suspension 30 mL  30 mL Oral Daily PRN Charm RingsLord, Jamison Y, NP       metFORMIN (GLUCOPHAGE) tablet 500 mg  500 mg Oral BID WC Charm RingsLord, Jamison Y, NP   500 mg at 06/25/22 0753   mirtazapine (REMERON) tablet 15 mg  15 mg Oral QHS Charm RingsLord, Jamison Y, NP   15 mg at 06/24/22 2201   neomycin-bacitracin-polymyxin (NEOSPORIN) ointment   Topical BID [place], Jackquline DenmarkJohn T, MD       nicotine (NICODERM CQ -  dosed in mg/24 hours) patch 14 mg  14 mg Transdermal Daily Ignazio Kincaid, Jackquline DenmarkJohn T, MD   14 mg at 06/25/22 0757   OLANZapine (ZYPREXA) tablet 10 mg  10 mg Oral QHS Charm RingsLord, Jamison Y, NP   10 mg at 06/24/22 2202   ondansetron (ZOFRAN-ODT) disintegrating tablet 4 mg  4 mg Oral Q8H PRN Malay Fantroy, Jackquline DenmarkJohn T, MD   4 mg at 06/25/22 0754   prazosin (MINIPRESS) capsule 4 mg  4 mg Oral QHS Charm RingsLord, Jamison Y, NP   4 mg at 06/24/22 2200   sertraline (ZOLOFT) tablet 150 mg  150 mg Oral Daily Thamar Holik, Jackquline DenmarkJohn T, MD       traZODone (DESYREL) tablet 100 mg  100 mg Oral QHS Charm RingsLord, Jamison Y, NP   100 mg at 06/24/22 2201   PTA Medications: Medications Prior to Admission  Medication Sig Dispense Refill Last Dose   aluminum-magnesium hydroxide-simethicone (MAALOX) 200-200-20 MG/5ML SUSP Take 30 mLs by mouth 4 (four) times daily -  before meals and at bedtime. 1680 mL 0    cholecalciferol (VITAMIN D3) 25 MCG (1000 UNIT) tablet Take 5,000 Units by mouth at bedtime.      COMBIVENT RESPIMAT 20-100 MCG/ACT AERS respimat 1 puff 4 (four) times daily.      dicyclomine (BENTYL) 10 MG capsule Take 1 capsule (10 mg total) by mouth in the morning and at bedtime for 14 days. 28 capsule 0    hydrOXYzine (ATARAX/VISTARIL) 50 MG tablet Take 50 mg by mouth 3 (three) times daily as needed.      loperamide (IMODIUM) 2 MG capsule Take 1 capsule (2 mg total) by mouth 4 (four) times daily as needed for diarrhea or loose stools. 12 capsule 0    LYBALVI 10-10 MG TABS Take 1 tablet by mouth daily. This is to replace Zyprexa 10 mg daily although she has not started taking yet      metFORMIN (GLUCOPHAGE) 500 MG tablet Take 1 tablet (500 mg  total) by mouth 2 (two) times daily with a meal. 60 tablet 0    mirtazapine (REMERON) 15 MG tablet Take 15 mg by mouth at bedtime.      naproxen (NAPROSYN) 500 MG tablet Take 500 mg by mouth daily as needed.      ondansetron (ZOFRAN ODT) 4 MG disintegrating tablet Take 1 tablet (4 mg total) by mouth every 8 (eight) hours as  needed. 20 tablet 0    pantoprazole (PROTONIX) 40 MG tablet Take 1 tablet (40 mg total) by mouth daily. (Patient not taking: Reported on 01/29/2022) 30 tablet 0    prazosin (MINIPRESS) 2 MG capsule Take 4 mg by mouth at bedtime.      promethazine (PHENERGAN) 25 MG tablet Take 1 tablet (25 mg total) by mouth every 6 (six) hours as needed for nausea or vomiting. 30 tablet 0    SUMAtriptan (IMITREX) 50 MG tablet Take 50 mg by mouth every 2 (two) hours as needed.      SUMAtriptan (IMITREX) 50 MG tablet Take by mouth.      traZODone (DESYREL) 100 MG tablet Take 100 mg by mouth at bedtime.       Musculoskeletal: Strength & Muscle Tone: within normal limits Gait & Station: normal Patient leans: N/A            Psychiatric Specialty Exam:  Presentation  General Appearance: No data recorded Eye Contact:No data recorded Speech:No data recorded Speech Volume:No data recorded Handedness:No data recorded  Mood and Affect  Mood:No data recorded Affect:No data recorded  Thought Process  Thought Processes:No data recorded Duration of Psychotic Symptoms: Worse for the past week Past Diagnosis of Schizophrenia or Psychoactive disorder: No  Descriptions of Associations:No data recorded Orientation:No data recorded Thought Content:No data recorded Hallucinations:No data recorded Ideas of Reference:No data recorded Suicidal Thoughts:No data recorded Homicidal Thoughts:No data recorded  Sensorium  Memory:No data recorded Judgment:No data recorded Insight:No data recorded  Executive Functions  Concentration:No data recorded Attention Span:No data recorded Recall:No data recorded Fund of Knowledge:No data recorded Language:No data recorded  Psychomotor Activity  Psychomotor Activity:No data recorded  Assets  Assets:No data recorded  Sleep  Sleep:No data recorded   Physical Exam: Physical Exam Vitals and nursing note reviewed.  Constitutional:      Appearance: Normal  appearance.  HENT:     Head: Normocephalic and atraumatic.     Mouth/Throat:     Pharynx: Oropharynx is clear.  Eyes:     Pupils: Pupils are equal, round, and reactive to light.  Cardiovascular:     Rate and Rhythm: Normal rate and regular rhythm.  Pulmonary:     Effort: Pulmonary effort is normal.     Breath sounds: Normal breath sounds.  Abdominal:     General: Abdomen is flat.     Palpations: Abdomen is soft.  Musculoskeletal:        General: Normal range of motion.  Skin:    General: Skin is warm and dry.  Neurological:     General: No focal deficit present.     Mental Status: She is alert. Mental status is at baseline.  Psychiatric:        Mood and Affect: Mood normal.        Thought Content: Thought content normal.    Review of Systems  Constitutional: Negative.   HENT: Negative.    Eyes: Negative.   Respiratory: Negative.    Cardiovascular: Negative.   Gastrointestinal: Negative.   Musculoskeletal: Negative.   Skin: Negative.  Neurological: Negative.   Psychiatric/Behavioral:  Positive for depression. The patient is nervous/anxious.    Blood pressure (!) 140/93, pulse 85, temperature 98.2 F (36.8 C), temperature source Oral, resp. rate 20, height 4\' 11"  (1.499 m), weight 75.8 kg, SpO2 99 %. Body mass index is 33.73 kg/m.  Treatment Plan Summary: Daily contact with patient to assess and evaluate symptoms and progress in treatment, Medication management, and Plan increase mirtazapine to 30 mg at night.  Restart Zoloft 150 mg a day which was not on her medicine but which she says is a current medicine.  Involve in individual and group therapy.  Ongoing assessment of dangerousness prior to discharge.  Anticipated length of stay 2 to 3 days.  Observation Level/Precautions:  15 minute checks  Laboratory:  Chemistry Profile  Psychotherapy:    Medications:    Consultations:    Discharge Concerns:    Estimated LOS:  Other:     Physician Treatment Plan for  Primary Diagnosis: Major depressive disorder, recurrent severe without psychotic features (HCC) Long Term Goal(s): Improvement in symptoms so as ready for discharge  Short Term Goals: Ability to verbalize feelings will improve and Ability to disclose and discuss suicidal ideas  Physician Treatment Plan for Secondary Diagnosis: Principal Problem:   Major depressive disorder, recurrent severe without psychotic features (HCC) Active Problems:   PTSD (post-traumatic stress disorder)   Self-inflicted laceration of wrist (HCC)   Panic attack  Long Term Goal(s): Improvement in symptoms so as ready for discharge  Short Term Goals: Ability to identify and develop effective coping behaviors will improve and Ability to maintain clinical measurements within normal limits will improve  I certify that inpatient services furnished can reasonably be expected to improve the patient's condition.    , MD 1/22/20243:40 PM

## 2022-06-25 NOTE — Progress Notes (Signed)
Recreation Therapy Notes   Date: 06/25/2022  Time: 10:30 am     Location: Craft room   Behavioral response: Appropriate  Intervention Topic: Time-Management   Discussion/Intervention:  Group content today was focused on time management. The group defined time management and identified healthy ways to manage time. Individuals expressed how much of the 24 hours they use in a day. Patients expressed how much time they use just for themselves personally. The group expressed how they have managed their time in the past. Individuals participated in the intervention "Managing Life" where they had a chance to see how much of the 24 hours they use and where it goes. Clinical Observations/Feedback: Patient came to group and defined time management as keeping self busy.Participant explained that she manages her time by staying on a schedule. Individual left group early and did not return.  Mirai Greenwood LRT/CTRS         Skylynne Schlechter 06/25/2022 1:45 PM

## 2022-06-25 NOTE — Progress Notes (Signed)
Patient presents with flat affect. Voices several somatic complaints, headache, nausea. Frequently visits nurses station with different request. Continues to state she is having panic attacks and requesting meds for anxiety. Once during shift refused to contract for safety. WRiter kept patient in sight. Patient continued to ask if other people could sit with her . Patient stayed in nursing sight then verbally contracted for safety. Requested meds for sleep and pain. Given with good relief.  Encouragement and support provided. Safety checks maintained. Medications given as prescribed. Pt receptive and remains safe on unit with q 15 min checks.

## 2022-06-25 NOTE — [facility] Suicide Risk Assessment (Signed)
[facility] Admission Suicide Risk Assessment   Nursing information obtained from:    Demographic factors:  Caucasian, Living alone, Unemployed Current Mental Status:  Suicidal ideation indicated by patient, Self-harm thoughts, Self-harm behaviors Loss Factors:  Financial problems / change in socioeconomic status Historical Factors:  Prior suicide attempts Risk Reduction Factors:  NA  Total Time spent with patient: 45 minutes Principal Problem: Major depressive disorder, recurrent severe without psychotic features ([place]) Diagnosis:  Principal Problem:   Major depressive disorder, recurrent severe without psychotic features ([place]) Active Problems:   PTSD (post-traumatic stress disorder)   Self-inflicted laceration of wrist ([place])   Panic attack  Subjective Data: Patient seen and chart reviewed.  42 year old woman with history of depression and anxiety.  Yesterday while being seen in the emergency room she scratched herself on the forearm.  Said she was trying to hurt herself.  Patient reports that she has been having panic attacks daily for the last 5 days mood has been worse.  Currently she denies any thoughts of killing herself or wanting to die and denies psychosis  Continued Clinical Symptoms:  Alcohol Use Disorder Identification Test Final Score (AUDIT): 0 The "Alcohol Use Disorders Identification Test", Guidelines for Use in Primary Care, Second Edition.  World Pharmacologist [facility]). Score between 0-7:  no or low risk or alcohol related problems. Score between 8-15:  moderate risk of alcohol related problems. Score between 16-19:  high risk of alcohol related problems. Score 20 or above:  warrants further diagnostic evaluation for alcohol dependence and treatment.   CLINICAL FACTORS:   Severe Anxiety and/or Agitation Depression:   Impulsivity   Musculoskeletal: Strength & Muscle Tone: within normal limits Gait & Station: normal Patient leans: N/A  Psychiatric Specialty  Exam:  Presentation  General Appearance: No data recorded Eye Contact:No data recorded Speech:No data recorded Speech Volume:No data recorded Handedness:No data recorded  Mood and Affect  Mood:No data recorded Affect:No data recorded  Thought Process  Thought Processes:No data recorded Descriptions of Associations:No data recorded Orientation:No data recorded Thought Content:No data recorded History of Schizophrenia/Schizoaffective disorder:No  Duration of Psychotic Symptoms:No data recorded Hallucinations:No data recorded Ideas of Reference:No data recorded Suicidal Thoughts:No data recorded Homicidal Thoughts:No data recorded  Sensorium  Memory:No data recorded Judgment:No data recorded Insight:No data recorded  Executive Functions  Concentration:No data recorded Attention Span:No data recorded Recall:No data recorded Fund of Knowledge:No data recorded Language:No data recorded  Psychomotor Activity  Psychomotor Activity:No data recorded  Assets  Assets:No data recorded  Sleep  Sleep:No data recorded   Physical Exam: Physical Exam Vitals and nursing note reviewed.  Constitutional:      Appearance: Normal appearance.  HENT:     Head: Normocephalic and atraumatic.     Mouth/Throat:     Pharynx: Oropharynx is clear.  Eyes:     Pupils: Pupils are equal, round, and reactive to light.  Cardiovascular:     Rate and Rhythm: Normal rate and regular rhythm.  Pulmonary:     Effort: Pulmonary effort is normal.     Breath sounds: Normal breath sounds.  Abdominal:     General: Abdomen is flat.     Palpations: Abdomen is soft.  Musculoskeletal:        General: Normal range of motion.  Skin:    General: Skin is warm and dry.  Neurological:     General: No focal deficit present.     Mental Status: She is alert. Mental status is at baseline.  Psychiatric:  Attention and Perception: Attention normal.        Mood and Affect: Mood is anxious.         Speech: Speech normal.        Behavior: Behavior is cooperative.        Thought Content: Thought content normal.        Cognition and Memory: Cognition normal.    Review of Systems  Constitutional: Negative.   HENT: Negative.    Eyes: Negative.   Respiratory: Negative.    Cardiovascular: Negative.   Gastrointestinal: Negative.   Musculoskeletal: Negative.   Skin: Negative.   Neurological: Negative.   Psychiatric/Behavioral:  Positive for depression. Negative for suicidal ideas. The patient is nervous/anxious.    Blood pressure (!) 140/93, pulse 85, temperature 98.2 F (36.8 C), temperature source Oral, resp. rate 20, height 4\' 11"  (1.499 m), weight 75.8 kg, SpO2 99 %. Body mass index is 33.73 kg/m.   COGNITIVE FEATURES THAT CONTRIBUTE TO RISK:  Polarized thinking    SUICIDE RISK:   Minimal: No identifiable suicidal ideation.  Patients presenting with no risk factors but with morbid ruminations; may be classified as minimal risk based on the severity of the depressive symptoms  PLAN OF CARE: Continue 15-minute checks.  Review medicine.  Make small adjustments as needed and involve patient in individual and group therapy.  Ongoing assessment of dangerousness prior to discharge.  I certify that inpatient services furnished can reasonably be expected to improve the patient's condition.   Alethia Berthold, MD 06/25/2022, 3:38 PM

## 2022-06-25 NOTE — Plan of Care (Signed)
  Problem: Elimination: Goal: Will not experience complications related to bowel motility Outcome: Not Progressing   Problem: Safety: Goal: Ability to remain free from injury will improve Outcome: Not Progressing   Problem: Skin Integrity: Goal: Risk for impaired skin integrity will decrease Outcome: Progressing   Problem: Education: Goal: Knowledge of the prescribed therapeutic regimen will improve Outcome: Not Progressing   Problem: Coping: Goal: Coping ability will improve Outcome: Not Progressing Goal: Will verbalize feelings Outcome: Not Progressing

## 2022-06-26 DIAGNOSIS — F332 Major depressive disorder, recurrent severe without psychotic features: Secondary | ICD-10-CM | POA: Diagnosis not present

## 2022-06-26 MED ORDER — PROPRANOLOL HCL 20 MG PO TABS
10.0000 mg | ORAL_TABLET | Freq: Three times a day (TID) | ORAL | Status: DC
  Administered 2022-06-26 – 2022-07-02 (×18): 10 mg via ORAL
  Filled 2022-06-26 (×18): qty 1

## 2022-06-26 MED ORDER — PROMETHAZINE HCL 25 MG PO TABS
25.0000 mg | ORAL_TABLET | Freq: Four times a day (QID) | ORAL | Status: DC | PRN
  Administered 2022-06-26 – 2022-06-28 (×2): 25 mg via ORAL
  Filled 2022-06-26: qty 4
  Filled 2022-06-26 (×4): qty 1

## 2022-06-26 MED ORDER — LORAZEPAM 1 MG PO TABS
1.0000 mg | ORAL_TABLET | Freq: Four times a day (QID) | ORAL | Status: DC | PRN
  Administered 2022-06-26 (×2): 1 mg via ORAL
  Filled 2022-06-26 (×2): qty 1

## 2022-06-26 NOTE — Plan of Care (Signed)
D: Patient alert and oriented. Patient denies pain. Patient denies depression. Patient endorses anxiety. Patient denies SI/HI/AVH. Patient requested PRN zofran to help with nausea. Patient states that the zofran was effective.   A: Scheduled medications administered to patient, per MD orders.  Support and encouragement provided to patient.  Q15 minute safety checks maintained.   R: Patient compliant with medication administration and treatment plan. No adverse drug reactions noted. Patient remains safe on the unit at this time. Problem: Education: Goal: Knowledge of General Education information will improve Description: Including pain rating scale, medication(s)/side effects and non-pharmacologic comfort measures Outcome: Progressing   Problem: Education: Goal: Knowledge of the prescribed therapeutic regimen will improve Outcome: Progressing   Problem: Medication: Goal: Compliance with prescribed medication regimen will improve Outcome: Progressing

## 2022-06-26 NOTE — Progress Notes (Signed)
Recreation Therapy Notes    Date: 06/26/2022  Time: 9:50 am  Location: Courtyard     Behavioral response: N/A   Intervention Topic: Leisure   Discussion/Intervention: Patient refused to attend group.   Clinical Observations/Feedback:  Patient refused to attend group.    Natalie Rose LRT/CTRS        Natalie Rose 06/26/2022 12:33 PM 

## 2022-06-26 NOTE — Progress Notes (Signed)
[facility] MD Progress Note  06/26/2022 12:11 PM Natalie Rose  MRN:  [id number] Subjective: Follow-up patient with major depression.  Patient reports severe anxiety last night.  Says she was up much of the night feeling panicky also nauseated.  Took ondansetron without much effect.  Still very nervous today.  Talks about some of her stresses of worrying about her family's health but otherwise cannot put a clear finger on what is making her anxious.  No suicidal intent today and no active psychotic symptoms. Principal Problem: Major depressive disorder, recurrent severe without psychotic features ([facility]) Diagnosis: Principal Problem:   Major depressive disorder, recurrent severe without psychotic features ([facility]) Active Problems:   PTSD (post-traumatic stress disorder)   Self-inflicted laceration of wrist ([facility])   Panic attack  Total Time spent with patient: 30 minutes  Past Psychiatric History: Past psychiatric history of chronic anxiety PTSD depression  Past Medical History:  Past Medical History:  Diagnosis Date   Anxiety    Arthritis    Depression    Diabetes mellitus without complication ([facility])    Mental health disorder    Migraines     Past Surgical History:  Procedure Laterality Date   MOUTH SURGERY     Family History:  Family History  Problem Relation Age of Onset   Osteopenia Mother    Heart failure Mother    Osteoarthritis Mother    Depression Mother    Diabetes Father    Family Psychiatric  History: See previous Social History:  Social History   Substance and Sexual Activity  Alcohol Use Not Currently     Social History   Substance and Sexual Activity  Drug Use Yes   Frequency: 3.0 times per week   Types: Marijuana    Social History   Socioeconomic History   Marital status: Single    Spouse name: Not on file   Number of children: Not on file   Years of education: Not on file   Highest education level: Not on file  Occupational History   Not on  file  Tobacco Use   Smoking status: Every Day    Packs/day: 1.00    Types: Cigarettes   Smokeless tobacco: Never  Vaping Use   Vaping Use: Never used  Substance and Sexual Activity   Alcohol use: Not Currently   Drug use: Yes    Frequency: 3.0 times per week    Types: Marijuana   Sexual activity: Not Currently    Birth control/protection: None  Other Topics Concern   Not on file  Social History Narrative   Not on file   Social Determinants of Health   Financial Resource Strain: Not on file  Food Insecurity: Food Insecurity Present (06/24/2022)   Hunger Vital Sign    Worried About Running Out of Food in the Last Year: Sometimes true    Ran Out of Food in the Last Year: Often true  Transportation Needs: Unmet Transportation Needs (06/24/2022)   PRAPARE - Administrator, Civil Service (Medical): Yes    Lack of Transportation (Non-Medical): Yes  Physical Activity: Not on file  Stress: Not on file  Social Connections: Not on file   Additional Social History:                         Sleep: Fair  Appetite:  Fair  Current Medications: Current Facility-Administered Medications  Medication Dose Route Frequency Provider Last Rate Last Admin   acetaminophen (TYLENOL)  tablet 650 mg  650 mg Oral Q6H PRN Patrecia Pour, NP   650 mg at 06/25/22 2059   alum & mag hydroxide-simeth (MAALOX/MYLANTA) 200-200-20 MG/5ML suspension 30 mL  30 mL Oral Q4H PRN Patrecia Pour, NP       cholecalciferol (VITAMIN D3) 25 MCG (1000 UNIT) tablet 5,000 Units  5,000 Units Oral QHS Patrecia Pour, NP   5,000 Units at 06/24/22 2200   cloNIDine (CATAPRES) tablet 0.1 mg  0.1 mg Oral Daily PRN Patrecia Pour, NP   0.1 mg at 06/25/22 [id number]   hydrOXYzine (ATARAX) tablet 50 mg  50 mg Oral TID PRN Patrecia Pour, NP   50 mg at 06/26/22 0425   LORazepam (ATIVAN) tablet 1 mg  1 mg Oral Q6H PRN Octa Uplinger, Madie Reno, MD       magnesium hydroxide (MILK OF MAGNESIA) suspension 30 mL  30 mL Oral  Daily PRN Patrecia Pour, NP       metFORMIN (GLUCOPHAGE) tablet 500 mg  500 mg Oral BID WC Patrecia Pour, NP   500 mg at 06/26/22 0801   mirtazapine (REMERON) tablet 15 mg  15 mg Oral QHS Patrecia Pour, NP   15 mg at 06/25/22 2100   neomycin-bacitracin-polymyxin (NEOSPORIN) ointment   Topical BID Charmayne Odell, Madie Reno, MD   Given at 06/26/22 0801   nicotine (NICODERM CQ - dosed in mg/24 hours) patch 14 mg  14 mg Transdermal Daily Alorah Mcree, Madie Reno, MD   14 mg at 06/25/22 0757   OLANZapine (ZYPREXA) tablet 10 mg  10 mg Oral QHS Patrecia Pour, NP   10 mg at 06/25/22 2059   ondansetron (ZOFRAN-ODT) disintegrating tablet 4 mg  4 mg Oral Q8H PRN Madox Corkins, Madie Reno, MD   4 mg at 06/26/22 0425   prazosin (MINIPRESS) capsule 4 mg  4 mg Oral QHS Patrecia Pour, NP   4 mg at 06/25/22 2059   promethazine (PHENERGAN) tablet 25 mg  25 mg Oral Q6H PRN Drelyn Pistilli, Madie Reno, MD       propranolol (INDERAL) tablet 10 mg  10 mg Oral TID Brittiny Levitz, Madie Reno, MD       sertraline (ZOLOFT) tablet 150 mg  150 mg Oral Daily Jessica Seidman, Madie Reno, MD   150 mg at 06/26/22 0801   traZODone (DESYREL) tablet 100 mg  100 mg Oral QHS Patrecia Pour, NP   100 mg at 06/25/22 2059    Lab Results:  Results for orders placed or performed during the hospital encounter of 06/24/22 (from the past 48 hour(s))  POC urine preg, ED     Status: None   Collection Time: 06/24/22 12:18 PM  Result Value Ref Range   Preg Test, Ur NEGATIVE NEGATIVE    Comment:        THE SENSITIVITY OF THIS METHODOLOGY IS >24 mIU/mL   Resp panel by RT-PCR (RSV, Flu A&B, Covid) Anterior Nasal Swab     Status: None   Collection Time: 06/24/22 12:39 PM   Specimen: Anterior Nasal Swab  Result Value Ref Range   SARS Coronavirus 2 by RT PCR NEGATIVE NEGATIVE    Comment: (NOTE) SARS-CoV-2 target nucleic acids are NOT DETECTED.  The SARS-CoV-2 RNA is generally detectable in upper respiratory specimens during the acute phase of infection. The lowest concentration of  SARS-CoV-2 viral copies this assay can detect is 138 copies/mL. A negative result does not preclude SARS-Cov-2 infection and should not be used as the sole  basis for treatment or other patient management decisions. A negative result may occur with  improper specimen collection/handling, submission of specimen other than nasopharyngeal swab, presence of viral mutation(s) within the areas targeted by this assay, and inadequate number of viral copies(<138 copies/mL). A negative result must be combined with clinical observations, patient history, and epidemiological information. The expected result is Negative.  Fact Sheet for Patients:  EntrepreneurPulse.com.au  Fact Sheet for Healthcare Providers:  IncredibleEmployment.be  This test is no t yet approved or cleared by the [place] FDA and  has been authorized for detection and/or diagnosis of SARS-CoV-2 by FDA under an Emergency Use Authorization (EUA). This EUA will remain  in effect (meaning this test can be used) for the duration of the COVID-19 declaration under Section 564(b)(1) of the Act, 21 U.S.C.section 360bbb-3(b)(1), unless the authorization is terminated  or revoked sooner.       Influenza A by PCR NEGATIVE NEGATIVE   Influenza B by PCR NEGATIVE NEGATIVE    Comment: (NOTE) The Xpert Xpress SARS-CoV-2/FLU/RSV plus assay is intended as an aid in the diagnosis of influenza from Nasopharyngeal swab specimens and should not be used as a sole basis for treatment. Nasal washings and aspirates are unacceptable for Xpert Xpress SARS-CoV-2/FLU/RSV testing.  Fact Sheet for Patients: EntrepreneurPulse.com.au  Fact Sheet for Healthcare Providers: IncredibleEmployment.be  This test is not yet approved or cleared by the [place] FDA and has been authorized for detection and/or diagnosis of SARS-CoV-2 by FDA under an Emergency Use Authorization (EUA).  This EUA will remain in effect (meaning this test can be used) for the duration of the COVID-19 declaration under Section 564(b)(1) of the Act, 21 U.S.C. section 360bbb-3(b)(1), unless the authorization is terminated or revoked.     Resp Syncytial Virus by PCR NEGATIVE NEGATIVE    Comment: (NOTE) Fact Sheet for Patients: EntrepreneurPulse.com.au  Fact Sheet for Healthcare Providers: IncredibleEmployment.be  This test is not yet approved or cleared by the [place] FDA and has been authorized for detection and/or diagnosis of SARS-CoV-2 by FDA under an Emergency Use Authorization (EUA). This EUA will remain in effect (meaning this test can be used) for the duration of the COVID-19 declaration under Section 564(b)(1) of the Act, 21 U.S.C. section 360bbb-3(b)(1), unless the authorization is terminated or revoked.  Performed at [facility], [address] [place], [place] [address]     Blood Alcohol level:  Lab Results  Component Value Date   [facility] <10 06/24/2022   ETH <10 0000000    Metabolic Disorder Labs: Lab Results  Component Value Date   HGBA1C 7.3 (H) 12/16/2020   MPG 162.81 12/16/2020   No results found for: "PROLACTIN" Lab Results  Component Value Date   CHOL 234 (H) 12/16/2020   TRIG 338 (H) 12/16/2020   HDL 34 (L) 12/16/2020   CHOLHDL 6.9 12/16/2020   VLDL 68 (H) 12/16/2020   LDLCALC 132 (H) 12/16/2020    Physical Findings: AIMS: Facial and Oral Movements Muscles of Facial Expression: None, normal Lips and Perioral Area: None, normal Jaw: None, normal Tongue: None, normal,Extremity Movements Upper (arms, wrists, hands, fingers): None, normal Lower (legs, knees, ankles, toes): None, normal, Trunk Movements Neck, shoulders, hips: None, normal, Overall Severity Severity of abnormal movements (highest score from questions above): None, normal Incapacitation due to abnormal movements: None,  normal Patient's awareness of abnormal movements (rate only patient's report): No Awareness, Dental Status Current problems with teeth and/or dentures?: No Does patient usually wear dentures?: No  CIWA:    COWS:     Musculoskeletal: Strength & Muscle Tone: within normal limits Gait & Station: normal Patient leans: N/A  Psychiatric Specialty Exam:  Presentation  General Appearance: No data recorded Eye Contact:No data recorded Speech:No data recorded Speech Volume:No data recorded Handedness:No data recorded  Mood and Affect  Mood:No data recorded Affect:No data recorded  Thought Process  Thought Processes:No data recorded Descriptions of Associations:No data recorded Orientation:No data recorded Thought Content:No data recorded History of Schizophrenia/Schizoaffective disorder:No  Duration of Psychotic Symptoms:No data recorded Hallucinations:No data recorded Ideas of Reference:No data recorded Suicidal Thoughts:No data recorded Homicidal Thoughts:No data recorded  Sensorium  Memory:No data recorded Judgment:No data recorded Insight:No data recorded  Executive Functions  Concentration:No data recorded Attention Span:No data recorded Recall:No data recorded Fund of Knowledge:No data recorded Language:No data recorded  Psychomotor Activity  Psychomotor Activity:No data recorded  Assets  Assets:No data recorded  Sleep  Sleep:No data recorded   Physical Exam: Physical Exam Vitals and nursing note reviewed.  Constitutional:      Appearance: Normal appearance.  HENT:     Head: Normocephalic and atraumatic.     Mouth/Throat:     Pharynx: Oropharynx is clear.  Eyes:     Pupils: Pupils are equal, round, and reactive to light.  Cardiovascular:     Rate and Rhythm: Normal rate and regular rhythm.  Pulmonary:     Effort: Pulmonary effort is normal.     Breath sounds: Normal breath sounds.  Abdominal:     General: Abdomen is flat.     Palpations:  Abdomen is soft.  Musculoskeletal:        General: Normal range of motion.  Skin:    General: Skin is warm and dry.  Neurological:     General: No focal deficit present.     Mental Status: She is alert. Mental status is at baseline.  Psychiatric:        Attention and Perception: Attention normal.        Mood and Affect: Mood is anxious.        Speech: Speech is delayed.        Behavior: Behavior is slowed.        Thought Content: Thought content normal. Thought content does not include suicidal ideation.        Cognition and Memory: Cognition is impaired.    Review of Systems  Constitutional: Negative.   HENT: Negative.    Eyes: Negative.   Respiratory: Negative.    Cardiovascular: Negative.   Gastrointestinal:  Positive for nausea.  Musculoskeletal: Negative.   Skin: Negative.   Neurological: Negative.   Psychiatric/Behavioral:  The patient is nervous/anxious.    Blood pressure 98/66, pulse 65, temperature 98.9 F (37.2 C), temperature source Oral, resp. rate 20, height 4\' 11"  (1.499 m), weight 75.8 kg, SpO2 97 %. Body mass index is 33.73 kg/m.   Treatment Plan Summary: Medication management and Plan patient seen for follow-up.  Remains very anxious.  She does have a history of using cannabis but I am not clear that she has other substance abuse issues.  Discussed the use of benzodiazepines.  I will order Ativan 1 mg every 6 hours as needed for anxiety and also Phenergan although reminding her that generally ondansetron is considered a superior medication for the nausea.  Patient's outpatient physician had wanted to add propranolol so we will start 10 mg 3 times a day.  Additionally patient asks about the outpatient medicine that she had been  taking.  I reviewed this medicine and it is olanzapine plus a opiate blocker which is only meant to try and reduce weight gain so there should be no clinical difference between it.  Encourage group attendance  Alethia Berthold, MD 06/26/2022,  12:11 PM

## 2022-06-26 NOTE — [facility] Suicide Risk Assessment (Signed)
[place] INPATIENT:  Family/Significant Other Suicide Prevention Education  Suicide Prevention Education:  Patient Refusal for Family/Significant Other Suicide Prevention Education: The patient Natalie Rose has refused to provide written consent for family/significant other to be provided Family/Significant Other Suicide Prevention Education during admission and/or prior to discharge.  Physician notified.  SPE completed with pt, as pt refused to consent to family contact. SPI pamphlet provided to pt and pt was encouraged to share information with support network, ask questions, and talk about any concerns relating to SPE. Pt denies access to guns/firearms and verbalized understanding of information provided. Mobile Crisis information also provided to pt.  Shirl Harris 06/26/2022, 10:28 AM
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
## 2022-06-26 NOTE — Group Note (Signed)
[facility] LCSW Group Therapy Note   Group Date: 06/26/2022 Start Time: 1300 End Time: 1400  Type of Therapy/Topic:  Group Therapy:  Feelings about Diagnosis  Participation Level:  Did Not Attend    Description of Group:    This group will allow patients to explore their thoughts and feelings about diagnoses they have received. Patients will be guided to explore their level of understanding and acceptance of these diagnoses. Facilitator will encourage patients to process their thoughts and feelings about the reactions of others to their diagnosis, and will guide patients in identifying ways to discuss their diagnosis with significant others in their lives. This group will be process-oriented, with patients participating in exploration of their own experiences as well as giving and receiving support and challenge from other group members.   Therapeutic Goals: 1. Patient will demonstrate understanding of diagnosis as evidence by identifying two or more symptoms of the disorder:  2. Patient will be able to express two feelings regarding the diagnosis 3. Patient will demonstrate ability to communicate their needs through discussion and/or role plays  Summary of Patient Progress: X   Therapeutic Modalities:   Cognitive Behavioral Therapy Brief Therapy Feelings Identification    Treysen Sudbeck R Karmina Zufall, LCSW 

## 2022-06-26 NOTE — Progress Notes (Signed)
Patient continues to dry heave until small amounts of vomit come up. Patient states blood is in vomit. WRiter assess patient has small pink colored emesis in comode

## 2022-06-26 NOTE — Plan of Care (Signed)
Pt presents with anxious affect, denies SI HI AVH, seeking PRN medication for anxiety and nausea at PM medication pass.  Pt is generally cooperative but isolates to room much of the time.  Continued monitoring for safety via q 15 minute checks.

## 2022-06-27 DIAGNOSIS — F332 Major depressive disorder, recurrent severe without psychotic features: Secondary | ICD-10-CM | POA: Diagnosis not present

## 2022-06-27 MED ORDER — LORAZEPAM 0.5 MG PO TABS
0.5000 mg | ORAL_TABLET | Freq: Four times a day (QID) | ORAL | Status: DC | PRN
  Administered 2022-06-27 – 2022-07-02 (×7): 0.5 mg via ORAL
  Filled 2022-06-27 (×7): qty 1

## 2022-06-27 NOTE — Group Note (Signed)
[facility] LCSW Group Therapy Note   Group Date: 06/27/2022 Start Time: 1300 End Time: 1400   Type of Therapy/Topic:  Group Therapy:  Emotion Regulation  Participation Level:  Did Not Attend   Mood:  Description of Group:    The purpose of this group is to assist patients in learning to regulate negative emotions and experience positive emotions. Patients will be guided to discuss ways in which they have been vulnerable to their negative emotions. These vulnerabilities will be juxtaposed with experiences of positive emotions or situations, and patients challenged to use positive emotions to combat negative ones. Special emphasis will be placed on coping with negative emotions in conflict situations, and patients will process healthy conflict resolution skills.  Therapeutic Goals: Patient will identify two positive emotions or experiences to reflect on in order to balance out negative emotions:  Patient will label two or more emotions that they find the most difficult to experience:  Patient will be able to demonstrate positive conflict resolution skills through discussion or role plays:   Summary of Patient Progress: Patient did not attend group despite encouraged participation.   Therapeutic Modalities:   Cognitive Behavioral Therapy Feelings Identification Dialectical Behavioral Therapy   Katye Valek W Nailani Full, LCSWA 

## 2022-06-27 NOTE — Progress Notes (Signed)
Recreation Therapy Notes  Date: 06/27/2022   Time: 10:30 am   Location: Courtyard      Behavioral response: N/A   Intervention Topic: Values    Discussion/Intervention: Patient refused to attend group.    Clinical Observations/Feedback:  Patient refused to attend group.    Aric Jost LRT/CTRS          Natalie Rose 06/27/2022 11:04 AM 

## 2022-06-27 NOTE — Plan of Care (Signed)
D: Patient alert and oriented. Patient denies pain. Patient denies anxiety and depression. Patient denies SI/HI/AVH. Patient isolative to room during shift with exceptions to coming out for meals and medication.  Patient requested PRN Zofran. Patient states that the Zofran was effective once reassessed.  Patient observed on the unit more making phone calls and in the dayroom after dinner.  A: Scheduled medications administered to patient, per MD orders.  Support and encouragement provided to patient.  Q15 minute safety checks maintained.   R: Patient compliant with medication administration and treatment plan. No adverse drug reactions noted. Patient remains safe on the unit at this time. Problem: Education: Goal: Knowledge of General Education information will improve Description: Including pain rating scale, medication(s)/side effects and non-pharmacologic comfort measures Outcome: Progressing   Problem: Clinical Measurements: Goal: Ability to maintain clinical measurements within normal limits will improve Outcome: Progressing   Problem: Coping: Goal: Level of anxiety will decrease Outcome: Progressing   Problem: Health Behavior/Discharge Planning: Goal: Compliance with therapeutic regimen will improve Outcome: Progressing   Problem: Self-Concept: Goal: Level of anxiety will decrease Outcome: Progressing

## 2022-06-27 NOTE — Progress Notes (Signed)
[facility] MD Progress Note  06/27/2022 2:18 PM Natalie Rose  MRN:  [id number] Subjective: Patient seen and chart reviewed.  Patient reports that she is feeling better today.  Less anxious.  Less panicky.  She was in bed during the mid day and says that the medicine makes her feel a little bit sleepy.  No psychotic symptoms noted.  Denies any suicidal thought. Principal Problem: Major depressive disorder, recurrent severe without psychotic features ([place]) Diagnosis: Principal Problem:   Major depressive disorder, recurrent severe without psychotic features ([place]) Active Problems:   PTSD (post-traumatic stress disorder)   Self-inflicted laceration of wrist (HCC)   Panic attack  Total Time spent with patient: 30 minutes  Past Psychiatric History: Past history of chronic anxiety and depression and PTSD  Past Medical History:  Past Medical History:  Diagnosis Date   Anxiety    Arthritis    Depression    Diabetes mellitus without complication ([place])    Mental health disorder    Migraines     Past Surgical History:  Procedure Laterality Date   MOUTH SURGERY     Family History:  Family History  Problem Relation Age of Onset   Osteopenia Mother    Heart failure Mother    Osteoarthritis Mother    Depression Mother    Diabetes Father    Family Psychiatric  History: See previous Social History:  Social History   Substance and Sexual Activity  Alcohol Use Not Currently     Social History   Substance and Sexual Activity  Drug Use Yes   Frequency: 3.0 times per week   Types: Marijuana    Social History   Socioeconomic History   Marital status: Single    Spouse name: Not on file   Number of children: Not on file   Years of education: Not on file   Highest education level: Not on file  Occupational History   Not on file  Tobacco Use   Smoking status: Every Day    Packs/day: 1.00    Types: Cigarettes   Smokeless tobacco: Never  Vaping Use   Vaping Use: Never  used  Substance and Sexual Activity   Alcohol use: Not Currently   Drug use: Yes    Frequency: 3.0 times per week    Types: Marijuana   Sexual activity: Not Currently    Birth control/protection: None  Other Topics Concern   Not on file  Social History Narrative   Not on file   Social Determinants of Health   Financial Resource Strain: Not on file  Food Insecurity: Food Insecurity Present (06/24/2022)   Hunger Vital Sign    Worried About Running Out of Food in the Last Year: Sometimes true    Ran Out of Food in the Last Year: Often true  Transportation Needs: Unmet Transportation Needs (06/24/2022)   PRAPARE - Hydrologist (Medical): Yes    Lack of Transportation (Non-Medical): Yes  Physical Activity: Not on file  Stress: Not on file  Social Connections: Not on file   Additional Social History:                         Sleep: Fair  Appetite:  Fair  Current Medications: Current Facility-Administered Medications  Medication Dose Route Frequency Provider Last Rate Last Admin   acetaminophen (TYLENOL) tablet 650 mg  650 mg Oral Q6H PRN Patrecia Pour, NP   650 mg at 06/26/22 1806  alum & mag hydroxide-simeth (MAALOX/MYLANTA) 200-200-20 MG/5ML suspension 30 mL  30 mL Oral Q4H PRN Charm Rings, NP       cholecalciferol (VITAMIN D3) 25 MCG (1000 UNIT) tablet 5,000 Units  5,000 Units Oral QHS Charm Rings, NP   5,000 Units at 06/26/22 2158   cloNIDine (CATAPRES) tablet 0.1 mg  0.1 mg Oral Daily PRN Charm Rings, NP   0.1 mg at 06/25/22 0753   hydrOXYzine (ATARAX) tablet 50 mg  50 mg Oral TID PRN Charm Rings, NP   50 mg at 06/26/22 0425   LORazepam (ATIVAN) tablet 1 mg  1 mg Oral Q6H PRN Phyllicia Dudek T, MD   1 mg at 06/26/22 2203   magnesium hydroxide (MILK OF MAGNESIA) suspension 30 mL  30 mL Oral Daily PRN Charm Rings, NP       metFORMIN (GLUCOPHAGE) tablet 500 mg  500 mg Oral BID WC Charm Rings, NP   500 mg at 06/27/22  0920   mirtazapine (REMERON) tablet 15 mg  15 mg Oral QHS Charm Rings, NP   15 mg at 06/26/22 2158   neomycin-bacitracin-polymyxin (NEOSPORIN) ointment   Topical BID Lavaeh Bau, [name], MD   Given at 06/27/22 0919   nicotine (NICODERM CQ - dosed in mg/24 hours) patch 14 mg  14 mg Transdermal Daily Natesha Hassey, [name], MD   14 mg at 06/25/22 0757   OLANZapine (ZYPREXA) tablet 10 mg  10 mg Oral QHS Charm Rings, NP   10 mg at 06/26/22 2158   ondansetron (ZOFRAN-ODT) disintegrating tablet 4 mg  4 mg Oral Q8H PRN Namira Rosekrans, [name], MD   4 mg at 06/27/22 1206   prazosin (MINIPRESS) capsule 4 mg  4 mg Oral QHS Charm Rings, NP   4 mg at 06/26/22 2158   promethazine (PHENERGAN) tablet 25 mg  25 mg Oral Q6H PRN Dariah Mcsorley, [name], MD   25 mg at 06/26/22 1719   propranolol (INDERAL) tablet 10 mg  10 mg Oral TID Jaymere Alen, [name], MD   10 mg at 06/27/22 1206   sertraline (ZOLOFT) tablet 150 mg  150 mg Oral Daily Lianny Molter, [name], MD   150 mg at 06/27/22 0919   traZODone (DESYREL) tablet 100 mg  100 mg Oral QHS Charm Rings, NP   100 mg at 06/26/22 2158    Lab Results: No results found for this or any previous visit (from the past 48 hour(s)).  Blood Alcohol level:  Lab Results  Component Value Date   ETH <10 06/24/2022   ETH <10 06/15/2018    Metabolic Disorder Labs: Lab Results  Component Value Date   HGBA1C 7.3 (H) 12/16/2020   MPG 162.81 12/16/2020   No results found for: "PROLACTIN" Lab Results  Component Value Date   CHOL 234 (H) 12/16/2020   TRIG 338 (H) 12/16/2020   HDL 34 (L) 12/16/2020   CHOLHDL 6.9 12/16/2020   VLDL 68 (H) 12/16/2020   LDLCALC 132 (H) 12/16/2020    Physical Findings: AIMS: Facial and Oral Movements Muscles of Facial Expression: None, normal Lips and Perioral Area: None, normal Jaw: None, normal Tongue: None, normal,Extremity Movements Upper (arms, wrists, hands, fingers): None, normal Lower (legs, knees, ankles, toes): None, normal, Trunk Movements Neck,  shoulders, hips: None, normal, Overall Severity Severity of abnormal movements (highest score from questions above): None, normal Incapacitation due to abnormal movements: None, normal Patient's awareness of abnormal movements (rate only patient's report): No  Awareness, Dental Status Current problems with teeth and/or dentures?: No Does patient usually wear dentures?: No  CIWA:    COWS:     Musculoskeletal: Strength & Muscle Tone: within normal limits Gait & Station: normal Patient leans: N/A  Psychiatric Specialty Exam:  Presentation  General Appearance: No data recorded Eye Contact:No data recorded Speech:No data recorded Speech Volume:No data recorded Handedness:No data recorded  Mood and Affect  Mood:No data recorded Affect:No data recorded  Thought Process  Thought Processes:No data recorded Descriptions of Associations:No data recorded Orientation:No data recorded Thought Content:No data recorded History of Schizophrenia/Schizoaffective disorder:No  Duration of Psychotic Symptoms:No data recorded Hallucinations:No data recorded Ideas of Reference:No data recorded Suicidal Thoughts:No data recorded Homicidal Thoughts:No data recorded  Sensorium  Memory:No data recorded Judgment:No data recorded Insight:No data recorded  Executive Functions  Concentration:No data recorded Attention Span:No data recorded Recall:No data recorded Fund of Knowledge:No data recorded Language:No data recorded  Psychomotor Activity  Psychomotor Activity:No data recorded  Assets  Assets:No data recorded  Sleep  Sleep:No data recorded   Physical Exam: Physical Exam Vitals and nursing note reviewed.  Constitutional:      Appearance: Normal appearance.  HENT:     Head: Normocephalic and atraumatic.     Mouth/Throat:     Pharynx: Oropharynx is clear.  Eyes:     Pupils: Pupils are equal, round, and reactive to light.  Cardiovascular:     Rate and Rhythm: Normal rate  and regular rhythm.  Pulmonary:     Effort: Pulmonary effort is normal.     Breath sounds: Normal breath sounds.  Abdominal:     General: Abdomen is flat.     Palpations: Abdomen is soft.  Musculoskeletal:        General: Normal range of motion.  Skin:    General: Skin is warm and dry.  Neurological:     General: No focal deficit present.     Mental Status: She is alert. Mental status is at baseline.  Psychiatric:        Attention and Perception: Attention normal.        Mood and Affect: Mood normal. Affect is blunt.        Speech: Speech normal.        Behavior: Behavior is cooperative.        Thought Content: Thought content normal.        Cognition and Memory: Cognition normal.    Review of Systems  Constitutional: Negative.   HENT: Negative.    Eyes: Negative.   Respiratory: Negative.    Cardiovascular: Negative.   Gastrointestinal: Negative.   Musculoskeletal: Negative.   Skin: Negative.   Neurological: Negative.   Psychiatric/Behavioral: Negative.     Blood pressure (!) 131/96, pulse 72, temperature 98.5 F (36.9 C), temperature source Oral, resp. rate 20, height 4\' 11"  (1.499 m), weight 75.8 kg, SpO2 96 %. Body mass index is 33.73 kg/m.   Treatment Plan Summary: Medication management and Plan much improved over how anxious she was yesterday.  Possibly oversedated by the current dose of Ativan so I will cut it down to 0.5 mg for each dose as needed.  Supportive encouragement encouraging patient to get out of bed.  Possible discharge in a couple days.  Blood pressure seems to be back under good control without adding anything for the elevated reading.  Alethia Berthold, MD 06/27/2022, 2:18 PM

## 2022-06-28 DIAGNOSIS — F332 Major depressive disorder, recurrent severe without psychotic features: Secondary | ICD-10-CM | POA: Diagnosis not present

## 2022-06-28 MED ORDER — QUETIAPINE FUMARATE 25 MG PO TABS
50.0000 mg | ORAL_TABLET | Freq: Three times a day (TID) | ORAL | Status: DC
  Administered 2022-06-28 – 2022-07-02 (×12): 50 mg via ORAL
  Filled 2022-06-28 (×12): qty 2

## 2022-06-28 MED ORDER — MIRTAZAPINE 15 MG PO TABS
30.0000 mg | ORAL_TABLET | Freq: Every day | ORAL | Status: DC
  Administered 2022-06-28: 30 mg via ORAL
  Filled 2022-06-28: qty 2

## 2022-06-28 NOTE — Progress Notes (Signed)
[facility] MD Progress Note  06/28/2022 3:15 PM Natalie Rose  MRN:  [id number] Subjective: Patient seen and chart reviewed.  Patient reports she is feeling more anxious today.  Once again presents with a sort of blank stare about her and says that she is having panic attacks all through the day.  Has stresses that she has discussed before but nothing new or more specific today.  No acting out behavior and no self-harm but is still having passive suicidal thoughts.  Still has poor self-care does not mingle very much. Principal Problem: Major depressive disorder, recurrent severe without psychotic features ([place]) Diagnosis: Principal Problem:   Major depressive disorder, recurrent severe without psychotic features ([place]) Active Problems:   PTSD (post-traumatic stress disorder)   Self-inflicted laceration of wrist (HCC)   Panic attack  Total Time spent with patient: 30 minutes  Past Psychiatric History: Past history of anxiety and depression and PTSD suicide attempts  Past Medical History:  Past Medical History:  Diagnosis Date   Anxiety    Arthritis    Depression    Diabetes mellitus without complication ([place])    Mental health disorder    Migraines     Past Surgical History:  Procedure Laterality Date   MOUTH SURGERY     Family History:  Family History  Problem Relation Age of Onset   Osteopenia Mother    Heart failure Mother    Osteoarthritis Mother    Depression Mother    Diabetes Father    Family Psychiatric  History: See previous Social History:  Social History   Substance and Sexual Activity  Alcohol Use Not Currently     Social History   Substance and Sexual Activity  Drug Use Yes   Frequency: 3.0 times per week   Types: Marijuana    Social History   Socioeconomic History   Marital status: Single    Spouse name: Not on file   Number of children: Not on file   Years of education: Not on file   Highest education level: Not on file  Occupational  History   Not on file  Tobacco Use   Smoking status: Every Day    Packs/day: 1.00    Types: Cigarettes   Smokeless tobacco: Never  Vaping Use   Vaping Use: Never used  Substance and Sexual Activity   Alcohol use: Not Currently   Drug use: Yes    Frequency: 3.0 times per week    Types: Marijuana   Sexual activity: Not Currently    Birth control/protection: None  Other Topics Concern   Not on file  Social History Narrative   Not on file   Social Determinants of Health   Financial Resource Strain: Not on file  Food Insecurity: Food Insecurity Present (06/24/2022)   Hunger Vital Sign    Worried About Running Out of Food in the Last Year: Sometimes true    Ran Out of Food in the Last Year: Often true  Transportation Needs: Unmet Transportation Needs (06/24/2022)   PRAPARE - Hydrologist (Medical): Yes    Lack of Transportation (Non-Medical): Yes  Physical Activity: Not on file  Stress: Not on file  Social Connections: Not on file   Additional Social History:                         Sleep: Fair  Appetite:  Fair  Current Medications: Current Facility-Administered Medications  Medication Dose Route Frequency Provider  Last Rate Last Admin   acetaminophen (TYLENOL) tablet 650 mg  650 mg Oral Q6H PRN Charm Rings, NP   650 mg at 06/27/22 1940   alum & mag hydroxide-simeth (MAALOX/MYLANTA) 200-200-20 MG/5ML suspension 30 mL  30 mL Oral Q4H PRN Charm Rings, NP       cholecalciferol (VITAMIN D3) 25 MCG (1000 UNIT) tablet 5,000 Units  5,000 Units Oral QHS Charm Rings, NP   5,000 Units at 06/27/22 2119   cloNIDine (CATAPRES) tablet 0.1 mg  0.1 mg Oral Daily PRN Charm Rings, NP   0.1 mg at 06/25/22 3220   hydrOXYzine (ATARAX) tablet 50 mg  50 mg Oral TID PRN Charm Rings, NP   50 mg at 06/28/22 1338   LORazepam (ATIVAN) tablet 0.5 mg  0.5 mg Oral Q6H PRN Malory Spurr T, MD   0.5 mg at 06/28/22 0830   magnesium hydroxide (MILK  OF MAGNESIA) suspension 30 mL  30 mL Oral Daily PRN Charm Rings, NP       metFORMIN (GLUCOPHAGE) tablet 500 mg  500 mg Oral BID WC Charm Rings, NP   500 mg at 06/28/22 0801   mirtazapine (REMERON) tablet 30 mg  30 mg Oral QHS Arah Aro, [name], MD       neomycin-bacitracin-polymyxin (NEOSPORIN) ointment   Topical BID Eliav Mechling, [name], MD   Given at 06/28/22 0805   nicotine (NICODERM CQ - dosed in mg/24 hours) patch 14 mg  14 mg Transdermal Daily Jackilyn Umphlett, [name], MD   14 mg at 06/25/22 0757   OLANZapine (ZYPREXA) tablet 10 mg  10 mg Oral QHS Charm Rings, NP   10 mg at 06/27/22 2120   ondansetron (ZOFRAN-ODT) disintegrating tablet 4 mg  4 mg Oral Q8H PRN Kimanh Templeman, [name], MD   4 mg at 06/28/22 0830   prazosin (MINIPRESS) capsule 4 mg  4 mg Oral QHS Charm Rings, NP   4 mg at 06/27/22 2120   promethazine (PHENERGAN) tablet 25 mg  25 mg Oral Q6H PRN Majid Mccravy, [name], MD   25 mg at 06/28/22 1215   propranolol (INDERAL) tablet 10 mg  10 mg Oral TID Cherl Gorney, [name], MD   10 mg at 06/28/22 1214   QUEtiapine (SEROQUEL) tablet 50 mg  50 mg Oral TID Teigan Manner, [name], MD       sertraline (ZOLOFT) tablet 150 mg  150 mg Oral Daily Franny Selvage, [name], MD   150 mg at 06/28/22 0801   traZODone (DESYREL) tablet 100 mg  100 mg Oral QHS Charm Rings, NP   100 mg at 06/27/22 2120    Lab Results: No results found for this or any previous visit (from the past 48 hour(s)).  Blood Alcohol level:  Lab Results  Component Value Date   ETH <10 06/24/2022   ETH <10 06/15/2018    Metabolic Disorder Labs: Lab Results  Component Value Date   HGBA1C 7.3 (H) 12/16/2020   MPG 162.81 12/16/2020   No results found for: "PROLACTIN" Lab Results  Component Value Date   CHOL 234 (H) 12/16/2020   TRIG 338 (H) 12/16/2020   HDL 34 (L) 12/16/2020   CHOLHDL 6.9 12/16/2020   VLDL 68 (H) 12/16/2020   LDLCALC 132 (H) 12/16/2020    Physical Findings: AIMS: Facial and Oral Movements Muscles of Facial Expression:  None, normal Lips and Perioral Area: None, normal Jaw: None, normal Tongue: None, normal,Extremity Movements Upper (arms, wrists, hands,  fingers): None, normal Lower (legs, knees, ankles, toes): None, normal, Trunk Movements Neck, shoulders, hips: None, normal, Overall Severity Severity of abnormal movements (highest score from questions above): None, normal Incapacitation due to abnormal movements: None, normal Patient's awareness of abnormal movements (rate only patient's report): No Awareness, Dental Status Current problems with teeth and/or dentures?: No Does patient usually wear dentures?: No  CIWA:    COWS:     Musculoskeletal: Strength & Muscle Tone: within normal limits Gait & Station: normal Patient leans: N/A  Psychiatric Specialty Exam:  Presentation  General Appearance: No data recorded Eye Contact:No data recorded Speech:No data recorded Speech Volume:No data recorded Handedness:No data recorded  Mood and Affect  Mood:No data recorded Affect:No data recorded  Thought Process  Thought Processes:No data recorded Descriptions of Associations:No data recorded Orientation:No data recorded Thought Content:No data recorded History of Schizophrenia/Schizoaffective disorder:No  Duration of Psychotic Symptoms:No data recorded Hallucinations:No data recorded Ideas of Reference:No data recorded Suicidal Thoughts:No data recorded Homicidal Thoughts:No data recorded  Sensorium  Memory:No data recorded Judgment:No data recorded Insight:No data recorded  Executive Functions  Concentration:No data recorded Attention Span:No data recorded Recall:No data recorded Fund of Knowledge:No data recorded Language:No data recorded  Psychomotor Activity  Psychomotor Activity:No data recorded  Assets  Assets:No data recorded  Sleep  Sleep:No data recorded   Physical Exam: Physical Exam Vitals and nursing note reviewed.  Constitutional:      Appearance: Normal  appearance.  HENT:     Head: Normocephalic and atraumatic.     Mouth/Throat:     Pharynx: Oropharynx is clear.  Eyes:     Pupils: Pupils are equal, round, and reactive to light.  Cardiovascular:     Rate and Rhythm: Normal rate and regular rhythm.  Pulmonary:     Effort: Pulmonary effort is normal.     Breath sounds: Normal breath sounds.  Abdominal:     General: Abdomen is flat.     Palpations: Abdomen is soft.  Musculoskeletal:        General: Normal range of motion.  Skin:    General: Skin is warm and dry.  Neurological:     General: No focal deficit present.     Mental Status: She is alert. Mental status is at baseline.  Psychiatric:        Attention and Perception: She is inattentive.        Mood and Affect: Mood is anxious. Affect is blunt.        Speech: Speech is delayed.        Behavior: Behavior is slowed.        Thought Content: Thought content includes suicidal ideation. Thought content does not include suicidal plan.        Cognition and Memory: Memory is impaired.    Review of Systems  Constitutional: Negative.   HENT: Negative.    Eyes: Negative.   Respiratory: Negative.    Cardiovascular: Negative.   Gastrointestinal: Negative.   Musculoskeletal: Negative.   Skin: Negative.   Neurological: Negative.   Psychiatric/Behavioral:  Positive for depression. The patient is nervous/anxious.    Blood pressure (!) 138/97, pulse 74, temperature 98.1 F (36.7 C), temperature source Oral, resp. rate 18, height 4\' 11"  (1.499 m), weight 75.8 kg, SpO2 99 %. Body mass index is 33.73 kg/m.   Treatment Plan Summary: Medication management and Plan we had cut back on the Ativan yesterday to avoid over sedation.  She still has that as a as needed.  Still however complaining  of depression and seems confused to the point of being almost nonfunctional.  Reviewed medicines that she has tried in the past and suggested that perhaps adding a modest dose of Seroquel during the day  50 mg 3 times a day would help with this intense anxiety.  Also increasing the Remeron to 30 mg at night no change to Zoloft.  Encourage group attendance.  Alethia Berthold, MD 06/28/2022, 3:15 PM

## 2022-06-28 NOTE — Progress Notes (Signed)
Patient with decreased complaints. Continues to request Ativan for anxiety and tylenol for pain. Visible in milieu. Denies SI, HI, AVH.  Encouragement and support provided. Safety checks maintained. Meds given as prescribed. Pt receptive and remains safe on unit with q 15 min checks.

## 2022-06-28 NOTE — [facility] Group Notes (Signed)
[place] Group Notes:  (Nursing/MHT/Case Management/Adjunct)  Date:  06/28/2022  Time:  8:44 PM  Type of Therapy:   Wrap up  Participation Level:  Active  Participation Quality:  Appropriate  Affect:  Appropriate  Cognitive:  Alert  Insight:  Good  Engagement in Group:  Engaged and her goal is to get machine right.  Modes of Intervention:  Support  Summary of Progress/Problems:  Natalie Rose 06/28/2022, 8:44 PM

## 2022-06-28 NOTE — Group Note (Signed)
LCSW Group Therapy Note   Group Date: 06/28/2022 Start Time: 1300 End Time: 1400   Type of Therapy and Topic:  Group Therapy: Boundaries  Participation Level:  None  Description of Group: This group will address the use of boundaries in their personal lives. Patients will explore why boundaries are important, the difference between healthy and unhealthy boundaries, and negative and postive outcomes of different boundaries and will look at how boundaries can be crossed.  Patients will be encouraged to identify current boundaries in their own lives and identify what kind of boundary is being set. Facilitators will guide patients in utilizing problem-solving interventions to address and correct types boundaries being used and to address when no boundary is being used. Understanding and applying boundaries will be explored and addressed for obtaining and maintaining a balanced life. Patients will be encouraged to explore ways to assertively make their boundaries and needs known to significant others in their lives, using other group members and facilitator for role play, support, and feedback.  Therapeutic Goals:  1.  Patient will identify areas in their life where setting clear boundaries could be  used to improve their life.  2.  Patient will identify signs/triggers that a boundary is not being respected. 3.  Patient will identify two ways to set boundaries in order to achieve balance in  their lives: 4.  Patient will demonstrate ability to communicate their needs and set boundaries  through discussion and/or role plays  Summary of Patient Progress:  X   Therapeutic Modalities:   Cognitive Behavioral Therapy Solution-Focused Therapy  Rozann Lesches, [place] 06/28/2022  2:54 PM

## 2022-06-28 NOTE — Progress Notes (Signed)
Patient calm and pleasant during assessment denying SI/HI/AVH. Pt observed interacting appropriately with staff and peers on the unit by this Probation officer. Patient compliant with medication administration per MD orders. Pt given education, support, and encouragement to be active in her treatment plan. Pt being monitored Q 15 minutes for safety per unit protocol, remains safe on the unit

## 2022-06-28 NOTE — Plan of Care (Signed)
D: Patient alert and oriented. Patient denies pain. Patient denies depression. Patient endorses anxiety. Patient denies SI/HI/AVH.  Patient requested PRN zofran and PRN ativan with scheduled 0800 medication. Patient states that the zofran was ineffective and the ativan was effective. Patient continues to endorse anxiety and nausea during scheduled 1200 medication administration. PRN phenergan and atarax was given to patient with scheduled 1200 medication. Patient states that PRN medication is effective.   A: Scheduled medications administered to patient, per MD orders.  Support and encouragement provided to patient.  Q15 minute safety checks maintained.   R: Patient compliant with medication administration and treatment plan. No adverse drug reactions noted. Patient remains safe on the unit at this time. Problem: Education: Goal: Knowledge of General Education information will improve Description: Including pain rating scale, medication(s)/side effects and non-pharmacologic comfort measures 06/28/2022 1717 by Donette Larry, RN Outcome: Progressing 06/28/2022 0837 by Donette Larry, RN Outcome: Progressing   Problem: Clinical Measurements: Goal: Ability to maintain clinical measurements within normal limits will improve 06/28/2022 1717 by Donette Larry, RN Outcome: Progressing 06/28/2022 0837 by Donette Larry, RN Outcome: Progressing   Problem: Coping: Goal: Level of anxiety will decrease Outcome: Progressing

## 2022-06-29 DIAGNOSIS — F332 Major depressive disorder, recurrent severe without psychotic features: Secondary | ICD-10-CM | POA: Diagnosis not present

## 2022-06-29 LAB — LIPID PANEL
Cholesterol: 215 mg/dL — ABNORMAL HIGH (ref 0–200)
HDL: 29 mg/dL — ABNORMAL LOW (ref >40)
LDL Cholesterol: 144 mg/dL — ABNORMAL HIGH (ref 0–99)
Total CHOL/HDL Ratio: 7.4 RATIO
Triglycerides: 212 mg/dL — ABNORMAL HIGH (ref <150)
VLDL: 42 mg/dL — ABNORMAL HIGH (ref 0–40)

## 2022-06-29 LAB — HEMOGLOBIN A1C
Hgb A1c MFr Bld: 5.7 % — ABNORMAL HIGH (ref 4.8–5.6)
Mean Plasma Glucose: 116.89 mg/dL

## 2022-06-29 MED ORDER — MIRTAZAPINE 15 MG PO TABS
45.0000 mg | ORAL_TABLET | Freq: Every day | ORAL | Status: DC
  Administered 2022-06-29 – 2022-07-01 (×3): 45 mg via ORAL
  Filled 2022-06-29 (×3): qty 3
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
## 2022-06-29 NOTE — [facility] Suicide Risk Assessment (Signed)
[place] INPATIENT:  Family/Significant Other Suicide Prevention Education  Suicide Prevention Education:  Contact Attempts: Juliann Pulse Jackson/mother [phone]), has been identified by the patient as the family member/significant other with whom the patient will be residing, and identified as the person(s) who will aid the patient in the event of a mental health crisis.  With written consent from the patient, two attempts were made to provide suicide prevention education, prior to and/or following the patient's discharge.  We were unsuccessful in providing suicide prevention education.  A suicide education pamphlet was given to the patient to share with family/significant other.  Date and time of first attempt: 06/29/22 at 13:52 Date and time of second attempt: Second attempt needed.  CSW attempted contact but was unable to speak with mother directly. CSW was able to leave HIPAA compliant voicemail with contact information for follow through.  Shirl Harris 06/29/2022, 1:51 PM

## 2022-06-29 NOTE — Progress Notes (Signed)
Pt denies SI/HI/AVH and verbally agrees to approach staff if these become apparent or before harming themselves/others. Rates depression 0/10. Rates anxiety 0/10. Rates pain 0/10.  Pt has come up to the nurses station multiple times throughout the day. Pt still complains of nausea. Scheduled medications administered to pt, per MD orders. RN provided support and encouragement to pt. Q15 min safety checks implemented and continued. Pt safe on the unit. RN will continue to monitor and intervene as needed.  06/29/22 0826  Psych Admission Type (Psych Patients Only)  Admission Status Involuntary  Psychosocial Assessment  Patient Complaints None  Eye Contact Fair  Facial Expression Worried  Affect Appropriate to circumstance  Speech Logical/coherent  Interaction Needy  Motor Activity Slow  Appearance/Hygiene Unremarkable;In scrubs  Behavior Characteristics Cooperative;Appropriate to situation;Calm  Mood Pleasant  Aggressive Behavior  Effect No apparent injury  Thought Process  Coherency Circumstantial  Content Preoccupation  Delusions Somatic  Perception WDL  Hallucination None reported or observed  Judgment Limited  Confusion None  Danger to Self  Current suicidal ideation? Denies  Danger to Others  Danger to Others None reported or observed

## 2022-06-29 NOTE — Progress Notes (Signed)
[facility] MD Progress Note  06/29/2022 1:43 PM Natalie Rose  MRN:  [id number] Subjective: Patient seen for follow-up.  Patient continues to walk around the unit at times with a somewhat flat look on her face.  Does not interact very much.  I spoke with her and she continues to say that she is anxious without being able to really name anything specific that she is nervous about.  She denies suicidal ideation.  Has been compliant with medicine and no new physical complaints Principal Problem: Major depressive disorder, recurrent severe without psychotic features ([place]) Diagnosis: Principal Problem:   Major depressive disorder, recurrent severe without psychotic features ([place]) Active Problems:   PTSD (post-traumatic stress disorder)   Self-inflicted laceration of wrist (HCC)   Panic attack  Total Time spent with patient: 30 minutes  Past Psychiatric History: Past history of anxiety depression and PTSD  Past Medical History:  Past Medical History:  Diagnosis Date   Anxiety    Arthritis    Depression    Diabetes mellitus without complication ([place])    Mental health disorder    Migraines     Past Surgical History:  Procedure Laterality Date   MOUTH SURGERY     Family History:  Family History  Problem Relation Age of Onset   Osteopenia Mother    Heart failure Mother    Osteoarthritis Mother    Depression Mother    Diabetes Father    Family Psychiatric  History: See previous Social History:  Social History   Substance and Sexual Activity  Alcohol Use Not Currently     Social History   Substance and Sexual Activity  Drug Use Yes   Frequency: 3.0 times per week   Types: Marijuana    Social History   Socioeconomic History   Marital status: Single    Spouse name: Not on file   Number of children: Not on file   Years of education: Not on file   Highest education level: Not on file  Occupational History   Not on file  Tobacco Use   Smoking status: Every Day     Packs/day: 1.00    Types: Cigarettes   Smokeless tobacco: Never  Vaping Use   Vaping Use: Never used  Substance and Sexual Activity   Alcohol use: Not Currently   Drug use: Yes    Frequency: 3.0 times per week    Types: Marijuana   Sexual activity: Not Currently    Birth control/protection: None  Other Topics Concern   Not on file  Social History Narrative   Not on file   Social Determinants of Health   Financial Resource Strain: Not on file  Food Insecurity: Food Insecurity Present (06/24/2022)   Hunger Vital Sign    Worried About Running Out of Food in the Last Year: Sometimes true    Ran Out of Food in the Last Year: Often true  Transportation Needs: Unmet Transportation Needs (06/24/2022)   PRAPARE - Hydrologist (Medical): Yes    Lack of Transportation (Non-Medical): Yes  Physical Activity: Not on file  Stress: Not on file  Social Connections: Not on file   Additional Social History:                         Sleep: Fair  Appetite:  Fair  Current Medications: Current Facility-Administered Medications  Medication Dose Route Frequency Provider Last Rate Last Admin   acetaminophen (TYLENOL) tablet 650  mg  650 mg Oral Q6H PRN Patrecia Pour, NP   650 mg at 06/28/22 2120   alum & mag hydroxide-simeth (MAALOX/MYLANTA) 200-200-20 MG/5ML suspension 30 mL  30 mL Oral Q4H PRN Patrecia Pour, NP       cholecalciferol (VITAMIN D3) 25 MCG (1000 UNIT) tablet 5,000 Units  5,000 Units Oral QHS Patrecia Pour, NP   5,000 Units at 06/28/22 2105   cloNIDine (CATAPRES) tablet 0.1 mg  0.1 mg Oral Daily PRN Patrecia Pour, NP   0.1 mg at 06/25/22 0753   hydrOXYzine (ATARAX) tablet 50 mg  50 mg Oral TID PRN Patrecia Pour, NP   50 mg at 06/28/22 1338   LORazepam (ATIVAN) tablet 0.5 mg  0.5 mg Oral Q6H PRN Matheus Spiker T, MD   0.5 mg at 06/29/22 1219   magnesium hydroxide (MILK OF MAGNESIA) suspension 30 mL  30 mL Oral Daily PRN Patrecia Pour, NP        metFORMIN (GLUCOPHAGE) tablet 500 mg  500 mg Oral BID WC Patrecia Pour, NP   500 mg at 06/29/22 [id number]   mirtazapine (REMERON) tablet 30 mg  30 mg Oral QHS Abbigayle Toole, Madie Reno, MD   30 mg at 06/28/22 2103   neomycin-bacitracin-polymyxin (NEOSPORIN) ointment   Topical BID Lekesha Claw, Madie Reno, MD   Given at 06/28/22 1658   nicotine (NICODERM CQ - dosed in mg/24 hours) patch 14 mg  14 mg Transdermal Daily Crestina Strike, Madie Reno, MD   14 mg at 06/25/22 0757   OLANZapine (ZYPREXA) tablet 10 mg  10 mg Oral QHS Patrecia Pour, NP   10 mg at 06/28/22 2103   ondansetron (ZOFRAN-ODT) disintegrating tablet 4 mg  4 mg Oral Q8H PRN Elvia Aydin, Madie Reno, MD   4 mg at 06/29/22 1219   prazosin (MINIPRESS) capsule 4 mg  4 mg Oral QHS Patrecia Pour, NP   4 mg at 06/28/22 2105   promethazine (PHENERGAN) tablet 25 mg  25 mg Oral Q6H PRN Kennen Stammer, Madie Reno, MD   25 mg at 06/28/22 1215   propranolol (INDERAL) tablet 10 mg  10 mg Oral TID Kayani Rapaport, Madie Reno, MD   10 mg at 06/29/22 1152   QUEtiapine (SEROQUEL) tablet 50 mg  50 mg Oral TID Naome Brigandi, Madie Reno, MD   50 mg at 06/29/22 1152   sertraline (ZOLOFT) tablet 150 mg  150 mg Oral Daily Ramello Cordial, Madie Reno, MD   150 mg at 06/29/22 0825   traZODone (DESYREL) tablet 100 mg  100 mg Oral QHS Patrecia Pour, NP   100 mg at 06/28/22 2104    Lab Results: No results found for this or any previous visit (from the past 48 hour(s)).  Blood Alcohol level:  Lab Results  Component Value Date   ETH <10 06/24/2022   ETH <10 0000000    Metabolic Disorder Labs: Lab Results  Component Value Date   HGBA1C 7.3 (H) 12/16/2020   MPG 162.81 12/16/2020   No results found for: "PROLACTIN" Lab Results  Component Value Date   CHOL 234 (H) 12/16/2020   TRIG 338 (H) 12/16/2020   HDL 34 (L) 12/16/2020   CHOLHDL 6.9 12/16/2020   VLDL 68 (H) 12/16/2020   LDLCALC 132 (H) 12/16/2020    Physical Findings: AIMS: Facial and Oral Movements Muscles of Facial Expression: None, normal Lips and Perioral  Area: None, normal Jaw: None, normal Tongue: None, normal,Extremity Movements Upper (arms, wrists, hands, fingers): None, normal Lower (  legs, knees, ankles, toes): None, normal, Trunk Movements Neck, shoulders, hips: None, normal, Overall Severity Severity of abnormal movements (highest score from questions above): None, normal Incapacitation due to abnormal movements: None, normal Patient's awareness of abnormal movements (rate only patient's report): No Awareness, Dental Status Current problems with teeth and/or dentures?: No Does patient usually wear dentures?: No  CIWA:    COWS:     Musculoskeletal: Strength & Muscle Tone: within normal limits Gait & Station: normal Patient leans: N/A  Psychiatric Specialty Exam:  Presentation  General Appearance: No data recorded Eye Contact:No data recorded Speech:No data recorded Speech Volume:No data recorded Handedness:No data recorded  Mood and Affect  Mood:No data recorded Affect:No data recorded  Thought Process  Thought Processes:No data recorded Descriptions of Associations:No data recorded Orientation:No data recorded Thought Content:No data recorded History of Schizophrenia/Schizoaffective disorder:No  Duration of Psychotic Symptoms:No data recorded Hallucinations:No data recorded Ideas of Reference:No data recorded Suicidal Thoughts:No data recorded Homicidal Thoughts:No data recorded  Sensorium  Memory:No data recorded Judgment:No data recorded Insight:No data recorded  Executive Functions  Concentration:No data recorded Attention Span:No data recorded Recall:No data recorded Fund of Knowledge:No data recorded Language:No data recorded  Psychomotor Activity  Psychomotor Activity:No data recorded  Assets  Assets:No data recorded  Sleep  Sleep:No data recorded   Physical Exam: Physical Exam Vitals and nursing note reviewed.  Constitutional:      Appearance: Normal appearance.  HENT:     Head:  Normocephalic and atraumatic.     Mouth/Throat:     Pharynx: Oropharynx is clear.  Eyes:     Pupils: Pupils are equal, round, and reactive to light.  Cardiovascular:     Rate and Rhythm: Normal rate and regular rhythm.  Pulmonary:     Effort: Pulmonary effort is normal.     Breath sounds: Normal breath sounds.  Abdominal:     General: Abdomen is flat.     Palpations: Abdomen is soft.  Musculoskeletal:        General: Normal range of motion.  Skin:    General: Skin is warm and dry.  Neurological:     General: No focal deficit present.     Mental Status: She is alert. Mental status is at baseline.  Psychiatric:        Attention and Perception: She is inattentive.        Mood and Affect: Mood normal. Affect is blunt.        Speech: Speech is delayed.        Behavior: Behavior is slowed.        Thought Content: Thought content normal. Thought content does not include suicidal ideation.        Cognition and Memory: Cognition is impaired.    Review of Systems  Constitutional: Negative.   HENT: Negative.    Eyes: Negative.   Respiratory: Negative.    Cardiovascular: Negative.   Gastrointestinal: Negative.   Musculoskeletal: Negative.   Skin: Negative.   Neurological: Negative.   Psychiatric/Behavioral:  The patient is nervous/anxious.    Blood pressure 103/76, pulse (!) 112, temperature 97.9 F (36.6 C), resp. rate 16, height 4\' 11"  (1.499 m), weight 75.8 kg, SpO2 97 %. Body mass index is 33.73 kg/m.   Treatment Plan Summary: Medication management and Plan I will increase mirtazapine to 45 mg at night.  Patient is no longer voicing suicidal ideation but still reports being very anxious and depressed and looks flat most of the time.  May be approaching her baseline.  I told her I was hoping that we could probably aim for discharge by Monday.  Alethia Berthold, MD 06/29/2022, 1:43 PM

## 2022-06-29 NOTE — Plan of Care (Signed)
  Problem: Education: Goal: Knowledge of General Education information will improve Description: Including pain rating scale, medication(s)/side effects and non-pharmacologic comfort measures Outcome: Progressing   Problem: Coping: Goal: Will verbalize feelings Outcome: Progressing   Problem: Self-Concept: Goal: Level of anxiety will decrease Outcome: Progressing   Problem: Self-Concept: Goal: Level of anxiety will decrease Outcome: Progressing

## 2022-06-29 NOTE — BHH Counselor (Addendum)
CSW notified provider regarding concerns voiced by mother and desire for a call, via secure chart.   Chalmers Guest. Guerry Bruin, MSW, [place], [place] 06/29/2022 3:51 PM

## 2022-06-29 NOTE — Group Note (Signed)
[facility] LCSW Group Therapy Note   Group Date: 06/29/2022 Start Time: 1300 End Time: 1400  Type of Therapy and Topic:  Group Therapy:  Feelings around Relapse and Recovery  Participation Level:  Minimal    Description of Group:    Patients in this group will discuss emotions they experience before and after a relapse. They will process how experiencing these feelings, or avoidance of experiencing them, relates to having a relapse. Facilitator will guide patients to explore emotions they have related to recovery. Patients will be encouraged to process which emotions are more powerful. They will be guided to discuss the emotional reaction significant others in their lives may have to patients' relapse or recovery. Patients will be assisted in exploring ways to respond to the emotions of others without this contributing to a relapse.  Therapeutic Goals: Patient will identify two or more emotions that lead to relapse for them:  Patient will identify two emotions that result when they relapse:  Patient will identify two emotions related to recovery:  Patient will demonstrate ability to communicate their needs through discussion and/or role plays.   Summary of Patient Progress: Patient was present for the majority of the group process. She identified self-harm as something that she was trying to stop doing/avoid relapsing into. Outside of acknowledging this pt was absent from the discussion. However, she did appear to attend to the conversation.    Therapeutic Modalities:   Cognitive Behavioral Therapy Solution-Focused Therapy Assertiveness Training Relapse Prevention Therapy   Shirl Harris, LCSW

## 2022-06-30 DIAGNOSIS — F332 Major depressive disorder, recurrent severe without psychotic features: Secondary | ICD-10-CM | POA: Diagnosis not present

## 2022-06-30 NOTE — Progress Notes (Signed)
Patient has been cooperative with treatment on shift. She denies SI, HI & AVH. She denies depression and anxiety.

## 2022-06-30 NOTE — Plan of Care (Signed)

## 2022-06-30 NOTE — Progress Notes (Addendum)
Pt presents with depressed mood, affect anxious. Natalie Rose reports she is continuing to feel anxious , and appears depressed. She states '' I just worry about my mom and my sister. My mother had a stroke and my sister has epilepsy so I just worry about them. '' Pt reports she is sleeping and eating well and denies any other physical concerns other than continued reports of anxiety.  Pt compliant with am medications. Pt completed self inventory and rates 5/10 on scale, 10 being worst - 0 being none. Pt reported her depression at 2/10 on scale 10 being worst - 0 being none. Pt denies any SI or HI . Denies any AH or VH and does not appear to be responding to internal stimuli.  Pt is safe, able to make her needs known. Pt is safe, will con't to monitor.

## 2022-06-30 NOTE — [facility] Group Notes (Signed)
[place] Group Notes:  (Nursing/MHT/Case Management/Adjunct)  Date:  06/30/2022  Time:  4:22 PM  Type of Therapy:  Psychoeducational Skills  Participation Level:  Active  Participation Quality:  Appropriate and Attentive  Affect:  Appropriate  Cognitive:  Alert and Appropriate  Insight:  Appropriate  Engagement in Group:  Engaged  Modes of Intervention:  Activity and Socialization  Summary of Progress/Problems:  Adela Lank [facility] 06/30/2022, 4:22 PM

## 2022-06-30 NOTE — Progress Notes (Signed)
[facility] MD Progress Note  06/30/2022 12:59 PM Natalie Rose  MRN:  [id number] Subjective: [name] is seen on rounds.  She has no complaints.  No side effects from her medications.  Nurses report no issues. Principal Problem: Major depressive disorder, recurrent severe without psychotic features (HCC) Diagnosis: Principal Problem:   Major depressive disorder, recurrent severe without psychotic features (HCC) Active Problems:   PTSD (post-traumatic stress disorder)   Self-inflicted laceration of wrist (HCC)   Panic attack  Total Time spent with patient: 15 minutes  Past Psychiatric History: Depression  Past Medical History:  Past Medical History:  Diagnosis Date   Anxiety    Arthritis    Depression    Diabetes mellitus without complication (HCC)    Mental health disorder    Migraines     Past Surgical History:  Procedure Laterality Date   MOUTH SURGERY     Family History:  Family History  Problem Relation Age of Onset   Osteopenia Mother    Heart failure Mother    Osteoarthritis Mother    Depression Mother    Diabetes Father    Family Psychiatric  History: Unremarkable Social History:  Social History   Substance and Sexual Activity  Alcohol Use Not Currently     Social History   Substance and Sexual Activity  Drug Use Yes   Frequency: 3.0 times per week   Types: Marijuana    Social History   Socioeconomic History   Marital status: Single    Spouse name: Not on file   Number of children: Not on file   Years of education: Not on file   Highest education level: Not on file  Occupational History   Not on file  Tobacco Use   Smoking status: Every Day    Packs/day: 1.00    Types: Cigarettes   Smokeless tobacco: Never  Vaping Use   Vaping Use: Never used  Substance and Sexual Activity   Alcohol use: Not Currently   Drug use: Yes    Frequency: 3.0 times per week    Types: Marijuana   Sexual activity: Not Currently    Birth control/protection:  None  Other Topics Concern   Not on file  Social History Narrative   Not on file   Social Determinants of Health   Financial Resource Strain: Not on file  Food Insecurity: Food Insecurity Present (06/24/2022)   Hunger Vital Sign    Worried About Running Out of Food in the Last Year: Sometimes true    Ran Out of Food in the Last Year: Often true  Transportation Needs: Unmet Transportation Needs (06/24/2022)   PRAPARE - Administrator, Civil Service (Medical): Yes    Lack of Transportation (Non-Medical): Yes  Physical Activity: Not on file  Stress: Not on file  Social Connections: Not on file   Additional Social History:                         Sleep: Fair  Appetite:  Fair  Current Medications: Current Facility-Administered Medications  Medication Dose Route Frequency Provider Last Rate Last Admin   acetaminophen (TYLENOL) tablet 650 mg  650 mg Oral Q6H PRN Charm Rings, NP   650 mg at 06/28/22 2120   alum & mag hydroxide-simeth (MAALOX/MYLANTA) 200-200-20 MG/5ML suspension 30 mL  30 mL Oral Q4H PRN Charm Rings, NP       cholecalciferol (VITAMIN D3) 25 MCG (1000 UNIT) tablet 5,000 Units  5,000 Units Oral QHS Charm Rings, NP   5,000 Units at 06/29/22 2128   cloNIDine (CATAPRES) tablet 0.1 mg  0.1 mg Oral Daily PRN Charm Rings, NP   0.1 mg at 06/25/22 0753   hydrOXYzine (ATARAX) tablet 50 mg  50 mg Oral TID PRN Charm Rings, NP   50 mg at 06/29/22 1739   LORazepam (ATIVAN) tablet 0.5 mg  0.5 mg Oral Q6H PRN Clapacs, [name], MD   0.5 mg at 06/30/22 0843   magnesium hydroxide (MILK OF MAGNESIA) suspension 30 mL  30 mL Oral Daily PRN Charm Rings, NP       metFORMIN (GLUCOPHAGE) tablet 500 mg  500 mg Oral BID WC Charm Rings, NP   500 mg at 06/30/22 0841   mirtazapine (REMERON) tablet 45 mg  45 mg Oral QHS Clapacs, [name], MD   45 mg at 06/29/22 2130   neomycin-bacitracin-polymyxin (NEOSPORIN) ointment   Topical BID Clapacs, [name], MD    Given at 06/28/22 1658   nicotine (NICODERM CQ - dosed in mg/24 hours) patch 14 mg  14 mg Transdermal Daily Clapacs, [name], MD   14 mg at 06/25/22 0757   OLANZapine (ZYPREXA) tablet 10 mg  10 mg Oral QHS Charm Rings, NP   10 mg at 06/29/22 2129   ondansetron (ZOFRAN-ODT) disintegrating tablet 4 mg  4 mg Oral Q8H PRN Clapacs, [name], MD   4 mg at 06/29/22 1219   prazosin (MINIPRESS) capsule 4 mg  4 mg Oral QHS Charm Rings, NP   4 mg at 06/29/22 2129   promethazine (PHENERGAN) tablet 25 mg  25 mg Oral Q6H PRN Clapacs, [name], MD   25 mg at 06/28/22 1215   propranolol (INDERAL) tablet 10 mg  10 mg Oral TID Clapacs, [name], MD   10 mg at 06/30/22 1209   QUEtiapine (SEROQUEL) tablet 50 mg  50 mg Oral TID Clapacs, [name], MD   50 mg at 06/30/22 1209   sertraline (ZOLOFT) tablet 150 mg  150 mg Oral Daily Clapacs, [name], MD   150 mg at 06/30/22 0841   traZODone (DESYREL) tablet 100 mg  100 mg Oral QHS Charm Rings, NP   100 mg at 06/29/22 2129    Lab Results:  Results for orders placed or performed during the hospital encounter of 06/24/22 (from the past 48 hour(s))  Hemoglobin A1c     Status: Abnormal   Collection Time: 06/29/22  4:23 PM  Result Value Ref Range   Hgb A1c MFr Bld 5.7 (H) 4.8 - 5.6 %    Comment: (NOTE) Pre diabetes:          5.7%-6.4%  Diabetes:              >6.4%  Glycemic control for   <7.0% adults with diabetes    Mean Plasma Glucose 116.89 mg/dL    Comment: Performed at [facility], [address] [place], [place] [address]  Lipid panel     Status: Abnormal   Collection Time: 06/29/22  4:23 PM  Result Value Ref Range   Cholesterol 215 (H) 0 - 200 mg/dL   Triglycerides 329 (H) <150 mg/dL   HDL 29 (L) >51 mg/dL   Total CHOL/HDL Ratio 7.4 RATIO   VLDL 42 (H) 0 - 40 mg/dL   LDL Cholesterol 884 (H) 0 - 99 mg/dL    Comment:  Total Cholesterol/HDL:CHD Risk Coronary Heart Disease Risk Table                     Men   Women  1/2 Average Risk   3.4    3.3  Average Risk       5.0   4.4  2 X Average Risk   9.6   7.1  3 X Average Risk  23.4   11.0        Use the calculated Patient Ratio above and the CHD Risk Table to determine the patient's CHD Risk.        ATP III CLASSIFICATION (LDL):  <100     mg/dL   Optimal  100-129  mg/dL   Near or Above                    Optimal  130-159  mg/dL   Borderline  160-189  mg/dL   High  >190     mg/dL   Very High Performed at [facility], [address] [place], [place] [address]     Blood Alcohol level:  Lab Results  Component Value Date   [facility] <10 06/24/2022   ETH <10 62/83/6629    Metabolic Disorder Labs: Lab Results  Component Value Date   HGBA1C 5.7 (H) 06/29/2022   MPG 116.89 06/29/2022   MPG 162.81 12/16/2020   No results found for: "PROLACTIN" Lab Results  Component Value Date   CHOL 215 (H) 06/29/2022   TRIG 212 (H) 06/29/2022   HDL 29 (L) 06/29/2022   CHOLHDL 7.4 06/29/2022   VLDL 42 (H) 06/29/2022   LDLCALC 144 (H) 06/29/2022   LDLCALC 132 (H) 12/16/2020    Physical Findings: AIMS: Facial and Oral Movements Muscles of Facial Expression: None, normal Lips and Perioral Area: None, normal Jaw: None, normal Tongue: None, normal,Extremity Movements Upper (arms, wrists, hands, fingers): None, normal Lower (legs, knees, ankles, toes): None, normal, Trunk Movements Neck, shoulders, hips: None, normal, Overall Severity Severity of abnormal movements (highest score from questions above): None, normal Incapacitation due to abnormal movements: None, normal Patient's awareness of abnormal movements (rate only patient's report): No Awareness, Dental Status Current problems with teeth and/or dentures?: No Does patient usually wear dentures?: No  CIWA:    COWS:     Musculoskeletal: Strength & Muscle Tone: within normal limits Gait & Station: normal Patient leans: N/A  Psychiatric Specialty Exam:  Presentation  General Appearance: No data recorded Eye  Contact:No data recorded Speech:No data recorded Speech Volume:No data recorded Handedness:No data recorded  Mood and Affect  Mood:No data recorded Affect:No data recorded  Thought Process  Thought Processes:No data recorded Descriptions of Associations:No data recorded Orientation:No data recorded Thought Content:No data recorded History of Schizophrenia/Schizoaffective disorder:No  Duration of Psychotic Symptoms:No data recorded Hallucinations:No data recorded Ideas of Reference:No data recorded Suicidal Thoughts:No data recorded Homicidal Thoughts:No data recorded  Sensorium  Memory:No data recorded Judgment:No data recorded Insight:No data recorded  Executive Functions  Concentration:No data recorded Attention Span:No data recorded Recall:No data recorded Fund of Knowledge:No data recorded Language:No data recorded  Psychomotor Activity  Psychomotor Activity:No data recorded  Assets  Assets:No data recorded  Sleep  Sleep:No data recorded   Physical Exam: Physical Exam Vitals and nursing note reviewed.  Constitutional:      Appearance: Normal appearance. She is normal weight.  Neurological:     General: No focal deficit present.     Mental Status: She is alert and  oriented to person, place, and time.  Psychiatric:        Mood and Affect: Mood normal.        Behavior: Behavior normal.    Review of Systems  Constitutional: Negative.   HENT: Negative.    Eyes: Negative.   Respiratory: Negative.    Cardiovascular: Negative.   Gastrointestinal: Negative.   Genitourinary: Negative.   Musculoskeletal: Negative.   Skin: Negative.   Neurological: Negative.   Endo/Heme/Allergies: Negative.   Psychiatric/Behavioral: Negative.     Blood pressure 103/75, pulse 80, temperature 98.3 F (36.8 C), temperature source Oral, resp. rate 16, height 4\' 11"  (1.499 m), weight 75.8 kg, SpO2 98 %. Body mass index is 33.73 kg/m.   Treatment Plan Summary: Daily  contact with patient to assess and evaluate symptoms and progress in treatment, Medication management, and Plan continue current medications.  Parks Ranger, DO 06/30/2022, 12:59 PM

## 2022-07-01 DIAGNOSIS — F332 Major depressive disorder, recurrent severe without psychotic features: Secondary | ICD-10-CM | POA: Diagnosis not present

## 2022-07-01 NOTE — Progress Notes (Signed)
[facility] MD Progress Note  07/01/2022 11:13 AM Natalie Rose  MRN:  [id number] Subjective: Natalie Rose is seen on rounds.  She does not have any complaints.  Nurses report no issues.  No side effects. Principal Problem: Major depressive disorder, recurrent severe without psychotic features (HCC) Diagnosis: Principal Problem:   Major depressive disorder, recurrent severe without psychotic features (HCC) Active Problems:   PTSD (post-traumatic stress disorder)   Self-inflicted laceration of wrist (HCC)   Panic attack  Total Time spent with patient: 15 minutes  Past Psychiatric History: Depression  Past Medical History:  Past Medical History:  Diagnosis Date   Anxiety    Arthritis    Depression    Diabetes mellitus without complication (HCC)    Mental health disorder    Migraines     Past Surgical History:  Procedure Laterality Date   MOUTH SURGERY     Family History:  Family History  Problem Relation Age of Onset   Osteopenia Mother    Heart failure Mother    Osteoarthritis Mother    Depression Mother    Diabetes Father    Family Psychiatric  History: Unremarkable Social History:  Social History   Substance and Sexual Activity  Alcohol Use Not Currently     Social History   Substance and Sexual Activity  Drug Use Yes   Frequency: 3.0 times per week   Types: Marijuana    Social History   Socioeconomic History   Marital status: Single    Spouse name: Not on file   Number of children: Not on file   Years of education: Not on file   Highest education level: Not on file  Occupational History   Not on file  Tobacco Use   Smoking status: Every Day    Packs/day: 1.00    Types: Cigarettes   Smokeless tobacco: Never  Vaping Use   Vaping Use: Never used  Substance and Sexual Activity   Alcohol use: Not Currently   Drug use: Yes    Frequency: 3.0 times per week    Types: Marijuana   Sexual activity: Not Currently    Birth control/protection: None   Other Topics Concern   Not on file  Social History Narrative   Not on file   Social Determinants of Health   Financial Resource Strain: Not on file  Food Insecurity: Food Insecurity Present (06/24/2022)   Hunger Vital Sign    Worried About Running Out of Food in the Last Year: Sometimes true    Ran Out of Food in the Last Year: Often true  Transportation Needs: Unmet Transportation Needs (06/24/2022)   PRAPARE - Administrator, Civil Service (Medical): Yes    Lack of Transportation (Non-Medical): Yes  Physical Activity: Not on file  Stress: Not on file  Social Connections: Not on file   Additional Social History:                         Sleep: Good  Appetite:  Good  Current Medications: Current Facility-Administered Medications  Medication Dose Route Frequency Provider Last Rate Last Admin   acetaminophen (TYLENOL) tablet 650 mg  650 mg Oral Q6H PRN Charm Rings, NP   650 mg at 06/28/22 2120   alum & mag hydroxide-simeth (MAALOX/MYLANTA) 200-200-20 MG/5ML suspension 30 mL  30 mL Oral Q4H PRN Charm Rings, NP       cholecalciferol (VITAMIN D3) 25 MCG (1000 UNIT) tablet 5,000 Units  5,000 Units Oral QHS Patrecia Pour, NP   5,000 Units at 06/30/22 2135   cloNIDine (CATAPRES) tablet 0.1 mg  0.1 mg Oral Daily PRN Patrecia Pour, NP   0.1 mg at 06/25/22 0753   hydrOXYzine (ATARAX) tablet 50 mg  50 mg Oral TID PRN Patrecia Pour, NP   50 mg at 06/29/22 1739   LORazepam (ATIVAN) tablet 0.5 mg  0.5 mg Oral Q6H PRN Clapacs, Madie Reno, MD   0.5 mg at 06/30/22 0843   magnesium hydroxide (MILK OF MAGNESIA) suspension 30 mL  30 mL Oral Daily PRN Patrecia Pour, NP       metFORMIN (GLUCOPHAGE) tablet 500 mg  500 mg Oral BID WC Patrecia Pour, NP   500 mg at 07/01/22 0845   mirtazapine (REMERON) tablet 45 mg  45 mg Oral QHS Clapacs, Madie Reno, MD   45 mg at 06/30/22 2136   neomycin-bacitracin-polymyxin (NEOSPORIN) ointment   Topical BID Clapacs, Madie Reno, MD   Given at  07/01/22 0845   nicotine (NICODERM CQ - dosed in mg/24 hours) patch 14 mg  14 mg Transdermal Daily Clapacs, Madie Reno, MD   14 mg at 06/25/22 0757   OLANZapine (ZYPREXA) tablet 10 mg  10 mg Oral QHS Patrecia Pour, NP   10 mg at 06/30/22 2136   ondansetron (ZOFRAN-ODT) disintegrating tablet 4 mg  4 mg Oral Q8H PRN Clapacs, Madie Reno, MD   4 mg at 06/29/22 1219   prazosin (MINIPRESS) capsule 4 mg  4 mg Oral QHS Patrecia Pour, NP   4 mg at 06/30/22 2135   promethazine (PHENERGAN) tablet 25 mg  25 mg Oral Q6H PRN Clapacs, Madie Reno, MD   25 mg at 06/28/22 1215   propranolol (INDERAL) tablet 10 mg  10 mg Oral TID Clapacs, Madie Reno, MD   10 mg at 07/01/22 0844   QUEtiapine (SEROQUEL) tablet 50 mg  50 mg Oral TID Clapacs, Madie Reno, MD   50 mg at 07/01/22 0844   sertraline (ZOLOFT) tablet 150 mg  150 mg Oral Daily Clapacs, Madie Reno, MD   150 mg at 07/01/22 0844   traZODone (DESYREL) tablet 100 mg  100 mg Oral QHS Patrecia Pour, NP   100 mg at 06/30/22 2136    Lab Results:  Results for orders placed or performed during the hospital encounter of 06/24/22 (from the past 48 hour(s))  Hemoglobin A1c     Status: Abnormal   Collection Time: 06/29/22  4:23 PM  Result Value Ref Range   Hgb A1c MFr Bld 5.7 (H) 4.8 - 5.6 %    Comment: (NOTE) Pre diabetes:          5.7%-6.4%  Diabetes:              >6.4%  Glycemic control for   <7.0% adults with diabetes    Mean Plasma Glucose 116.89 mg/dL    Comment: Performed at [facility], [address], [place], [place] [address]  Lipid panel     Status: Abnormal   Collection Time: 06/29/22  4:23 PM  Result Value Ref Range   Cholesterol 215 (H) 0 - 200 mg/dL   Triglycerides 212 (H) <150 mg/dL   HDL 29 (L) >40 mg/dL   Total CHOL/HDL Ratio 7.4 RATIO   VLDL 42 (H) 0 - 40 mg/dL   LDL Cholesterol 144 (H) 0 - 99 mg/dL    Comment:  Total Cholesterol/HDL:CHD Risk Coronary Heart Disease Risk Table                     Men   Women  1/2 Average Risk   3.4   3.3   Average Risk       5.0   4.4  2 X Average Risk   9.6   7.1  3 X Average Risk  23.4   11.0        Use the calculated Patient Ratio above and the CHD Risk Table to determine the patient's CHD Risk.        ATP III CLASSIFICATION (LDL):  <100     mg/dL   Optimal  100-129  mg/dL   Near or Above                    Optimal  130-159  mg/dL   Borderline  160-189  mg/dL   High  >190     mg/dL   Very High Performed at [facility], [address] [place], [place] [address]     Blood Alcohol level:  Lab Results  Component Value Date   [facility] <10 06/24/2022   ETH <10 54/00/8676    Metabolic Disorder Labs: Lab Results  Component Value Date   HGBA1C 5.7 (H) 06/29/2022   MPG 116.89 06/29/2022   MPG 162.81 12/16/2020   No results found for: "PROLACTIN" Lab Results  Component Value Date   CHOL 215 (H) 06/29/2022   TRIG 212 (H) 06/29/2022   HDL 29 (L) 06/29/2022   CHOLHDL 7.4 06/29/2022   VLDL 42 (H) 06/29/2022   LDLCALC 144 (H) 06/29/2022   LDLCALC 132 (H) 12/16/2020    Physical Findings: AIMS: Facial and Oral Movements Muscles of Facial Expression: None, normal Lips and Perioral Area: None, normal Jaw: None, normal Tongue: None, normal,Extremity Movements Upper (arms, wrists, hands, fingers): None, normal Lower (legs, knees, ankles, toes): None, normal, Trunk Movements Neck, shoulders, hips: None, normal, Overall Severity Severity of abnormal movements (highest score from questions above): None, normal Incapacitation due to abnormal movements: None, normal Patient's awareness of abnormal movements (rate only patient's report): No Awareness, Dental Status Current problems with teeth and/or dentures?: No Does patient usually wear dentures?: No  CIWA:    COWS:     Musculoskeletal: Strength & Muscle Tone: within normal limits Gait & Station: normal Patient leans: N/A  Psychiatric Specialty Exam:  Presentation  General Appearance: No data recorded Eye  Contact:No data recorded Speech:No data recorded Speech Volume:No data recorded Handedness:No data recorded  Mood and Affect  Mood:No data recorded Affect:No data recorded  Thought Process  Thought Processes:No data recorded Descriptions of Associations:No data recorded Orientation:No data recorded Thought Content:No data recorded History of Schizophrenia/Schizoaffective disorder:No  Duration of Psychotic Symptoms:No data recorded Hallucinations:No data recorded Ideas of Reference:No data recorded Suicidal Thoughts:No data recorded Homicidal Thoughts:No data recorded  Sensorium  Memory:No data recorded Judgment:No data recorded Insight:No data recorded  Executive Functions  Concentration:No data recorded Attention Span:No data recorded Recall:No data recorded Fund of Knowledge:No data recorded Language:No data recorded  Psychomotor Activity  Psychomotor Activity:No data recorded  Assets  Assets:No data recorded  Sleep  Sleep:No data recorded   Physical Exam: Physical Exam Vitals and nursing note reviewed.  Constitutional:      Appearance: Normal appearance. She is normal weight.  Neurological:     General: No focal deficit present.     Mental Status: She is alert and  oriented to person, place, and time.  Psychiatric:        Mood and Affect: Mood normal.        Behavior: Behavior normal.    Review of Systems  Constitutional: Negative.   HENT: Negative.    Eyes: Negative.   Respiratory: Negative.    Cardiovascular: Negative.   Gastrointestinal: Negative.   Genitourinary: Negative.   Musculoskeletal: Negative.   Skin: Negative.   Neurological: Negative.   Endo/Heme/Allergies: Negative.   Psychiatric/Behavioral: Negative.     Blood pressure 120/77, pulse 71, temperature 98.4 F (36.9 C), temperature source Oral, resp. rate 18, height 4\' 11"  (1.499 m), weight 75.8 kg, SpO2 98 %. Body mass index is 33.73 kg/m.   Treatment Plan Summary: Daily  contact with patient to assess and evaluate symptoms and progress in treatment, Medication management, and Plan continue current medication.  Bill Yohn , DO 07/01/2022, 11:13 AM

## 2022-07-01 NOTE — Progress Notes (Signed)
Patient was cooperative with treatment on shift, She denies SI, HI & AVH. She has been compliant with medications. No new issues to report on shift at this time.

## 2022-07-01 NOTE — Plan of Care (Signed)
Pt is calm and cooperative, pleasant, moderately flat affect but reporting much lower anxiety.  Pt reported no troubling symptoms and denied SI HI AVH.  Compliant with medication.  Will continue to monitor q 15 min checks.

## 2022-07-02 DIAGNOSIS — F332 Major depressive disorder, recurrent severe without psychotic features: Secondary | ICD-10-CM | POA: Diagnosis not present

## 2022-07-02 MED ORDER — OLANZAPINE 10 MG PO TABS: 10.0000 mg | ORAL_TABLET | Freq: Every day | ORAL | 1 refills | Status: DC

## 2022-07-02 MED ORDER — TRAZODONE HCL 100 MG PO TABS: 100.0000 mg | ORAL_TABLET | Freq: Every day | ORAL | 1 refills | Status: DC

## 2022-07-02 MED ORDER — METFORMIN HCL 500 MG PO TABS: 500.0000 mg | ORAL_TABLET | Freq: Two times a day (BID) | ORAL | 1 refills | Status: DC

## 2022-07-02 MED ORDER — NICOTINE 14 MG/24HR TD PT24: 14.0000 mg | MEDICATED_PATCH | Freq: Every day | TRANSDERMAL | 1 refills | Status: DC

## 2022-07-02 MED ORDER — QUETIAPINE FUMARATE 50 MG PO TABS: 50.0000 mg | ORAL_TABLET | Freq: Three times a day (TID) | ORAL | 1 refills | Status: DC

## 2022-07-02 MED ORDER — MIRTAZAPINE 45 MG PO TABS: 45.0000 mg | ORAL_TABLET | Freq: Every day | ORAL | 1 refills | Status: DC

## 2022-07-02 MED ORDER — PRAZOSIN HCL 2 MG PO CAPS: 4.0000 mg | ORAL_CAPSULE | Freq: Every day | ORAL | 1 refills | Status: DC

## 2022-07-02 MED ORDER — HYDROXYZINE HCL 50 MG PO TABS: 50.0000 mg | ORAL_TABLET | Freq: Three times a day (TID) | ORAL | 1 refills | Status: DC | PRN

## 2022-07-02 MED ORDER — SERTRALINE HCL 50 MG PO TABS: 150.0000 mg | ORAL_TABLET | Freq: Every day | ORAL | 1 refills | Status: DC

## 2022-07-02 MED ORDER — PROMETHAZINE HCL 25 MG PO TABS: 25.0000 mg | ORAL_TABLET | Freq: Four times a day (QID) | ORAL | 1 refills | Status: DC | PRN

## 2022-07-02 MED ORDER — PROPRANOLOL HCL 10 MG PO TABS: 10.0000 mg | ORAL_TABLET | Freq: Three times a day (TID) | ORAL | 1 refills | Status: DC

## 2022-07-02 MED ORDER — ONDANSETRON 4 MG PO TBDP: 4.0000 mg | ORAL_TABLET | Freq: Three times a day (TID) | ORAL | 1 refills | Status: DC | PRN

## 2022-07-02 NOTE — Discharge Summary (Signed)
Physician Discharge Summary Note  Patient:  Natalie Rose is an 42 y.o., female MRN:  [id number] DOB:  [birth year] Patient phone:  [phone] (home)  Patient address:   [place] [address],  Total Time spent with patient: 30 minutes  Date of Admission:  06/24/2022 Date of Discharge: 07/02/2022  Reason for Admission: Patient was admitted with worsening anxiety and depression self-injury some suicidal ideation and mood instability.  Principal Problem: Major depressive disorder, recurrent severe without psychotic features [facility]) Discharge Diagnoses: Principal Problem:   Major depressive disorder, recurrent severe without psychotic features ([place]) Active Problems:   PTSD (post-traumatic stress disorder)   Self-inflicted laceration of wrist (H[facility])   Panic attack   Past Psychiatric History: Past history of longstanding anxiety and PTSD and depression with mood instability.  Past Medical History:  Past Medical History:  Diagnosis Date   Anxiety    Arthritis    Depression    Diabetes mellitus without complication ([place])    Mental health disorder    Migraines     Past Surgical History:  Procedure Laterality Date   MOUTH SURGERY     Family History:  Family History  Problem Relation Age of Onset   Osteopenia Mother    Heart failure Mother    Osteoarthritis Mother    Depression Mother    Diabetes Father    Family Psychiatric  History: See previous Social History:  Social History   Substance and Sexual Activity  Alcohol Use Not Currently     Social History   Substance and Sexual Activity  Drug Use Yes   Frequency: 3.0 times per week   Types: Marijuana    Social History   Socioeconomic History   Marital status: Single    Spouse name: Not on file   Number of children: Not on file   Years of education: Not on file   Highest education level: Not on file  Occupational History   Not on file  Tobacco Use   Smoking status: Every Day     Packs/day: 1.00    Types: Cigarettes   Smokeless tobacco: Never  Vaping Use   Vaping Use: Never used  Substance and Sexual Activity   Alcohol use: Not Currently   Drug use: Yes    Frequency: 3.0 times per week    Types: Marijuana   Sexual activity: Not Currently    Birth control/protection: None  Other Topics Concern   Not on file  Social History Narrative   Not on file   Social Determinants of Health   Financial Resource Strain: Not on file  Food Insecurity: Food Insecurity Present (06/24/2022)   Hunger Vital Sign    Worried About Running Out of Food in the Last Year: Sometimes true    Ran Out of Food in the Last Year: Often true  Transportation Needs: Unmet Transportation Needs (06/24/2022)   PRAPARE - Hydrologist (Medical): Yes    Lack of Transportation (Non-Medical): Yes  Physical Activity: Not on file  Stress: Not on file  Social Connections: Not on file    Hospital Course: Patient engaged in individual and group counseling.  Met with treatment team.  Medications were adjusted as appropriate to manage cardinal symptoms of anxiety with panic-like symptoms as well as depression.  Patient did not display any dangerous behavior in the hospital.  At the time of discharge she reports that her anxiety is much improved.  She denies any suicidal thoughts.  Denies any psychosis.  Patient was continued on olanzapine rather than her newer antipsychotic but that can be adjusted by her outpatient provider.  At the time of discharge she is tolerating medicine well.  She will be discharged on the current medicine orders with follow-up with her usual outpatient psychiatric team.  Contact was made with her mother with the patient's consent during her time in the hospital.  Physical Findings: AIMS: Facial and Oral Movements Muscles of Facial Expression: None, normal Lips and Perioral Area: None, normal Jaw: None, normal Tongue: None, normal,Extremity  Movements Upper (arms, wrists, hands, fingers): None, normal Lower (legs, knees, ankles, toes): None, normal, Trunk Movements Neck, shoulders, hips: None, normal, Overall Severity Severity of abnormal movements (highest score from questions above): None, normal Incapacitation due to abnormal movements: None, normal Patient's awareness of abnormal movements (rate only patient's report): No Awareness, Dental Status Current problems with teeth and/or dentures?: No Does patient usually wear dentures?: No  CIWA:    COWS:     Musculoskeletal: Strength & Muscle Tone: within normal limits Gait & Station: normal Patient leans: N/A   Psychiatric Specialty Exam:  Presentation  General Appearance: No data recorded Eye Contact:No data recorded Speech:No data recorded Speech Volume:No data recorded Handedness:No data recorded  Mood and Affect  Mood:No data recorded Affect:No data recorded  Thought Process  Thought Processes:No data recorded Descriptions of Associations:No data recorded Orientation:No data recorded Thought Content:No data recorded History of Schizophrenia/Schizoaffective disorder:No  Duration of Psychotic Symptoms:No data recorded Hallucinations:No data recorded Ideas of Reference:No data recorded Suicidal Thoughts:No data recorded Homicidal Thoughts:No data recorded  Sensorium  Memory:No data recorded Judgment:No data recorded Insight:No data recorded  Executive Functions  Concentration:No data recorded Attention Span:No data recorded Recall:No data recorded Fund of Knowledge:No data recorded Language:No data recorded  Psychomotor Activity  Psychomotor Activity:No data recorded  Assets  Assets:No data recorded  Sleep  Sleep:No data recorded   Physical Exam: Physical Exam Vitals and nursing note reviewed.  Constitutional:      Appearance: Normal appearance.  HENT:     Head: Normocephalic and atraumatic.     Mouth/Throat:     Pharynx:  Oropharynx is clear.  Eyes:     Pupils: Pupils are equal, round, and reactive to light.  Cardiovascular:     Rate and Rhythm: Normal rate and regular rhythm.  Pulmonary:     Effort: Pulmonary effort is normal.     Breath sounds: Normal breath sounds.  Abdominal:     General: Abdomen is flat.     Palpations: Abdomen is soft.  Musculoskeletal:        General: Normal range of motion.  Skin:    General: Skin is warm and dry.  Neurological:     General: No focal deficit present.     Mental Status: She is alert. Mental status is at baseline.  Psychiatric:        Attention and Perception: Attention normal.        Mood and Affect: Mood normal.        Speech: Speech normal.        Behavior: Behavior normal.        Thought Content: Thought content normal.        Cognition and Memory: Cognition normal.        Judgment: Judgment normal.    Review of Systems  Constitutional: Negative.   HENT: Negative.    Eyes: Negative.   Respiratory: Negative.    Cardiovascular: Negative.   Gastrointestinal:  Negative.   Musculoskeletal: Negative.   Skin: Negative.   Neurological: Negative.   Psychiatric/Behavioral: Negative.     Blood pressure 101/65, pulse 63, temperature 98.6 F (37 C), temperature source Oral, resp. rate 19, height 4\' 11"  (1.499 m), weight 75.8 kg, SpO2 99 %. Body mass index is 33.73 kg/m.   Social History   Tobacco Use  Smoking Status Every Day   Packs/day: 1.00   Types: Cigarettes  Smokeless Tobacco Never   Tobacco Cessation:  A prescription for an FDA-approved tobacco cessation medication provided at discharge   Blood Alcohol level:  Lab Results  Component Value Date   [facility] <10 06/24/2022   ETH <10 73/53/2992    Metabolic Disorder Labs:  Lab Results  Component Value Date   HGBA1C 5.7 (H) 06/29/2022   MPG 116.89 06/29/2022   MPG 162.81 12/16/2020   No results found for: "PROLACTIN" Lab Results  Component Value Date   CHOL 215 (H) 06/29/2022   TRIG  212 (H) 06/29/2022   HDL 29 (L) 06/29/2022   CHOLHDL 7.4 06/29/2022   VLDL 42 (H) 06/29/2022   LDLCALC 144 (H) 06/29/2022   LDLCALC 132 (H) 12/16/2020    See Psychiatric Specialty Exam and Suicide Risk Assessment completed by Attending Physician prior to discharge.  Discharge destination:  Home  Is patient on multiple antipsychotic therapies at discharge:  No   Has Patient had three or more failed trials of antipsychotic monotherapy by history:  No  Recommended Plan for Multiple Antipsychotic Therapies: NA  Discharge Instructions     Diet - low sodium heart healthy   Complete by: As directed    Increase activity slowly   Complete by: As directed       Allergies as of 07/02/2022       Reactions   Diazepam Hives, Anxiety, Itching, Other (See Comments), Rash   unknown unknown   Lurasidone         Medication List     STOP taking these medications    aluminum-magnesium hydroxide-simethicone 200-200-20 MG/5ML Susp Commonly known as: MAALOX   cholecalciferol 25 MCG (1000 UNIT) tablet Commonly known as: VITAMIN D3   Combivent Respimat 20-100 MCG/ACT Aers respimat Generic drug: Ipratropium-Albuterol   dicyclomine 10 MG capsule Commonly known as: Bentyl   loperamide 2 MG capsule Commonly known as: IMODIUM   Lybalvi 10-10 MG Tabs Generic drug: OLANZapine-Samidorphan   naproxen 500 MG tablet Commonly known as: NAPROSYN   pantoprazole 40 MG tablet Commonly known as: PROTONIX       TAKE these medications      Indication  hydrOXYzine 50 MG tablet Commonly known as: ATARAX Take 1 tablet (50 mg total) by mouth 3 (three) times daily as needed.  Indication: Feeling Anxious   metFORMIN 500 MG tablet Commonly known as: Glucophage Take 1 tablet (500 mg total) by mouth 2 (two) times daily with a meal.  Indication: Type 2 Diabetes   mirtazapine 45 MG tablet Commonly known as: REMERON Take 1 tablet (45 mg total) by mouth at bedtime. What changed:   medication strength how much to take  Indication: Major Depressive Disorder, Panic Disorder   nicotine 14 mg/24hr patch Commonly known as: NICODERM CQ - dosed in mg/24 hours Place 1 patch (14 mg total) onto the skin daily. Start taking on: July 03, 2022  Indication: Nicotine Addiction   OLANZapine 10 MG tablet Commonly known as: ZYPREXA Take 1 tablet (10 mg total) by mouth at bedtime.  Indication: MIXED BIPOLAR AFFECTIVE DISORDER  ondansetron 4 MG disintegrating tablet Commonly known as: Zofran ODT Take 1 tablet (4 mg total) by mouth every 8 (eight) hours as needed.  Indication: Nausea and Vomiting   prazosin 2 MG capsule Commonly known as: MINIPRESS Take 2 capsules (4 mg total) by mouth at bedtime.  Indication: Frightening Dreams, Disturbed Sleep   promethazine 25 MG tablet Commonly known as: PHENERGAN Take 1 tablet (25 mg total) by mouth every 6 (six) hours as needed for nausea or vomiting.  Indication: Nausea and Vomiting in Pregnancy   propranolol 10 MG tablet Commonly known as: INDERAL Take 1 tablet (10 mg total) by mouth 3 (three) times daily.  Indication: Feeling Anxious   QUEtiapine 50 MG tablet Commonly known as: SEROQUEL Take 1 tablet (50 mg total) by mouth 3 (three) times daily.  Indication: Depressive Phase of Manic-Depression, Generalized Anxiety Disorder   sertraline 50 MG tablet Commonly known as: ZOLOFT Take 3 tablets (150 mg total) by mouth daily. Start taking on: July 03, 2022  Indication: Generalized Anxiety Disorder, Panic Disorder   SUMAtriptan 50 MG tablet Commonly known as: IMITREX Take 50 mg by mouth every 2 (two) hours as needed. What changed: Another medication with the same name was removed. Continue taking this medication, and follow the directions you see here.  Indication: Migraine Headache   traZODone 100 MG tablet Commonly known as: DESYREL Take 1 tablet (100 mg total) by mouth at bedtime.  Indication: [place], [place].. Call.   Why: You have a virtual appointment scheduled for tomorrow, 07/03/22 at [facility]. Thanks! Contact information: [address]  [place], [place] [address] Phone: [phone]                Follow-up recommendations:  Other:  Follow-up with usual [facility]  Prescriptions provided.  Comments: See above  Signed: Alethia Berthold, MD 07/02/2022, 10:33 AM

## 2022-07-02 NOTE — Progress Notes (Signed)
Discharge note: SSP and survey complete. RN met with pt and reviewed pt's discharge instructions. Pt verbalized understanding of discharge instructions and pt did not have any questions. RN reviewed and provided pt with a copy of SRA, AVS and Transition Record. RN returned pt's belongings to pt. Prescriptions were given to pt. Pt denied SI/HI/AVH and voiced no concerns. Pt was appreciative of the care pt received at [facility]. Patient discharged to their ride without incident.  07/02/22 0815  Psych Admission Type (Psych Patients Only)  Admission Status Involuntary  Psychosocial Assessment  Patient Complaints None  Eye Contact Fair  Facial Expression Wide-eyed  Affect Appropriate to circumstance  Speech Logical/coherent  Interaction Isolative  Motor Activity Slow  Appearance/Hygiene Unremarkable  Behavior Characteristics Cooperative;Appropriate to situation;Calm  Mood Pleasant;Sad  Aggressive Behavior  Effect No apparent injury  Thought Process  Coherency WDL  Content WDL  Delusions None reported or observed  Perception WDL  Hallucination None reported or observed  Judgment Impaired  Confusion None  Danger to Self  Current suicidal ideation? Denies  Danger to Others  Danger to Others None reported or observed

## 2022-07-02 NOTE — Plan of Care (Signed)
Pt is calm, cooperative, and compliant.  Denies SI HI AVH.  No physical or emotional complaints.  States she hopes to leave tomorrow.

## 2022-07-02 NOTE — Care Management Important Message (Signed)
Important Message  Patient Details  Name: Natalie Rose MRN: [id number] Date of Birth: 1980/07/15   Medicare Important Message Given:  Yes     Rozann Lesches, LCSW 07/02/2022, 11:29 AM

## 2022-07-02 NOTE — BH IP Treatment Plan (Signed)
Interdisciplinary Treatment and Diagnostic Plan Update  07/02/2022 Time of Session: 8:30 AM Natalie Rose MRN: [id number]  Principal Diagnosis: Major depressive disorder, recurrent severe without psychotic features (HCC)  Secondary Diagnoses: Principal Problem:   Major depressive disorder, recurrent severe without psychotic features (HCC) Active Problems:   PTSD (post-traumatic stress disorder)   Self-inflicted laceration of wrist (HCC)   Panic attack   Current Medications:  Current Facility-Administered Medications  Medication Dose Route Frequency Provider Last Rate Last Admin   acetaminophen (TYLENOL) tablet 650 mg  650 mg Oral Q6H PRN Charm Rings, NP   650 mg at 07/01/22 1614   alum & mag hydroxide-simeth (MAALOX/MYLANTA) 200-200-20 MG/5ML suspension 30 mL  30 mL Oral Q4H PRN Charm Rings, NP       cholecalciferol (VITAMIN D3) 25 MCG (1000 UNIT) tablet 5,000 Units  5,000 Units Oral QHS Charm Rings, NP   5,000 Units at 07/01/22 2138   cloNIDine (CATAPRES) tablet 0.1 mg  0.1 mg Oral Daily PRN Charm Rings, NP   0.1 mg at 06/25/22 0753   hydrOXYzine (ATARAX) tablet 50 mg  50 mg Oral TID PRN Charm Rings, NP   50 mg at 06/29/22 1739   LORazepam (ATIVAN) tablet 0.5 mg  0.5 mg Oral Q6H PRN Clapacs, [name], MD   0.5 mg at 06/30/22 0843   magnesium hydroxide (MILK OF MAGNESIA) suspension 30 mL  30 mL Oral Daily PRN Charm Rings, NP       metFORMIN (GLUCOPHAGE) tablet 500 mg  500 mg Oral BID WC Charm Rings, NP   500 mg at 07/02/22 0815   mirtazapine (REMERON) tablet 45 mg  45 mg Oral QHS Clapacs, [name], MD   45 mg at 07/01/22 2137   neomycin-bacitracin-polymyxin (NEOSPORIN) ointment   Topical BID Clapacs, [name], MD   Given at 07/01/22 1618   nicotine (NICODERM CQ - dosed in mg/24 hours) patch 14 mg  14 mg Transdermal Daily Clapacs, [name], MD   14 mg at 06/25/22 0757   OLANZapine (ZYPREXA) tablet 10 mg  10 mg Oral QHS Charm Rings, NP   10 mg at  07/01/22 2141   ondansetron (ZOFRAN-ODT) disintegrating tablet 4 mg  4 mg Oral Q8H PRN Clapacs, [name], MD   4 mg at 06/29/22 1219   prazosin (MINIPRESS) capsule 4 mg  4 mg Oral QHS Charm Rings, NP   4 mg at 07/01/22 2141   promethazine (PHENERGAN) tablet 25 mg  25 mg Oral Q6H PRN Clapacs, [name], MD   25 mg at 06/28/22 1215   propranolol (INDERAL) tablet 10 mg  10 mg Oral TID Clapacs, [name], MD   10 mg at 07/01/22 1613   QUEtiapine (SEROQUEL) tablet 50 mg  50 mg Oral TID Clapacs, [name], MD   50 mg at 07/02/22 0815   sertraline (ZOLOFT) tablet 150 mg  150 mg Oral Daily Clapacs, [name], MD   150 mg at 07/02/22 0815   traZODone (DESYREL) tablet 100 mg  100 mg Oral QHS Charm Rings, NP   100 mg at 07/01/22 2141   PTA Medications: Medications Prior to Admission  Medication Sig Dispense Refill Last Dose   aluminum-magnesium hydroxide-simethicone (MAALOX) 200-200-20 MG/5ML SUSP Take 30 mLs by mouth 4 (four) times daily -  before meals and at bedtime. 1680 mL 0    cholecalciferol (VITAMIN D3) 25 MCG (1000 UNIT) tablet Take 5,000 Units by mouth at bedtime.  COMBIVENT RESPIMAT 20-100 MCG/ACT AERS respimat 1 puff 4 (four) times daily.      dicyclomine (BENTYL) 10 MG capsule Take 1 capsule (10 mg total) by mouth in the morning and at bedtime for 14 days. 28 capsule 0    hydrOXYzine (ATARAX/VISTARIL) 50 MG tablet Take 50 mg by mouth 3 (three) times daily as needed.      loperamide (IMODIUM) 2 MG capsule Take 1 capsule (2 mg total) by mouth 4 (four) times daily as needed for diarrhea or loose stools. 12 capsule 0    LYBALVI 10-10 MG TABS Take 1 tablet by mouth daily. This is to replace Zyprexa 10 mg daily although she has not started taking yet      metFORMIN (GLUCOPHAGE) 500 MG tablet Take 1 tablet (500 mg total) by mouth 2 (two) times daily with a meal. 60 tablet 0    mirtazapine (REMERON) 15 MG tablet Take 15 mg by mouth at bedtime.      naproxen (NAPROSYN) 500 MG tablet Take 500 mg by mouth  daily as needed.      ondansetron (ZOFRAN ODT) 4 MG disintegrating tablet Take 1 tablet (4 mg total) by mouth every 8 (eight) hours as needed. 20 tablet 0    pantoprazole (PROTONIX) 40 MG tablet Take 1 tablet (40 mg total) by mouth daily. (Patient not taking: Reported on 01/29/2022) 30 tablet 0    prazosin (MINIPRESS) 2 MG capsule Take 4 mg by mouth at bedtime.      promethazine (PHENERGAN) 25 MG tablet Take 1 tablet (25 mg total) by mouth every 6 (six) hours as needed for nausea or vomiting. 30 tablet 0    SUMAtriptan (IMITREX) 50 MG tablet Take 50 mg by mouth every 2 (two) hours as needed.      SUMAtriptan (IMITREX) 50 MG tablet Take by mouth.      traZODone (DESYREL) 100 MG tablet Take 100 mg by mouth at bedtime.       Patient Stressors: Financial difficulties    Patient Strengths: Automotive engineer for treatment/growth   Treatment Modalities: Medication Management, Group therapy, Case management,  1 to 1 session with clinician, Psychoeducation, Recreational therapy.   Physician Treatment Plan for Primary Diagnosis: Major depressive disorder, recurrent severe without psychotic features (HCC) Long Term Goal(s): Improvement in symptoms so as ready for discharge   Short Term Goals: Ability to identify and develop effective coping behaviors will improve Ability to maintain clinical measurements within normal limits will improve Ability to verbalize feelings will improve Ability to disclose and discuss suicidal ideas  Medication Management: Evaluate patient's response, side effects, and tolerance of medication regimen.  Therapeutic Interventions: 1 to 1 sessions, Unit Group sessions and Medication administration.  Evaluation of Outcomes: Progressing  Physician Treatment Plan for Secondary Diagnosis: Principal Problem:   Major depressive disorder, recurrent severe without psychotic features (HCC) Active Problems:   PTSD (post-traumatic stress disorder)    Self-inflicted laceration of wrist (HCC)   Panic attack  Long Term Goal(s): Improvement in symptoms so as ready for discharge   Short Term Goals: Ability to identify and develop effective coping behaviors will improve Ability to maintain clinical measurements within normal limits will improve Ability to verbalize feelings will improve Ability to disclose and discuss suicidal ideas     Medication Management: Evaluate patient's response, side effects, and tolerance of medication regimen.  Therapeutic Interventions: 1 to 1 sessions, Unit Group sessions and Medication administration.  Evaluation of Outcomes: Progressing   RN  Treatment Plan for Primary Diagnosis: Major depressive disorder, recurrent severe without psychotic features ([place]) Long Term Goal(s): Knowledge of disease and therapeutic regimen to maintain health will improve  Short Term Goals: Ability to demonstrate self-control, Ability to participate in decision making will improve, Ability to verbalize feelings will improve, Ability to disclose and discuss suicidal ideas, Ability to identify and develop effective coping behaviors will improve, and Compliance with prescribed medications will improve  Medication Management: RN will administer medications as ordered by provider, will assess and evaluate patient's response and provide education to patient for prescribed medication. RN will report any adverse and/or side effects to prescribing provider.  Therapeutic Interventions: 1 on 1 counseling sessions, Psychoeducation, Medication administration, Evaluate responses to treatment, Monitor vital signs and CBGs as ordered, Perform/monitor CIWA, COWS, AIMS and Fall Risk screenings as ordered, Perform wound care treatments as ordered.  Evaluation of Outcomes: Progressing   LCSW Treatment Plan for Primary Diagnosis: Major depressive disorder, recurrent severe without psychotic features ([place]) Long Term Goal(s): Safe transition to  appropriate next level of care at discharge, Engage patient in therapeutic group addressing interpersonal concerns.  Short Term Goals: Engage patient in aftercare planning with referrals and resources, Increase social support, Increase ability to appropriately verbalize feelings, Increase emotional regulation, Facilitate acceptance of mental health diagnosis and concerns, and Increase skills for wellness and recovery  Therapeutic Interventions: Assess for all discharge needs, 1 to 1 time with Social worker, Explore available resources and support systems, Assess for adequacy in community support network, Educate family and significant other(s) on suicide prevention, Complete Psychosocial Assessment, Interpersonal group therapy.  Evaluation of Outcomes: Progressing   Progress in Treatment: Attending groups: Yes. Participating in groups: No. Taking medication as prescribed: Yes. Toleration medication: Yes. Family/Significant other contact made: Yes, individual(s) contacted:  SPE completed with the patient's mother.  Patient understands diagnosis: Yes. Discussing patient identified problems/goals with staff: Yes. Medical problems stabilized or resolved: Yes. Denies suicidal/homicidal ideation: Yes. Issues/concerns per patient self-inventory: No. Other: none  New problem(s) identified: No, Describe:  none  Update 07/02/2022:  No changes at this time.    New Short Term/Long Term Goal(s): elimination of symptoms of psychosis, medication management for mood stabilization; elimination of SI thoughts; development of comprehensive mental wellness/sobriety plan. Update 07/02/2022:  No changes at this time.    Patient Goals:  "I want to work on my anxiety and depression and get them under control.  And to try and avoid self-harm." Update 07/02/2022:  No changes at this time.    Discharge Plan or Barriers:  CSW to assist in the development of appropriate discharge plans. Update 07/02/2022:  Patient  reports plans to return to her home.  She has a current provider with [facility] and plans to continue services with them at this time.    Reason for Continuation of Hospitalization: Anxiety Depression Medication stabilization Suicidal ideation   Estimated Length of Stay:  1-7 days  Update 07/02/2022:  TBD    Last 3 [place] Suicide Severity Risk Score: Flowsheet Row Admission (Current) from 06/24/2022 in [place] Most recent reading at 06/24/2022  4:00 PM ED from 06/24/2022 in [facility] Emergency Department at [facility] Most recent reading at 06/24/2022  8:30 AM ED from 01/29/2022 in [facility] at [facility]  Most recent reading at 01/29/2022 12:10 PM  C-SSRS RISK CATEGORY High Risk No Risk No Risk       Last PHQ 2/9 Scores:     No data to  display          Scribe for Treatment Team: Rozann Lesches, Marlinda Mike 07/02/2022 9:11 AM

## 2022-07-02 NOTE — BHH Suicide Risk Assessment (Signed)
[facility] Discharge Suicide Risk Assessment   Principal Problem: Major depressive disorder, recurrent severe without psychotic features ([place]) Discharge Diagnoses: Principal Problem:   Major depressive disorder, recurrent severe without psychotic features ([place]) Active Problems:   PTSD (post-traumatic stress disorder)   Self-inflicted laceration of wrist (HCC)   Panic attack   Total Time spent with patient: 30 minutes  Musculoskeletal: Strength & Muscle Tone: within normal limits Gait & Station: normal Patient leans: N/A  Psychiatric Specialty Exam  Presentation  General Appearance: No data recorded Eye Contact:No data recorded Speech:No data recorded Speech Volume:No data recorded Handedness:No data recorded  Mood and Affect  Mood:No data recorded Duration of Depression Symptoms: Greater than two weeks  Affect:No data recorded  Thought Process  Thought Processes:No data recorded Descriptions of Associations:No data recorded Orientation:No data recorded Thought Content:No data recorded History of Schizophrenia/Schizoaffective disorder:No  Duration of Psychotic Symptoms:No data recorded Hallucinations:No data recorded Ideas of Reference:No data recorded Suicidal Thoughts:No data recorded Homicidal Thoughts:No data recorded  Sensorium  Memory:No data recorded Judgment:No data recorded Insight:No data recorded  Executive Functions  Concentration:No data recorded Attention Span:No data recorded Recall:No data recorded Fund of Knowledge:No data recorded Language:No data recorded  Psychomotor Activity  Psychomotor Activity:No data recorded  Assets  Assets:No data recorded  Sleep  Sleep:No data recorded  Physical Exam: Physical Exam Vitals and nursing note reviewed.  Constitutional:      Appearance: Normal appearance.  HENT:     Head: Normocephalic and atraumatic.     Mouth/Throat:     Pharynx: Oropharynx is clear.  Eyes:     Pupils: Pupils are equal,  round, and reactive to light.  Cardiovascular:     Rate and Rhythm: Normal rate and regular rhythm.  Pulmonary:     Effort: Pulmonary effort is normal.     Breath sounds: Normal breath sounds.  Abdominal:     General: Abdomen is flat.     Palpations: Abdomen is soft.  Musculoskeletal:        General: Normal range of motion.  Skin:    General: Skin is warm and dry.  Neurological:     General: No focal deficit present.     Mental Status: She is alert. Mental status is at baseline.  Psychiatric:        Attention and Perception: Attention normal.        Mood and Affect: Mood normal.        Speech: Speech normal.        Behavior: Behavior normal.        Thought Content: Thought content normal.        Cognition and Memory: Cognition normal.        Judgment: Judgment normal.    Review of Systems  Constitutional: Negative.   HENT: Negative.    Eyes: Negative.   Respiratory: Negative.    Cardiovascular: Negative.   Gastrointestinal: Negative.   Musculoskeletal: Negative.   Skin: Negative.   Neurological: Negative.   Psychiatric/Behavioral: Negative.     Blood pressure 101/65, pulse 63, temperature 98.6 F (37 C), temperature source Oral, resp. rate 19, height 4\' 11"  (1.499 m), weight 75.8 kg, SpO2 99 %. Body mass index is 33.73 kg/m.  Mental Status Per Nursing Assessment::   On Admission:  Suicidal ideation indicated by patient, Self-harm thoughts, Self-harm behaviors  Demographic Factors:  Caucasian  Loss Factors: NA  Historical Factors: Impulsivity  Risk Reduction Factors:   Sense of responsibility to family, Positive social support, Positive therapeutic relationship, and  Positive coping skills or problem solving skills  Continued Clinical Symptoms:  Bipolar Disorder:   Mixed State  Cognitive Features That Contribute To Risk:  None    Suicide Risk:  Minimal: No identifiable suicidal ideation.  Patients presenting with no risk factors but with morbid  ruminations; may be classified as minimal risk based on the severity of the depressive symptoms    Plan Of Care/Follow-up recommendations:  Other:  Patient has been calm throughout her hospital stay especially the last few days.  She denies any suicidal ideation.  Reports that anxiety is much improved.  Has positive plans for the future.  Feels comfortable with current medicine.  Psychoeducation provided and patient will be given prescriptions and referred to her outpatient provider.  Alethia Berthold, MD 07/02/2022, 10:24 AM

## 2022-07-02 NOTE — Progress Notes (Signed)
  [facility] Adult Case Management Discharge Plan :  Will you be returning to the same living situation after discharge:  Yes,  pt reports that she is returning home. At discharge, do you have transportation home?: Yes,  pt reports that family will provide transportation. Do you have the ability to pay for your medications: Yes,  [employer] / Charmian Muff DUAL COMPLETE  Release of information consent forms completed and in the chart;  Patient's signature needed at discharge.  Patient to Follow up at:  Follow-up [place], [place].. Call.   Why: You have a virtual appointment scheduled for tomorrow, 07/03/22 at [place]. Thanks! Contact information: [address]  [place],  [address] Phone: [phone]                Next level of care provider has access to [place] and Suicide Prevention discussed: Yes,  SPE completed with the patient and patient's mother.     Has patient been referred to the Quitline?: Patient refused referral  Patient has been referred for addiction treatment: Pt. refused referral  Rozann Lesches, LCSW 07/02/2022, 11:15 AM

## 2022-07-02 NOTE — Plan of Care (Signed)
  Problem: Education: Goal: Knowledge of General Education information will improve Description: Including pain rating scale, medication(s)/side effects and non-pharmacologic comfort measures Outcome: Adequate for Discharge   Problem: Coping: Goal: Level of anxiety will decrease Outcome: Adequate for Discharge   Problem: Coping: Goal: Coping ability will improve Outcome: Adequate for Discharge   Problem: Health Behavior/Discharge Planning: Goal: Ability to make decisions will improve Outcome: Adequate for Discharge   Problem: Education: Goal: Ability to make informed decisions regarding treatment will improve Outcome: Adequate for Discharge

## 2022-09-13 ENCOUNTER — Encounter: Payer: Self-pay | Admitting: Emergency Medicine

## 2022-09-13 ENCOUNTER — Emergency Department
Admission: EM | Admit: 2022-09-13 | Discharge: 2022-09-13 | Disposition: A | Discharge status: Home / Self Care | Payer: 59 | Attending: Emergency Medicine | Admitting: Emergency Medicine

## 2022-09-13 ENCOUNTER — Other Ambulatory Visit: Payer: Self-pay

## 2022-09-13 DIAGNOSIS — F603 Borderline personality disorder: Secondary | ICD-10-CM | POA: Diagnosis present

## 2022-09-13 DIAGNOSIS — E119 Type 2 diabetes mellitus without complications: Secondary | ICD-10-CM | POA: Diagnosis not present

## 2022-09-13 DIAGNOSIS — F410 Panic disorder [episodic paroxysmal anxiety] without agoraphobia: Secondary | ICD-10-CM | POA: Diagnosis present

## 2022-09-13 DIAGNOSIS — F332 Major depressive disorder, recurrent severe without psychotic features: Secondary | ICD-10-CM | POA: Diagnosis not present

## 2022-09-13 DIAGNOSIS — F419 Anxiety disorder, unspecified: Secondary | ICD-10-CM | POA: Diagnosis not present

## 2022-09-13 LAB — COMPREHENSIVE METABOLIC PANEL
ALT: 34 U/L (ref 0–44)
AST: 32 U/L (ref 15–41)
Albumin: 4.5 g/dL (ref 3.5–5.0)
Alkaline Phosphatase: 61 U/L (ref 38–126)
Anion gap: 10 (ref 5–15)
BUN: 12 mg/dL (ref 6–20)
CO2: 20 mmol/L — ABNORMAL LOW (ref 22–32)
Calcium: 9.5 mg/dL (ref 8.9–10.3)
Chloride: 107 mmol/L (ref 98–111)
Creatinine, Ser: 0.7 mg/dL (ref 0.44–1.00)
GFR, Estimated: >60 mL/min (ref >60)
Glucose, Bld: 136 mg/dL — ABNORMAL HIGH (ref 70–99)
Potassium: 3.8 mmol/L (ref 3.5–5.1)
Sodium: 137 mmol/L (ref 135–145)
Total Bilirubin: 0.6 mg/dL (ref 0.3–1.2)
Total Protein: 8.1 g/dL (ref 6.5–8.1)

## 2022-09-13 LAB — CBC WITH DIFFERENTIAL/PLATELET
Abs Immature Granulocytes: 0.07 10*3/uL (ref 0.00–0.07)
Basophils Absolute: 0.1 10*3/uL (ref 0.0–0.1)
Basophils Relative: 0 %
Eosinophils Absolute: 0.1 10*3/uL (ref 0.0–0.5)
Eosinophils Relative: 1 %
HCT: 40.4 % (ref 36.0–46.0)
Hemoglobin: 13.5 g/dL (ref 12.0–15.0)
Immature Granulocytes: 1 %
Lymphocytes Relative: 28 %
Lymphs Abs: 3.7 10*3/uL (ref 0.7–4.0)
MCH: 27.6 pg (ref 26.0–34.0)
MCHC: 33.4 g/dL (ref 30.0–36.0)
MCV: 82.6 fL (ref 80.0–100.0)
Monocytes Absolute: 0.6 10*3/uL (ref 0.1–1.0)
Monocytes Relative: 5 %
Neutro Abs: 8.5 10*3/uL — ABNORMAL HIGH (ref 1.7–7.7)
Neutrophils Relative %: 65 %
Platelets: 404 10*3/uL — ABNORMAL HIGH (ref 150–400)
RBC: 4.89 MIL/uL (ref 3.87–5.11)
RDW: 14.9 % (ref 11.5–15.5)
WBC: 13.1 10*3/uL — ABNORMAL HIGH (ref 4.0–10.5)
nRBC: 0 % (ref 0.0–0.2)

## 2022-09-13 LAB — POC URINE PREG, ED: Preg Test, Ur: NEGATIVE

## 2022-09-13 LAB — TROPONIN I (HIGH SENSITIVITY): Troponin I (High Sensitivity): 3 ng/L (ref <18)

## 2022-09-13 LAB — URINE DRUG SCREEN, QUALITATIVE (ARMC ONLY)
Amphetamines, Ur Screen: NOT DETECTED
Barbiturates, Ur Screen: NOT DETECTED
Benzodiazepine, Ur Scrn: POSITIVE — AB
Cannabinoid 50 Ng, Ur ~~LOC~~: POSITIVE — AB
Cocaine Metabolite,Ur ~~LOC~~: NOT DETECTED
MDMA (Ecstasy)Ur Screen: NOT DETECTED
Methadone Scn, Ur: NOT DETECTED
Opiate, Ur Screen: NOT DETECTED
Phencyclidine (PCP) Ur S: NOT DETECTED
Tricyclic, Ur Screen: POSITIVE — AB

## 2022-09-13 LAB — ETHANOL: Alcohol, Ethyl (B): <10 mg/dL (ref <10)

## 2022-09-13 LAB — ACETAMINOPHEN LEVEL: Acetaminophen (Tylenol), Serum: <10 ug/mL — ABNORMAL LOW (ref 10–30)

## 2022-09-13 LAB — SALICYLATE LEVEL: Salicylate Lvl: <7 mg/dL — ABNORMAL LOW (ref 7.0–30.0)

## 2022-09-13 MED ORDER — LORAZEPAM 1 MG PO TABS
1.0000 mg | ORAL_TABLET | Freq: Once | ORAL | Status: AC
  Administered 2022-09-13: 1 mg via ORAL
  Filled 2022-09-13: qty 1

## 2022-09-13 MED ORDER — HYDROXYZINE HCL 25 MG PO TABS
25.0000 mg | ORAL_TABLET | Freq: Once | ORAL | Status: AC
  Administered 2022-09-13: 25 mg via ORAL
  Filled 2022-09-13: qty 1

## 2022-09-13 MED ORDER — ONDANSETRON 4 MG PO TBDP
4.0000 mg | ORAL_TABLET | Freq: Once | ORAL | Status: AC
  Administered 2022-09-13: 4 mg via ORAL
  Filled 2022-09-13: qty 1

## 2022-09-13 NOTE — Consult Note (Addendum)
[facility] Consult   Reason for Consult:  anxiety Referring Physician:  Modesto CharonWong Patient Identification: Natalie Rose MRN:  [id number] Principal Diagnosis: Panic attack Diagnosis:  Principal Problem:   Panic attack Active Problems:   Borderline personality disorder   MDD (major depressive disorder), recurrent severe, without psychosis   Total Time spent with patient: 45 minutes  Subjective:  "My chest was really hurting and I got scared and called 911." Natalie Rose is a 42 y.o. female patient admitted with anxiety, "panic attack."   HPI:  Patient presents voluntarily to the ED via EMS. Patient reports that she called EMS because her chest was really hurting during a panic attack.    On evaluation, patient is somewhat tearful at times. She denies thought or intent of suicide. She has superficial scratches on left forearm, where she says she scratched herself with her fingernails for a "relief" of some of the anxiety. (Patient did this while in the ED). Patient denies homicidal thoughts, auditory or visual hallucinations. Perceptions intact. She speaks in clear, coherent sentences. She describes her symptoms of panic attack as "feeling like I can't breathe, heaviness in chest." At time of evaluation she did not present with acute panic symptoms. She reports that she sometimes wakes in her sleep. Patient reports that she was feeling stable when she was  discharged from an inpatient psychiatric stay  (Jan 21- Jul 02, 2022). She is followed by [facility]. She reports her quetiapine was reduced to twice daily from 3 times daily.  Patient reports nothing specific that makes her anxiety worse, although she said she feels it has increased the last 3 days. She says she worries about general life stressors, her mom's health, and financial strain.   She continues to use marijuana but denies other illicit substances.  Denies alcohol use.  Hospitalization is unlikely to be of benefit to patient at this time. Discussed at length the importance of getting into seeing a therapist to learn coping/grounding techniques to help control her anxiety. Patient states that her provider Zorita Pang(Julie McClure) has put in a referral but no appointment yet. Provider called provider's office and verified this. That office does not have therapists. Writer was advised that patient has an appointment tomorrow (4/12) at 10:40 with Zorita PangJulie McClure. Patient advised to return to ED if she has worsening symptoms prior to seeing her outpatient provider. Advised to stay with her mother today/tonight. Patient lives alone.    Past Psychiatric History: Per chart review, borderline personality disorder, panic attacks, MDD; Denies any suicide attempts  Risk to Self:   Risk to Others:   Prior Inpatient Therapy:   Prior Outpatient Therapy:    Past Medical History:  Past Medical History:  Diagnosis Date   Anxiety    Arthritis    Depression    Diabetes mellitus without complication    Mental health disorder    Migraines     Past Surgical History:  Procedure Laterality Date   MOUTH SURGERY     Family History:  Family History  Problem Relation Age of Onset   Osteopenia Mother    Heart failure Mother    Osteoarthritis Mother    Depression Mother    Diabetes Father    Family Psychiatric  History:  Social History:  Social History   Substance and Sexual Activity  Alcohol Use Not Currently     Social History   Substance and Sexual Activity  Drug Use Yes  Frequency: 3.0 times per week   Types: Marijuana    Social History   Socioeconomic History   Marital status: Single    Spouse name: Not on file   Number of children: Not on file   Years of education: Not on file   Highest education level: Not on file  Occupational History   Not on file  Tobacco Use   Smoking status: Every Day    Packs/day: 1    Types: Cigarettes    Smokeless tobacco: Never  Vaping Use   Vaping Use: Never used  Substance and Sexual Activity   Alcohol use: Not Currently   Drug use: Yes    Frequency: 3.0 times per week    Types: Marijuana   Sexual activity: Not Currently    Birth control/protection: None  Other Topics Concern   Not on file  Social History Narrative   Not on file   Social Determinants of Health   Financial Resource Strain: Not on file  Food Insecurity: Food Insecurity Present (06/24/2022)   Hunger Vital Sign    Worried About Running Out of Food in the Last Year: Sometimes true    Ran Out of Food in the Last Year: Often true  Transportation Needs: Unmet Transportation Needs (06/24/2022)   PRAPARE - Administrator, Civil Service (Medical): Yes    Lack of Transportation (Non-Medical): Yes  Physical Activity: Not on file  Stress: Not on file  Social Connections: Not on file   Additional Social History:    Allergies:   Allergies  Allergen Reactions   Diazepam Hives, Anxiety, Itching, Other (See Comments) and Rash    unknown unknown    Lurasidone     Labs:  Results for orders placed or performed during the hospital encounter of 09/13/22 (from the past 48 hour(s))  Comprehensive metabolic panel     Status: Abnormal   Collection Time: 09/13/22  9:10 AM  Result Value Ref Range   Sodium 137 135 - 145 mmol/L   Potassium 3.8 3.5 - 5.1 mmol/L   Chloride 107 98 - 111 mmol/L   CO2 20 (L) 22 - 32 mmol/L   Glucose, Bld 136 (H) 70 - 99 mg/dL    Comment: Glucose reference range applies only to samples taken after fasting for at least 8 hours.   BUN 12 6 - 20 mg/dL   Creatinine, Ser 4.09 0.44 - 1.00 mg/dL   Calcium 9.5 8.9 - 73.5 mg/dL   Total Protein 8.1 6.5 - 8.1 g/dL   Albumin 4.5 3.5 - 5.0 g/dL   AST 32 15 - 41 U/L   ALT 34 0 - 44 U/L   Alkaline Phosphatase 61 38 - 126 U/L   Total Bilirubin 0.6 0.3 - 1.2 mg/dL   GFR, Estimated >32 >99 mL/min    Comment: (NOTE) Calculated using the CKD-EPI  Creatinine Equation (2021)    Anion gap 10 5 - 15    Comment: Performed at [facility], [address] [place], [place] [address]  Ethanol     Status: None   Collection Time: 09/13/22  9:10 AM  Result Value Ref Range   Alcohol, Ethyl (B) <10 <10 mg/dL    Comment: (NOTE) Lowest detectable limit for serum alcohol is 10 mg/dL.  For medical purposes only. Performed at [facility], [address], [place], [place] [address]   Salicylate level     Status: Abnormal   Collection Time: 09/13/22  9:10 AM  Result Value  Ref Range   Salicylate Lvl <7.0 (L) 7.0 - 30.0 mg/dL    Comment: Performed at [facility], [address] [place], [place] [address]  Troponin I (High Sensitivity)     Status: None   Collection Time: 09/13/22  9:10 AM  Result Value Ref Range   Troponin I (High Sensitivity) 3 <18 ng/L    Comment: (NOTE) Elevated high sensitivity troponin I (hsTnI) values and significant  changes across serial measurements may suggest ACS but many other  chronic and acute conditions are known to elevate hsTnI results.  Refer to the "Links" section for chest pain algorithms and additional  guidance. Performed at [facility], [address] [place], [place] [address]   Acetaminophen level     Status: Abnormal   Collection Time: 09/13/22  9:10 AM  Result Value Ref Range   Acetaminophen (Tylenol), Serum <10 (L) 10 - 30 ug/mL    Comment: (NOTE) Therapeutic concentrations vary significantly. A range of 10-30 ug/mL  may be an effective concentration for many patients. However, some  are best treated at concentrations outside of this range. Acetaminophen concentrations >150 ug/mL at 4 hours after ingestion  and >50 ug/mL at 12 hours after ingestion are often associated with  toxic reactions.  Performed at [facility], [address] [place], [place] [address]   CBC with Differential     Status: Abnormal   Collection Time: 09/13/22   9:10 AM  Result Value Ref Range   WBC 13.1 (H) 4.0 - 10.5 K/uL   RBC 4.89 3.87 - 5.11 MIL/uL   Hemoglobin 13.5 12.0 - 15.0 g/dL   HCT 90.2 40.9 - 73.5 %   MCV 82.6 80.0 - 100.0 fL   MCH 27.6 26.0 - 34.0 pg   MCHC 33.4 30.0 - 36.0 g/dL   RDW 32.9 92.4 - 26.8 %   Platelets 404 (H) 150 - 400 K/uL   nRBC 0.0 0.0 - 0.2 %   Neutrophils Relative % 65 %   Neutro Abs 8.5 (H) 1.7 - 7.7 K/uL   Lymphocytes Relative 28 %   Lymphs Abs 3.7 0.7 - 4.0 K/uL   Monocytes Relative 5 %   Monocytes Absolute 0.6 0.1 - 1.0 K/uL   Eosinophils Relative 1 %   Eosinophils Absolute 0.1 0.0 - 0.5 K/uL   Basophils Relative 0 %   Basophils Absolute 0.1 0.0 - 0.1 K/uL   Immature Granulocytes 1 %   Abs Immature Granulocytes 0.07 0.00 - 0.07 K/uL    Comment: Performed at [facility], [address] [place], [place] [address]  POC urine preg, ED     Status: None   Collection Time: 09/13/22  9:26 AM  Result Value Ref Range   Preg Test, Ur NEGATIVE NEGATIVE    Comment:        THE SENSITIVITY OF THIS METHODOLOGY IS >24 mIU/mL   Urine Drug Screen, Qualitative     Status: Abnormal   Collection Time: 09/13/22 10:10 AM  Result Value Ref Range   Tricyclic, Ur Screen POSITIVE (A) NONE DETECTED   Amphetamines, Ur Screen NONE DETECTED NONE DETECTED   MDMA (Ecstasy)Ur Screen NONE DETECTED NONE DETECTED   Cocaine Metabolite,Ur [place] NONE DETECTED NONE DETECTED   Opiate, Ur Screen NONE DETECTED NONE DETECTED   Phencyclidine (PCP) Ur S NONE DETECTED NONE DETECTED   Cannabinoid 50 Ng, Ur [place] POSITIVE (A) NONE DETECTED   Barbiturates, Ur Screen NONE DETECTED NONE DETECTED   Benzodiazepine, Ur Scrn  POSITIVE (A) NONE DETECTED   Methadone Scn, Ur NONE DETECTED NONE DETECTED    Comment: (NOTE) Tricyclics + metabolites, urine    Cutoff 1000 ng/mL Amphetamines + metabolites, urine  Cutoff 1000 ng/mL MDMA (Ecstasy), urine              Cutoff 500 ng/mL Cocaine Metabolite, urine          Cutoff 300 ng/mL Opiate +  metabolites, urine        Cutoff 300 ng/mL Phencyclidine (PCP), urine         Cutoff 25 ng/mL Cannabinoid, urine                 Cutoff 50 ng/mL Barbiturates + metabolites, urine  Cutoff 200 ng/mL Benzodiazepine, urine              Cutoff 200 ng/mL Methadone, urine                   Cutoff 300 ng/mL  The urine drug screen provides only a preliminary, unconfirmed analytical test result and should not be used for non-medical purposes. Clinical consideration and professional judgment should be applied to any positive drug screen result due to possible interfering substances. A more specific alternate chemical method must be used in order to obtain a confirmed analytical result. Gas chromatography / mass spectrometry (GC/MS) is the preferred confirm atory method. Performed at [facility], [address] [place], [place] [address]     No current facility-administered medications for this encounter.   Current Outpatient Medications  Medication Sig Dispense Refill   hydrOXYzine (ATARAX) 50 MG tablet Take 1 tablet (50 mg total) by mouth 3 (three) times daily as needed. 60 tablet 1   metFORMIN (GLUCOPHAGE) 500 MG tablet Take 1 tablet (500 mg total) by mouth 2 (two) times daily with a meal. 60 tablet 1   mirtazapine (REMERON) 45 MG tablet Take 1 tablet (45 mg total) by mouth at bedtime. 30 tablet 1   nicotine (NICODERM CQ - DOSED IN MG/24 HOURS) 14 mg/24hr patch Place 1 patch (14 mg total) onto the skin daily. 28 patch 1   OLANZapine (ZYPREXA) 10 MG tablet Take 1 tablet (10 mg total) by mouth at bedtime. 30 tablet 1   ondansetron (ZOFRAN ODT) 4 MG disintegrating tablet Take 1 tablet (4 mg total) by mouth every 8 (eight) hours as needed. 30 tablet 1   prazosin (MINIPRESS) 2 MG capsule Take 2 capsules (4 mg total) by mouth at bedtime. 60 capsule 1   promethazine (PHENERGAN) 25 MG tablet Take 1 tablet (25 mg total) by mouth every 6 (six) hours as needed for nausea or vomiting. 30 tablet  1   propranolol (INDERAL) 10 MG tablet Take 1 tablet (10 mg total) by mouth 3 (three) times daily. 90 tablet 1   QUEtiapine (SEROQUEL) 50 MG tablet Take 1 tablet (50 mg total) by mouth 3 (three) times daily. 90 tablet 1   sertraline (ZOLOFT) 50 MG tablet Take 3 tablets (150 mg total) by mouth daily. 90 tablet 1   SUMAtriptan (IMITREX) 50 MG tablet Take 50 mg by mouth every 2 (two) hours as needed.     traZODone (DESYREL) 100 MG tablet Take 1 tablet (100 mg total) by mouth at bedtime. 30 tablet 1    Musculoskeletal: Strength & Muscle Tone: within normal limits Gait & Station: normal Patient leans: N/A    Psychiatric Specialty Exam:  Presentation  General Appearance: Casual  Eye Contact:Good  Speech:Clear and  Coherent  Speech Volume:Normal  Handedness:No data recorded  Mood and Affect  Mood:Anxious  Affect:Congruent   Thought Process  Thought Processes:Coherent  Descriptions of Associations:Intact  Orientation:Full (Time, Place and Person)  Thought Content:WDL  History of Schizophrenia/Schizoaffective disorder:No  Duration of Psychotic Symptoms:No data recorded Hallucinations:Hallucinations: None  Ideas of Reference:None  Suicidal Thoughts:Suicidal Thoughts: No  Homicidal Thoughts:Homicidal Thoughts: No   Sensorium  Memory:Immediate Good  Judgment:Fair  Insight:Fair   Executive Functions  Concentration:Good  Attention Span:Good  Recall:Good  Fund of Knowledge:Good  Language:Good   Psychomotor Activity  Psychomotor Activity:Psychomotor Activity: Normal   Assets  Assets:Desire for Improvement   Sleep  Sleep:Sleep: Fair   Physical Exam: Physical Exam Vitals and nursing note reviewed.  HENT:     Head: Normocephalic.     Nose: No congestion or rhinorrhea.  Eyes:     General:        Right eye: No discharge.        Left eye: No discharge.  Cardiovascular:     Rate and Rhythm: Normal rate.  Pulmonary:     Effort: Pulmonary  effort is normal.  Musculoskeletal:        General: Normal range of motion.     Cervical back: Normal range of motion.  Skin:    General: Skin is dry.  Neurological:     Mental Status: She is alert and oriented to person, place, and time.  Psychiatric:        Attention and Perception: Attention normal.        Mood and Affect: Mood is anxious.        Speech: Speech normal.        Behavior: Behavior normal. Behavior is cooperative.        Thought Content: Thought content is not paranoid or delusional. Thought content does not include homicidal or suicidal ideation.        Cognition and Memory: Cognition normal.        Judgment: Judgment normal.    Review of Systems  Constitutional: Negative.   HENT: Negative.    Cardiovascular: Negative.        No c/o chest pain during evaluation   Musculoskeletal: Negative.   Skin:        Superficial scratches left forearm.   Neurological: Negative.   Psychiatric/Behavioral:  Positive for depression and substance abuse. Negative for hallucinations, memory loss and suicidal ideas. The patient is nervous/anxious. The patient does not have insomnia.    Blood pressure (!) 156/101, pulse 90, temperature 98.1 F (36.7 C), temperature source Oral, resp. rate 18, SpO2 99 %. There is no height or weight on file to calculate BMI.  Treatment Plan Summary: Patient instructed to  take the quetiapine 3 times today, as she was prescribed at discharge from this hospital in Jan 2024, instead of the 2 times that outpatient provider dropped it down to. Follow outpatient provider's instructions when she sees her tomorrow. Advised to stay with her mother today Reviewed with Dr. Modesto Charon. Return to ED if symptoms worsen or she has thought or plan of suicide.   Disposition: No evidence of imminent risk to self or others at present.   Supportive therapy provided about ongoing stressors. Discussed crisis plan, support from social network, calling 911, coming to the Emergency  Department, and calling Suicide Hotline.  Vanetta Mulders, NP 09/13/2022 11:02 AM

## 2022-09-13 NOTE — ED Notes (Signed)
Patient Items:  Only 1 BAG  [employer] Tennis Shoes Green Pantie [employer] Pajama Pants [place] Light Wallace Cullens Bra Dark Wallace Cullens T-Shirt [employer] Cell Phone Jewelry- Silver Necklace and Freight forwarder

## 2022-09-13 NOTE — ED Provider Notes (Addendum)
[facility] [place] [facility] Provider Note    Event Date/Time   First MD Initiated Contact with Patient 09/13/22 [phone]     (approximate)   History   Panic Attack   HPI  Natalie Rose is a 42 y.o. female   Past medical history of anxiety and panic attacks, PTSD, borderline personality, cannabis use, depression, diabetes, migraines and arthritis who presents with panic attacks.  Worsening over the last 3 days.  Ongoing marijuana use but denies other drugs or alcohol.  Has been using Ativan and propranolol without much improvement.  Compliant with her other psychiatric medications.  Denies self-harm, other ingestions, suicidality but has intrusive thoughts about harming herself vaguely.  She denies homicidality or hallucination.  She has chest pressure w her panic attacks last night. None currently.   She has no other acute medical complaints.  Independent historian-EMS  External Medical Documents Reviewed: Discharge summary from psychiatry dated 07/02/2022 with worsening anxiety/depression self injury and some suicidal ideation with mood instability.      Physical Exam   Triage Vital Signs: ED Triage Vitals  Enc Vitals Group     BP      Pulse      Resp      Temp      Temp src      SpO2      Weight      Height      Head Circumference      Peak Flow      Pain Score      Pain Loc      Pain Edu?      Excl. in GC?     Most recent vital signs: Vitals:   09/13/22 0906  BP: (!) 156/101  Pulse: 90  Resp: 18  Temp: 98.1 F (36.7 C)  SpO2: 99%    General: Awake, no distress.  CV:  Good peripheral perfusion.  Resp:  Normal effort.  Abd:  No distention.  Other:  Anxious appearing hypertensive 150/100 otherwise vital signs within normal limit.  No signs of acute intoxication nor withdrawal, motor or sensory normal, lungs clear, abdomen soft and nontender, no obvious signs of trauma.  ED Results / Procedures / Treatments   Labs (all labs ordered  are listed, but only abnormal results are displayed) Labs Reviewed  COMPREHENSIVE METABOLIC PANEL  ETHANOL  SALICYLATE LEVEL  ACETAMINOPHEN LEVEL  CBC WITH DIFFERENTIAL/PLATELET  URINE DRUG SCREEN, QUALITATIVE ([facility] ONLY)  POC URINE PREG, ED  POC URINE PREG, ED  TROPONIN I (HIGH SENSITIVITY)     I ordered and reviewed the above labs they are notable for negative pregnancy test  EKG  ED ECG REPORT I, Pilar Jarvis, the attending physician, personally viewed and interpreted this ECG.   Date: 09/13/2022  EKG Time: 0907  Rate: 76  Rhythm: nsr  Axis: nl  Intervals:none  ST&T Change: no acute ischemic changes    PROCEDURES:  Critical Care performed: No  Procedures   MEDICATIONS ORDERED IN ED: Medications  hydrOXYzine (ATARAX) tablet 25 mg (has no administration in time range)   IMPRESSION / MDM / ASSESSMENT AND PLAN / ED COURSE  I reviewed the triage vital signs and the nursing notes.                                Patient's presentation is most consistent with acute presentation with potential threat to life or bodily function.  Differential  diagnosis includes, but is not limited to, suicidal ideation, acute decompensated psychiatric illness, anxiety, panic attack, electrolyte disturbance, arrhythmia, cardiac ischemia   The patient is on the cardiac monitor to evaluate for evidence of arrhythmia and/or significant heart rate changes.  MDM: Patient with history of anxiety PTSD and panic attacks presents with the same.  Has been using her outpatient medications without effect.  No signs of acute withdrawal.  Last Ativan was approximately 2 hours ago no alcohol use.  She has chest pressure along with her panic attacks which is minimal now, EKG nonischemic will check serial troponins to rule out cardiac ischemia.   Obtain basic labs as well as toxicologic labs and give hydroxyzine for anxiety and have psychiatric evaluation.  Voluntary.       FINAL CLINICAL  IMPRESSION(S) / ED DIAGNOSES   Final diagnoses:  Anxiety     Rx / DC Orders   ED Discharge Orders     None        Note:  This document was prepared using Dragon voice recognition software and may include unintentional dictation errors.    Pilar Jarvis, MD 09/13/22 0930    Pilar Jarvis, MD 09/13/22 1011

## 2022-09-13 NOTE — ED Triage Notes (Signed)
Pt presents via acems with c/o panic attack. Pt reports ongoing panic attacks for last 3 days. Pt reports taking propranolol and ativan with no relief. Pt endorses passive SI with no plan. Pt denies AH/VH/HI. Pt reports she "doesn't want to kill herself, just hurt herself".

## 2022-09-13 NOTE — ED Notes (Signed)
Pt scratching arm superficially- reports "I can't stop I'm sorry". MD ordered more PO medications to help with patient anxiety at this time. Psych team at bedside at this time.

## 2022-09-13 NOTE — ED Notes (Signed)
Urine cup given,waiting on urine at this time.

## 2022-09-13 NOTE — ED Notes (Signed)
Belongings bag 1/1 returned to patient at this time. 

## 2022-09-13 NOTE — ED Notes (Signed)
E-signature not working at this time. Pt verbalized understanding of D/C instructions, prescriptions and follow up care with no further questions at this time. Pt in NAD and ambulatory at time of D/C.  

## 2022-09-13 NOTE — Discharge Instructions (Signed)
You have an appointment tomorrow (09/14/22) with Natalie Rose at 10:40.   It is important that you follow up for medication management. It is very important for you to  get in to see a therapist who can help you learn techniques to manage your anxiety.

## 2022-09-13 NOTE — ED Notes (Signed)
Psych at bedside.

## 2022-09-13 NOTE — BH Assessment (Signed)
Comprehensive Clinical Assessment (CCA) Screening, Triage and Referral Note  09/13/2022 Natalie Rose [id number]  Chief Complaint:  Chief Complaint  Patient presents with   Panic Attack   Visit Diagnosis: Panic Attack  Andy Gauss. Strupp is a 42 year old female who presents to the ER due to increase symptoms of anxiety. She reports of having thoughts of self-harm but upon further evaluation, she's not having any thoughts of ending her life or anyone else's. She defines self-harm as in scratching her arm, with her fingernail. She denies having the thoughts and intentions of using any sharp objects to cut herself. Throughout interview, the patient denied SI/HI and AV/H. Psych Team spoke patient's outpatient clinic, "[facility]," and informed them of the patient. She already had a routine appointment schedule for tomorrow (09/14/2022). Patient feels safe to discharge and follow up tomorrow with her psychiatric provider.  Patient Reported Information How did you hear about [place]? Self  What Is the Reason for Your Visit/Call Today? Have increase anxiety.  How Long Has This Been Causing You Problems? 1 wk - 1 month  What Do You Feel Would Help You the Most Today? Treatment for Depression or other mood problem   Have You Recently Had Any Thoughts About Hurting Yourself? Yes (Self harm, not SI.)  Are You Planning to Commit Suicide/Harm Yourself At This time? No   Have you Recently Had Thoughts About Hurting Someone [name]? No  Are You Planning to Harm Someone at This Time? No  Explanation: No data recorded  Have You Used Any Alcohol or Drugs in the Past 24 Hours? No  How Long Ago Did You Use Drugs or Alcohol? No data recorded What Did You Use and How Much? No data recorded  Do You Currently Have a Therapist/Psychiatrist? Yes  Name of Therapist/Psychiatrist: [facility]   Have You Been Recently Discharged From Any Office Practice or Programs? No  Explanation of  Discharge From Practice/Program: No data recorded   CCA Screening Triage Referral Assessment Type of Contact: Face-to-Face  Telemedicine Service Delivery:   Is this Initial or Reassessment?   Date Telepsych consult ordered in [facility]:    Time Telepsych consult ordered in [facility]:    Location of Assessment: [facility] ED  Provider Location: [facility] ED    Collateral Involvement: [facility], outpatient provider.   Does Patient Have a Automotive engineer Guardian? No data recorded Name and Contact of Legal Guardian: No data recorded If Minor and Not Living with Parent(s), Who has Custody? No data recorded Is CPS involved or ever been involved? Never  Is APS involved or ever been involved? Never   Patient Determined To Be At Risk for Harm To Self or Others Based on Review of Patient Reported Information or Presenting Complaint? No  Method: No data recorded Availability of Means: No data recorded Intent: No data recorded Notification Required: No data recorded Additional Information for Danger to Others Potential: No data recorded Additional Comments for Danger to Others Potential: No data recorded Are There Guns or Other Weapons in Your Home? No  Types of Guns/Weapons: No data recorded Are These Weapons Safely Secured?                            No data recorded Who Could Verify You Are Able To Have These Secured: No data recorded Do You Have any Outstanding Charges, Pending Court Dates, Parole/Probation? No data recorded Contacted To Inform of Risk of Harm To Self or  Others: No data recorded  Does Patient Present under Involuntary Commitment? No   County of Residence: [place]   Patient Currently Receiving the Following Services: Medication Management   Determination of Need: Emergent (2 hours)   Options For Referral: Outpatient Therapy   Discharge Disposition:    Lilyan Gilford MS, LCAS, [facility], [facility] Therapeutic Triage Specialist 09/13/2022 11:47 AM

## 2022-09-14 ENCOUNTER — Emergency Department (EMERGENCY_DEPARTMENT_HOSPITAL)
Admission: EM | Admit: 2022-09-14 | Discharge: 2022-09-15 | Disposition: A | Discharge status: Other Acute Inpatient Hospital | Payer: 59 | Source: Home / Self Care | Attending: Emergency Medicine | Admitting: Emergency Medicine

## 2022-09-14 DIAGNOSIS — Z1152 Encounter for screening for COVID-19: Secondary | ICD-10-CM | POA: Insufficient documentation

## 2022-09-14 DIAGNOSIS — F419 Anxiety disorder, unspecified: Secondary | ICD-10-CM | POA: Insufficient documentation

## 2022-09-14 DIAGNOSIS — R45851 Suicidal ideations: Secondary | ICD-10-CM | POA: Insufficient documentation

## 2022-09-14 DIAGNOSIS — E119 Type 2 diabetes mellitus without complications: Secondary | ICD-10-CM | POA: Insufficient documentation

## 2022-09-14 DIAGNOSIS — G4700 Insomnia, unspecified: Secondary | ICD-10-CM | POA: Insufficient documentation

## 2022-09-14 DIAGNOSIS — F332 Major depressive disorder, recurrent severe without psychotic features: Secondary | ICD-10-CM | POA: Diagnosis present

## 2022-09-14 DIAGNOSIS — T424X2A Poisoning by benzodiazepines, intentional self-harm, initial encounter: Secondary | ICD-10-CM | POA: Insufficient documentation

## 2022-09-14 DIAGNOSIS — T50902A Poisoning by unspecified drugs, medicaments and biological substances, intentional self-harm, initial encounter: Secondary | ICD-10-CM

## 2022-09-14 DIAGNOSIS — F1210 Cannabis abuse, uncomplicated: Secondary | ICD-10-CM | POA: Insufficient documentation

## 2022-09-14 DIAGNOSIS — F17210 Nicotine dependence, cigarettes, uncomplicated: Secondary | ICD-10-CM | POA: Insufficient documentation

## 2022-09-14 DIAGNOSIS — F410 Panic disorder [episodic paroxysmal anxiety] without agoraphobia: Secondary | ICD-10-CM | POA: Diagnosis not present

## 2022-09-14 LAB — CBC WITH DIFFERENTIAL/PLATELET
Abs Immature Granulocytes: 0.09 10*3/uL — ABNORMAL HIGH (ref 0.00–0.07)
Basophils Absolute: 0.1 10*3/uL (ref 0.0–0.1)
Basophils Relative: 0 %
Eosinophils Absolute: 0.1 10*3/uL (ref 0.0–0.5)
Eosinophils Relative: 1 %
HCT: 37.6 % (ref 36.0–46.0)
Hemoglobin: 12.6 g/dL (ref 12.0–15.0)
Immature Granulocytes: 1 %
Lymphocytes Relative: 15 %
Lymphs Abs: 2.1 10*3/uL (ref 0.7–4.0)
MCH: 28.1 pg (ref 26.0–34.0)
MCHC: 33.5 g/dL (ref 30.0–36.0)
MCV: 83.7 fL (ref 80.0–100.0)
Monocytes Absolute: 0.5 10*3/uL (ref 0.1–1.0)
Monocytes Relative: 4 %
Neutro Abs: 11.5 10*3/uL — ABNORMAL HIGH (ref 1.7–7.7)
Neutrophils Relative %: 79 %
Platelets: ADEQUATE 10*3/uL (ref 150–400)
RBC: 4.49 MIL/uL (ref 3.87–5.11)
RDW: 15.1 % (ref 11.5–15.5)
WBC: 14.3 10*3/uL — ABNORMAL HIGH (ref 4.0–10.5)
nRBC: 0 % (ref 0.0–0.2)

## 2022-09-14 LAB — ACETAMINOPHEN LEVEL: Acetaminophen (Tylenol), Serum: <10 ug/mL — ABNORMAL LOW (ref 10–30)

## 2022-09-14 LAB — URINE DRUG SCREEN, QUALITATIVE (ARMC ONLY)
Amphetamines, Ur Screen: NOT DETECTED
Barbiturates, Ur Screen: NOT DETECTED
Benzodiazepine, Ur Scrn: POSITIVE — AB
Cannabinoid 50 Ng, Ur ~~LOC~~: POSITIVE — AB
Cocaine Metabolite,Ur ~~LOC~~: NOT DETECTED
MDMA (Ecstasy)Ur Screen: NOT DETECTED
Methadone Scn, Ur: NOT DETECTED
Opiate, Ur Screen: NOT DETECTED
Phencyclidine (PCP) Ur S: NOT DETECTED
Tricyclic, Ur Screen: POSITIVE — AB

## 2022-09-14 LAB — COMPREHENSIVE METABOLIC PANEL
ALT: 31 U/L (ref 0–44)
AST: 27 U/L (ref 15–41)
Albumin: 4.3 g/dL (ref 3.5–5.0)
Alkaline Phosphatase: 56 U/L (ref 38–126)
Anion gap: 11 (ref 5–15)
BUN: 13 mg/dL (ref 6–20)
CO2: 21 mmol/L — ABNORMAL LOW (ref 22–32)
Calcium: 9 mg/dL (ref 8.9–10.3)
Chloride: 105 mmol/L (ref 98–111)
Creatinine, Ser: 0.69 mg/dL (ref 0.44–1.00)
GFR, Estimated: >60 mL/min (ref >60)
Glucose, Bld: 119 mg/dL — ABNORMAL HIGH (ref 70–99)
Potassium: 3.6 mmol/L (ref 3.5–5.1)
Sodium: 137 mmol/L (ref 135–145)
Total Bilirubin: 0.6 mg/dL (ref 0.3–1.2)
Total Protein: 7.3 g/dL (ref 6.5–8.1)

## 2022-09-14 LAB — URINALYSIS, ROUTINE W REFLEX MICROSCOPIC
Bilirubin Urine: NEGATIVE
Glucose, UA: NEGATIVE mg/dL
Ketones, ur: NEGATIVE mg/dL
Nitrite: NEGATIVE
Protein, ur: NEGATIVE mg/dL
Specific Gravity, Urine: 1.009 (ref 1.005–1.030)
pH: 6 (ref 5.0–8.0)

## 2022-09-14 LAB — POC URINE PREG, ED: Preg Test, Ur: NEGATIVE

## 2022-09-14 LAB — ETHANOL: Alcohol, Ethyl (B): <10 mg/dL (ref <10)

## 2022-09-14 LAB — SALICYLATE LEVEL: Salicylate Lvl: <7 mg/dL — ABNORMAL LOW (ref 7.0–30.0)

## 2022-09-14 MED ORDER — NICOTINE 14 MG/24HR TD PT24
14.0000 mg | MEDICATED_PATCH | Freq: Every day | TRANSDERMAL | Status: DC
  Filled 2022-09-14: qty 1

## 2022-09-14 MED ORDER — MIRTAZAPINE 15 MG PO TABS
45.0000 mg | ORAL_TABLET | Freq: Every day | ORAL | Status: DC
  Administered 2022-09-15: 45 mg via ORAL
  Filled 2022-09-14: qty 3

## 2022-09-14 MED ORDER — METFORMIN HCL 500 MG PO TABS
500.0000 mg | ORAL_TABLET | Freq: Two times a day (BID) | ORAL | Status: DC
  Administered 2022-09-15: 500 mg via ORAL
  Filled 2022-09-14: qty 1

## 2022-09-14 MED ORDER — SERTRALINE HCL 50 MG PO TABS
150.0000 mg | ORAL_TABLET | Freq: Every day | ORAL | Status: DC
  Administered 2022-09-15: 150 mg via ORAL
  Filled 2022-09-14: qty 3

## 2022-09-14 NOTE — ED Notes (Signed)
Safety sitter Natalie Rose currently sitting with patient

## 2022-09-14 NOTE — ED Provider Notes (Signed)
[facility] Provider Note    Event Date/Time   First MD Initiated Contact with Patient 09/14/22 1021     (approximate)   History   Suicide Attempt   HPI  Natalie Rose is a 42 y.o. female history of PTSD and anxiety. Also she was seen yesterday in the ER for chest pain and discharged after reassuring workup  All patient reports she called 911 after she was not able to get her home anxiety under control, this led her to take about eight 1 mg Ativan tablets just prior to calling 911.  She also reports that yesterday she began feeling suicidal and started scratching at her forearms.  She denies any cuts or other and coingestants other than taking her normal prescribed medications at normal doses this morning  She takes Ativan daily and is prescribed it by her prescriber here in [place]  Denies any other recent illness except being here yesterday.  No ongoing chest pain.  She reports she feels tired and the medication has helped with her anxiety.  She does relate suicidal ideations because of the severity of her anxiety  Patient does review alert use of marijuana today     Physical Exam   Triage Vital Signs: ED Triage Vitals  Enc Vitals Group     BP 09/14/22 1030 99/69     Pulse Rate 09/14/22 1030 73     Resp 09/14/22 1030 (!) 22     Temp 09/14/22 1030 97.8 F (36.6 C)     Temp Source 09/14/22 1030 Oral     SpO2 09/14/22 1030 94 %     Weight 09/14/22 1032 167 lb 1.7 oz (75.8 kg)     Height 09/14/22 1032 4\' 11"  (1.499 m)     Head Circumference --      Peak Flow --      Pain Score 09/14/22 1032 0     Pain Loc --      Pain Edu? --      Excl. in GC? --     Most recent vital signs: Vitals:   09/15/22 0500 09/15/22 0600  BP: 93/64 103/67  Pulse: (!) 59 (!) 55  Resp: (!) 23 (!) 21  Temp:    SpO2: 95% 95%     General: Awake, no distress.  Resting comfortably even and unlabored respirations.  Does not show evidence of distress.   She is alert able to answer questions appropriately and is oriented but seems somewhat somnolent CV:  Good peripheral perfusion.  Normal tones and rate Resp:  Normal effort.  Clear bilaterally.  Work of breathing normal.  No bradypnea Abd:  No distention.  Soft nontender nondistended Other:  Follows commands rolls on her back rolls on her sides, moves extremities to request.  Speech just slightly slurred.  She is slow and wants somnolent but participates in exam.  Normal facial expressions.  Equal ocular movements   ED Results / Procedures / Treatments   Labs (all labs ordered are listed, but only abnormal results are displayed) Labs Reviewed  URINE DRUG SCREEN, QUALITATIVE ([facility] ONLY) - Abnormal; Notable for the following components:      Result Value   Tricyclic, Ur Screen POSITIVE (*)    Cannabinoid 50 Ng, Ur [place] POSITIVE (*)    Benzodiazepine, Ur Scrn POSITIVE (*)    All other components within normal limits  CBC WITH DIFFERENTIAL/PLATELET - Abnormal; Notable for the following components:   WBC 14.3 (*)  Neutro Abs 11.5 (*)    Abs Immature Granulocytes 0.09 (*)    All other components within normal limits  SALICYLATE LEVEL - Abnormal; Notable for the following components:   Salicylate Lvl <7.0 (*)    All other components within normal limits  ACETAMINOPHEN LEVEL - Abnormal; Notable for the following components:   Acetaminophen (Tylenol), Serum <10 (*)    All other components within normal limits  COMPREHENSIVE METABOLIC PANEL - Abnormal; Notable for the following components:   CO2 21 (*)    Glucose, Bld 119 (*)    All other components within normal limits  URINALYSIS, ROUTINE W REFLEX MICROSCOPIC - Abnormal; Notable for the following components:   Color, Urine YELLOW (*)    APPearance HAZY (*)    Hgb urine dipstick MODERATE (*)    Leukocytes,Ua SMALL (*)    Bacteria, UA RARE (*)    All other components within normal limits  SARS CORONAVIRUS 2 BY RT PCR  COMPREHENSIVE  METABOLIC PANEL  ETHANOL  POC URINE PREG, ED   Labs interpreted as normal comprehensive metabolic panel with exception of minimally reduced CO2.  CBC with mild leukocytosis.  Negative salicylate and acetaminophen levels.  Negative pregnancy test.  EKG  Interpreted by me at 1040 heart rate 75 QRS 90 QTc 440 Normal sinus rhythm no evidence of ischemia or acute toxicity   RADIOLOGY  No evidence or history of trauma.  No acute central neurologic symptoms other than being somewhat somnolent slight slurring of speech which seems consistent with benzodiazepine ingestion or other coingestants.  No signs or symptoms of be suggestive of stroke or acute central process.  No respiratory distress, I do not see indication for imaging at this time but will continue to observe the patient carefully      PROCEDURES:  Critical Care performed: No  Procedures   MEDICATIONS ORDERED IN ED: Medications - No data to display   IMPRESSION / MDM / ASSESSMENT AND PLAN / ED COURSE  I reviewed the triage vital signs and the nursing notes.                              Placed under involuntary commitment with psych consult discussed with Nanine Means  Differential diagnosis includes, but is not limited to, self-injurious behavior, appears that there may be some element of suicidal ideation behind her ingestion today but also she self-reports that she is also trying to treat her severe anxiety.  She reports she knows she is not supposed to take as many tablets that she did but she also reports nothing seems to help her anxiety.  She is currently under outpatient psychiatric care  Patient being observed, currently hemodynamically stable  Patient's presentation is most consistent with acute illness / injury with system symptoms.      Clinical Course as of 09/15/22 1848  Caleen Essex Sep 14, 2022  1422 The patient is resting comfortably without distress.  The need to observe [MQ]    Clinical Course User  Index [MQ] Sharyn Creamer, MD   ----------------------------------------- 3:14 PM on 09/14/2022 ----------------------------------------- Ongoing care assigned to Dr. Erma Heritage, follow-up on psychiatry consult and pending urinalysis.  Patient not yet medically cleared, continue to observe from effects of suspected benzodiazepine overdose.  Presently resting comfortably without distress  FINAL CLINICAL IMPRESSION(S) / ED DIAGNOSES   Final diagnoses:  Anxiety  Intentional overdose, initial encounter     Rx / DC Orders   ED  Discharge Orders     None        Note:  This document was prepared using Dragon voice recognition software and may include unintentional dictation errors.   Sharyn Creamer, MD 09/15/22 [phone]

## 2022-09-14 NOTE — ED Notes (Signed)
RN assumed care of pt, found she had just returned to her bed from the restroom.  Steady gait observed.  Pt confirms that that she just spoke to her mother.  No other needs identified at this time.

## 2022-09-14 NOTE — ED Notes (Signed)
Pt indicated that she has not had any urinary symptoms.  She was able to wake up and answer RN's questions.   Dr. Erma Heritage indicated that she is medically cleared at this time.

## 2022-09-14 NOTE — Consult Note (Signed)
[facility] Face-to-Face Psychiatry Consult   Reason for Consult:  Ativan overdose Referring Physician:  EDP Patient Identification: Natalie Rose MRN:  [id number] Principal Diagnosis: Major depressive disorder, recurrent severe without psychotic features Diagnosis:  Principal Problem:   Major depressive disorder, recurrent severe without psychotic features   Total Time spent with patient: 45 minutes  Subjective:   Natalie Rose is a 42 y.o. female patient admitted with depression with Ativan overdose.  HPI:  42 yo female presented after taking 8-9 Ativan overdose, drowsy on assessment, basic information obtained.  When asked if it was a suicide attempt, she stated, "I don't know or if was because I was panicking."  Her depression is "not good".  Anxiety is 6/10 with frequent panic attacks.  She was to follow up with her outpatient provider at [facility] today.  Client is being monitored medically at this time, will need psych admission when medically clears.  HPI on 4/11: Patient presents voluntarily to the ED via EMS. Patient reports that she called EMS because her chest was really hurting during a panic attack.     On evaluation, patient is somewhat tearful at times. She denies thought or intent of suicide. She has superficial scratches on left forearm, where she says she scratched herself with her fingernails for a "relief" of some of the anxiety. (Patient did this while in the ED). Patient denies homicidal thoughts, auditory or visual hallucinations. Perceptions intact. She speaks in clear, coherent sentences. She describes her symptoms of panic attack as "feeling like I can't breathe, heaviness in chest." At time of evaluation she did not present with acute panic symptoms. She reports that she sometimes wakes in her sleep. Patient reports that she was feeling stable when she was  discharged from an inpatient psychiatric stay  (Jan 21- Jul 02, 2022). She is followed by  Zorita Pang from [facility] for medication management. She reports her quetiapine was reduced to twice daily from 3 times daily.  Patient reports nothing specific that makes her anxiety worse, although she said she feels it has increased the last 3 days. She says she worries about general life stressors, her mom's health, and financial strain.   She continues to use marijuana but denies other illicit substances. Denies alcohol use.   Hospitalization is unlikely to be of benefit to patient at this time. Discussed at length the importance of getting into seeing a therapist to learn coping/grounding techniques to help control her anxiety. Patient states that her provider Zorita Pang) has put in a referral but no appointment yet. Provider called provider's office and verified this. That office does not have therapists. Writer was advised that patient has an appointment tomorrow (4/12) at 10:40 with Zorita Pang. Patient advised to return to ED if she has worsening symptoms prior to seeing her outpatient provider. Advised to stay with her mother today/tonight. Patient lives alone.     Past Psychiatric History: depression, anxiety  Risk to Self:  yes Risk to Others:  none Prior Inpatient Therapy:  [facility] Prior Outpatient Therapy:  [facility]  Past Medical History:  Past Medical History:  Diagnosis Date   Anxiety    Arthritis    Depression    Diabetes mellitus without complication    Mental health disorder    Migraines     Past Surgical History:  Procedure Laterality Date   MOUTH SURGERY     Family History:  Family History  Problem Relation Age of Onset   Osteopenia Mother  Heart failure Mother    Osteoarthritis Mother    Depression Mother    Diabetes Father    Family Psychiatric  History: see above Social History:  Social History   Substance and Sexual Activity  Alcohol Use Not Currently     Social History   Substance and Sexual Activity  Drug Use Yes    Frequency: 3.0 times per week   Types: Marijuana    Social History   Socioeconomic History   Marital status: Single    Spouse name: Not on file   Number of children: Not on file   Years of education: Not on file   Highest education level: Not on file  Occupational History   Not on file  Tobacco Use   Smoking status: Every Day    Packs/day: 1    Types: Cigarettes   Smokeless tobacco: Never  Vaping Use   Vaping Use: Never used  Substance and Sexual Activity   Alcohol use: Not Currently   Drug use: Yes    Frequency: 3.0 times per week    Types: Marijuana   Sexual activity: Not Currently    Birth control/protection: None  Other Topics Concern   Not on file  Social History Narrative   Not on file   Social Determinants of Health   Financial Resource Strain: Not on file  Food Insecurity: Food Insecurity Present (06/24/2022)   Hunger Vital Sign    Worried About Running Out of Food in the Last Year: Sometimes true    Ran Out of Food in the Last Year: Often true  Transportation Needs: Unmet Transportation Needs (06/24/2022)   PRAPARE - Administrator, Civil Service (Medical): Yes    Lack of Transportation (Non-Medical): Yes  Physical Activity: Not on file  Stress: Not on file  Social Connections: Not on file   Additional Social History:    Allergies:   Allergies  Allergen Reactions   Diazepam Hives, Anxiety, Itching, Other (See Comments) and Rash    unknown unknown    Lurasidone     Labs:  Results for orders placed or performed during the hospital encounter of 09/14/22 (from the past 48 hour(s))  Comprehensive metabolic panel     Status: None   Collection Time: 09/14/22 10:23 AM  Result Value Ref Range   Sodium  135 - 145 mmol/L    SPECIMEN HEMOLYZED. HEMOLYSIS MAY AFFECT INTEGRITY OF RESULTS.    Comment: C/VET HOOKER AT 1120 09/14/22 DAS   Potassium  3.5 - 5.1 mmol/L    SPECIMEN HEMOLYZED. HEMOLYSIS MAY AFFECT INTEGRITY OF RESULTS.   Chloride  98 -  111 mmol/L    SPECIMEN HEMOLYZED. HEMOLYSIS MAY AFFECT INTEGRITY OF RESULTS.   CO2  22 - 32 mmol/L    SPECIMEN HEMOLYZED. HEMOLYSIS MAY AFFECT INTEGRITY OF RESULTS.   Glucose, Bld  70 - 99 mg/dL    SPECIMEN HEMOLYZED. HEMOLYSIS MAY AFFECT INTEGRITY OF RESULTS.    Comment: Glucose reference range applies only to samples taken after fasting for at least 8 hours.   BUN  6 - 20 mg/dL    SPECIMEN HEMOLYZED. HEMOLYSIS MAY AFFECT INTEGRITY OF RESULTS.   Creatinine, Ser  0.44 - 1.00 mg/dL    SPECIMEN HEMOLYZED. HEMOLYSIS MAY AFFECT INTEGRITY OF RESULTS.   Calcium  8.9 - 10.3 mg/dL    SPECIMEN HEMOLYZED. HEMOLYSIS MAY AFFECT INTEGRITY OF RESULTS.   Total Protein  6.5 - 8.1 g/dL    SPECIMEN HEMOLYZED. HEMOLYSIS MAY AFFECT INTEGRITY OF RESULTS.  Albumin  3.5 - 5.0 g/dL    SPECIMEN HEMOLYZED. HEMOLYSIS MAY AFFECT INTEGRITY OF RESULTS.   AST  15 - 41 U/L    SPECIMEN HEMOLYZED. HEMOLYSIS MAY AFFECT INTEGRITY OF RESULTS.   ALT  0 - 44 U/L    SPECIMEN HEMOLYZED. HEMOLYSIS MAY AFFECT INTEGRITY OF RESULTS.   Alkaline Phosphatase  38 - 126 U/L    SPECIMEN HEMOLYZED. HEMOLYSIS MAY AFFECT INTEGRITY OF RESULTS.   Total Bilirubin  0.3 - 1.2 mg/dL    SPECIMEN HEMOLYZED. HEMOLYSIS MAY AFFECT INTEGRITY OF RESULTS.   GFR, Estimated  >60 mL/min    SPECIMEN HEMOLYZED. HEMOLYSIS MAY AFFECT INTEGRITY OF RESULTS.   Anion gap  5 - 15    SPECIMEN HEMOLYZED. HEMOLYSIS MAY AFFECT INTEGRITY OF RESULTS.    Comment: Performed at [facility], [address] [place], [place] [address]  Ethanol     Status: None   Collection Time: 09/14/22 10:23 AM  Result Value Ref Range   Alcohol, Ethyl (B) <10 <10 mg/dL    Comment: (NOTE) Lowest detectable limit for serum alcohol is 10 mg/dL.  For medical purposes only. Performed at [facility], [address] [place], [place] [address]   Urine Drug Screen, Qualitative     Status: Abnormal   Collection Time: 09/14/22 10:23 AM  Result Value Ref Range    Tricyclic, Ur Screen POSITIVE (A) NONE DETECTED   Amphetamines, Ur Screen NONE DETECTED NONE DETECTED   MDMA (Ecstasy)Ur Screen NONE DETECTED NONE DETECTED   Cocaine Metabolite,Ur [place] NONE DETECTED NONE DETECTED   Opiate, Ur Screen NONE DETECTED NONE DETECTED   Phencyclidine (PCP) Ur S NONE DETECTED NONE DETECTED   Cannabinoid 50 Ng, Ur [place] POSITIVE (A) NONE DETECTED   Barbiturates, Ur Screen NONE DETECTED NONE DETECTED   Benzodiazepine, Ur Scrn POSITIVE (A) NONE DETECTED   Methadone Scn, Ur NONE DETECTED NONE DETECTED    Comment: (NOTE) Tricyclics + metabolites, urine    Cutoff 1000 ng/mL Amphetamines + metabolites, urine  Cutoff 1000 ng/mL MDMA (Ecstasy), urine              Cutoff 500 ng/mL Cocaine Metabolite, urine          Cutoff 300 ng/mL Opiate + metabolites, urine        Cutoff 300 ng/mL Phencyclidine (PCP), urine         Cutoff 25 ng/mL Cannabinoid, urine                 Cutoff 50 ng/mL Barbiturates + metabolites, urine  Cutoff 200 ng/mL Benzodiazepine, urine              Cutoff 200 ng/mL Methadone, urine                   Cutoff 300 ng/mL  The urine drug screen provides only a preliminary, unconfirmed analytical test result and should not be used for non-medical purposes. Clinical consideration and professional judgment should be applied to any positive drug screen result due to possible interfering substances. A more specific alternate chemical method must be used in order to obtain a confirmed analytical result. Gas chromatography / mass spectrometry (GC/MS) is the preferred confirm atory method. Performed at [facility], [address] [place], [place] [address]   CBC with Diff     Status: Abnormal   Collection Time: 09/14/22 10:23 AM  Result Value Ref Range   WBC 14.3 (H) 4.0 - 10.5 K/uL    Comment: WHITE COUNT CONFIRMED  ON SMEAR   RBC 4.49 3.87 - 5.11 MIL/uL   Hemoglobin 12.6 12.0 - 15.0 g/dL   HCT 16.1 09.6 - 04.5 %   MCV 83.7 80.0 - 100.0 fL   MCH  28.1 26.0 - 34.0 pg   MCHC 33.5 30.0 - 36.0 g/dL   RDW 40.9 81.1 - 91.4 %   Platelets  150 - 400 K/uL    PLATELET CLUMPS NOTED ON SMEAR, COUNT APPEARS ADEQUATE    Comment: SPECIMEN CHECKED FOR CLOTS REPEATED TO VERIFY    nRBC 0.0 0.0 - 0.2 %   Neutrophils Relative % 79 %   Neutro Abs 11.5 (H) 1.7 - 7.7 K/uL   Lymphocytes Relative 15 %   Lymphs Abs 2.1 0.7 - 4.0 K/uL   Monocytes Relative 4 %   Monocytes Absolute 0.5 0.1 - 1.0 K/uL   Eosinophils Relative 1 %   Eosinophils Absolute 0.1 0.0 - 0.5 K/uL   Basophils Relative 0 %   Basophils Absolute 0.1 0.0 - 0.1 K/uL   Immature Granulocytes 1 %   Abs Immature Granulocytes 0.09 (H) 0.00 - 0.07 K/uL    Comment: Performed at [facility], [address] [place], [place] [address]  Salicylate level     Status: Abnormal   Collection Time: 09/14/22 10:23 AM  Result Value Ref Range   Salicylate Lvl <7.0 (L) 7.0 - 30.0 mg/dL    Comment: Performed at [facility], [address] [place], [place] [address]  Acetaminophen level     Status: Abnormal   Collection Time: 09/14/22 10:23 AM  Result Value Ref Range   Acetaminophen (Tylenol), Serum <10 (L) 10 - 30 ug/mL    Comment: (NOTE) Therapeutic concentrations vary significantly. A range of 10-30 ug/mL  may be an effective concentration for many patients. However, some  are best treated at concentrations outside of this range. Acetaminophen concentrations >150 ug/mL at 4 hours after ingestion  and >50 ug/mL at 12 hours after ingestion are often associated with  toxic reactions.  Performed at [facility], [address] [place], [place] [address]   POC urine preg, ED     Status: None   Collection Time: 09/14/22 10:40 AM  Result Value Ref Range   Preg Test, Ur NEGATIVE NEGATIVE    Comment:        THE SENSITIVITY OF THIS METHODOLOGY IS >24 mIU/mL   Comprehensive metabolic panel     Status: Abnormal   Collection Time: 09/14/22 11:35 AM  Result Value Ref  Range   Sodium 137 135 - 145 mmol/L   Potassium 3.6 3.5 - 5.1 mmol/L   Chloride 105 98 - 111 mmol/L   CO2 21 (L) 22 - 32 mmol/L   Glucose, Bld 119 (H) 70 - 99 mg/dL    Comment: Glucose reference range applies only to samples taken after fasting for at least 8 hours.   BUN 13 6 - 20 mg/dL   Creatinine, Ser 4.69 0.44 - 1.00 mg/dL   Calcium 9.0 8.9 - 62.9 mg/dL   Total Protein 7.3 6.5 - 8.1 g/dL   Albumin 4.3 3.5 - 5.0 g/dL   AST 27 15 - 41 U/L   ALT 31 0 - 44 U/L   Alkaline Phosphatase 56 38 - 126 U/L   Total Bilirubin 0.6 0.3 - 1.2 mg/dL   GFR, Estimated >52 >84 mL/min    Comment: (NOTE) Calculated using the CKD-EPI Creatinine Equation (2021)    Anion gap 11 5 - 15  Comment: Performed at [facility], [address] [place], [place] [address]    No current facility-administered medications for this encounter.   Current Outpatient Medications  Medication Sig Dispense Refill   hydrOXYzine (ATARAX) 50 MG tablet Take 1 tablet (50 mg total) by mouth 3 (three) times daily as needed. 60 tablet 1   metFORMIN (GLUCOPHAGE) 500 MG tablet Take 1 tablet (500 mg total) by mouth 2 (two) times daily with a meal. 60 tablet 1   mirtazapine (REMERON) 45 MG tablet Take 1 tablet (45 mg total) by mouth at bedtime. 30 tablet 1   nicotine (NICODERM CQ - DOSED IN MG/24 HOURS) 14 mg/24hr patch Place 1 patch (14 mg total) onto the skin daily. 28 patch 1   OLANZapine (ZYPREXA) 10 MG tablet Take 1 tablet (10 mg total) by mouth at bedtime. 30 tablet 1   ondansetron (ZOFRAN ODT) 4 MG disintegrating tablet Take 1 tablet (4 mg total) by mouth every 8 (eight) hours as needed. 30 tablet 1   prazosin (MINIPRESS) 2 MG capsule Take 2 capsules (4 mg total) by mouth at bedtime. 60 capsule 1   promethazine (PHENERGAN) 25 MG tablet Take 1 tablet (25 mg total) by mouth every 6 (six) hours as needed for nausea or vomiting. 30 tablet 1   propranolol (INDERAL) 10 MG tablet Take 1 tablet (10 mg total) by mouth 3  (three) times daily. 90 tablet 1   QUEtiapine (SEROQUEL) 50 MG tablet Take 1 tablet (50 mg total) by mouth 3 (three) times daily. 90 tablet 1   sertraline (ZOLOFT) 50 MG tablet Take 3 tablets (150 mg total) by mouth daily. 90 tablet 1   SUMAtriptan (IMITREX) 50 MG tablet Take 50 mg by mouth every 2 (two) hours as needed.     traZODone (DESYREL) 100 MG tablet Take 1 tablet (100 mg total) by mouth at bedtime. 30 tablet 1    Musculoskeletal: Strength & Muscle Tone: within normal limits Gait & Station: normal Patient leans: N/A  Psychiatric Specialty Exam: Physical Exam Vitals and nursing note reviewed.  Constitutional:      Appearance: Normal appearance.  HENT:     Head: Normocephalic.     Nose: Nose normal.  Pulmonary:     Effort: Pulmonary effort is normal.  Musculoskeletal:        General: Normal range of motion.     Cervical back: Normal range of motion.  Neurological:     General: No focal deficit present.     Mental Status: She is alert and oriented to person, place, and time.  Psychiatric:        Attention and Perception: Attention and perception normal.        Mood and Affect: Mood is anxious and depressed.        Speech: Speech normal.        Behavior: Behavior normal. Behavior is cooperative.        Thought Content: Thought content includes suicidal ideation. Thought content includes suicidal plan.        Cognition and Memory: Cognition and memory normal.        Judgment: Judgment is impulsive.     Review of Systems  Psychiatric/Behavioral:  Positive for depression and suicidal ideas. The patient is nervous/anxious.   All other systems reviewed and are negative.   Blood pressure 95/68, pulse 66, temperature 97.8 F (36.6 C), temperature source Oral, resp. rate (!) 27, height 4\' 11"  (1.499 m), weight 75.8 kg, SpO2 96 %.Body mass  index is 33.75 kg/m.  General Appearance: Casual  Eye Contact:  Minimal  Speech:  Slow  Volume:  Decreased  Mood:  Anxious and  Depressed  Affect:  Congruent  Thought Process:  Coherent  Orientation:  Full (Time, Place, and Person)  Thought Content:  Rumination  Suicidal Thoughts:  Yes.  with intent/plan  Homicidal Thoughts:  No  Memory:  Immediate;   Fair Recent;   Fair Remote;   Fair  Judgement:  Impaired  Insight:  Fair  Psychomotor Activity:  Decreased  Concentration:  Concentration: Fair and Attention Span: Fair  Recall:  [employer] of Knowledge:  Fair  Language:  Fair  Akathisia:  No  Handed:  Right  AIMS (if indicated):     Assets:  Housing Leisure Time Physical Health Resilience  ADL's:  Intact  Cognition:  Impaired,  Mild  Sleep:         Physical Exam: Physical Exam Vitals and nursing note reviewed.  Constitutional:      Appearance: Normal appearance.  HENT:     Head: Normocephalic.     Nose: Nose normal.  Pulmonary:     Effort: Pulmonary effort is normal.  Musculoskeletal:        General: Normal range of motion.     Cervical back: Normal range of motion.  Neurological:     General: No focal deficit present.     Mental Status: She is alert and oriented to person, place, and time.  Psychiatric:        Attention and Perception: Attention and perception normal.        Mood and Affect: Mood is anxious and depressed.        Speech: Speech normal.        Behavior: Behavior normal. Behavior is cooperative.        Thought Content: Thought content includes suicidal ideation. Thought content includes suicidal plan.        Cognition and Memory: Cognition and memory normal.        Judgment: Judgment is impulsive.    Review of Systems  Psychiatric/Behavioral:  Positive for depression and suicidal ideas. The patient is nervous/anxious.   All other systems reviewed and are negative.  Blood pressure 95/68, pulse 66, temperature 97.8 F (36.6 C), temperature source Oral, resp. rate (!) 27, height 4\' 11"  (1.499 m), weight 75.8 kg, SpO2 96 %. Body mass index is 33.75 kg/m.  Treatment Plan  Summary: Daily contact with patient to assess and evaluate symptoms and progress in treatment, Medication management, and Plan : Major depressive disorder, recurrent, severe without psychosis: Admit to inpatient when medically cleared, with holding medications r/t overdose needing to clear.  Disposition: Recommend psychiatric Inpatient admission when medically cleared.  Nanine Means, NP 09/14/2022 12:25 PM

## 2022-09-14 NOTE — BH Assessment (Addendum)
Comprehensive Clinical Assessment (CCA) Note  09/14/2022 Pennelope Rose [id number]  Chief Complaint:  Chief Complaint  Patient presents with   Suicide Attempt   Visit Diagnosis: Major Depression   Natalie Rose. [name] is a 42 year old female who presents to the ER due to overdosing on her medications. Per the patient, she's unsure if she wanted to end her life or not. "I want the anxiety to stop. That's why I took them (medications)." When asked about her outpatient appointment for today, she put her her down, closed her eyes and appeared sleepy. However, when it was explained to her, she was going to be tapered off of the Ativan, because of the overdose, she opened her eyes and became engaged again. She asked questions regarding it. Patient was seen in the ER yesterday for anxiety and discharged having no SI, safety plan in place the event the anxiety increases and follow up outpatient appointment for today.  During the interview, the patient was able to provide answers to the questions. She denies HI and AV/H. Regarding SI, she did not give a clear answer. Patient also has poor insight about the overdose and the there risk factors involving it.  CCA Screening, Triage and Referral (STR)  Patient Reported Information How did you hear about [place]? Self  What Is the Reason for Your Visit/Call Today? Came to the ER due to intentional overdose.  How Long Has This Been Causing You Problems? 1 wk - 1 month  What Do You Feel Would Help You the Most Today? Treatment for Depression or other mood problem; Alcohol or Drug Use Treatment   Have You Recently Had Any Thoughts About Hurting Yourself? Yes  Are You Planning to Commit Suicide/Harm Yourself At This time? Yes   Flowsheet Row ED from 09/14/2022 in [facility] Emergency Department at [facility] ED from 09/13/2022 in [facility] Emergency Department at [facility] Admission (Discharged) from 06/24/2022 in [facility] INPATIENT  BEHAVIORAL MEDICINE  C-SSRS RISK CATEGORY High Risk Error: Q3, 4, or 5 should not be populated when Q2 is No High Risk       Have you Recently Had Thoughts About Hurting Someone Natalie Rose? No  Are You Planning to Harm Someone at This Time? No  Explanation: No data recorded  Have You Used Any Alcohol or Drugs in the Past 24 Hours? Yes  What Did You Use and How Much? Cannabis   Do You Currently Have a Therapist/Psychiatrist? Yes  Name of Therapist/Psychiatrist: Name of Therapist/Psychiatrist: [facility]   Have You Been Recently Discharged From Any Office Practice or Programs? No  Explanation of Discharge From Practice/Program: No data recorded    CCA Screening Triage Referral Assessment Type of Contact: Face-to-Face  Telemedicine Service Delivery:   Is this Initial or Reassessment?   Date Telepsych consult ordered in [facility]:    Time Telepsych consult ordered in [facility]:    Location of Assessment: [facility] ED  Provider Location: [facility] ED   Collateral Involvement: [facility], outpatient provider.   Does Patient Have a Automotive engineer Guardian? No  Legal Guardian Contact Information: No data recorded Copy of Legal Guardianship Form: No data recorded Legal Guardian Notified of Arrival: No data recorded Legal Guardian Notified of Pending Discharge: No data recorded If Minor and Not Living with Parent(s), Who has Custody? No data recorded Is CPS involved or ever been involved? Never  Is APS involved or ever been involved? Never   Patient Determined To Be At Risk for Harm To Self or  Others Based on Review of Patient Reported Information or Presenting Complaint? No  Method: No data recorded Availability of Means: No data recorded Intent: No data recorded Notification Required: No data recorded Additional Information for Danger to Others Potential: No data recorded Additional Comments for Danger to Others Potential: No data recorded Are There Guns or Other Weapons in  Your Home? No  Types of Guns/Weapons: No data recorded Are These Weapons Safely Secured?                            No data recorded Who Could Verify You Are Able To Have These Secured: No data recorded Do You Have any Outstanding Charges, Pending Court Dates, Parole/Probation? No data recorded Contacted To Inform of Risk of Harm To Self or Others: No data recorded   Does Patient Present under Involuntary Commitment? Yes    [place] of Residence: [place]   Patient Currently Receiving the Following Services: Medication Management   Determination of Need: Emergent (2 hours)   Options For Referral: Inpatient Hospitalization     CCA Biopsychosocial Patient Reported Schizophrenia/Schizoaffective Diagnosis in Past: No   Strengths: Some insight, seeking help and have stable housing.   Mental Health Symptoms Depression:   Hopelessness; Difficulty Concentrating   Duration of Depressive symptoms:  Duration of Depressive Symptoms: Greater than two weeks   Mania:   None   Anxiety:    N/A   Psychosis:   None   Duration of Psychotic symptoms:    Trauma:   N/A   Obsessions:   N/A   Compulsions:   N/A   Inattention:   N/A   Hyperactivity/Impulsivity:   N/A   Oppositional/Defiant Behaviors:   N/A   Emotional Irregularity:   N/A   Other Mood/Personality Symptoms:  No data recorded   Mental Status Exam Appearance and self-care  Stature:   Average   Weight:   Average weight   Clothing:   Neat/clean; Age-appropriate   Grooming:   Normal   Cosmetic use:   None   Posture/gait:   Normal   Motor activity:   Slowed   Sensorium  Attention:   Inattentive   Concentration:   Scattered   Orientation:   X5   Recall/memory:   Normal   Affect and Mood  Affect:   Appropriate; Depressed   Mood:   Depressed   Relating  Eye contact:   Fleeting   Facial expression:   Depressed   Attitude toward examiner:   Cooperative   Thought  and Language  Speech flow:  Articulation error   Thought content:   Appropriate to Mood and Circumstances   Preoccupation:   Other (Comment)   Hallucinations:   None   Organization:   Disorganized; Loose   Company secretary of Knowledge:   Fair   Intelligence:   Average   Abstraction:   Normal   Judgement:   Impaired   Reality Testing:   Distorted   Insight:   Poor   Decision Making:   Impulsive   Social Functioning  Social Maturity:   Impulsive   Social Judgement:   Heedless   Stress  Stressors:   Other (Comment)   Coping Ability:   Overwhelmed   Skill Deficits:   None   Supports:   Family     Religion: Religion/Spirituality Are You A Religious Person?: No  Leisure/Recreation: Leisure / Recreation Do You Have Hobbies?: No  Exercise/Diet: Exercise/Diet Do You  Exercise?: No Have You Gained or Lost A Significant Amount of Weight in the Past Six Months?: No Do You Follow a Special Diet?: No Do You Have Any Trouble Sleeping?: Yes Explanation of Sleeping Difficulties: Reports of having difficulty going to sleep because of anxiety.   CCA Employment/Education Employment/Work Situation: Employment / Work Systems developer: On disability Why is Patient on Disability: Mental Health dx Has Patient ever Been in the [employer]?: No  Education: Education Is Patient Currently Attending School?: No Did You Have An Individualized Education Program (IIEP): No Did You Have Any Difficulty At [employer]?: No Patient's Education Has Been Impacted by Current Illness: No   CCA Family/Childhood History Family and Relationship History: Family history Marital status: Single Does patient have children?: No  Childhood History:  Childhood History By whom was/is the patient raised?: Mother Did patient suffer any verbal/emotional/physical/sexual abuse as a child?: No Did patient suffer from severe childhood neglect?: No Has patient  ever been sexually abused/assaulted/raped as an adolescent or adult?: No Was the patient ever a victim of a crime or a disaster?: No Witnessed domestic violence?: No Has patient been affected by domestic violence as an adult?: No       CCA Substance Use Alcohol/Drug Use: Alcohol / Drug Use Pain Medications: See MAR Prescriptions: See MAR Over the Counter: See MAR History of alcohol / drug use?: Yes Longest period of sobriety (when/how long): Unable to quantify Substance #1 Name of Substance 1: Cannabis 1 - Frequency: Unable to quantify 1- Route of Use: Smoke   ASAM's:  Six Dimensions of Multidimensional Assessment  Dimension 1:  Acute Intoxication and/or Withdrawal Potential:      Dimension 2:  Biomedical Conditions and Complications:      Dimension 3:  Emotional, Behavioral, or Cognitive Conditions and Complications:     Dimension 4:  Readiness to Change:     Dimension 5:  Relapse, Continued use, or Continued Problem Potential:     Dimension 6:  Recovery/Living Environment:     ASAM Severity Score:    ASAM Recommended Level of Treatment:     Substance use Disorder (SUD)    Recommendations for Services/Supports/Treatments:    Discharge Disposition:    DSM5 Diagnoses: Patient Active Problem List   Diagnosis Date Noted   Panic attack 06/25/2022   Major depressive disorder, recurrent severe without psychotic features 06/24/2022   Diplopia 12/15/2020   Self-inflicted laceration of wrist 06/17/2018   Cannabis abuse 06/17/2018   MDD (major depressive disorder), recurrent severe, without psychosis 06/16/2018   Borderline personality disorder 09/20/2012   PTSD (post-traumatic stress disorder) 09/20/2012     Referrals to Alternative Service(s): Referred to Alternative Service(s):   Place:   Date:   Time:    Referred to Alternative Service(s):   Place:   Date:   Time:    Referred to Alternative Service(s):   Place:   Date:   Time:    Referred to Alternative  Service(s):   Place:   Date:   Time:     Lilyan Gilford MS, LCAS, [facility], [facility] Therapeutic Triage Specialist 09/14/2022 12:04 PM

## 2022-09-14 NOTE — ED Provider Notes (Signed)
Patient medically stable for psych disposition.   Shaune Pollack, MD 09/14/22 [phone]

## 2022-09-14 NOTE — ED Triage Notes (Signed)
Pt comes in via [facility] from home after a suicide attempt. According to EMS, the pt took between 8-10 mg of ativan. Pt states that her anxiety got out of control this morning, so she took the medicine, and then called 911. Pt also states that she was trying to control her anxiety and end her life. Pt was here yesterday with complaints of chest pain and anxiety, and was discharged home. Pt states that while she was here yesterday she was scratching her arm with her fingernail to hurt herself. Pt is alert and oriented x4, and is very lethargic on presentation.  Pt has an 18g in the right AC, and received 100 cc saline in transit.  EMS Vitals  BP 100/55 P 84  RR 18-25

## 2022-09-14 NOTE — ED Notes (Signed)
Pt denied snack but was given something to drink

## 2022-09-14 NOTE — ED Notes (Signed)
IVC  CONSULT  DONE  PENDING  PLACEMENT 

## 2022-09-14 NOTE — ED Notes (Signed)
Pt ate 25% of dinner tray, pt requested tray be thrown out. All trash was disposed of properly

## 2022-09-14 NOTE — ED Notes (Signed)
This tech assisted pt to change into beh health scrubs.  Pt belongings includes  Grey tshirt Armed forces technical officer socks [employer] Christmas sweatpants  Silver necklace Black ring

## 2022-09-14 NOTE — ED Notes (Signed)
1:1 Sitter Faith currently sitting with patient.

## 2022-09-14 NOTE — ED Notes (Signed)
Patient resting on her side, which is contributing to her lower blood pressure reads.

## 2022-09-14 NOTE — ED Notes (Signed)
Pt given a dinner tray

## 2022-09-14 NOTE — ED Notes (Signed)
IVC pending consult   

## 2022-09-15 ENCOUNTER — Inpatient Hospital Stay
Admission: AD | Admit: 2022-09-15 | Discharge: 2022-09-20 | DRG: 885 | Disposition: A | Discharge status: Home / Self Care | Payer: 59 | Source: Intra-hospital | Attending: Psychiatry | Admitting: Psychiatry

## 2022-09-15 ENCOUNTER — Encounter: Payer: Self-pay | Admitting: Psychiatry

## 2022-09-15 ENCOUNTER — Other Ambulatory Visit: Payer: Self-pay

## 2022-09-15 DIAGNOSIS — E119 Type 2 diabetes mellitus without complications: Secondary | ICD-10-CM | POA: Diagnosis present

## 2022-09-15 DIAGNOSIS — F17210 Nicotine dependence, cigarettes, uncomplicated: Secondary | ICD-10-CM | POA: Diagnosis present

## 2022-09-15 DIAGNOSIS — Z1152 Encounter for screening for COVID-19: Secondary | ICD-10-CM | POA: Diagnosis not present

## 2022-09-15 DIAGNOSIS — Z7984 Long term (current) use of oral hypoglycemic drugs: Secondary | ICD-10-CM

## 2022-09-15 DIAGNOSIS — Z8249 Family history of ischemic heart disease and other diseases of the circulatory system: Secondary | ICD-10-CM

## 2022-09-15 DIAGNOSIS — Z9151 Personal history of suicidal behavior: Secondary | ICD-10-CM

## 2022-09-15 DIAGNOSIS — F332 Major depressive disorder, recurrent severe without psychotic features: Secondary | ICD-10-CM | POA: Diagnosis present

## 2022-09-15 DIAGNOSIS — F429 Obsessive-compulsive disorder, unspecified: Secondary | ICD-10-CM | POA: Insufficient documentation

## 2022-09-15 DIAGNOSIS — Z818 Family history of other mental and behavioral disorders: Secondary | ICD-10-CM | POA: Diagnosis not present

## 2022-09-15 DIAGNOSIS — R45851 Suicidal ideations: Secondary | ICD-10-CM | POA: Diagnosis present

## 2022-09-15 DIAGNOSIS — Z833 Family history of diabetes mellitus: Secondary | ICD-10-CM | POA: Diagnosis not present

## 2022-09-15 DIAGNOSIS — F4310 Post-traumatic stress disorder, unspecified: Secondary | ICD-10-CM | POA: Diagnosis present

## 2022-09-15 DIAGNOSIS — F1210 Cannabis abuse, uncomplicated: Secondary | ICD-10-CM | POA: Diagnosis present

## 2022-09-15 DIAGNOSIS — Z5982 Transportation insecurity: Secondary | ICD-10-CM | POA: Diagnosis not present

## 2022-09-15 DIAGNOSIS — F410 Panic disorder [episodic paroxysmal anxiety] without agoraphobia: Secondary | ICD-10-CM | POA: Diagnosis present

## 2022-09-15 DIAGNOSIS — G4700 Insomnia, unspecified: Secondary | ICD-10-CM | POA: Diagnosis present

## 2022-09-15 DIAGNOSIS — X789XXA Intentional self-harm by unspecified sharp object, initial encounter: Secondary | ICD-10-CM | POA: Diagnosis present

## 2022-09-15 DIAGNOSIS — S61519A Laceration without foreign body of unspecified wrist, initial encounter: Secondary | ICD-10-CM | POA: Diagnosis present

## 2022-09-15 DIAGNOSIS — Z79899 Other long term (current) drug therapy: Secondary | ICD-10-CM | POA: Diagnosis not present

## 2022-09-15 LAB — SARS CORONAVIRUS 2 BY RT PCR: SARS Coronavirus 2 by RT PCR: NEGATIVE

## 2022-09-15 MED ORDER — GABAPENTIN 300 MG PO CAPS
300.0000 mg | ORAL_CAPSULE | Freq: Three times a day (TID) | ORAL | Status: DC
  Administered 2022-09-15 – 2022-09-21 (×18): 300 mg via ORAL
  Filled 2022-09-15 (×2): qty 1
  Filled 2022-09-15: qty 3
  Filled 2022-09-15 (×6): qty 1
  Filled 2022-09-15: qty 3
  Filled 2022-09-15 (×3): qty 1
  Filled 2022-09-15: qty 3
  Filled 2022-09-15 (×4): qty 1

## 2022-09-15 MED ORDER — HYDROXYZINE HCL 25 MG PO TABS
50.0000 mg | ORAL_TABLET | Freq: Three times a day (TID) | ORAL | Status: DC | PRN
  Administered 2022-09-15: 50 mg via ORAL
  Filled 2022-09-15: qty 2

## 2022-09-15 MED ORDER — NICOTINE 14 MG/24HR TD PT24
14.0000 mg | MEDICATED_PATCH | Freq: Every day | TRANSDERMAL | Status: DC
  Filled 2022-09-15 (×4): qty 1

## 2022-09-15 MED ORDER — SERTRALINE HCL 25 MG PO TABS
150.0000 mg | ORAL_TABLET | Freq: Every day | ORAL | Status: DC
  Administered 2022-09-16 – 2022-09-21 (×6): 150 mg via ORAL
  Filled 2022-09-15 (×6): qty 2

## 2022-09-15 MED ORDER — METFORMIN HCL 500 MG PO TABS
500.0000 mg | ORAL_TABLET | Freq: Two times a day (BID) | ORAL | Status: DC
  Administered 2022-09-15 – 2022-09-21 (×12): 500 mg via ORAL
  Filled 2022-09-15 (×12): qty 1

## 2022-09-15 MED ORDER — HYDROXYZINE HCL 50 MG PO TABS
50.0000 mg | ORAL_TABLET | Freq: Three times a day (TID) | ORAL | Status: DC | PRN
  Administered 2022-09-15 – 2022-09-19 (×5): 50 mg via ORAL
  Filled 2022-09-15 (×5): qty 1

## 2022-09-15 MED ORDER — OLANZAPINE 10 MG PO TABS: 10.0000 mg | ORAL_TABLET | Freq: Two times a day (BID) | ORAL | Status: DC | PRN

## 2022-09-15 MED ORDER — MIRTAZAPINE 15 MG PO TABS
15.0000 mg | ORAL_TABLET | Freq: Every day | ORAL | Status: DC
  Administered 2022-09-15: 15 mg via ORAL
  Filled 2022-09-15: qty 1

## 2022-09-15 MED ORDER — MIRTAZAPINE 15 MG PO TABS: 15.0000 mg | ORAL_TABLET | Freq: Every day | ORAL | Status: DC

## 2022-09-15 MED ORDER — MAGNESIUM HYDROXIDE 400 MG/5ML PO SUSP: 30.0000 mL | Freq: Every day | ORAL | Status: DC | PRN

## 2022-09-15 MED ORDER — BACITRACIN-NEOMYCIN-POLYMYXIN OINTMENT TUBE
TOPICAL_OINTMENT | Freq: Two times a day (BID) | CUTANEOUS | Status: DC
  Administered 2022-09-15 – 2022-09-16 (×2): 1 via TOPICAL
  Filled 2022-09-15: qty 14.17

## 2022-09-15 MED ORDER — TRAZODONE HCL 100 MG PO TABS
100.0000 mg | ORAL_TABLET | Freq: Every day | ORAL | Status: DC
  Administered 2022-09-15 – 2022-09-20 (×6): 100 mg via ORAL
  Filled 2022-09-15 (×7): qty 1

## 2022-09-15 MED ORDER — OLANZAPINE 10 MG IM SOLR: 10.0000 mg | Freq: Two times a day (BID) | INTRAMUSCULAR | Status: DC | PRN

## 2022-09-15 MED ORDER — ALUM & MAG HYDROXIDE-SIMETH 200-200-20 MG/5ML PO SUSP: 30.0000 mL | ORAL | Status: DC | PRN

## 2022-09-15 MED ORDER — GABAPENTIN 300 MG PO CAPS
300.0000 mg | ORAL_CAPSULE | Freq: Three times a day (TID) | ORAL | Status: DC
  Administered 2022-09-15: 300 mg via ORAL
  Filled 2022-09-15: qty 1

## 2022-09-15 MED ORDER — LORAZEPAM 0.5 MG PO TABS: 0.5000 mg | ORAL_TABLET | ORAL | Status: DC | PRN

## 2022-09-15 MED ORDER — ACETAMINOPHEN 325 MG PO TABS
650.0000 mg | ORAL_TABLET | Freq: Four times a day (QID) | ORAL | Status: DC | PRN
  Administered 2022-09-15 – 2022-09-18 (×4): 650 mg via ORAL
  Filled 2022-09-15 (×4): qty 2

## 2022-09-15 NOTE — Progress Notes (Signed)
Admissions note: Pt came to the ED on Thursday, per pt, and was sent home to see OP psychiatrist after given a medication. Pt came back to ED by ambulance after pt stated she had a panic attack and took an OD of ativan. Pt stated that in the ED on both days scratched her arms to the point of tear in her skin. MD made aware and neosporin ordered. Pt bandage was placed prior to order. Pt stated she is not currently SI/HI/AVH. Pt came on the unit tearful and was tearful during admission. Pt stated that she cannot stop thinking of her mother passing away in the hospital. Pt mother is not in the hospital currently but was in the past. Pt stated she has ruminating thoughts on that. Pt states that she did well for a while after leaving here in January but her thoughts started to get worse. Pt stated that when she gets really anxious or cannot control her thoughts then she has SI. Pt stated the thoughts got significantly worse in the last week. Pt has been okay since after the admission process. Pt has taken eaten dinner, taken a shower, and is now in the day room. Consents signed, handbook detailing the patient's rights, responsibilities, and visitor guidelines provided. Skin/belongings search completed and patient oriented to unit. Patient stable at this time. Patient given the opportunity to express concerns and ask questions. Patient given toiletries. RN will continue to monitor.   09/15/22 1440  Psych Admission Type (Psych Patients Only)  Admission Status Involuntary  Psychosocial Assessment  Patient Complaints Anxiety;Anger;Crying spells;Depression;Hopelessness;Helplessness;Loneliness;Nervousness;Panic attack;Sadness;Tension;Worrying;Worthlessness;Sleep disturbance;Shakiness  Eye Contact Staring  Facial Expression Trembling lip;Sad;Sullen;Anxious;Worried  Affect Anxious;Depressed;Sad;Sullen  Speech Soft;Logical/coherent  Interaction Guarded  Motor Activity Slow;Shuffling  Appearance/Hygiene Unremarkable;In  scrubs  Behavior Characteristics Cooperative;Appropriate to situation;Anxious;Guarded  Mood Depressed;Anxious;Sad;Sullen  Aggressive Behavior  Effect Self-harm  Thought Process  Coherency WDL  Content Blaming self  Delusions None reported or observed  Perception WDL  Hallucination None reported or observed  Judgment Limited  Confusion None  Danger to Self  Current suicidal ideation? Denies  Danger to Others  Danger to Others None reported or observed

## 2022-09-15 NOTE — ED Notes (Signed)
Pt asked for food to be removed from room. Pt did not eat any of their provided food.

## 2022-09-15 NOTE — ED Notes (Signed)
Nurse talked to Patient and she states " I need help, I am so depressed, she states that she is afraid her Mom is going to die, that she was diagnosed was CHF, Patient talked about struggling with depression for years and she is tired and wants to die, she wishes she never lived, Nurse listened, showed empathy and Nurse will continue to monitor for safety.

## 2022-09-15 NOTE — ED Notes (Signed)
Pt offered crackers and gingerale to help her nausea and pt refused.

## 2022-09-15 NOTE — ED Notes (Addendum)
Pt given phone to call her mother @0902   Pt is crying while having conversation, but is calm at this time.

## 2022-09-15 NOTE — Plan of Care (Signed)
  Problem: Education: Goal: Knowledge of General Education information will improve Description: Including pain rating scale, medication(s)/side effects and non-pharmacologic comfort measures Outcome: Progressing   Problem: Activity: Goal: Risk for activity intolerance will decrease Outcome: Progressing   Problem: Nutrition: Goal: Adequate nutrition will be maintained Outcome: Progressing   Problem: Coping: Goal: Level of anxiety will decrease Outcome: Progressing   Problem: Education: Goal: Knowledge of the prescribed therapeutic regimen will improve Outcome: Progressing   Problem: Coping: Goal: Will verbalize feelings Outcome: Progressing

## 2022-09-15 NOTE — ED Notes (Signed)
Patient using the phone, she remains calm and cooperative.

## 2022-09-15 NOTE — Progress Notes (Signed)
Patient was calm and cooperative throughout shift.  Pt denies SI.  Pt is still somewhat guarded in interactions.  Continued monitoring for safety.  09/15/22 2200  Psych Admission Type (Psych Patients Only)  Admission Status Involuntary  Psychosocial Assessment  Patient Complaints Anxiety;Crying spells;Anger  Eye Contact Staring  Facial Expression Sad;Sullen  Affect Anxious;Depressed  Speech Soft;Logical/coherent  Interaction Guarded  Motor Activity Slow;Shuffling  Appearance/Hygiene Unremarkable  Behavior Characteristics Cooperative  Mood Depressed;Anxious;Sad;Sullen  Aggressive Behavior  Effect Self-harm  Thought Process  Coherency WDL  Content Blaming others  Delusions None reported or observed  Perception WDL  Hallucination None reported or observed  Judgment Limited  Confusion None  Danger to Self  Current suicidal ideation? Passive  Agreement Not to Harm Self Yes  Description of Agreement verbal  Danger to Others  Danger to Others None reported or observed

## 2022-09-15 NOTE — ED Notes (Signed)
Pt began to scratch her arm aggressively. This tech noticed, notified RN. RN and tech to bedside.

## 2022-09-15 NOTE — ED Notes (Signed)
RN was notified by NT that Pt was standing at doorway to room, scratching arm repeatedly. RN visually examined pt's left forearm where pt had been scratching and noticed skin was bright pink and top layer appeared broken. RN cleaned skin with 2x2's w/NS and applied coband to protect area. Pt teary, and stated "if it keeps coming back, I just want it to end." Pt also stated that "I had thought about taking more of the ativan to just end it all." Pt told this RN and NT Emilee that pt has worked with [facility] before and the meds were working well for a while but now they aren't. Pt also stated "I have a psychologist outside of here and see them monthly, but it's not helping."

## 2022-09-15 NOTE — ED Notes (Signed)
Pt up to restroom. Pt is heard coughing as if she is about to throw up. This tech to bathroom door, pt states she is not able to get anything up and feels nauseas. RN on phone at this time but will make aware. Pt continues to cough/gag with head hanging over and into the toilet.

## 2022-09-15 NOTE — ED Notes (Signed)
Pt requesting to take a shower. Shower set up provided with supplies. Pt comes out of bathroom stating "I didn't really get to take a shower. The water never got hot." Pt did get to change into a new set of beh scrubs.   This tech offered a pack of cleansing wipes from the warmer for pt, pt declines after multiple attempts by this tech at convincing. Shower set up and old beh scrubs cleaned up by this tech. Pt back in room at this time

## 2022-09-15 NOTE — ED Notes (Signed)
Pharmacy tech at bedside to do med rec 

## 2022-09-15 NOTE — ED Notes (Signed)
Pt given PRN medication to help with anxiety. RN asked pt if she would like breakfast as the trays just arrived, pt nodded in agreement to breakfast.

## 2022-09-15 NOTE — ED Notes (Signed)
Pt's mother called to check in to make sure pt is getting her meds. This RN told pt's mother that the pt was not awake/alert enough to answer the pharmacy tech's questions last night about what medications pt currently takes. This RN told pt's mother that I have already requested pharmacy to do the med reconciliation again today now that the pt is awake and alert. Pt mother stated that "her issues aren't her fault, because I had a bad ex and all of the trauma that we experienced is where her ptsd comes from."

## 2022-09-15 NOTE — ED Notes (Signed)
Patient is IVC pending placement 

## 2022-09-15 NOTE — ED Notes (Signed)
This RN notified by NT Emilee that pt is banging her head on wall in room. This RN was able to get pt to stop banging head against wall and to move away from corner of room. Pt stated that she hasn't had her psych meds that she normally takes in the morning. This RN told pt that she would request pharmacy to come in and do her medication reconciliation. RN has attempted to call ED pharm tech 4x's with no answer, and ED pharm tech is not sitting at desk in ED currently.

## 2022-09-15 NOTE — ED Notes (Signed)
Pt had an undocumented 18 G IV placed in the R AC. IV was removed by Clinical research associate

## 2022-09-15 NOTE — Group Note (Signed)
LCSW Group Therapy Note   Group Date: 09/15/2022 Start Time: 1400 End Time: 1510   Type of Therapy and Topic:  Group Therapy: AA/NA  Participation Level:  Did Not Attend  Summary of Patient Progress:    Patient did not attend group.   Jesenia Spera S Yer Castello, LCSWA 09/15/2022  3:16 PM    

## 2022-09-15 NOTE — ED Notes (Signed)
Patient is taking shower at this time.  

## 2022-09-15 NOTE — ED Provider Notes (Signed)
Emergency Medicine Observation Re-evaluation Note  Natalie Rose is a 42 y.o. female, seen on rounds today.  Pt initially presented to the ED for complaints of Suicide Attempt Currently, the patient is resting.  Physical Exam  BP 103/67   Pulse (!) 55   Temp 98 F (36.7 C) (Oral)   Resp (!) 21   Ht 1.499 m (4\' 11" )   Wt 75.8 kg   SpO2 95%   BMI 33.75 kg/m  Physical Exam Gen:  No acute distress Resp:  Breathing easily and comfortably, no accessory muscle usage Neuro:  Moving all four extremities, no gross focal neuro deficits Psych:  Resting currently, calm when awake  ED Course / MDM  EKG:   I have reviewed the labs performed to date as well as medications administered while in observation.  Recent changes in the last 24 hours include initial EDP and psychiatry evaluations.  Plan  Current plan is for inpatient psychiatric treatment.    Natalie Rose, MD 09/15/22 [phone]

## 2022-09-15 NOTE — ED Notes (Addendum)
Pt seen by this tech standing in front of the wall in rm 20 with her hands on the wall hitting her head lightly into the wall. Tech and RN to bedside to move patient away from the wall. No injuries or bruising noted to head.

## 2022-09-15 NOTE — ED Notes (Signed)
Patient transferred over to [facility], report received from [facility] Nurse Morrie Sheldon RN

## 2022-09-15 NOTE — Tx Team (Signed)
Initial Treatment Plan 09/15/2022 4:54 PM Natalie Rose [id number]    PATIENT STRESSORS: Other: Thoughts and car issues     PATIENT STRENGTHS: Music therapist for treatment/growth  Supportive family/friends    PATIENT IDENTIFIED PROBLEMS: Persistent thoughts   SI thoughts  Panic attacks                  DISCHARGE CRITERIA:  Improved stabilization in mood, thinking, and/or behavior Verbal commitment to aftercare and medication compliance  PRELIMINARY DISCHARGE PLAN: Outpatient therapy Return to previous living arrangement  PATIENT/FAMILY INVOLVEMENT: This treatment plan has been presented to and reviewed with the patient, Natalie Rose.  The patient has been given the opportunity to ask questions and make suggestions.  Sofie Hartigan, RN 09/15/2022, 4:54 PM

## 2022-09-15 NOTE — Consult Note (Signed)
[facility] Face-to-Face Psychiatry Consult   Reason for Consult:  Ativan overdose Referring Physician:  EDP Patient Identification: Natalie Rose MRN:  [id number] Principal Diagnosis: Major depressive disorder, recurrent severe without psychotic features Diagnosis:  Principal Problem:   Major depressive disorder, recurrent severe without psychotic features   Total Time spent with patient: 45 minutes  Subjective:   Natalie Rose is a 42 y.o. female patient admitted with depression with Ativan overdose.  Today, she is tearful and reports having a panic attack despite having no outward signs.  Prior to the assessment, she was talking to her mother in no distress.  Explained the danger of taking Ativan after her overdose on it and gabapentin ordered to assist her anxiety symptoms.  She continues to have high depression and anxiety, needs inpatient for stabilization.  4/12:  42 yo female presented after taking 8-9 Ativan overdose, drowsy on assessment, basic information obtained.  When asked if it was a suicide attempt, she stated, "I don't know or if was because I was panicking."  Her depression is "not good".  Anxiety is 6/10 with frequent panic attacks.  She was to follow up with her outpatient provider at [facility] today.  Client is being monitored medically at this time, will need psych admission when medically clears.  HPI on 4/11: Patient presents voluntarily to the ED via EMS. Patient reports that she called EMS because her chest was really hurting during a panic attack.     On evaluation, patient is somewhat tearful at times. She denies thought or intent of suicide. She has superficial scratches on left forearm, where she says she scratched herself with her fingernails for a "relief" of some of the anxiety. (Patient did this while in the ED). Patient denies homicidal thoughts, auditory or visual hallucinations. Perceptions intact. She speaks in clear, coherent  sentences. She describes her symptoms of panic attack as "feeling like I can't breathe, heaviness in chest." At time of evaluation she did not present with acute panic symptoms. She reports that she sometimes wakes in her sleep. Patient reports that she was feeling stable when she was  discharged from an inpatient psychiatric stay  (Jan 21- Jul 02, 2022). She is followed by Zorita Pang from [facility] for medication management. She reports her quetiapine was reduced to twice daily from 3 times daily.  Patient reports nothing specific that makes her anxiety worse, although she said she feels it has increased the last 3 days. She says she worries about general life stressors, her mom's health, and financial strain.   She continues to use marijuana but denies other illicit substances. Denies alcohol use.   Hospitalization is unlikely to be of benefit to patient at this time. Discussed at length the importance of getting into seeing a therapist to learn coping/grounding techniques to help control her anxiety. Patient states that her provider Zorita Pang) has put in a referral but no appointment yet. Provider called provider's office and verified this. That office does not have therapists. Writer was advised that patient has an appointment tomorrow (4/12) at 10:40 with Zorita Pang. Patient advised to return to ED if she has worsening symptoms prior to seeing her outpatient provider. Advised to stay with her mother today/tonight. Patient lives alone.     Past Psychiatric History: depression, anxiety  Risk to Self:  yes Risk to Others:  none Prior Inpatient Therapy:  [facility] Prior Outpatient Therapy:  [facility]  Past Medical History:  Past Medical History:  Diagnosis Date  Anxiety    Arthritis    Depression    Diabetes mellitus without complication    Mental health disorder    Migraines     Past Surgical History:  Procedure Laterality Date   MOUTH SURGERY     Family History:   Family History  Problem Relation Age of Onset   Osteopenia Mother    Heart failure Mother    Osteoarthritis Mother    Depression Mother    Diabetes Father    Family Psychiatric  History: see above Social History:  Social History   Substance and Sexual Activity  Alcohol Use Not Currently     Social History   Substance and Sexual Activity  Drug Use Yes   Frequency: 3.0 times per week   Types: Marijuana    Social History   Socioeconomic History   Marital status: Single    Spouse name: Not on file   Number of children: Not on file   Years of education: Not on file   Highest education level: Not on file  Occupational History   Not on file  Tobacco Use   Smoking status: Every Day    Packs/day: 1    Types: Cigarettes   Smokeless tobacco: Never  Vaping Use   Vaping Use: Never used  Substance and Sexual Activity   Alcohol use: Not Currently   Drug use: Yes    Frequency: 3.0 times per week    Types: Marijuana   Sexual activity: Not Currently    Birth control/protection: None  Other Topics Concern   Not on file  Social History Narrative   Not on file   Social Determinants of Health   Financial Resource Strain: Not on file  Food Insecurity: Food Insecurity Present (06/24/2022)   Hunger Vital Sign    Worried About Running Out of Food in the Last Year: Sometimes true    Ran Out of Food in the Last Year: Often true  Transportation Needs: Unmet Transportation Needs (06/24/2022)   PRAPARE - Administrator, Civil Service (Medical): Yes    Lack of Transportation (Non-Medical): Yes  Physical Activity: Not on file  Stress: Not on file  Social Connections: Not on file   Additional Social History:    Allergies:   Allergies  Allergen Reactions   Diazepam Hives, Anxiety, Itching, Other (See Comments) and Rash    unknown unknown    Lurasidone     Labs:  Results for orders placed or performed during the hospital encounter of 09/14/22 (from the past 48  hour(s))  Comprehensive metabolic panel     Status: None   Collection Time: 09/14/22 10:23 AM  Result Value Ref Range   Sodium  135 - 145 mmol/L    SPECIMEN HEMOLYZED. HEMOLYSIS MAY AFFECT INTEGRITY OF RESULTS.    Comment: C/VET HOOKER AT 1120 09/14/22 DAS   Potassium  3.5 - 5.1 mmol/L    SPECIMEN HEMOLYZED. HEMOLYSIS MAY AFFECT INTEGRITY OF RESULTS.   Chloride  98 - 111 mmol/L    SPECIMEN HEMOLYZED. HEMOLYSIS MAY AFFECT INTEGRITY OF RESULTS.   CO2  22 - 32 mmol/L    SPECIMEN HEMOLYZED. HEMOLYSIS MAY AFFECT INTEGRITY OF RESULTS.   Glucose, Bld  70 - 99 mg/dL    SPECIMEN HEMOLYZED. HEMOLYSIS MAY AFFECT INTEGRITY OF RESULTS.    Comment: Glucose reference range applies only to samples taken after fasting for at least 8 hours.   BUN  6 - 20 mg/dL    SPECIMEN HEMOLYZED. HEMOLYSIS MAY  AFFECT INTEGRITY OF RESULTS.   Creatinine, Ser  0.44 - 1.00 mg/dL    SPECIMEN HEMOLYZED. HEMOLYSIS MAY AFFECT INTEGRITY OF RESULTS.   Calcium  8.9 - 10.3 mg/dL    SPECIMEN HEMOLYZED. HEMOLYSIS MAY AFFECT INTEGRITY OF RESULTS.   Total Protein  6.5 - 8.1 g/dL    SPECIMEN HEMOLYZED. HEMOLYSIS MAY AFFECT INTEGRITY OF RESULTS.   Albumin  3.5 - 5.0 g/dL    SPECIMEN HEMOLYZED. HEMOLYSIS MAY AFFECT INTEGRITY OF RESULTS.   AST  15 - 41 U/L    SPECIMEN HEMOLYZED. HEMOLYSIS MAY AFFECT INTEGRITY OF RESULTS.   ALT  0 - 44 U/L    SPECIMEN HEMOLYZED. HEMOLYSIS MAY AFFECT INTEGRITY OF RESULTS.   Alkaline Phosphatase  38 - 126 U/L    SPECIMEN HEMOLYZED. HEMOLYSIS MAY AFFECT INTEGRITY OF RESULTS.   Total Bilirubin  0.3 - 1.2 mg/dL    SPECIMEN HEMOLYZED. HEMOLYSIS MAY AFFECT INTEGRITY OF RESULTS.   GFR, Estimated  >60 mL/min    SPECIMEN HEMOLYZED. HEMOLYSIS MAY AFFECT INTEGRITY OF RESULTS.   Anion gap  5 - 15    SPECIMEN HEMOLYZED. HEMOLYSIS MAY AFFECT INTEGRITY OF RESULTS.    Comment: Performed at [facility], [address] [place], [place] [address]  Ethanol     Status: None   Collection Time: 09/14/22  10:23 AM  Result Value Ref Range   Alcohol, Ethyl (B) <10 <10 mg/dL    Comment: (NOTE) Lowest detectable limit for serum alcohol is 10 mg/dL.  For medical purposes only. Performed at [facility], [address] [place], [place] [address]   Urine Drug Screen, Qualitative     Status: Abnormal   Collection Time: 09/14/22 10:23 AM  Result Value Ref Range   Tricyclic, Ur Screen POSITIVE (A) NONE DETECTED   Amphetamines, Ur Screen NONE DETECTED NONE DETECTED   MDMA (Ecstasy)Ur Screen NONE DETECTED NONE DETECTED   Cocaine Metabolite,Ur [place] NONE DETECTED NONE DETECTED   Opiate, Ur Screen NONE DETECTED NONE DETECTED   Phencyclidine (PCP) Ur S NONE DETECTED NONE DETECTED   Cannabinoid 50 Ng, Ur [place] POSITIVE (A) NONE DETECTED   Barbiturates, Ur Screen NONE DETECTED NONE DETECTED   Benzodiazepine, Ur Scrn POSITIVE (A) NONE DETECTED   Methadone Scn, Ur NONE DETECTED NONE DETECTED    Comment: (NOTE) Tricyclics + metabolites, urine    Cutoff 1000 ng/mL Amphetamines + metabolites, urine  Cutoff 1000 ng/mL MDMA (Ecstasy), urine              Cutoff 500 ng/mL Cocaine Metabolite, urine          Cutoff 300 ng/mL Opiate + metabolites, urine        Cutoff 300 ng/mL Phencyclidine (PCP), urine         Cutoff 25 ng/mL Cannabinoid, urine                 Cutoff 50 ng/mL Barbiturates + metabolites, urine  Cutoff 200 ng/mL Benzodiazepine, urine              Cutoff 200 ng/mL Methadone, urine                   Cutoff 300 ng/mL  The urine drug screen provides only a preliminary, unconfirmed analytical test result and should not be used for non-medical purposes. Clinical consideration and professional judgment should be applied to any positive drug screen result due to possible interfering substances. A more specific alternate chemical method must be used in order to obtain a confirmed  analytical result. Gas chromatography / mass spectrometry (GC/MS) is the preferred confirm atory  method. Performed at [facility], [address] [place], [place] [address]   CBC with Diff     Status: Abnormal   Collection Time: 09/14/22 10:23 AM  Result Value Ref Range   WBC 14.3 (H) 4.0 - 10.5 K/uL    Comment: WHITE COUNT CONFIRMED ON SMEAR   RBC 4.49 3.87 - 5.11 MIL/uL   Hemoglobin 12.6 12.0 - 15.0 g/dL   HCT 60.4 54.0 - 98.1 %   MCV 83.7 80.0 - 100.0 fL   MCH 28.1 26.0 - 34.0 pg   MCHC 33.5 30.0 - 36.0 g/dL   RDW 19.1 47.8 - 29.5 %   Platelets  150 - 400 K/uL    PLATELET CLUMPS NOTED ON SMEAR, COUNT APPEARS ADEQUATE    Comment: SPECIMEN CHECKED FOR CLOTS REPEATED TO VERIFY    nRBC 0.0 0.0 - 0.2 %   Neutrophils Relative % 79 %   Neutro Abs 11.5 (H) 1.7 - 7.7 K/uL   Lymphocytes Relative 15 %   Lymphs Abs 2.1 0.7 - 4.0 K/uL   Monocytes Relative 4 %   Monocytes Absolute 0.5 0.1 - 1.0 K/uL   Eosinophils Relative 1 %   Eosinophils Absolute 0.1 0.0 - 0.5 K/uL   Basophils Relative 0 %   Basophils Absolute 0.1 0.0 - 0.1 K/uL   Immature Granulocytes 1 %   Abs Immature Granulocytes 0.09 (H) 0.00 - 0.07 K/uL    Comment: Performed at [facility], [address] [place], [place] [address]  Salicylate level     Status: Abnormal   Collection Time: 09/14/22 10:23 AM  Result Value Ref Range   Salicylate Lvl <7.0 (L) 7.0 - 30.0 mg/dL    Comment: Performed at [facility], [address] [place], [place] [address]  Acetaminophen level     Status: Abnormal   Collection Time: 09/14/22 10:23 AM  Result Value Ref Range   Acetaminophen (Tylenol), Serum <10 (L) 10 - 30 ug/mL    Comment: (NOTE) Therapeutic concentrations vary significantly. A range of 10-30 ug/mL  may be an effective concentration for many patients. However, some  are best treated at concentrations outside of this range. Acetaminophen concentrations >150 ug/mL at 4 hours after ingestion  and >50 ug/mL at 12 hours after ingestion are often associated with  toxic  reactions.  Performed at [facility], [address] [place], [place] [address]   Urinalysis, Routine w reflex microscopic -Urine, Clean Catch     Status: Abnormal   Collection Time: 09/14/22 10:23 AM  Result Value Ref Range   Color, Urine YELLOW (A) YELLOW   APPearance HAZY (A) CLEAR   Specific Gravity, Urine 1.009 1.005 - 1.030   pH 6.0 5.0 - 8.0   Glucose, UA NEGATIVE NEGATIVE mg/dL   Hgb urine dipstick MODERATE (A) NEGATIVE   Bilirubin Urine NEGATIVE NEGATIVE   Ketones, ur NEGATIVE NEGATIVE mg/dL   Protein, ur NEGATIVE NEGATIVE mg/dL   Nitrite NEGATIVE NEGATIVE   Leukocytes,Ua SMALL (A) NEGATIVE   RBC / HPF 0-5 0 - 5 RBC/hpf   WBC, UA 6-10 0 - 5 WBC/hpf   Bacteria, UA RARE (A) NONE SEEN   Squamous Epithelial / HPF 0-5 0 - 5 /HPF    Comment: Performed at [facility], [address], [place], [place] [address]  POC urine preg, ED     Status: None   Collection Time: 09/14/22 10:40  AM  Result Value Ref Range   Preg Test, Ur NEGATIVE NEGATIVE    Comment:        THE SENSITIVITY OF THIS METHODOLOGY IS >24 mIU/mL   Comprehensive metabolic panel     Status: Abnormal   Collection Time: 09/14/22 11:35 AM  Result Value Ref Range   Sodium 137 135 - 145 mmol/L   Potassium 3.6 3.5 - 5.1 mmol/L   Chloride 105 98 - 111 mmol/L   CO2 21 (L) 22 - 32 mmol/L   Glucose, Bld 119 (H) 70 - 99 mg/dL    Comment: Glucose reference range applies only to samples taken after fasting for at least 8 hours.   BUN 13 6 - 20 mg/dL   Creatinine, Ser 9.24 0.44 - 1.00 mg/dL   Calcium 9.0 8.9 - 26.8 mg/dL   Total Protein 7.3 6.5 - 8.1 g/dL   Albumin 4.3 3.5 - 5.0 g/dL   AST 27 15 - 41 U/L   ALT 31 0 - 44 U/L   Alkaline Phosphatase 56 38 - 126 U/L   Total Bilirubin 0.6 0.3 - 1.2 mg/dL   GFR, Estimated >34 >19 mL/min    Comment: (NOTE) Calculated using the CKD-EPI Creatinine Equation (2021)    Anion gap 11 5 - 15    Comment: Performed at [facility], [address], [place], [place] [address]  SARS Coronavirus 2 by RT PCR (hospital order, performed in [facility] hospital lab) *cepheid single result test* Anterior Nasal Swab     Status: None   Collection Time: 09/15/22 12:49 AM   Specimen: Anterior Nasal Swab  Result Value Ref Range   SARS Coronavirus 2 by RT PCR NEGATIVE NEGATIVE    Comment: (NOTE) SARS-CoV-2 target nucleic acids are NOT DETECTED.  The SARS-CoV-2 RNA is generally detectable in upper and lower respiratory specimens during the acute phase of infection. The lowest concentration of SARS-CoV-2 viral copies this assay can detect is 250 copies / mL. A negative result does not preclude SARS-CoV-2 infection and should not be used as the sole basis for treatment or other patient management decisions.  A negative result may occur with improper specimen collection / handling, submission of specimen other than nasopharyngeal swab, presence of viral mutation(s) within the areas targeted by this assay, and inadequate number of viral copies (<250 copies / mL). A negative result must be combined with clinical observations, patient history, and epidemiological information.  Fact Sheet for Patients:   RoadLapTop.co.za  Fact Sheet for Healthcare Providers: http://kim-miller.com/  This test is not yet approved or  cleared by the [place] FDA and has been authorized for detection and/or diagnosis of SARS-CoV-2 by FDA under an Emergency Use Authorization (EUA).  This EUA will remain in effect (meaning this test can be used) for the duration of the COVID-19 declaration under Section 564(b)(1) of the Act, 21 U.S.C. section 360bbb-3(b)(1), unless the authorization is terminated or revoked sooner.  Performed at [facility], [address] [place], [place] [address]     Current Facility-Administered Medications  Medication Dose Route Frequency Provider Last Rate Last Admin   gabapentin  (NEURONTIN) capsule 300 mg  300 mg Oral TID Charm Rings, NP       hydrOXYzine (ATARAX) tablet 50 mg  50 mg Oral TID PRN Chesley Noon, MD   50 mg at 09/15/22 1194   metFORMIN (GLUCOPHAGE) tablet 500 mg  500 mg Oral BID WC Shaune Pollack, MD   500 mg at  09/15/22 0937   mirtazapine (REMERON) tablet 45 mg  45 mg Oral QHS Shaune Pollack, MD   45 mg at 09/15/22 0023   nicotine (NICODERM CQ - dosed in mg/24 hours) patch 14 mg  14 mg Transdermal Daily Shaune Pollack, MD       sertraline (ZOLOFT) tablet 150 mg  150 mg Oral Daily Shaune Pollack, MD   150 mg at 09/15/22 1610   Current Outpatient Medications  Medication Sig Dispense Refill   atenolol (TENORMIN) 50 MG tablet Take 50 mg by mouth in the morning.     hydrOXYzine (ATARAX) 50 MG tablet Take 1 tablet (50 mg total) by mouth 3 (three) times daily as needed. (Patient taking differently: Take 50-100 mg by mouth 3 (three) times daily as needed for anxiety.) 60 tablet 1   LORazepam (ATIVAN) 0.5 MG tablet Take 0.5-1 mg by mouth as needed for anxiety (severe anxiety).     mirtazapine (REMERON) 45 MG tablet Take 1 tablet (45 mg total) by mouth at bedtime. 30 tablet 1   OLANZapine-Samidorphan (LYBALVI) 10-10 MG TABS Take 1 tablet by mouth daily.     prazosin (MINIPRESS) 2 MG capsule Take 2 capsules (4 mg total) by mouth at bedtime. 60 capsule 1   sertraline (ZOLOFT) 50 MG tablet Take 3 tablets (150 mg total) by mouth daily. 90 tablet 1   traZODone (DESYREL) 100 MG tablet Take 1 tablet (100 mg total) by mouth at bedtime. 30 tablet 1   metFORMIN (GLUCOPHAGE) 500 MG tablet Take 1 tablet (500 mg total) by mouth 2 (two) times daily with a meal. 60 tablet 1   nicotine (NICODERM CQ - DOSED IN MG/24 HOURS) 14 mg/24hr patch Place 1 patch (14 mg total) onto the skin daily. 28 patch 1   OLANZapine (ZYPREXA) 10 MG tablet Take 1 tablet (10 mg total) by mouth at bedtime. (Patient not taking: Reported on 09/14/2022) 30 tablet 1   ondansetron (ZOFRAN ODT) 4 MG  disintegrating tablet Take 1 tablet (4 mg total) by mouth every 8 (eight) hours as needed. 30 tablet 1   promethazine (PHENERGAN) 25 MG tablet Take 1 tablet (25 mg total) by mouth every 6 (six) hours as needed for nausea or vomiting. 30 tablet 1   propranolol (INDERAL) 10 MG tablet Take 1 tablet (10 mg total) by mouth 3 (three) times daily. 90 tablet 1   QUEtiapine (SEROQUEL) 50 MG tablet Take 1 tablet (50 mg total) by mouth 3 (three) times daily. 90 tablet 1   SUMAtriptan (IMITREX) 50 MG tablet Take 50 mg by mouth every 2 (two) hours as needed.      Musculoskeletal: Strength & Muscle Tone: within normal limits Gait & Station: normal Patient leans: N/A  Psychiatric Specialty Exam: Physical Exam Vitals and nursing note reviewed.  Constitutional:      Appearance: Normal appearance.  HENT:     Head: Normocephalic.     Nose: Nose normal.  Pulmonary:     Effort: Pulmonary effort is normal.  Musculoskeletal:        General: Normal range of motion.     Cervical back: Normal range of motion.  Neurological:     General: No focal deficit present.     Mental Status: She is alert and oriented to person, place, and time.  Psychiatric:        Attention and Perception: Attention and perception normal.        Mood and Affect: Mood is anxious and depressed.  Speech: Speech normal.        Behavior: Behavior normal. Behavior is cooperative.        Thought Content: Thought content includes suicidal ideation. Thought content includes suicidal plan.        Cognition and Memory: Cognition and memory normal.        Judgment: Judgment is impulsive.     Review of Systems  Psychiatric/Behavioral:  Positive for depression and suicidal ideas. The patient is nervous/anxious.   All other systems reviewed and are negative.   Blood pressure 103/67, pulse (!) 55, temperature 98 F (36.7 C), temperature source Oral, resp. rate (!) 21, height 4\' 11"  (1.499 m), weight 75.8 kg, SpO2 95 %.Body mass index  is 33.75 kg/m.  General Appearance: Casual  Eye Contact:  Minimal  Speech:  Slow  Volume:  Decreased  Mood:  Anxious and Depressed  Affect:  Congruent  Thought Process:  Coherent  Orientation:  Full (Time, Place, and Person)  Thought Content:  Rumination  Suicidal Thoughts:  Yes.  with intent/plan  Homicidal Thoughts:  No  Memory:  Immediate;   Fair Recent;   Fair Remote;   Fair  Judgement:  Impaired  Insight:  Fair  Psychomotor Activity:  Decreased  Concentration:  Concentration: Fair and Attention Span: Fair  Recall:  [employer] of Knowledge:  Fair  Language:  Fair  Akathisia:  No  Handed:  Right  AIMS (if indicated):     Assets:  Housing Leisure Time Physical Health Resilience  ADL's:  Intact  Cognition:  Impaired,  Mild  Sleep:         Physical Exam: Physical Exam Vitals and nursing note reviewed.  Constitutional:      Appearance: Normal appearance.  HENT:     Head: Normocephalic.     Nose: Nose normal.  Pulmonary:     Effort: Pulmonary effort is normal.  Musculoskeletal:        General: Normal range of motion.     Cervical back: Normal range of motion.  Neurological:     General: No focal deficit present.     Mental Status: She is alert and oriented to person, place, and time.  Psychiatric:        Attention and Perception: Attention and perception normal.        Mood and Affect: Mood is anxious and depressed.        Speech: Speech normal.        Behavior: Behavior normal. Behavior is cooperative.        Thought Content: Thought content includes suicidal ideation. Thought content includes suicidal plan.        Cognition and Memory: Cognition and memory normal.        Judgment: Judgment is impulsive.    Review of Systems  Psychiatric/Behavioral:  Positive for depression and suicidal ideas. The patient is nervous/anxious.   All other systems reviewed and are negative.  Blood pressure 103/67, pulse (!) 55, temperature 98 F (36.7 C), temperature  source Oral, resp. rate (!) 21, height 4\' 11"  (1.499 m), weight 75.8 kg, SpO2 95 %. Body mass index is 33.75 kg/m.  Treatment Plan Summary: Daily contact with patient to assess and evaluate symptoms and progress in treatment, Medication management, and Plan : Major depressive disorder, recurrent, severe without psychosis: Zoloft 150 mg daily  Insomnia: Remeron 45 mg decreased to 15 mg at bedtime as it is more effective for sleep at the lower doses  Anxiety: Gabapentin 300 mg TID Hydroxyzine  50 mg TID PRN  Disposition: Recommend psychiatric Inpatient admission when medically cleared.  Nanine Means, NP 09/15/2022 9:42 AM

## 2022-09-16 DIAGNOSIS — F332 Major depressive disorder, recurrent severe without psychotic features: Secondary | ICD-10-CM | POA: Diagnosis not present

## 2022-09-16 DIAGNOSIS — F429 Obsessive-compulsive disorder, unspecified: Secondary | ICD-10-CM | POA: Insufficient documentation

## 2022-09-16 MED ORDER — LORAZEPAM 2 MG PO TABS
2.0000 mg | ORAL_TABLET | ORAL | Status: AC
  Administered 2022-09-16: 2 mg via ORAL
  Filled 2022-09-16: qty 1

## 2022-09-16 MED ORDER — LORAZEPAM 2 MG PO TABS: 2.0000 mg | ORAL_TABLET | Freq: Four times a day (QID) | ORAL | Status: AC | PRN

## 2022-09-16 MED ORDER — CLONAZEPAM 0.5 MG PO TABS
0.5000 mg | ORAL_TABLET | Freq: Two times a day (BID) | ORAL | Status: DC
  Administered 2022-09-16 – 2022-09-21 (×10): 0.5 mg via ORAL
  Filled 2022-09-16 (×10): qty 1

## 2022-09-16 MED ORDER — OLANZAPINE 10 MG PO TABS
10.0000 mg | ORAL_TABLET | Freq: Every day | ORAL | Status: DC
  Administered 2022-09-16 – 2022-09-20 (×5): 10 mg via ORAL
  Filled 2022-09-16: qty 2
  Filled 2022-09-16 (×4): qty 1

## 2022-09-16 MED ORDER — PROPRANOLOL HCL 20 MG PO TABS
10.0000 mg | ORAL_TABLET | Freq: Three times a day (TID) | ORAL | Status: DC
  Administered 2022-09-16 – 2022-09-21 (×13): 10 mg via ORAL
  Filled 2022-09-16 (×15): qty 1

## 2022-09-16 MED ORDER — QUETIAPINE FUMARATE 25 MG PO TABS
50.0000 mg | ORAL_TABLET | Freq: Two times a day (BID) | ORAL | Status: DC
  Administered 2022-09-16 – 2022-09-21 (×10): 50 mg via ORAL
  Filled 2022-09-16 (×10): qty 2

## 2022-09-16 MED ORDER — MIRTAZAPINE 15 MG PO TABS
45.0000 mg | ORAL_TABLET | Freq: Every day | ORAL | Status: DC
  Administered 2022-09-16 – 2022-09-20 (×5): 45 mg via ORAL
  Filled 2022-09-16 (×5): qty 3

## 2022-09-16 MED ORDER — PROMETHAZINE HCL 25 MG PO TABS: 25.0000 mg | ORAL_TABLET | Freq: Four times a day (QID) | ORAL | Status: DC | PRN

## 2022-09-16 MED ORDER — ATENOLOL 50 MG PO TABS
50.0000 mg | ORAL_TABLET | Freq: Every day | ORAL | Status: DC
  Administered 2022-09-17 – 2022-09-21 (×5): 50 mg via ORAL
  Filled 2022-09-16 (×5): qty 1

## 2022-09-16 MED ORDER — PRAZOSIN HCL 2 MG PO CAPS
4.0000 mg | ORAL_CAPSULE | Freq: Every day | ORAL | Status: DC
  Administered 2022-09-16 – 2022-09-19 (×4): 4 mg via ORAL
  Filled 2022-09-16 (×5): qty 2

## 2022-09-16 NOTE — [facility] Suicide Risk Assessment (Signed)
[facility] Admission Suicide Risk Assessment   Nursing information obtained from:    Demographic factors:  Caucasian, Low socioeconomic status, Living alone, Unemployed Current Mental Status:  Suicidal ideation indicated by patient, Self-harm thoughts, Self-harm behaviors Loss Factors:  NA Historical Factors:  Prior suicide attempts Risk Reduction Factors:  Positive social support  Total Time spent with patient: 45 minutes Principal Problem: Major depressive disorder, recurrent severe without psychotic features Diagnosis:  Principal Problem:   Major depressive disorder, recurrent severe without psychotic features Active Problems:   PTSD (post-traumatic stress disorder)   Self-inflicted laceration of wrist   Cannabis abuse   Panic attack   OCD (obsessive compulsive disorder)  Subjective Data: Patient seen and chart reviewed.  42 year old woman with a history of anxiety and depression came to the hospital after taking an overdose of what she describes as "8 or 9" tablets of Ativan.  Patient tells me that she did this trying to make her anxiety better and not wanting to die.  Describes overwhelming anxiety poor sleep depression and negative thoughts for the last couple days.  Currently lucid and cooperative with treatment  Continued Clinical Symptoms:  Alcohol Use Disorder Identification Test Final Score (AUDIT): 0 The "Alcohol Use Disorders Identification Test", Guidelines for Use in Primary Care, Second Edition.  World Science writer [facility]). Score between 0-7:  no or low risk or alcohol related problems. Score between 8-15:  moderate risk of alcohol related problems. Score between 16-19:  high risk of alcohol related problems. Score 20 or above:  warrants further diagnostic evaluation for alcohol dependence and treatment.   CLINICAL FACTORS:   Severe Anxiety and/or Agitation Depression:   Impulsivity   Musculoskeletal: Strength & Muscle Tone: within normal limits Gait & Station:  normal Patient leans: N/A  Psychiatric Specialty Exam:  Presentation  General Appearance:  Casual  Eye Contact: Good  Speech: Clear and Coherent  Speech Volume: Normal  Handedness:No data recorded  Mood and Affect  Mood: Anxious  Affect: Congruent   Thought Process  Thought Processes: Coherent  Descriptions of Associations:Intact  Orientation:Full (Time, Place and Person)  Thought Content:WDL  History of Schizophrenia/Schizoaffective disorder:No  Duration of Psychotic Symptoms:No data recorded Hallucinations:No data recorded Ideas of Reference:None  Suicidal Thoughts:No data recorded Homicidal Thoughts:No data recorded  Sensorium  Memory: Immediate Good  Judgment: Fair  Insight: Fair   Art therapist  Concentration: Good  Attention Span: Good  Recall: Good  Fund of Knowledge: Good  Language: Good   Psychomotor Activity  Psychomotor Activity:No data recorded  Assets  Assets: Desire for Improvement   Sleep  Sleep:No data recorded   Physical Exam: Physical Exam Vitals and nursing note reviewed.  Constitutional:      Appearance: Normal appearance.  HENT:     Head: Normocephalic and atraumatic.     Mouth/Throat:     Pharynx: Oropharynx is clear.  Eyes:     Pupils: Pupils are equal, round, and reactive to light.  Cardiovascular:     Rate and Rhythm: Normal rate and regular rhythm.  Pulmonary:     Effort: Pulmonary effort is normal.     Breath sounds: Normal breath sounds.  Abdominal:     General: Abdomen is flat.     Palpations: Abdomen is soft.  Musculoskeletal:        General: Normal range of motion.  Skin:    General: Skin is warm and dry.  Neurological:     General: No focal deficit present.     Mental Status: She  is alert. Mental status is at baseline.  Psychiatric:        Attention and Perception: She is inattentive.        Mood and Affect: Mood is anxious. Affect is tearful.        Speech: Speech  normal.        Behavior: Behavior is agitated. Behavior is not aggressive. Behavior is cooperative.        Thought Content: Thought content normal.        Cognition and Memory: Cognition normal.        Judgment: Judgment is impulsive.    Review of Systems  Constitutional: Negative.   HENT: Negative.    Eyes: Negative.   Respiratory: Negative.    Cardiovascular: Negative.   Gastrointestinal: Negative.   Musculoskeletal: Negative.   Skin: Negative.   Neurological: Negative.   Psychiatric/Behavioral:  Positive for depression and substance abuse. The patient is nervous/anxious and has insomnia.    Blood pressure (!) 145/99, pulse 95, temperature 98 F (36.7 C), temperature source Oral, resp. rate 18, height  (1.626 m), weight 75.1 kg, SpO2 98 %. Body mass index is 28.41 kg/m.   COGNITIVE FEATURES THAT CONTRIBUTE TO RISK:  Thought constriction (tunnel vision)    SUICIDE RISK:   Mild:  Suicidal ideation of limited frequency, intensity, duration, and specificity.  There are no identifiable plans, no associated intent, mild dysphoria and related symptoms, good self-control (both objective and subjective assessment), few other risk factors, and identifiable protective factors, including available and accessible social support.  PLAN OF CARE: Continue 15-minute checks.  Restart all medicine.  Acute treatment for anxiety.  Ongoing daily assessment and engagement in appropriate activities with assessment for treatment needs as appropriate.  Assess dangerousness prior to any discharge planning.  I certify that inpatient services furnished can reasonably be expected to improve the patient's condition.   Mordecai Rasmussen, MD 09/16/2022, 11:23 AM

## 2022-09-16 NOTE — Plan of Care (Signed)
Patient reports passive SI without a plan. Pt is monitored for self injury behavior. Pt has been scratching left forearm. Currently arm has dressing applied to reduce self arm behavior. Denies HI / AVH. Pt is seen in milieu following staff around crying. Pt is redirectable but is childlike in needs. Staff will continue to monitor patient for self injury behavior and safety Q15.    Problem: Education: Goal: Knowledge of General Education information will improve Description: Including pain rating scale, medication(s)/side effects and non-pharmacologic comfort measures Outcome: Not Progressing   Problem: Coping: Goal: Level of anxiety will decrease Outcome: Not Progressing   Problem: Safety: Goal: Ability to remain free from injury will improve Outcome: Not Progressing

## 2022-09-16 NOTE — Progress Notes (Signed)
Spoke with Pharmacy about Atenolol order being held by pharmacy. Pharmacy stated that there is a current message to the provider to clarify order.
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
## 2022-09-16 NOTE — Progress Notes (Signed)
   09/16/22 1115  Spiritual Encounters  Type of Visit Initial  Care provided to: Patient  Referral source Patient request  Reason for visit Routine spiritual support  OnCall Visit Yes  Spiritual Framework  Presenting Themes Coping tools  Needs/Challenges/Barriers Fear  Patient Stress Factors Family relationships  Interventions  Spiritual Care Interventions Made Compassionate presence;Reflective listening;Prayer;Encouragement  Intervention Outcomes  Outcomes Reduced fear   Chaplain responded to patient request for prayer. Patient expressed fear of losing her mom and this thought consumed visit. Chaplain listed and allowed patient to express herself and interjected questions of other things the patient like to do. This interaction and prayer seemed to briefly help draw attention away from fear. However, by the end of visit, patient had returned to her fear. Chaplain services available upon request. Patient indicated that another visit would be appreciated.

## 2022-09-16 NOTE — H&P (Signed)
Psychiatric Admission Assessment Adult  Patient Identification: Natalie Rose MRN:  [id number] Date of Evaluation:  09/16/2022 Chief Complaint:  Major depressive disorder, recurrent severe without psychotic features [F33.2] Principal Diagnosis: Major depressive disorder, recurrent severe without psychotic features Diagnosis:  Principal Problem:   Major depressive disorder, recurrent severe without psychotic features Active Problems:   PTSD (post-traumatic stress disorder)   Self-inflicted laceration of wrist   Cannabis abuse   Panic attack   OCD (obsessive compulsive disorder)  History of Present Illness: Patient seen and chart reviewed.  42 year old woman with a history of anxiety depression and posttraumatic stress disorder.  Patient says that she has been feeling much more anxious and panicky for the past several days.  The chief symptom she describes is an obsessive like feeling that there is something almost like a film running around and around in her head about her mother dying.  Mother has chronic medical problems but is not dead and has not had any worsening issues recently but the patient nevertheless is having obsessive thoughts.  She has not been able to sleep well for a few days.  She denies that she was trying to kill her self but says that she took about 8 or 9 tablets of Ativan that she had left over from a previous prescription in an attempt to decrease her anxiety.  She did this after having tried to contact her regular providers and still waiting for a call.  Patient then called an ambulance and had herself brought to the ER.  She is describing no suicidal ideation currently and no homicidal ideation and denies any hallucinations.  Cooperative with treatment.  Patient during the last couple days also had been scratching her left arm using her right hand fingernails.  Not attempting to kill her self but ended up self-mutilating due to scratching.  Uses cannabis daily.   Denies other drug use.  Patient does have [facility] mental health [facility]. Associated Signs/Symptoms: Depression Symptoms:  depressed mood, insomnia, difficulty concentrating, anxiety, panic attacks, disturbed sleep, (Hypo) Manic Symptoms:  Impulsivity, Anxiety Symptoms:  Excessive Worry, Panic Symptoms, Obsessive Compulsive Symptoms:   Intrusive recurrent obsessions, Psychotic Symptoms:   Denies a PTSD Symptoms: Patient has a diagnosis of PTSD.  Current panicky and OCD symptoms could be conceptualized as part of the overall syndrome although not specifically as far as I can tell. Total Time spent with patient: 45 minutes  Past Psychiatric History: Patient has a history of anxiety and depression.  Has had prior suicidality prior anxiety multiple medical trials prior inpatient treatment.  History of excessive use of cannabis.  Is the patient at risk to self? Yes.    Has the patient been a risk to self in the past 6 months? Yes.    Has the patient been a risk to self within the distant past? Yes.    Is the patient a risk to others? No.  Has the patient been a risk to others in the past 6 months? No.  Has the patient been a risk to others within the distant past? No.   [place] Scale:  Flowsheet Row Admission (Current) from 09/15/2022 in [facility] INPATIENT BEHAV[facility]AL MEDICINE ED from 09/14/2022 in [facility] Emergency Department at [facility] ED from 09/13/2022 in [facility] Emergency Department at [facility]  C-SSRS RISK CATEGORY High Risk High Risk Error: Q3, 4, or 5 should not be populated when Q2 is No        Prior Inpatient Therapy:  Yes.   If yes, describe was in the hospital here just fairly recently Prior Outpatient Therapy: Yes.   If yes, describe goes to [facility]  Alcohol Screening: 1. How often do you have a drink containing alcohol?: Never 2. How many drinks containing alcohol do you have on a typical day when you are drinking?:  1 or 2 3. How often do you have six or more drinks on one occasion?: Never AUDIT-C Score: 0 4. How often during the last year have you found that you were not able to stop drinking once you had started?: Never 5. How often during the last year have you failed to do what was normally expected from you because of drinking?: Never 6. How often during the last year have you needed a first drink in the morning to get yourself going after a heavy drinking session?: Never 7. How often during the last year have you had a feeling of guilt of remorse after drinking?: Never 8. How often during the last year have you been unable to remember what happened the night before because you had been drinking?: Never 9. Have you or someone else been injured as a result of your drinking?: No 10. Has a relative or friend or a doctor or another health worker been concerned about your drinking or suggested you cut down?: No Alcohol Use Disorder Identification Test Final Score (AUDIT): 0 Alcohol Brief Interventions/Follow-up: Alcohol education/Brief advice Substance Abuse History in the last 12 months:  Yes.   Consequences of Substance Abuse: I think that cannabis use although to her mind being done therapeutically is probably contributing to the resilience of her symptoms.  That is the only negative I can see. Previous Psychotropic Medications: Yes  Psychological Evaluations: Yes  Past Medical History:  Past Medical History:  Diagnosis Date   Anxiety    Arthritis    Depression    Diabetes mellitus without complication    Mental health disorder    Migraines     Past Surgical History:  Procedure Laterality Date   MOUTH SURGERY     Family History:  Family History  Problem Relation Age of Onset   Osteopenia Mother    Heart failure Mother    Osteoarthritis Mother    Depression Mother    Diabetes Father    Family Psychiatric  History: Depression in mother Tobacco Screening:  Social History   Tobacco Use   Smoking Status Every Day   Packs/day: 1   Types: Cigarettes  Smokeless Tobacco Never    BH Tobacco Counseling     Are you interested in Tobacco Cessation Medications?  No, patient refused Counseled patient on smoking cessation:  Refused/Declined practical counseling Reason Tobacco Screening Not Completed: No value filed.       Social History:  Social History   Substance and Sexual Activity  Alcohol Use Not Currently     Social History   Substance and Sexual Activity  Drug Use Yes   Frequency: 3.0 times per week   Types: Marijuana    Additional Social History: Marital status: Single Has your sexual activity been affected by drugs, alcohol, medication, or emotional stress?: no Does patient have children?: No                         Allergies:   Allergies  Allergen Reactions   Diazepam Hives, Anxiety, Itching, Other (See Comments) and Rash    unknown unknown    Lurasidone  Lab Results:  Results for orders placed or performed during the hospital encounter of 09/14/22 (from the past 48 hour(s))  Comprehensive metabolic panel     Status: Abnormal   Collection Time: 09/14/22 11:35 AM  Result Value Ref Range   Sodium 137 135 - 145 mmol/L   Potassium 3.6 3.5 - 5.1 mmol/L   Chloride 105 98 - 111 mmol/L   CO2 21 (L) 22 - 32 mmol/L   Glucose, Bld 119 (H) 70 - 99 mg/dL    Comment: Glucose reference range applies only to samples taken after fasting for at least 8 hours.   BUN 13 6 - 20 mg/dL   Creatinine, Ser 8.11 0.44 - 1.00 mg/dL   Calcium 9.0 8.9 - 91.4 mg/dL   Total Protein 7.3 6.5 - 8.1 g/dL   Albumin 4.3 3.5 - 5.0 g/dL   AST 27 15 - 41 U/L   ALT 31 0 - 44 U/L   Alkaline Phosphatase 56 38 - 126 U/L   Total Bilirubin 0.6 0.3 - 1.2 mg/dL   GFR, Estimated >78 >29 mL/min    Comment: (NOTE) Calculated using the CKD-EPI Creatinine Equation (2021)    Anion gap 11 5 - 15    Comment: Performed at [facility], [address] [place],  [place] [address]  SARS Coronavirus 2 by RT PCR (hospital order, performed in [facility] hospital lab) *cepheid single result test* Anterior Nasal Swab     Status: None   Collection Time: 09/15/22 12:49 AM   Specimen: Anterior Nasal Swab  Result Value Ref Range   SARS Coronavirus 2 by RT PCR NEGATIVE NEGATIVE    Comment: (NOTE) SARS-CoV-2 target nucleic acids are NOT DETECTED.  The SARS-CoV-2 RNA is generally detectable in upper and lower respiratory specimens during the acute phase of infection. The lowest concentration of SARS-CoV-2 viral copies this assay can detect is 250 copies / mL. A negative result does not preclude SARS-CoV-2 infection and should not be used as the sole basis for treatment or other patient management decisions.  A negative result may occur with improper specimen collection / handling, submission of specimen other than nasopharyngeal swab, presence of viral mutation(s) within the areas targeted by this assay, and inadequate number of viral copies (<250 copies / mL). A negative result must be combined with clinical observations, patient history, and epidemiological information.  Fact Sheet for Patients:   RoadLapTop.co.za  Fact Sheet for Healthcare Providers: http://kim-miller.com/  This test is not yet approved or  cleared by the [place] FDA and has been authorized for detection and/or diagnosis of SARS-CoV-2 by FDA under an Emergency Use Authorization (EUA).  This EUA will remain in effect (meaning this test can be used) for the duration of the COVID-19 declaration under Section 564(b)(1) of the Act, 21 U.S.C. section 360bbb-3(b)(1), unless the authorization is terminated or revoked sooner.  Performed at [facility], [address] [place], [place] [address]     Blood Alcohol level:  Lab Results  Component Value Date   [facility] <10 09/14/2022   ETH <10 09/13/2022    Metabolic Disorder Labs:  Lab  Results  Component Value Date   HGBA1C 5.7 (H) 06/29/2022   MPG 116.89 06/29/2022   MPG 162.81 12/16/2020   No results found for: "PROLACTIN" Lab Results  Component Value Date   CHOL 215 (H) 06/29/2022   TRIG 212 (H) 06/29/2022   HDL 29 (L) 06/29/2022   CHOLHDL 7.4 06/29/2022   VLDL 42 (H)  06/29/2022   LDLCALC 144 (H) 06/29/2022   LDLCALC 132 (H) 12/16/2020    Current Medications: Current Facility-Administered Medications  Medication Dose Route Frequency Provider Last Rate Last Admin   acetaminophen (TYLENOL) tablet 650 mg  650 mg Oral Q6H PRN Charm Rings, NP   650 mg at 09/15/22 1509   alum & mag hydroxide-simeth (MAALOX/MYLANTA) 200-200-20 MG/5ML suspension 30 mL  30 mL Oral Q4H PRN Charm Rings, NP       atenolol (TENORMIN) tablet 50 mg  50 mg Oral Daily Blase Beckner, [name], MD       clonazePAM Scarlette Calico) tablet 0.5 mg  0.5 mg Oral BID Carleen Rhue, [name], MD       gabapentin (NEURONTIN) capsule 300 mg  300 mg Oral TID Charm Rings, NP   300 mg at 09/16/22 1112   hydrOXYzine (ATARAX) tablet 50 mg  50 mg Oral TID PRN Charm Rings, NP   50 mg at 09/15/22 2119   LORazepam (ATIVAN) tablet 2 mg  2 mg Oral Q6H PRN Laddie Math, [name], MD       magnesium hydroxide (MILK OF MAGNESIA) suspension 30 mL  30 mL Oral Daily PRN Charm Rings, NP       metFORMIN (GLUCOPHAGE) tablet 500 mg  500 mg Oral BID WC Charm Rings, NP   500 mg at 09/16/22 0747   mirtazapine (REMERON) tablet 45 mg  45 mg Oral QHS Enrica Corliss, [name], MD       neomycin-bacitracin-polymyxin (NEOSPORIN) ointment   Topical BID Fleta Borgeson, [name], MD   1 Application at 09/16/22 0755   nicotine (NICODERM CQ - dosed in mg/24 hours) patch 14 mg  14 mg Transdermal Daily Charm Rings, NP       OLANZapine (ZYPREXA) tablet 10 mg  10 mg Oral BID PRN Charm Rings, NP       Or   OLANZapine (ZYPREXA) injection 10 mg  10 mg Intramuscular BID PRN Charm Rings, NP       OLANZapine (ZYPREXA) tablet 10 mg  10 mg Oral QHS Endrit Gittins,  [name], MD       prazosin (MINIPRESS) capsule 4 mg  4 mg Oral QHS Miracle Mongillo, [name], MD       promethazine (PHENERGAN) tablet 25 mg  25 mg Oral Q6H PRN Ritchard Paragas, [name], MD       propranolol (INDERAL) tablet 10 mg  10 mg Oral TID Meadow Abramo, [name], MD       QUEtiapine (SEROQUEL) tablet 50 mg  50 mg Oral BID Tyrone Pautsch, [name], MD       sertraline (ZOLOFT) tablet 150 mg  150 mg Oral Daily Charm Rings, NP   150 mg at 09/16/22 0747   traZODone (DESYREL) tablet 100 mg  100 mg Oral QHS Charm Rings, NP   100 mg at 09/15/22 2119   PTA Medications: Medications Prior to Admission  Medication Sig Dispense Refill Last Dose   atenolol (TENORMIN) 50 MG tablet Take 50 mg by mouth in the morning.      hydrOXYzine (ATARAX) 50 MG tablet Take 1 tablet (50 mg total) by mouth 3 (three) times daily as needed. (Patient taking differently: Take 50-100 mg by mouth 3 (three) times daily as needed for anxiety.) 60 tablet 1    metFORMIN (GLUCOPHAGE) 500 MG tablet Take 1 tablet (500 mg total) by mouth 2 (two) times daily with a meal. 60 tablet 1    mirtazapine (REMERON)  45 MG tablet Take 1 tablet (45 mg total) by mouth at bedtime. 30 tablet 1    nicotine (NICODERM CQ - DOSED IN MG/24 HOURS) 14 mg/24hr patch Place 1 patch (14 mg total) onto the skin daily. (Patient not taking: Reported on 09/15/2022) 28 patch 1    OLANZapine (ZYPREXA) 10 MG tablet Take 1 tablet (10 mg total) by mouth at bedtime. 30 tablet 1    OLANZapine-Samidorphan (LYBALVI) 10-10 MG TABS Take 1 tablet by mouth daily. (Patient not taking: Reported on 09/15/2022)      ondansetron (ZOFRAN ODT) 4 MG disintegrating tablet Take 1 tablet (4 mg total) by mouth every 8 (eight) hours as needed. (Patient not taking: Reported on 09/15/2022) 30 tablet 1    prazosin (MINIPRESS) 2 MG capsule Take 2 capsules (4 mg total) by mouth at bedtime. 60 capsule 1    promethazine (PHENERGAN) 25 MG tablet Take 1 tablet (25 mg total) by mouth every 6 (six) hours as needed for nausea or  vomiting. (Patient not taking: Reported on 09/15/2022) 30 tablet 1    propranolol (INDERAL) 10 MG tablet Take 1 tablet (10 mg total) by mouth 3 (three) times daily. (Patient taking differently: Take 10 mg by mouth 2 (two) times daily.) 90 tablet 1    QUEtiapine (SEROQUEL) 50 MG tablet Take 1 tablet (50 mg total) by mouth 3 (three) times daily. (Patient taking differently: Take 50 mg by mouth 2 (two) times daily.) 90 tablet 1    sertraline (ZOLOFT) 50 MG tablet Take 3 tablets (150 mg total) by mouth daily. 90 tablet 1    SUMAtriptan (IMITREX) 50 MG tablet Take 50 mg by mouth every 2 (two) hours as needed. (Patient not taking: Reported on 09/15/2022)      traZODone (DESYREL) 100 MG tablet Take 1 tablet (100 mg total) by mouth at bedtime. 30 tablet 1     Musculoskeletal: Strength & Muscle Tone: within normal limits Gait & Station: normal Patient leans: N/A            Psychiatric Specialty Exam:  Presentation  General Appearance:  Casual  Eye Contact: Good  Speech: Clear and Coherent  Speech Volume: Normal  Handedness:No data recorded  Mood and Affect  Mood: Anxious  Affect: Congruent   Thought Process  Thought Processes: Coherent  Duration of Psychotic Symptoms:N/A Past Diagnosis of Schizophrenia or Psychoactive disorder: No  Descriptions of Associations:Intact  Orientation:Full (Time, Place and Person)  Thought Content:WDL  Hallucinations:No data recorded Ideas of Reference:None  Suicidal Thoughts:No data recorded Homicidal Thoughts:No data recorded  Sensorium  Memory: Immediate Good  Judgment: Fair  Insight: Fair   Art therapist  Concentration: Good  Attention Span: Good  Recall: Good  Fund of Knowledge: Good  Language: Good   Psychomotor Activity  Psychomotor Activity:No data recorded  Assets  Assets: Desire for Improvement   Sleep  Sleep:No data recorded   Physical Exam: Physical Exam Vitals and nursing  note reviewed.  Constitutional:      Appearance: Normal appearance.  HENT:     Head: Normocephalic and atraumatic.     Mouth/Throat:     Pharynx: Oropharynx is clear.  Eyes:     Pupils: Pupils are equal, round, and reactive to light.  Cardiovascular:     Rate and Rhythm: Normal rate and regular rhythm.  Pulmonary:     Effort: Pulmonary effort is normal.     Breath sounds: Normal breath sounds.  Abdominal:     General: Abdomen is flat.  Palpations: Abdomen is soft.  Musculoskeletal:        General: Normal range of motion.  Skin:    General: Skin is warm and dry.  Neurological:     General: No focal deficit present.     Mental Status: She is alert. Mental status is at baseline.  Psychiatric:        Attention and Perception: Attention normal.        Mood and Affect: Mood is anxious. Affect is tearful.        Speech: Speech normal.        Behavior: Behavior is agitated. Behavior is not aggressive.        Thought Content: Thought content normal. Thought content does not include suicidal ideation.        Cognition and Memory: Cognition normal.        Judgment: Judgment is impulsive.    Review of Systems  Constitutional: Negative.   HENT: Negative.    Eyes: Negative.   Respiratory: Negative.    Cardiovascular: Negative.   Gastrointestinal: Negative.   Musculoskeletal: Negative.   Skin: Negative.   Neurological: Negative.   Psychiatric/Behavioral:  Positive for depression and substance abuse. The patient is nervous/anxious and has insomnia.    Blood pressure (!) 145/99, pulse 95, temperature 98 F (36.7 C), temperature source Oral, resp. rate 18, height  (1.626 m), weight 75.1 kg, SpO2 98 %. Body mass index is 28.41 kg/m.  Treatment Plan Summary: Medication management and Plan reviewed medications with patient.  She tells me she had been out of her olanzapine recently but cannot recall for how long and denies any other changes in medicine.  Not clear that there had  been any actual stress in her life that had triggered worsening symptoms.  I have reviewed medicine with patient and we will try to get her restarted back on what she was on routinely.  I have given her Ativan 2 mg once now for the acute anxiety she is having and also started her on standing dose of clonazepam.  Ongoing assessment of dangerousness.  Labs will all be updated and reviewed.  Treatment team tomorrow  Observation Level/Precautions:  15 minute checks  Laboratory:  Chemistry Profile  Psychotherapy:    Medications:    Consultations:    Discharge Concerns:    Estimated LOS:  Other:     Physician Treatment Plan for Primary Diagnosis: Major depressive disorder, recurrent severe without psychotic features Long Term Goal(s): Improvement in symptoms so as ready for discharge  Short Term Goals: Ability to verbalize feelings will improve, Ability to demonstrate self-control will improve, Ability to identify and develop effective coping behaviors will improve, and Compliance with prescribed medications will improve  Physician Treatment Plan for Secondary Diagnosis: Principal Problem:   Major depressive disorder, recurrent severe without psychotic features Active Problems:   PTSD (post-traumatic stress disorder)   Self-inflicted laceration of wrist   Cannabis abuse   Panic attack   OCD (obsessive compulsive disorder)  Long Term Goal(s): Improvement in symptoms so as ready for discharge  Short Term Goals: Ability to identify triggers associated with substance abuse/mental health issues will improve  I certify that inpatient services furnished can reasonably be expected to improve the patient's condition.    Mordecai Rasmussen, MD 4/14/202411:26 AM

## 2022-09-16 NOTE — Progress Notes (Signed)
Patient continues to have self injury behavior. Patient is rubbing, scratching, digging at left forearm. Patients left arm was assessed, area is red and raised, no bleeding, no signs of infection. Area is dressed with nonadherent and coban wrap.

## 2022-09-16 NOTE — Progress Notes (Signed)
Sent message to pharmacy to verify orders. Several meds are over due but unable to administer because of pharmacy verification process at this time.

## 2022-09-16 NOTE — Progress Notes (Signed)
atenolol (TENORMIN) tablet 50 still unverified by pharmacy. Pharmacy reports that they are waiting for reply from provider.

## 2022-09-16 NOTE — Progress Notes (Signed)
Left arm wound dressed with nonadherent and coband

## 2022-09-16 NOTE — Progress Notes (Signed)
Verified Atenolol and Propanolol dual therapy order with Provider. Provider confirmed orders. Patient continues to be tearful. Pt was seen by chaplain.

## 2022-09-16 NOTE — Group Note (Signed)
[facility] LCSW Group Therapy Note   Group Date: 09/16/2022 Start Time: 1330 End Time: 1400  Type of Therapy/Topic:  Group Therapy:  Feelings about Diagnosis  Participation Level:  Active      Description of Group:    This group will allow patients to explore their thoughts and feelings about diagnoses they have received. Patients will be guided to explore their level of understanding and acceptance of these diagnoses. Facilitator will encourage patients to process their thoughts and feelings about the reactions of others to their diagnosis, and will guide patients in identifying ways to discuss their diagnosis with significant others in their lives. This group will be process-oriented, with patients participating in exploration of their own experiences as well as giving and receiving support and challenge from other group members.   Therapeutic Goals: 1. Patient will demonstrate understanding of diagnosis as evidence by identifying two or more symptoms of the disorder:  2. Patient will be able to express two feelings regarding the diagnosis 3. Patient will demonstrate ability to communicate their needs through discussion and/or role plays  Summary of Patient Progress:  The patient attended group. The patient stated that she is fine with her diagnosis but don't like the symptoms of her diagnosis.       Therapeutic Modalities:   Cognitive Behavioral Therapy Brief Therapy Feelings Identification    Marshell Levan, LCSW

## 2022-09-17 DIAGNOSIS — F332 Major depressive disorder, recurrent severe without psychotic features: Secondary | ICD-10-CM | POA: Diagnosis not present

## 2022-09-17 LAB — LIPID PANEL
Cholesterol: 201 mg/dL — ABNORMAL HIGH (ref 0–200)
HDL: 35 mg/dL — ABNORMAL LOW (ref >40)
LDL Cholesterol: 146 mg/dL — ABNORMAL HIGH (ref 0–99)
Total CHOL/HDL Ratio: 5.7 RATIO
Triglycerides: 100 mg/dL (ref <150)
VLDL: 20 mg/dL (ref 0–40)

## 2022-09-17 LAB — HEMOGLOBIN A1C
Hgb A1c MFr Bld: 6.2 % — ABNORMAL HIGH (ref 4.8–5.6)
Mean Plasma Glucose: 131.24 mg/dL

## 2022-09-17 NOTE — Progress Notes (Signed)
Pt affect is depressed but her anxiety is less intense today.  Pt states that the MD was able to give her more of her home medications that she is accustomed to.  Pt was interactive with this Clinical research associate regarding medication uses and her progress.  Pt said that her worst time for anxiety is the morning.  Continued monitoring for safety.  09/17/22 0000  Psych Admission Type (Psych Patients Only)  Admission Status Involuntary  Psychosocial Assessment  Patient Complaints Anxiety  Eye Contact Fair  Facial Expression Sad  Affect Depressed  Speech Slow  Interaction Attention-seeking  Motor Activity Slow  Appearance/Hygiene In scrubs  Behavior Characteristics Guarded  Mood Depressed  Thought Process  Coherency WDL  Content Blaming self  Delusions None reported or observed  Perception WDL  Hallucination None reported or observed  Judgment Poor  Confusion Mild  Danger to Self  Current suicidal ideation? Passive  Self-Injurious Behavior Obsessive/ruminative self-injurious and active non-lethal gesturing observed or expressed   Agreement Not to Harm Self Yes  Description of Agreement verbal  Danger to Others  Danger to Others None reported or observed

## 2022-09-17 NOTE — Progress Notes (Signed)
Pt denies HI/AVH but endorses SI and verbally agrees to approach staff if these become apparent or before harming themselves/others. Rates depression 8/10. Rates anxiety 8/10. Rates pain 0/10.  Pt took 2 showers today because pt stated that it helps her to calm down. Pt has been in the dayroom for most of the day. Pt is minimal and guarded. Scheduled medications administered to pt, per MD orders. RN provided support and encouragement to pt. Q15 min safety checks implemented and continued. Pt is safe on the unit. Plan of care on going and no other concerns expressed at this time.  09/17/22 0800  Psych Admission Type (Psych Patients Only)  Admission Status Involuntary  Psychosocial Assessment  Patient Complaints Anxiety;Depression;Crying spells  Eye Contact Fair  Facial Expression Sad;Trembling lip  Affect Anxious;Depressed  Speech Slow;Soft  Interaction Needy;Guarded  Motor Activity Slow  Appearance/Hygiene Unremarkable;In scrubs  Behavior Characteristics Cooperative;Guarded  Mood Depressed;Anxious  Aggressive Behavior  Effect No apparent injury  Thought Process  Coherency WDL  Content Blaming self  Delusions None reported or observed  Perception WDL  Hallucination None reported or observed  Judgment Poor  Confusion Mild  Danger to Self  Current suicidal ideation? Passive  Self-Injurious Behavior Some self-injurious ideation observed or expressed.  No lethal plan expressed   Agreement Not to Harm Self Yes  Description of Agreement verbal  Danger to Others  Danger to Others None reported or observed

## 2022-09-17 NOTE — BH IP Treatment Plan (Signed)
Interdisciplinary Treatment and Diagnostic Plan Update  09/17/2022 Time of Session: 08:51 Natalie Rose MRN: [id number]  Principal Diagnosis: Major depressive disorder, recurrent severe without psychotic features  Secondary Diagnoses: Principal Problem:   Major depressive disorder, recurrent severe without psychotic features Active Problems:   PTSD (post-traumatic stress disorder)   Self-inflicted laceration of wrist   Cannabis abuse   Panic attack   OCD (obsessive compulsive disorder)   Current Medications:  Current Facility-Administered Medications  Medication Dose Route Frequency Provider Last Rate Last Admin   acetaminophen (TYLENOL) tablet 650 mg  650 mg Oral Q6H PRN Charm Rings, NP   650 mg at 09/16/22 2000   alum & mag hydroxide-simeth (MAALOX/MYLANTA) 200-200-20 MG/5ML suspension 30 mL  30 mL Oral Q4H PRN Charm Rings, NP       atenolol (TENORMIN) tablet 50 mg  50 mg Oral Daily Clapacs, [name], MD   50 mg at 09/17/22 7829   clonazePAM (KLONOPIN) tablet 0.5 mg  0.5 mg Oral BID Clapacs, [name], MD   0.5 mg at 09/17/22 0800   gabapentin (NEURONTIN) capsule 300 mg  300 mg Oral TID Charm Rings, NP   300 mg at 09/17/22 0800   hydrOXYzine (ATARAX) tablet 50 mg  50 mg Oral TID PRN Charm Rings, NP   50 mg at 09/17/22 0800   LORazepam (ATIVAN) tablet 2 mg  2 mg Oral Q6H PRN Clapacs, [name], MD       magnesium hydroxide (MILK OF MAGNESIA) suspension 30 mL  30 mL Oral Daily PRN Charm Rings, NP       metFORMIN (GLUCOPHAGE) tablet 500 mg  500 mg Oral BID WC Charm Rings, NP   500 mg at 09/17/22 0800   mirtazapine (REMERON) tablet 45 mg  45 mg Oral QHS Clapacs, [name], MD   45 mg at 09/16/22 2110   neomycin-bacitracin-polymyxin (NEOSPORIN) ointment   Topical BID Clapacs, [name], MD   Given at 09/16/22 1728   nicotine (NICODERM CQ - dosed in mg/24 hours) patch 14 mg  14 mg Transdermal Daily Charm Rings, NP       OLANZapine (ZYPREXA) tablet 10 mg  10 mg  Oral BID PRN Charm Rings, NP       Or   OLANZapine (ZYPREXA) injection 10 mg  10 mg Intramuscular BID PRN Charm Rings, NP       OLANZapine (ZYPREXA) tablet 10 mg  10 mg Oral QHS Clapacs, [name], MD   10 mg at 09/16/22 2111   prazosin (MINIPRESS) capsule 4 mg  4 mg Oral QHS Clapacs, [name], MD   4 mg at 09/16/22 2109   promethazine (PHENERGAN) tablet 25 mg  25 mg Oral Q6H PRN Clapacs, [name], MD       propranolol (INDERAL) tablet 10 mg  10 mg Oral TID Clapacs, [name], MD   10 mg at 09/17/22 0829   QUEtiapine (SEROQUEL) tablet 50 mg  50 mg Oral BID Clapacs, [name], MD   50 mg at 09/17/22 0800   sertraline (ZOLOFT) tablet 150 mg  150 mg Oral Daily Charm Rings, NP   150 mg at 09/17/22 0800   traZODone (DESYREL) tablet 100 mg  100 mg Oral QHS Charm Rings, NP   100 mg at 09/16/22 2110   PTA Medications: Medications Prior to Admission  Medication Sig Dispense Refill Last Dose   atenolol (TENORMIN) 50 MG tablet Take 50 mg by  mouth in the morning.   09/14/2022 at 0900   Cholecalciferol (VITAMIN D-1000 MAX ST) 25 MCG (1000 UT) tablet Take 1,000 Units by mouth daily.   09/14/2022   hydrOXYzine (ATARAX) 50 MG tablet Take 1 tablet (50 mg total) by mouth 3 (three) times daily as needed. (Patient taking differently: Take 50-100 mg by mouth 3 (three) times daily as needed for anxiety.) 60 tablet 1 09/14/2022 at 0900   metFORMIN (GLUCOPHAGE) 500 MG tablet Take 1 tablet (500 mg total) by mouth 2 (two) times daily with a meal. 60 tablet 1 09/14/2022   mirtazapine (REMERON) 45 MG tablet Take 1 tablet (45 mg total) by mouth at bedtime. 30 tablet 1 09/13/2022 at 2100   OLANZapine (ZYPREXA) 10 MG tablet Take 1 tablet (10 mg total) by mouth at bedtime. 30 tablet 1 09/13/2022   prazosin (MINIPRESS) 2 MG capsule Take 2 capsules (4 mg total) by mouth at bedtime. 60 capsule 1 09/13/2022   propranolol (INDERAL) 10 MG tablet Take 1 tablet (10 mg total) by mouth 3 (three) times daily. (Patient taking differently:  Take 10 mg by mouth 2 (two) times daily.) 90 tablet 1 09/14/2022 at 0900   QUEtiapine (SEROQUEL) 50 MG tablet Take 1 tablet (50 mg total) by mouth 3 (three) times daily. (Patient taking differently: Take 50 mg by mouth 2 (two) times daily.) 90 tablet 1 09/14/2022   sertraline (ZOLOFT) 50 MG tablet Take 3 tablets (150 mg total) by mouth daily. 90 tablet 1 09/14/2022 at 0900   traZODone (DESYREL) 100 MG tablet Take 1 tablet (100 mg total) by mouth at bedtime. 30 tablet 1 09/13/2022 at 2100   nicotine (NICODERM CQ - DOSED IN MG/24 HOURS) 14 mg/24hr patch Place 1 patch (14 mg total) onto the skin daily. (Patient not taking: Reported on 09/15/2022) 28 patch 1 Not Taking   ondansetron (ZOFRAN ODT) 4 MG disintegrating tablet Take 1 tablet (4 mg total) by mouth every 8 (eight) hours as needed. (Patient not taking: Reported on 09/15/2022) 30 tablet 1 Completed Course   promethazine (PHENERGAN) 25 MG tablet Take 1 tablet (25 mg total) by mouth every 6 (six) hours as needed for nausea or vomiting. (Patient not taking: Reported on 09/15/2022) 30 tablet 1 Completed Course    Patient Stressors: Other: Thoughts and car issues    Patient Strengths: Music therapist for treatment/growth  Supportive family/friends   Treatment Modalities: Medication Management, Group therapy, Case management,  1 to 1 session with clinician, Psychoeducation, Recreational therapy.   Physician Treatment Plan for Primary Diagnosis: Major depressive disorder, recurrent severe without psychotic features Long Term Goal(s): Improvement in symptoms so as ready for discharge   Short Term Goals: Ability to identify triggers associated with substance abuse/mental health issues will improve Ability to verbalize feelings will improve Ability to demonstrate self-control will improve Ability to identify and develop effective coping behaviors will improve Compliance with prescribed medications will improve  Medication Management:  Evaluate patient's response, side effects, and tolerance of medication regimen.  Therapeutic Interventions: 1 to 1 sessions, Unit Group sessions and Medication administration.  Evaluation of Outcomes: Progressing  Physician Treatment Plan for Secondary Diagnosis: Principal Problem:   Major depressive disorder, recurrent severe without psychotic features Active Problems:   PTSD (post-traumatic stress disorder)   Self-inflicted laceration of wrist   Cannabis abuse   Panic attack   OCD (obsessive compulsive disorder)  Long Term Goal(s): Improvement in symptoms so as ready for discharge   Short Term Goals: Ability to  identify triggers associated with substance abuse/mental health issues will improve Ability to verbalize feelings will improve Ability to demonstrate self-control will improve Ability to identify and develop effective coping behaviors will improve Compliance with prescribed medications will improve     Medication Management: Evaluate patient's response, side effects, and tolerance of medication regimen.  Therapeutic Interventions: 1 to 1 sessions, Unit Group sessions and Medication administration.  Evaluation of Outcomes: Progressing   RN Treatment Plan for Primary Diagnosis: Major depressive disorder, recurrent severe without psychotic features Long Term Goal(s): Knowledge of disease and therapeutic regimen to maintain health will improve  Short Term Goals: Ability to remain free from injury will improve, Ability to verbalize frustration and anger appropriately will improve, Ability to demonstrate self-control, Ability to participate in decision making will improve, Ability to verbalize feelings will improve, Ability to disclose and discuss suicidal ideas, Ability to identify and develop effective coping behaviors will improve, and Compliance with prescribed medications will improve  Medication Management: RN will administer medications as ordered by provider, will assess  and evaluate patient's response and provide education to patient for prescribed medication. RN will report any adverse and/or side effects to prescribing provider.  Therapeutic Interventions: 1 on 1 counseling sessions, Psychoeducation, Medication administration, Evaluate responses to treatment, Monitor vital signs and CBGs as ordered, Perform/monitor CIWA, COWS, AIMS and Fall Risk screenings as ordered, Perform wound care treatments as ordered.  Evaluation of Outcomes: Progressing   LCSW Treatment Plan for Primary Diagnosis: Major depressive disorder, recurrent severe without psychotic features Long Term Goal(s): Safe transition to appropriate next level of care at discharge, Engage patient in therapeutic group addressing interpersonal concerns.  Short Term Goals: Engage patient in aftercare planning with referrals and resources, Increase social support, Increase ability to appropriately verbalize feelings, Increase emotional regulation, Facilitate acceptance of mental health diagnosis and concerns, Facilitate patient progression through stages of change regarding substance use diagnoses and concerns, Identify triggers associated with mental health/substance abuse issues, and Increase skills for wellness and recovery  Therapeutic Interventions: Assess for all discharge needs, 1 to 1 time with Social worker, Explore available resources and support systems, Assess for adequacy in community support network, Educate family and significant other(s) on suicide prevention, Complete Psychosocial Assessment, Interpersonal group therapy.  Evaluation of Outcomes: Progressing   Progress in Treatment: Attending groups: Yes. and No. Participating in groups: Yes. Taking medication as prescribed: Yes. Toleration medication: Yes. Family/Significant other contact made: No, will contact:  mother, Nicholos Johns. Patient understands diagnosis: Yes. Discussing patient identified problems/goals with staff:  Yes. Medical problems stabilized or resolved: Yes. Denies suicidal/homicidal ideation: Yes. Issues/concerns per patient self-inventory: No. Other: none.  New problem(s) identified: No, Describe:  none identified.  New Short Term/Long Term Goal(s): detox, elimination of symptoms of psychosis, medication management for mood stabilization; elimination of SI thoughts; development of comprehensive mental wellness/sobriety plan.  Patient Goals:  "Getting these thoughts under control and when I get anxious not wanting to hurt myself."  Discharge Plan or Barriers: CSW will assist pt with development of an appropriate aftercare/discharge plan.   Reason for Continuation of Hospitalization: Anxiety Medication stabilization Other; describe self-injurious behavior  Estimated Length of Stay: 1-7 days  Last 3 [place] Suicide Severity Risk Score: Flowsheet Row Admission (Current) from 09/15/2022 in [facility] ED from 09/14/2022 in [facility] Emergency Department at [facility] ED from 09/13/2022 in [facility] Emergency Department at [facility]  C-SSRS RISK CATEGORY High Risk High Risk Error: Q3, 4, or 5 should not  be populated when Q2 is No       Last PHQ 2/9 Scores:     No data to display          Scribe for Treatment Team: Glenis Smoker, LCSW 09/17/2022 9:09 AM

## 2022-09-17 NOTE — Plan of Care (Signed)
  Problem: Education: Goal: Knowledge of General Education information will improve Description: Including pain rating scale, medication(s)/side effects and non-pharmacologic comfort measures Outcome: Progressing   Problem: Activity: Goal: Risk for activity intolerance will decrease Outcome: Progressing   Problem: Nutrition: Goal: Adequate nutrition will be maintained Outcome: Progressing   Problem: Safety: Goal: Ability to remain free from injury will improve Outcome: Progressing   Problem: Education: Goal: Knowledge of the prescribed therapeutic regimen will improve Outcome: Progressing   Problem: Coping: Goal: Level of anxiety will decrease Outcome: Not Progressing   Problem: Education: Goal: Ability to make informed decisions regarding treatment will improve Outcome: Not Progressing   Problem: Coping: Goal: Coping ability will improve Outcome: Not Progressing

## 2022-09-17 NOTE — Progress Notes (Signed)
   09/17/22 2000  Psych Admission Type (Psych Patients Only)  Admission Status Involuntary  Psychosocial Assessment  Patient Complaints Anxiety  Eye Contact Fair  Facial Expression Sad  Affect Anxious  Speech Logical/coherent  Interaction Guarded  Motor Activity Slow  Appearance/Hygiene Unremarkable  Behavior Characteristics Cooperative;Guarded  Mood Sad;Anxious  Thought Process  Coherency WDL  Content WDL  Delusions None reported or observed  Perception WDL  Hallucination None reported or observed  Judgment Poor  Confusion None  Danger to Self  Current suicidal ideation? Denies  Danger to Others  Danger to Others None reported or observed   Progress note   D: Pt seen in hallway having VS assessed. Pt denies SI, HI, AVH. Pt rates pain  6/10 in left arm where she scratched herself. Area is clean and dry, but sore. Provider discontinued ointment this morning. Pt given bandage to cover area. Refused pain medication. Pt rates anxiety  4/10 and depression  0/10. No other concerns noted at this time.  A: Pt provided support and encouragement. Pt given scheduled medication as prescribed. PRNs as appropriate. Q15 min checks for safety.   R: Pt safe on the unit. Will continue to monitor.

## 2022-09-17 NOTE — Group Note (Signed)
[facility] LCSW Group Therapy Note    Group Date: 09/17/2022 Start Time: 1330 End Time: 1415  Type of Therapy and Topic:  Group Therapy:  Overcoming Obstacles  Participation Level:  [facility] PARTICIPATION LEVEL: Minimal   Description of Group:   In this group patients will be encouraged to explore what they see as obstacles to their own wellness and recovery. They will be guided to discuss their thoughts, feelings, and behaviors related to these obstacles. The group will process together ways to cope with barriers, with attention given to specific choices patients can make. Each patient will be challenged to identify changes they are motivated to make in order to overcome their obstacles. This group will be process-oriented, with patients participating in exploration of their own experiences as well as giving and receiving support and challenge from other group members.  Therapeutic Goals: 1. Patient will identify personal and current obstacles as they relate to admission. 2. Patient will identify barriers that currently interfere with their wellness or overcoming obstacles.  3. Patient will identify feelings, thought process and behaviors related to these barriers. 4. Patient will identify two changes they are willing to make to overcome these obstacles:    Summary of Patient Progress Patient was present for the entirety of the group process. She identified the future death of her mother as an obstacle that she would like to overcome. Outside of this pt did not participate in the discussion further. However, she appeared open and receptive to feedback/comments from both peers and facilitator.    Therapeutic Modalities:   Cognitive Behavioral Therapy Solution Focused Therapy Motivational Interviewing Relapse Prevention Therapy   Glenis Smoker, LCSW

## 2022-09-17 NOTE — Progress Notes (Signed)
[facility] MD Progress Note  09/17/2022 10:37 AM Natalie Rose  MRN:  [id number] Subjective: Patient seen.  Patient also attended treatment team.  Patient continues to be very anxious and says that she still has hallucinations with passive suicidal thoughts but denies any intention or plan of acting on it.  Outwardly she appears much calmer she is able to hold a lucid conversation.  Slept a little better.  No side effects of medicine. Principal Problem: Major depressive disorder, recurrent severe without psychotic features Diagnosis: Principal Problem:   Major depressive disorder, recurrent severe without psychotic features Active Problems:   PTSD (post-traumatic stress disorder)   Self-inflicted laceration of wrist   Cannabis abuse   Panic attack   OCD (obsessive compulsive disorder)  Total Time spent with patient: 30 minutes  Past Psychiatric History: Past history of chronic anxiety and mood symptoms  Past Medical History:  Past Medical History:  Diagnosis Date   Anxiety    Arthritis    Depression    Diabetes mellitus without complication    Mental health disorder    Migraines     Past Surgical History:  Procedure Laterality Date   MOUTH SURGERY     Family History:  Family History  Problem Relation Age of Onset   Osteopenia Mother    Heart failure Mother    Osteoarthritis Mother    Depression Mother    Diabetes Father    Family Psychiatric  History: See previous Social History:  Social History   Substance and Sexual Activity  Alcohol Use Not Currently     Social History   Substance and Sexual Activity  Drug Use Yes   Frequency: 3.0 times per week   Types: Marijuana    Social History   Socioeconomic History   Marital status: Single    Spouse name: Not on file   Number of children: Not on file   Years of education: Not on file   Highest education level: Not on file  Occupational History   Not on file  Tobacco Use   Smoking status: Every Day     Packs/day: 1    Types: Cigarettes   Smokeless tobacco: Never  Vaping Use   Vaping Use: Never used  Substance and Sexual Activity   Alcohol use: Not Currently   Drug use: Yes    Frequency: 3.0 times per week    Types: Marijuana   Sexual activity: Not Currently    Birth control/protection: None  Other Topics Concern   Not on file  Social History Narrative   Not on file   Social Determinants of Health   Financial Resource Strain: Not on file  Food Insecurity: No Food Insecurity (09/15/2022)   Hunger Vital Sign    Worried About Running Out of Food in the Last Year: Never true    Ran Out of Food in the Last Year: Never true  Recent Concern: Food Insecurity - Food Insecurity Present (06/24/2022)   Hunger Vital Sign    Worried About Running Out of Food in the Last Year: Sometimes true    Ran Out of Food in the Last Year: Often true  Transportation Needs: No Transportation Needs (09/15/2022)   PRAPARE - Administrator, Civil Service (Medical): No    Lack of Transportation (Non-Medical): No  Recent Concern: Transportation Needs - Unmet Transportation Needs (06/24/2022)   PRAPARE - Administrator, Civil Service (Medical): Yes    Lack of Transportation (Non-Medical): Yes  Physical Activity:  Not on file  Stress: Not on file  Social Connections: Not on file   Additional Social History:                         Sleep: Fair  Appetite:  Fair  Current Medications: Current Facility-Administered Medications  Medication Dose Route Frequency Provider Last Rate Last Admin   acetaminophen (TYLENOL) tablet 650 mg  650 mg Oral Q6H PRN Charm Rings, NP   650 mg at 09/16/22 2000   alum & mag hydroxide-simeth (MAALOX/MYLANTA) 200-200-20 MG/5ML suspension 30 mL  30 mL Oral Q4H PRN Charm Rings, NP       atenolol (TENORMIN) tablet 50 mg  50 mg Oral Daily Arielle Eber, [name], MD   50 mg at 09/17/22 8115   clonazePAM (KLONOPIN) tablet 0.5 mg  0.5 mg Oral BID Deven Furia,  Anajah Sterbenz T, MD   0.5 mg at 09/17/22 0800   gabapentin (NEURONTIN) capsule 300 mg  300 mg Oral TID Charm Rings, NP   300 mg at 09/17/22 0800   hydrOXYzine (ATARAX) tablet 50 mg  50 mg Oral TID PRN Charm Rings, NP   50 mg at 09/17/22 0800   LORazepam (ATIVAN) tablet 2 mg  2 mg Oral Q6H PRN Marisue Canion, [name], MD       magnesium hydroxide (MILK OF MAGNESIA) suspension 30 mL  30 mL Oral Daily PRN Charm Rings, NP       metFORMIN (GLUCOPHAGE) tablet 500 mg  500 mg Oral BID WC Charm Rings, NP   500 mg at 09/17/22 0800   mirtazapine (REMERON) tablet 45 mg  45 mg Oral QHS Amarachukwu Lakatos T, MD   45 mg at 09/16/22 2110   nicotine (NICODERM CQ - dosed in mg/24 hours) patch 14 mg  14 mg Transdermal Daily Charm Rings, NP       OLANZapine (ZYPREXA) tablet 10 mg  10 mg Oral BID PRN Charm Rings, NP       Or   OLANZapine (ZYPREXA) injection 10 mg  10 mg Intramuscular BID PRN Charm Rings, NP       OLANZapine (ZYPREXA) tablet 10 mg  10 mg Oral QHS Vayden Weinand, [name], MD   10 mg at 09/16/22 2111   prazosin (MINIPRESS) capsule 4 mg  4 mg Oral QHS Marcee Jacobs T, MD   4 mg at 09/16/22 2109   promethazine (PHENERGAN) tablet 25 mg  25 mg Oral Q6H PRN Tariya Morrissette, [name], MD       propranolol (INDERAL) tablet 10 mg  10 mg Oral TID Kiyana Vazguez, [name], MD   10 mg at 09/17/22 0829   QUEtiapine (SEROQUEL) tablet 50 mg  50 mg Oral BID Pierce Biagini T, MD   50 mg at 09/17/22 0800   sertraline (ZOLOFT) tablet 150 mg  150 mg Oral Daily Charm Rings, NP   150 mg at 09/17/22 0800   traZODone (DESYREL) tablet 100 mg  100 mg Oral QHS Charm Rings, NP   100 mg at 09/16/22 2110    Lab Results: No results found for this or any previous visit (from the past 48 hour(s)).  Blood Alcohol level:  Lab Results  Component Value Date   [facility] <10 09/14/2022   ETH <10 09/13/2022    Metabolic Disorder Labs: Lab Results  Component Value Date   HGBA1C 5.7 (H) 06/29/2022   MPG 116.89 06/29/2022   MPG 162.81 12/16/2020  No results found for: "PROLACTIN" Lab Results  Component Value Date   CHOL 215 (H) 06/29/2022   TRIG 212 (H) 06/29/2022   HDL 29 (L) 06/29/2022   CHOLHDL 7.4 06/29/2022   VLDL 42 (H) 06/29/2022   LDLCALC 144 (H) 06/29/2022   LDLCALC 132 (H) 12/16/2020    Physical Findings: AIMS: Facial and Oral Movements Muscles of Facial Expression: None, normal Lips and Perioral Area: None, normal Jaw: None, normal Tongue: None, normal,Extremity Movements Upper (arms, wrists, hands, fingers): None, normal Lower (legs, knees, ankles, toes): None, normal, Trunk Movements Neck, shoulders, hips: None, normal, Overall Severity Severity of abnormal movements (highest score from questions above): None, normal Incapacitation due to abnormal movements: None, normal Patient's awareness of abnormal movements (rate only patient's report): No Awareness, Dental Status Current problems with teeth and/or dentures?: No Does patient usually wear dentures?: No  CIWA:    COWS:     Musculoskeletal: Strength & Muscle Tone: within normal limits Gait & Station: normal Patient leans: N/A  Psychiatric Specialty Exam:  Presentation  General Appearance:  Casual  Eye Contact: Good  Speech: Clear and Coherent  Speech Volume: Normal  Handedness:No data recorded  Mood and Affect  Mood: Anxious  Affect: Congruent   Thought Process  Thought Processes: Coherent  Descriptions of Associations:Intact  Orientation:Full (Time, Place and Person)  Thought Content:WDL  History of Schizophrenia/Schizoaffective disorder:No  Duration of Psychotic Symptoms:No data recorded Hallucinations:No data recorded Ideas of Reference:None  Suicidal Thoughts:No data recorded Homicidal Thoughts:No data recorded  Sensorium  Memory: Immediate Good  Judgment: Fair  Insight: Fair   Art therapist  Concentration: Good  Attention Span: Good  Recall: Good  Fund of  Knowledge: Good  Language: Good   Psychomotor Activity  Psychomotor Activity:No data recorded  Assets  Assets: Desire for Improvement   Sleep  Sleep:No data recorded   Physical Exam: Physical Exam Vitals and nursing note reviewed.  Constitutional:      Appearance: Normal appearance.  HENT:     Head: Normocephalic and atraumatic.     Mouth/Throat:     Pharynx: Oropharynx is clear.  Eyes:     Pupils: Pupils are equal, round, and reactive to light.  Cardiovascular:     Rate and Rhythm: Normal rate and regular rhythm.  Pulmonary:     Effort: Pulmonary effort is normal.     Breath sounds: Normal breath sounds.  Abdominal:     General: Abdomen is flat.     Palpations: Abdomen is soft.  Musculoskeletal:        General: Normal range of motion.  Skin:    General: Skin is warm and dry.  Neurological:     General: No focal deficit present.     Mental Status: She is alert. Mental status is at baseline.  Psychiatric:        Attention and Perception: Attention normal.        Mood and Affect: Mood normal. Affect is blunt.        Speech: Speech is delayed.        Behavior: Behavior is slowed.        Thought Content: Thought content includes suicidal ideation.        Cognition and Memory: Cognition normal.    Review of Systems  Constitutional: Negative.   HENT: Negative.    Eyes: Negative.   Respiratory: Negative.    Cardiovascular: Negative.   Gastrointestinal: Negative.   Musculoskeletal: Negative.   Skin: Negative.   Neurological: Negative.  Psychiatric/Behavioral:  Positive for depression, hallucinations and suicidal ideas. The patient is nervous/anxious.    Blood pressure 100/67, pulse 85, temperature 98 F (36.7 C), temperature source Oral, resp. rate 20, height  (1.626 m), weight 75.1 kg, SpO2 96 %. Body mass index is 28.41 kg/m.   Treatment Plan Summary: No change to medicine for today.  Ongoing group attendance ongoing medication and supportive  counseling encourage patient to speak with full treatment team.  Mordecai Rasmussen, MD 09/17/2022, 10:37 AM

## 2022-09-17 NOTE — Group Note (Signed)
Date:  09/17/2022 Time:  6:28 PM  Group Topic/Focus:  Activity Group    Participation Level:  Active  Participation Quality:  Appropriate  Affect:  Appropriate  Cognitive:  Appropriate  Insight: Appropriate  Engagement in Group:  Engaged  Modes of Intervention:  Activity  Additional Comments:    Tanay Massiah Travis Zykera Abella 09/17/2022, 6:28 PM  

## 2022-09-17 NOTE — Group Note (Signed)
Date:  09/17/2022 Time:  10:06 PM  Group Topic/Focus:  Wrap-Up Group:   The focus of this group is to help patients review their daily goal of treatment and discuss progress on daily workbooks.    Participation Level:  Minimal  Participation Quality:  Appropriate  Affect:  Flat  Cognitive:  Alert and Appropriate  Insight: Appropriate  Engagement in Group:  Limited and None  Modes of Intervention:  Limit-setting  Additional Comments:     Maglione,Ceonna Frazzini E 09/17/2022, 10:06 PM

## 2022-09-17 NOTE — Group Note (Signed)
Recreation Therapy Group Note   Group Topic:Problem Solving  Group Date: 09/17/2022 Start Time: 1000 End Time: 1055 Facilitators: Rosina Lowenstein, LRT, CTRS Location:  Craft Room  Group Description: Life Boat. Patients were given the scenario that they are on a boat that is about to become shipwrecked, leaving them stranded on an [place]. They are asked to make a list of 15 different items that they want to take with them when they are stranded on the [place]. Patients are asked to rank their items from most important to least important, #1 being the most important and #15 being the least. Patients will work individually for the first round to come up with 15 items and then pair up with a peer(s) to condense their list and come up with one list of 15 items between the two of them. Patients or LRT will read aloud the 15 different items to the group after each round. LRT facilitated post-activity processing to discuss how this activity can be used in daily life post discharge.   Goal Area(s) Addressed:  Patient will identify priorities, wants and needs. Patient will communicate with LRT and peers. Patient will work collectively as a Administrator, Civil Service. Patient will work on Product manager.    Affect/Mood: N/A   Participation Level: Did not attend    Clinical Observations/Individualized Feedback: Dedee did not attend group due to resting in her room.  Plan: Continue to engage patient in RT group sessions 2-3x/week.   Rosina Lowenstein, LRT, CTRS 09/17/2022 11:09 AM

## 2022-09-18 DIAGNOSIS — F332 Major depressive disorder, recurrent severe without psychotic features: Secondary | ICD-10-CM | POA: Diagnosis not present

## 2022-09-18 MED ORDER — BACITRACIN-NEOMYCIN-POLYMYXIN OINTMENT TUBE
TOPICAL_OINTMENT | Freq: Two times a day (BID) | CUTANEOUS | Status: DC
  Administered 2022-09-18: 1 via TOPICAL
  Filled 2022-09-18: qty 14.17

## 2022-09-18 NOTE — Group Note (Signed)
Recreation Therapy Group Note   Group Topic:Healthy Support Systems  Group Date: 09/18/2022 Start Time: 1010 End Time: 1100 Facilitators: Rosina Lowenstein, LRT, CTRS Location:  Craft Room  Group Description: Straw Bridge. Patients were given 10 plastic drinking straws and an equal length of masking tape. Using the materials provided, patients were instructed to build a free-standing bridge-like structure to suspend an everyday item (ex: deck of cards) off the floor or table surface. All materials were required to be used in Secondary school teacher. LRT facilitated post-activity discussion reviewing the importance of having strong and healthy support systems in our lives. LRT discussed how the people in our lives serve as the tape and the book we placed on top of our straw structure are the stressors we face in daily life. LRT and pts discussed what happens in our life when things get too heavy for [place], and we don't have strong supports outside of the hospital. Pt shared 2 of their healthy supports aloud in the group.   Goal Area(s) Addressed:  Patient will identify 2 healthy supports in their life. Patient will identify skills to successfully complete activity. Patient will identify correlation of this activity to life post-discharge.    Affect/Mood: Blunted and Restricted   Participation Level: Minimal   Participation Quality: Minimal Cues   Behavior: Disinterested and Hesitant   Speech/Thought Process: Coherent   Insight: Limited   Judgement: Limited   Modes of Intervention: Activity   Patient Response to Interventions:  Disengaged   Education Outcome:  In group clarification offered    Clinical Observations/Individualized Feedback: Natalie Rose was minimally active in their participation of session activities and group discussion. Pt identified "mom and my friend, Bethann Berkshire" as her healthy supports in her life when directly prompted by LRT. Pt did not make a structure with given supplies, she  taped a few straws together and sat there still the remaining time in group. Pt was calm and quiet.    Plan: Continue to engage patient in RT group sessions 2-3x/week.   Rosina Lowenstein, LRT, CTRS 09/18/2022 11:28 AM

## 2022-09-18 NOTE — Progress Notes (Signed)
Pt denies SI/HI/AVH and verbally agrees to approach staff if these become apparent or before harming themselves/others. Rates depression 3/10. Rates anxiety 7/10. Rates pain 0/10. Pt has been out of her room for most of the day. Pt has smiled a few times. Pt scratches cleaned and neosporin applied with new dressing. Scheduled medications administered to pt, per MD orders. RN provided support and encouragement to pt. Q15 min safety checks implemented and continued. Pt is safe on the unit. Plan of care on going and no other concerns expressed at this time.  09/18/22 0817  Psych Admission Type (Psych Patients Only)  Admission Status Involuntary  Psychosocial Assessment  Patient Complaints Anxiety;Depression  Eye Contact Fair  Facial Expression Sad;Flat  Affect Anxious;Depressed;Sad;Flat  Speech Logical/coherent  Interaction Guarded  Motor Activity Slow  Appearance/Hygiene Unremarkable  Behavior Characteristics Cooperative;Appropriate to situation;Anxious  Mood Anxious;Depressed;Sad  Aggressive Behavior  Effect No apparent injury  Thought Process  Coherency WDL  Content WDL  Delusions None reported or observed  Perception WDL  Hallucination None reported or observed  Judgment Limited  Confusion None  Danger to Self  Current suicidal ideation? Denies  Danger to Others  Danger to Others None reported or observed

## 2022-09-18 NOTE — Progress Notes (Signed)
[facility] MD Progress Note  09/18/2022 12:36 PM Natalie Rose  MRN:  [id number] Subjective: Follow-up for this 42 year old woman with depression and anxiety.  Still feeling anxious today but better.  Not as panicky.  Able to interact more appropriately.  No new physical complaints. Principal Problem: Major depressive disorder, recurrent severe without psychotic features Diagnosis: Principal Problem:   Major depressive disorder, recurrent severe without psychotic features Active Problems:   PTSD (post-traumatic stress disorder)   Self-inflicted laceration of wrist   Cannabis abuse   Panic attack   OCD (obsessive compulsive disorder)  Total Time spent with patient: 30 minutes  Past Psychiatric History: Past history of anxiety and depression  Past Medical History:  Past Medical History:  Diagnosis Date   Anxiety    Arthritis    Depression    Diabetes mellitus without complication    Mental health disorder    Migraines     Past Surgical History:  Procedure Laterality Date   MOUTH SURGERY     Family History:  Family History  Problem Relation Age of Onset   Osteopenia Mother    Heart failure Mother    Osteoarthritis Mother    Depression Mother    Diabetes Father    Family Psychiatric  History: See previous Social History:  Social History   Substance and Sexual Activity  Alcohol Use Not Currently     Social History   Substance and Sexual Activity  Drug Use Yes   Frequency: 3.0 times per week   Types: Marijuana    Social History   Socioeconomic History   Marital status: Single    Spouse name: Not on file   Number of children: Not on file   Years of education: Not on file   Highest education level: Not on file  Occupational History   Not on file  Tobacco Use   Smoking status: Every Day    Packs/day: 1    Types: Cigarettes   Smokeless tobacco: Never  Vaping Use   Vaping Use: Never used  Substance and Sexual Activity   Alcohol use: Not Currently    Drug use: Yes    Frequency: 3.0 times per week    Types: Marijuana   Sexual activity: Not Currently    Birth control/protection: None  Other Topics Concern   Not on file  Social History Narrative   Not on file   Social Determinants of Health   Financial Resource Strain: Not on file  Food Insecurity: No Food Insecurity (09/15/2022)   Hunger Vital Sign    Worried About Running Out of Food in the Last Year: Never true    Ran Out of Food in the Last Year: Never true  Recent Concern: Food Insecurity - Food Insecurity Present (06/24/2022)   Hunger Vital Sign    Worried About Running Out of Food in the Last Year: Sometimes true    Ran Out of Food in the Last Year: Often true  Transportation Needs: No Transportation Needs (09/15/2022)   PRAPARE - Administrator, Civil Service (Medical): No    Lack of Transportation (Non-Medical): No  Recent Concern: Transportation Needs - Unmet Transportation Needs (06/24/2022)   PRAPARE - Administrator, Civil Service (Medical): Yes    Lack of Transportation (Non-Medical): Yes  Physical Activity: Not on file  Stress: Not on file  Social Connections: Not on file   Additional Social History:  Sleep: Fair  Appetite:  Fair  Current Medications: Current Facility-Administered Medications  Medication Dose Route Frequency Provider Last Rate Last Admin   acetaminophen (TYLENOL) tablet 650 mg  650 mg Oral Q6H PRN Charm Rings, NP   650 mg at 09/18/22 1122   alum & mag hydroxide-simeth (MAALOX/MYLANTA) 200-200-20 MG/5ML suspension 30 mL  30 mL Oral Q4H PRN Charm Rings, NP       atenolol (TENORMIN) tablet 50 mg  50 mg Oral Daily Norberta Stobaugh, [name], MD   50 mg at 09/18/22 1610   clonazePAM (KLONOPIN) tablet 0.5 mg  0.5 mg Oral BID Yesika Rispoli, [name], MD   0.5 mg at 09/18/22 9604   gabapentin (NEURONTIN) capsule 300 mg  300 mg Oral TID Charm Rings, NP   300 mg at 09/18/22 1119   hydrOXYzine (ATARAX)  tablet 50 mg  50 mg Oral TID PRN Charm Rings, NP   50 mg at 09/17/22 2110   magnesium hydroxide (MILK OF MAGNESIA) suspension 30 mL  30 mL Oral Daily PRN Charm Rings, NP       metFORMIN (GLUCOPHAGE) tablet 500 mg  500 mg Oral BID WC Charm Rings, NP   500 mg at 09/18/22 5409   mirtazapine (REMERON) tablet 45 mg  45 mg Oral QHS Fritzi Scripter, [name], MD   45 mg at 09/17/22 2110   neomycin-bacitracin-polymyxin (NEOSPORIN) ointment   Topical BID Remy Voiles, [name], MD       nicotine (NICODERM CQ - dosed in mg/24 hours) patch 14 mg  14 mg Transdermal Daily Charm Rings, NP       OLANZapine (ZYPREXA) tablet 10 mg  10 mg Oral BID PRN Charm Rings, NP       Or   OLANZapine (ZYPREXA) injection 10 mg  10 mg Intramuscular BID PRN Charm Rings, NP       OLANZapine (ZYPREXA) tablet 10 mg  10 mg Oral QHS Nakia Koble, [name], MD   10 mg at 09/17/22 2110   prazosin (MINIPRESS) capsule 4 mg  4 mg Oral QHS Mackenzie Groom, [name], MD   4 mg at 09/17/22 2110   promethazine (PHENERGAN) tablet 25 mg  25 mg Oral Q6H PRN Janeice Stegall, [name], MD       propranolol (INDERAL) tablet 10 mg  10 mg Oral TID Sonu Kruckenberg, [name], MD   10 mg at 09/18/22 0806   QUEtiapine (SEROQUEL) tablet 50 mg  50 mg Oral BID Taler Kushner, [name], MD   50 mg at 09/18/22 0806   sertraline (ZOLOFT) tablet 150 mg  150 mg Oral Daily Charm Rings, NP   150 mg at 09/18/22 8119   traZODone (DESYREL) tablet 100 mg  100 mg Oral QHS Charm Rings, NP   100 mg at 09/17/22 2110    Lab Results:  Results for orders placed or performed during the hospital encounter of 09/15/22 (from the past 48 hour(s))  Lipid panel     Status: Abnormal   Collection Time: 09/17/22  9:45 AM  Result Value Ref Range   Cholesterol 201 (H) 0 - 200 mg/dL   Triglycerides 147 <829 mg/dL   HDL 35 (L) >56 mg/dL   Total CHOL/HDL Ratio 5.7 RATIO   VLDL 20 0 - 40 mg/dL   LDL Cholesterol 213 (H) 0 - 99 mg/dL    Comment:        Total Cholesterol/HDL:CHD Risk Coronary Heart Disease Risk  Table  Men   Women  1/2 Average Risk   3.4   3.3  Average Risk       5.0   4.4  2 X Average Risk   9.6   7.1  3 X Average Risk  23.4   11.0        Use the calculated Patient Ratio above and the CHD Risk Table to determine the patient's CHD Risk.        ATP III CLASSIFICATION (LDL):  <100     mg/dL   Optimal  161-096  mg/dL   Near or Above                    Optimal  130-159  mg/dL   Borderline  045-409  mg/dL   High  >811     mg/dL   Very High Performed at [facility], [address] [place], [place] [address]   Hemoglobin A1c     Status: Abnormal   Collection Time: 09/17/22  9:45 AM  Result Value Ref Range   Hgb A1c MFr Bld 6.2 (H) 4.8 - 5.6 %    Comment: (NOTE) Pre diabetes:          5.7%-6.4%  Diabetes:              >6.4%  Glycemic control for   <7.0% adults with diabetes    Mean Plasma Glucose 131.24 mg/dL    Comment: Performed at [facility], [address], [place], [place] [address]    Blood Alcohol level:  Lab Results  Component Value Date   [facility] <10 09/14/2022   ETH <10 09/13/2022    Metabolic Disorder Labs: Lab Results  Component Value Date   HGBA1C 6.2 (H) 09/17/2022   MPG 131.24 09/17/2022   MPG 116.89 06/29/2022   No results found for: "PROLACTIN" Lab Results  Component Value Date   CHOL 201 (H) 09/17/2022   TRIG 100 09/17/2022   HDL 35 (L) 09/17/2022   CHOLHDL 5.7 09/17/2022   VLDL 20 09/17/2022   LDLCALC 146 (H) 09/17/2022   LDLCALC 144 (H) 06/29/2022    Physical Findings: AIMS: Facial and Oral Movements Muscles of Facial Expression: None, normal Lips and Perioral Area: None, normal Jaw: None, normal Tongue: None, normal,Extremity Movements Upper (arms, wrists, hands, fingers): None, normal Lower (legs, knees, ankles, toes): None, normal, Trunk Movements Neck, shoulders, hips: None, normal, Overall Severity Severity of abnormal movements (highest score from questions above): None,  normal Incapacitation due to abnormal movements: None, normal Patient's awareness of abnormal movements (rate only patient's report): No Awareness, Dental Status Current problems with teeth and/or dentures?: No Does patient usually wear dentures?: No  CIWA:    COWS:     Musculoskeletal: Strength & Muscle Tone: within normal limits Gait & Station: normal Patient leans: N/A  Psychiatric Specialty Exam:  Presentation  General Appearance:  Casual  Eye Contact: Good  Speech: Clear and Coherent  Speech Volume: Normal  Handedness:No data recorded  Mood and Affect  Mood: Anxious  Affect: Congruent   Thought Process  Thought Processes: Coherent  Descriptions of Associations:Intact  Orientation:Full (Time, Place and Person)  Thought Content:WDL  History of Schizophrenia/Schizoaffective disorder:No  Duration of Psychotic Symptoms:No data recorded Hallucinations:No data recorded Ideas of Reference:None  Suicidal Thoughts:No data recorded Homicidal Thoughts:No data recorded  Sensorium  Memory: Immediate Good  Judgment: Fair  Insight: Fair   Executive Functions  Concentration: Good  Attention Span: Good  Recall: Good  Fund of Knowledge: Good  Language: Good   Psychomotor Activity  Psychomotor Activity:No data recorded  Assets  Assets: Desire for Improvement   Sleep  Sleep:No data recorded   Physical Exam: Physical Exam Vitals and nursing note reviewed.  Constitutional:      Appearance: Normal appearance.  HENT:     Head: Normocephalic and atraumatic.     Mouth/Throat:     Pharynx: Oropharynx is clear.  Eyes:     Pupils: Pupils are equal, round, and reactive to light.  Cardiovascular:     Rate and Rhythm: Normal rate and regular rhythm.  Pulmonary:     Effort: Pulmonary effort is normal.     Breath sounds: Normal breath sounds.  Abdominal:     General: Abdomen is flat.     Palpations: Abdomen is soft.   Musculoskeletal:        General: Normal range of motion.  Skin:    General: Skin is warm and dry.  Neurological:     General: No focal deficit present.     Mental Status: She is alert. Mental status is at baseline.  Psychiatric:        Attention and Perception: Attention normal.        Mood and Affect: Mood normal. Affect is blunt.        Speech: Speech is delayed.        Behavior: Behavior is slowed.        Thought Content: Thought content normal.        Cognition and Memory: Memory is impaired.    Review of Systems  Constitutional: Negative.   HENT: Negative.    Eyes: Negative.   Respiratory: Negative.    Cardiovascular: Negative.   Gastrointestinal: Negative.   Musculoskeletal: Negative.   Skin: Negative.   Neurological: Negative.   Psychiatric/Behavioral:  The patient is nervous/anxious.    Blood pressure 101/67, pulse (!) 59, temperature 98.1 F (36.7 C), temperature source Oral, resp. rate (!) 21, height  (1.626 m), weight 75.1 kg, SpO2 95 %. Body mass index is 28.41 kg/m.   Treatment Plan Summary: Plan no change to medication.  Supportive therapy and counseling.  Encourage group attendance.  Hope that we might be moving towards discharge within a couple days.  Mordecai Rasmussen, MD 09/18/2022, 12:36 PM

## 2022-09-18 NOTE — Plan of Care (Signed)
  Problem: Education: Goal: Knowledge of General Education information will improve Description: Including pain rating scale, medication(s)/side effects and non-pharmacologic comfort measures Outcome: Progressing   Problem: Activity: Goal: Risk for activity intolerance will decrease Outcome: Progressing   Problem: Nutrition: Goal: Adequate nutrition will be maintained Outcome: Progressing   Problem: Coping: Goal: Level of anxiety will decrease Outcome: Progressing   Problem: Activity: Goal: Interest or engagement in leisure activities will improve Outcome: Progressing

## 2022-09-18 NOTE — Group Note (Signed)
Date:  09/18/2022 Time:  6:47 PM  Group Topic/Focus:  Goals Group:   The focus of this group is to help patients establish daily goals to achieve during treatment and discuss how the patient can incorporate goal setting into their daily lives to aide in recovery.  Community Group    Participation Level:  Active  Participation Quality:  Appropriate  Affect:  Appropriate  Cognitive:  Alert and Appropriate  Insight: Appropriate and Good  Engagement in Group:  Engaged  Modes of Intervention:  Activity and Discussion  Additional Comments:    Doug Sou 09/18/2022, 6:47 PM

## 2022-09-18 NOTE — Group Note (Signed)
[facility] LCSW Group Therapy Note   Group Date: 09/18/2022 Start Time: 1315 End Time: 1420   Type of Therapy/Topic:  Group Therapy:  Emotion Regulation  Participation Level:  Did Not Attend    Description of Group:    The purpose of this group is to assist patients in learning to regulate negative emotions and experience positive emotions. Patients will be guided to discuss ways in which they have been vulnerable to their negative emotions. These vulnerabilities will be juxtaposed with experiences of positive emotions or situations, and patients challenged to use positive emotions to combat negative ones. Special emphasis will be placed on coping with negative emotions in conflict situations, and patients will process healthy conflict resolution skills.  Therapeutic Goals: Patient will identify two positive emotions or experiences to reflect on in order to balance out negative emotions:  Patient will label two or more emotions that they find the most difficult to experience:  Patient will be able to demonstrate positive conflict resolution skills through discussion or role plays:   Summary of Patient Progress: X   Therapeutic Modalities:   Cognitive Behavioral Therapy Feelings Identification Dialectical Behavioral Therapy   Sophi Calligan R Kabeer Hoagland, LCSW 

## 2022-09-18 NOTE — Group Note (Signed)
Date:  09/18/2022 Time:  9:52 PM  Group Topic/Focus:  Wrap-Up Group:   The focus of this group is to help patients review their daily goal of treatment and discuss progress on daily workbooks.    Participation Level:  Minimal  Participation Quality:  Appropriate  Affect:  Appropriate  Cognitive:  Alert and Appropriate  Insight: Good  Engagement in Group:  Limited  Modes of Intervention:  Activity  Additional Comments:     Maglione,Delanee Xin E 09/18/2022, 9:52 PM

## 2022-09-18 NOTE — Group Note (Signed)
Date:  09/18/2022 Time:  6:29 PM  Group Topic/Focus:  Activity Group    Participation Level:  Active  Participation Quality:  Appropriate  Affect:  Appropriate  Cognitive:  Appropriate  Insight: Appropriate  Engagement in Group:  Engaged  Modes of Intervention:  Activity  Additional Comments:    Wilford Corner 09/18/2022, 6:29 PM

## 2022-09-18 NOTE — Progress Notes (Signed)
   09/18/22 2000  Psych Admission Type (Psych Patients Only)  Admission Status Involuntary  Psychosocial Assessment  Patient Complaints Anxiety;Depression  Eye Contact Fair  Facial Expression Flat  Affect Depressed;Anxious;Flat  Speech Logical/coherent  Interaction Guarded  Motor Activity Slow  Appearance/Hygiene Unremarkable  Behavior Characteristics Cooperative;Appropriate to situation;Anxious  Mood Anxious;Depressed  Thought Process  Coherency WDL  Content WDL  Delusions None reported or observed  Perception WDL  Hallucination None reported or observed  Judgment Limited  Confusion None  Danger to Self  Current suicidal ideation? Denies  Danger to Others  Danger to Others None reported or observed   Progress note   D: Pt seen in dayroom playing a game with a peer. Pt denies SI, HI, AVH. Pt rates pain  3/10 as soreness in her left arm where she scratched herself. Pt rates anxiety  7/10 and depression  3/10. Pt is guarded but did open up about what was causing her so much anxiety today. Her mother took pt's 2 dogs to vet for shots and the bill was $800. "That's a lot of money. I don't know if they have gone up on prices but that's a lot. I don't know if I will be able to care for them if I have to pay that much money." Stated that one of her dogs needed dental work that would cost approximately $2,000. Since shots are once a year, pt will not have to worry about it for another 12 months. Pt stated her dog eats appropriately, so dental work could be completed in installments. This seemed to help pt with perspective on the situation.   Pt's arm is looking better. Antibiotic ointment applied along with non-adherent dressing and clear tegaderm dressing. No other concerns noted at this time.  A: Pt provided support and encouragement. Pt given scheduled medication as prescribed. PRNs as appropriate. Q15 min checks for safety.   R: Pt safe on the unit. Will continue to monitor.

## 2022-09-19 DIAGNOSIS — F332 Major depressive disorder, recurrent severe without psychotic features: Secondary | ICD-10-CM | POA: Diagnosis not present

## 2022-09-19 NOTE — Plan of Care (Signed)

## 2022-09-19 NOTE — Group Note (Signed)
Recreation Therapy Group Note   Group Topic:Coping Skills  Group Date: 09/19/2022 Start Time: 1000 End Time: 1100 Facilitators: Rosina Lowenstein, LRT, CTRS Location:  Craft Room  Group Description: Mind Map.  Patient was provided a blank template of a diagram with 32 blank boxes in a tiered system, branching from the center (similar to a bubble chart). LRT directed patients to label the middle of the diagram "Coping Skills". LRT and patients then came up with 8 different coping skills as examples. Pt were directed to record their coping skills in the 2nd tier boxes closest to the center.  Patients would then share their coping skills with the group as LRT wrote them out. LRT gave a handout of 100 different coping skills at the end of group.   Goal Area(s) Addressed: Patients will be able to define "coping skills". Patient will identify new coping skills.  Patient will identify new possible leisure interests.   Affect/Mood: Constricted and Flat   Participation Level: Minimal   Participation Quality: Minimal Cues   Behavior: Disinterested, Guarded, and Reserved   Speech/Thought Process: Coherent   Insight: Limited   Judgement: Fair    Modes of Intervention: Worksheet   Patient Response to Interventions:  Disengaged   Education Outcome:  In group clarification offered    Clinical Observations/Individualized Feedback: Natalie Rose was minimally active in their participation of session activities and group discussion. Pt identified "breathing exercises" as a coping skill. Pt filled out her paper with the 8 coping skills the group came up with and not any after that. Pt sat still while picking at the dressing on her left wrist and said that it was "itching". Pt got up and left group before post-activity processing could begin.   Plan: Continue to engage patient in RT group sessions 2-3x/week.   Rosina Lowenstein, LRT, CTRS 09/19/2022 12:24 PM

## 2022-09-19 NOTE — Progress Notes (Signed)
Patient presents with sad, flat affect. Denies SI, HI, AVH. Reports depression is getting a little bit better. Says its no where near as bad as when she first arrived. Patient noted in dayroom, no interaction with peers. Minimal with staff. Eating snack. No complaints or concerns voiced. Encouragement and support provided. Safety checks maintained. Medications given as prescribed. Pt receptive and remains safe on unit with q 15 min checks.

## 2022-09-19 NOTE — Plan of Care (Signed)
D: Patient alert and oriented. Patient denies pain. Patient denies depression. Patient endorses anxiety. Patient denies SI/HI/AVH. Patient has been slowly pacing the hallways of the unit.  A: Scheduled medications administered to patient, per MD orders.  Support and encouragement provided to patient.  Q15 minute safety checks maintained.   R: Patient compliant with medication administration and treatment plan. No adverse drug reactions noted. Patient remains safe on the unit at this time. Problem: Education: Goal: Knowledge of [facility] Education information/materials will improve Outcome: Progressing Goal: Verbalization of understanding the information provided will improve Outcome: Progressing   Problem: Health Behavior/Discharge Planning: Goal: Compliance with treatment plan for underlying cause of condition will improve Outcome: Progressing

## 2022-09-19 NOTE — Progress Notes (Signed)
[facility] MD Progress Note  09/19/2022 11:32 AM Natalie Rose  MRN:  [id number] Subjective: Patient seen and chart reviewed.  42 year old woman with major depression.  Continues to complain of depressed mood and mostly of anxiety.  Panic like symptoms in the morning.  Denies suicidal thoughts.  She is out of her room interacting and going to groups. Principal Problem: Major depressive disorder, recurrent severe without psychotic features Diagnosis: Principal Problem:   Major depressive disorder, recurrent severe without psychotic features Active Problems:   PTSD (post-traumatic stress disorder)   Self-inflicted laceration of wrist   Cannabis abuse   Panic attack   OCD (obsessive compulsive disorder)  Total Time spent with patient: 30 minutes  Past Psychiatric History: Past history of anxiety and depression  Past Medical History:  Past Medical History:  Diagnosis Date   Anxiety    Arthritis    Depression    Diabetes mellitus without complication    Mental health disorder    Migraines     Past Surgical History:  Procedure Laterality Date   MOUTH SURGERY     Family History:  Family History  Problem Relation Age of Onset   Osteopenia Mother    Heart failure Mother    Osteoarthritis Mother    Depression Mother    Diabetes Father    Family Psychiatric  History: See previous Social History:  Social History   Substance and Sexual Activity  Alcohol Use Not Currently     Social History   Substance and Sexual Activity  Drug Use Yes   Frequency: 3.0 times per week   Types: Marijuana    Social History   Socioeconomic History   Marital status: Single    Spouse name: Not on file   Number of children: Not on file   Years of education: Not on file   Highest education level: Not on file  Occupational History   Not on file  Tobacco Use   Smoking status: Every Day    Packs/day: 1    Types: Cigarettes   Smokeless tobacco: Never  Vaping Use   Vaping Use: Never  used  Substance and Sexual Activity   Alcohol use: Not Currently   Drug use: Yes    Frequency: 3.0 times per week    Types: Marijuana   Sexual activity: Not Currently    Birth control/protection: None  Other Topics Concern   Not on file  Social History Narrative   Not on file   Social Determinants of Health   Financial Resource Strain: Not on file  Food Insecurity: No Food Insecurity (09/15/2022)   Hunger Vital Sign    Worried About Running Out of Food in the Last Year: Never true    Ran Out of Food in the Last Year: Never true  Recent Concern: Food Insecurity - Food Insecurity Present (06/24/2022)   Hunger Vital Sign    Worried About Running Out of Food in the Last Year: Sometimes true    Ran Out of Food in the Last Year: Often true  Transportation Needs: No Transportation Needs (09/15/2022)   PRAPARE - Administrator, Civil Service (Medical): No    Lack of Transportation (Non-Medical): No  Recent Concern: Transportation Needs - Unmet Transportation Needs (06/24/2022)   PRAPARE - Administrator, Civil Service (Medical): Yes    Lack of Transportation (Non-Medical): Yes  Physical Activity: Not on file  Stress: Not on file  Social Connections: Not on file   Additional Social  History:                         Sleep: Fair  Appetite:  Fair  Current Medications: Current Facility-Administered Medications  Medication Dose Route Frequency Provider Last Rate Last Admin   acetaminophen (TYLENOL) tablet 650 mg  650 mg Oral Q6H PRN Charm Rings, NP   650 mg at 09/18/22 1122   alum & mag hydroxide-simeth (MAALOX/MYLANTA) 200-200-20 MG/5ML suspension 30 mL  30 mL Oral Q4H PRN Charm Rings, NP       atenolol (TENORMIN) tablet 50 mg  50 mg Oral Daily Abrie Egloff, [name], MD   50 mg at 09/19/22 1030   clonazePAM (KLONOPIN) tablet 0.5 mg  0.5 mg Oral BID Olaf Mesa T, MD   0.5 mg at 09/19/22 9629   gabapentin (NEURONTIN) capsule 300 mg  300 mg Oral TID  Charm Rings, NP   300 mg at 09/19/22 5284   hydrOXYzine (ATARAX) tablet 50 mg  50 mg Oral TID PRN Charm Rings, NP   50 mg at 09/19/22 0813   magnesium hydroxide (MILK OF MAGNESIA) suspension 30 mL  30 mL Oral Daily PRN Charm Rings, NP       metFORMIN (GLUCOPHAGE) tablet 500 mg  500 mg Oral BID WC Charm Rings, NP   500 mg at 09/19/22 1324   mirtazapine (REMERON) tablet 45 mg  45 mg Oral QHS Desera Graffeo, [name], MD   45 mg at 09/18/22 2101   neomycin-bacitracin-polymyxin (NEOSPORIN) ointment   Topical BID Nickoli Bagheri, [name], MD   1 Application at 09/18/22 1707   nicotine (NICODERM CQ - dosed in mg/24 hours) patch 14 mg  14 mg Transdermal Daily Charm Rings, NP       OLANZapine (ZYPREXA) tablet 10 mg  10 mg Oral BID PRN Charm Rings, NP       Or   OLANZapine (ZYPREXA) injection 10 mg  10 mg Intramuscular BID PRN Charm Rings, NP       OLANZapine (ZYPREXA) tablet 10 mg  10 mg Oral QHS Brylea Pita, [name], MD   10 mg at 09/18/22 2102   prazosin (MINIPRESS) capsule 4 mg  4 mg Oral QHS Elivia Robotham T, MD   4 mg at 09/18/22 2102   promethazine (PHENERGAN) tablet 25 mg  25 mg Oral Q6H PRN Liberty Seto, [name], MD       propranolol (INDERAL) tablet 10 mg  10 mg Oral TID Aldena Worm, [name], MD   10 mg at 09/18/22 1703   QUEtiapine (SEROQUEL) tablet 50 mg  50 mg Oral BID Calli Bashor, [name], MD   50 mg at 09/19/22 0810   sertraline (ZOLOFT) tablet 150 mg  150 mg Oral Daily Charm Rings, NP   150 mg at 09/19/22 4010   traZODone (DESYREL) tablet 100 mg  100 mg Oral QHS Charm Rings, NP   100 mg at 09/18/22 2102    Lab Results: No results found for this or any previous visit (from the past 48 hour(s)).  Blood Alcohol level:  Lab Results  Component Value Date   [facility] <10 09/14/2022   ETH <10 09/13/2022    Metabolic Disorder Labs: Lab Results  Component Value Date   HGBA1C 6.2 (H) 09/17/2022   MPG 131.24 09/17/2022   MPG 116.89 06/29/2022   No results found for: "PROLACTIN" Lab Results   Component Value Date   CHOL 201 (H) 09/17/2022  TRIG 100 09/17/2022   HDL 35 (L) 09/17/2022   CHOLHDL 5.7 09/17/2022   VLDL 20 09/17/2022   LDLCALC 146 (H) 09/17/2022   LDLCALC 144 (H) 06/29/2022    Physical Findings: AIMS: Facial and Oral Movements Muscles of Facial Expression: None, normal Lips and Perioral Area: None, normal Jaw: None, normal Tongue: None, normal,Extremity Movements Upper (arms, wrists, hands, fingers): None, normal Lower (legs, knees, ankles, toes): None, normal, Trunk Movements Neck, shoulders, hips: None, normal, Overall Severity Severity of abnormal movements (highest score from questions above): None, normal Incapacitation due to abnormal movements: None, normal Patient's awareness of abnormal movements (rate only patient's report): No Awareness, Dental Status Current problems with teeth and/or dentures?: No Does patient usually wear dentures?: No  CIWA:    COWS:     Musculoskeletal: Strength & Muscle Tone: within normal limits Gait & Station: normal Patient leans: N/A  Psychiatric Specialty Exam:  Presentation  General Appearance:  Casual  Eye Contact: Good  Speech: Clear and Coherent  Speech Volume: Normal  Handedness:No data recorded  Mood and Affect  Mood: Anxious  Affect: Congruent   Thought Process  Thought Processes: Coherent  Descriptions of Associations:Intact  Orientation:Full (Time, Place and Person)  Thought Content:WDL  History of Schizophrenia/Schizoaffective disorder:No  Duration of Psychotic Symptoms:No data recorded Hallucinations:No data recorded Ideas of Reference:None  Suicidal Thoughts:No data recorded Homicidal Thoughts:No data recorded  Sensorium  Memory: Immediate Good  Judgment: Fair  Insight: Fair   Art therapist  Concentration: Good  Attention Span: Good  Recall: Good  Fund of Knowledge: Good  Language: Good   Psychomotor Activity  Psychomotor  Activity:No data recorded  Assets  Assets: Desire for Improvement   Sleep  Sleep:No data recorded   Physical Exam: Physical Exam Vitals and nursing note reviewed.  Constitutional:      Appearance: Normal appearance.  HENT:     Head: Normocephalic and atraumatic.     Mouth/Throat:     Pharynx: Oropharynx is clear.  Eyes:     Pupils: Pupils are equal, round, and reactive to light.  Cardiovascular:     Rate and Rhythm: Normal rate and regular rhythm.  Pulmonary:     Effort: Pulmonary effort is normal.     Breath sounds: Normal breath sounds.  Abdominal:     General: Abdomen is flat.     Palpations: Abdomen is soft.  Musculoskeletal:        General: Normal range of motion.  Skin:    General: Skin is warm and dry.  Neurological:     General: No focal deficit present.     Mental Status: She is alert. Mental status is at baseline.  Psychiatric:        Attention and Perception: Attention normal.        Mood and Affect: Mood is anxious.        Speech: Speech is delayed.        Behavior: Behavior is cooperative.        Thought Content: Thought content normal.        Cognition and Memory: Cognition normal.    Review of Systems  Constitutional: Negative.   HENT: Negative.    Eyes: Negative.   Respiratory: Negative.    Cardiovascular: Negative.   Gastrointestinal: Negative.   Musculoskeletal: Negative.   Skin: Negative.   Neurological: Negative.   Psychiatric/Behavioral:  Positive for depression. The patient is nervous/anxious.    Blood pressure (!) 124/90, pulse 77, temperature 98.9 F (37.2 C), resp. rate  19, height 5\' 4"  (1.626 m), weight 75.1 kg, SpO2 98 %. Body mass index is 28.41 kg/m.   Treatment Plan Summary: Medication management and Plan continue current medication.  Psychoeducation and encouragement done.  Urged group attendance.  Possible discharge in 1 to 2 days.  Mordecai Rasmussen, MD 09/19/2022, 11:32 AM

## 2022-09-19 NOTE — Group Note (Signed)
[facility] LCSW Group Therapy Note   Group Date: 09/19/2022 Start Time: 1315 End Time: 1415   Type of Therapy/Topic:  Group Therapy:  Balance in Life  Participation Level:  Did Not Attend   Description of Group:    This group will address the concept of balance and how it feels and looks when one is unbalanced. Patients will be encouraged to process areas in their lives that are out of balance, and identify reasons for remaining unbalanced. Facilitators will guide patients utilizing problem- solving interventions to address and correct the stressor making their life unbalanced. Understanding and applying boundaries will be explored and addressed for obtaining  and maintaining a balanced life. Patients will be encouraged to explore ways to assertively make their unbalanced needs known to significant others in their lives, using other group members and facilitator for support and feedback.  Therapeutic Goals: Patient will identify two or more emotions or situations they have that consume much of in their lives. Patient will identify signs/triggers that life has become out of balance:  Patient will identify two ways to set boundaries in order to achieve balance in their lives:  Patient will demonstrate ability to communicate their needs through discussion and/or role plays  Summary of Patient Progress: X   Therapeutic Modalities:   Cognitive Behavioral Therapy Solution-Focused Therapy Assertiveness Training   Glenis Smoker, LCSW

## 2022-09-20 ENCOUNTER — Other Ambulatory Visit: Payer: Self-pay

## 2022-09-20 DIAGNOSIS — F332 Major depressive disorder, recurrent severe without psychotic features: Secondary | ICD-10-CM | POA: Diagnosis not present

## 2022-09-20 MED ORDER — HYDROXYZINE HCL 50 MG PO TABS: 50.0000 mg | ORAL_TABLET | Freq: Three times a day (TID) | ORAL | 1 refills | Status: DC | PRN

## 2022-09-20 MED ORDER — GABAPENTIN 300 MG PO CAPS: 300.0000 mg | ORAL_CAPSULE | Freq: Three times a day (TID) | ORAL | 1 refills | Status: DC

## 2022-09-20 MED ORDER — METFORMIN HCL 500 MG PO TABS: 500.0000 mg | ORAL_TABLET | Freq: Two times a day (BID) | ORAL | 1 refills | Status: DC

## 2022-09-20 MED ORDER — TRAZODONE HCL 100 MG PO TABS
100.0000 mg | ORAL_TABLET | Freq: Every day | ORAL | 0 refills | Status: DC
  Filled 2022-09-20: qty 10, 10d supply, fill #0

## 2022-09-20 MED ORDER — ATENOLOL 50 MG PO TABS
50.0000 mg | ORAL_TABLET | Freq: Every day | ORAL | 0 refills | Status: DC
  Filled 2022-09-20: qty 10, 10d supply, fill #0

## 2022-09-20 MED ORDER — PRAZOSIN HCL 2 MG PO CAPS
4.0000 mg | ORAL_CAPSULE | Freq: Every day | ORAL | 0 refills | Status: DC
  Filled 2022-09-20: qty 20, 10d supply, fill #0

## 2022-09-20 MED ORDER — ATENOLOL 50 MG PO TABS: 50.0000 mg | ORAL_TABLET | Freq: Every day | ORAL | 1 refills | Status: DC

## 2022-09-20 MED ORDER — HYDROXYZINE HCL 50 MG PO TABS
50.0000 mg | ORAL_TABLET | Freq: Three times a day (TID) | ORAL | 0 refills | Status: DC | PRN
  Filled 2022-09-20: qty 20, 7d supply, fill #0

## 2022-09-20 MED ORDER — MIRTAZAPINE 45 MG PO TABS
45.0000 mg | ORAL_TABLET | Freq: Every day | ORAL | 0 refills | Status: DC
  Filled 2022-09-20: qty 10, 10d supply, fill #0

## 2022-09-20 MED ORDER — OLANZAPINE 10 MG PO TABS: 10.0000 mg | ORAL_TABLET | Freq: Every day | ORAL | 1 refills | Status: DC

## 2022-09-20 MED ORDER — TRAZODONE HCL 100 MG PO TABS: 100.0000 mg | ORAL_TABLET | Freq: Every day | ORAL | 1 refills | Status: DC

## 2022-09-20 MED ORDER — PRAZOSIN HCL 2 MG PO CAPS: 4.0000 mg | ORAL_CAPSULE | Freq: Every day | ORAL | 1 refills | Status: DC

## 2022-09-20 MED ORDER — PROMETHAZINE HCL 25 MG PO TABS: 25.0000 mg | ORAL_TABLET | Freq: Four times a day (QID) | ORAL | 1 refills | Status: DC | PRN

## 2022-09-20 MED ORDER — CLONAZEPAM 0.5 MG PO TABS: 0.5000 mg | ORAL_TABLET | Freq: Two times a day (BID) | ORAL | 1 refills | Status: DC

## 2022-09-20 MED ORDER — PROPRANOLOL HCL 10 MG PO TABS: 10.0000 mg | ORAL_TABLET | Freq: Three times a day (TID) | ORAL | 1 refills | Status: DC

## 2022-09-20 MED ORDER — QUETIAPINE FUMARATE 50 MG PO TABS: 50.0000 mg | ORAL_TABLET | Freq: Two times a day (BID) | ORAL | 1 refills | Status: DC

## 2022-09-20 MED ORDER — METFORMIN HCL 500 MG PO TABS
500.0000 mg | ORAL_TABLET | Freq: Two times a day (BID) | ORAL | 0 refills | Status: DC
  Filled 2022-09-20: qty 20, 10d supply, fill #0

## 2022-09-20 MED ORDER — MIRTAZAPINE 45 MG PO TABS: 45.0000 mg | ORAL_TABLET | Freq: Every day | ORAL | 1 refills | Status: DC

## 2022-09-20 MED ORDER — SERTRALINE HCL 50 MG PO TABS
150.0000 mg | ORAL_TABLET | Freq: Every day | ORAL | 0 refills | Status: DC
  Filled 2022-09-20: qty 30, 10d supply, fill #0

## 2022-09-20 MED ORDER — SERTRALINE HCL 50 MG PO TABS: 150.0000 mg | ORAL_TABLET | Freq: Every day | ORAL | 1 refills | Status: DC

## 2022-09-20 MED ORDER — PROMETHAZINE HCL 25 MG PO TABS
25.0000 mg | ORAL_TABLET | Freq: Four times a day (QID) | ORAL | 0 refills | Status: DC | PRN
  Filled 2022-09-20: qty 10, 3d supply, fill #0

## 2022-09-20 MED ORDER — QUETIAPINE FUMARATE 50 MG PO TABS
50.0000 mg | ORAL_TABLET | Freq: Two times a day (BID) | ORAL | 0 refills | Status: DC
  Filled 2022-09-20: qty 20, 10d supply, fill #0

## 2022-09-20 MED ORDER — PROPRANOLOL HCL 10 MG PO TABS
10.0000 mg | ORAL_TABLET | Freq: Three times a day (TID) | ORAL | 0 refills | Status: DC
  Filled 2022-09-20: qty 30, 10d supply, fill #0

## 2022-09-20 MED ORDER — NICOTINE 14 MG/24HR TD PT24
14.0000 mg | MEDICATED_PATCH | Freq: Every day | TRANSDERMAL | 0 refills | Status: DC
  Filled 2022-09-20: qty 14, 14d supply, fill #0

## 2022-09-20 MED ORDER — OLANZAPINE 10 MG PO TABS
10.0000 mg | ORAL_TABLET | Freq: Every day | ORAL | 0 refills | Status: DC
  Filled 2022-09-20: qty 10, 10d supply, fill #0

## 2022-09-20 MED ORDER — NICOTINE 14 MG/24HR TD PT24: 14.0000 mg | MEDICATED_PATCH | Freq: Every day | TRANSDERMAL | 1 refills | Status: DC

## 2022-09-20 MED ORDER — GABAPENTIN 300 MG PO CAPS
300.0000 mg | ORAL_CAPSULE | Freq: Three times a day (TID) | ORAL | 0 refills | Status: DC
  Filled 2022-09-20: qty 30, 10d supply, fill #0

## 2022-09-20 NOTE — Group Note (Signed)
Date:  09/20/2022 Time:  11:11 PM  Group Topic/Focus:  Wrap-Up Group:   The focus of this group is to help patients review their daily goal of treatment and discuss progress on daily workbooks.    Participation Level:  Minimal  Participation Quality:  Appropriate and Attentive  Affect:  Appropriate  Cognitive:  Alert and Appropriate  Insight: Appropriate  Engagement in Group:  Improving  Modes of Intervention:  Discussion, Education, Socialization, and Support  Additional Comments:     Leonora Gores 09/20/2022, 11:11 PM

## 2022-09-20 NOTE — Group Note (Signed)
Date:  09/20/2022 Time:  2:02 AM  Group Topic/Focus:  Wrap-Up Group:   The focus of this group is to help patients review their daily goal of treatment and discuss progress on daily workbooks.    Participation Level:  Active  Participation Quality:  Appropriate  Affect:  Appropriate  Cognitive:  Alert and Appropriate  Insight: Appropriate and Good  Engagement in Group:  Limited  Modes of Intervention:  Limit-setting  Additional Comments:     [name] 09/20/2022, 2:02 AM

## 2022-09-20 NOTE — Group Note (Signed)
[facility] LCSW Group Therapy Note   Group Date: 09/20/2022 Start Time: 1310 End Time: 1410  Type of Therapy and Topic:  Group Therapy:  Feelings around Relapse and Recovery  Participation Level:  Active    Description of Group:    Patients in this group will discuss emotions they experience before and after a relapse. They will process how experiencing these feelings, or avoidance of experiencing them, relates to having a relapse. Facilitator will guide patients to explore emotions they have related to recovery. Patients will be encouraged to process which emotions are more powerful. They will be guided to discuss the emotional reaction significant others in their lives may have to patients' relapse or recovery. Patients will be assisted in exploring ways to respond to the emotions of others without this contributing to a relapse.  Therapeutic Goals: Patient will identify two or more emotions that lead to relapse for them:  Patient will identify two emotions that result when they relapse:  Patient will identify two emotions related to recovery:  Patient will demonstrate ability to communicate their needs through discussion and/or role plays.   Summary of Patient Progress: Patient was present for the entirety of group. She was actively involved in the discussion today and her affect appeared brighter than usual. Pt shared that she is trying to stop listening to the negative self-talk that she experiences and to stop self-harming. She endorsed lack of finances and transportation as risk factors for relapse. Pt shared that when she begins to become tired and stops doing things that she would typically enjoy then this is a warning sign that she is heading towards relapse. She stated that she enjoys helping people and wants to volunteer at a rest home after discharge to help her remain focused on positive things. Pt has slight insight into topic. She appeared open and receptive to feedback/comments from  both peers and facilitator.    Therapeutic Modalities:   Cognitive Behavioral Therapy Solution-Focused Therapy Assertiveness Training Relapse Prevention Therapy   Glenis Smoker, LCSW

## 2022-09-20 NOTE — Progress Notes (Signed)
Pt denies SI/HI/AVH and verbally agrees to approach staff if these become apparent or before harming themselves/others. Rates depression 0/10. Rates anxiety 0/10. Rates pain 0/10. Pt stated that she had a good day and that she was leaving tomorrow. Pt stated that she was excited and ready to go. Pt smiled when she was talking about it but is still somewhat flat otherwise. Pt slept the whole night. Scheduled medications administered to pt, per MD orders. RN provided support and encouragement to pt. Q15 min safety checks implemented and continued. Pt is safe on the unit. Plan of care on going and no other concerns expressed at this time.  09/20/22 2104  Psych Admission Type (Psych Patients Only)  Admission Status Involuntary  Psychosocial Assessment  Patient Complaints None  Eye Contact Fair  Facial Expression Flat;Other (Comment) (smiled at times)  Affect Appropriate to circumstance  Speech Logical/coherent;Slow;Soft  Interaction Assertive  Motor Activity Slow  Appearance/Hygiene Unremarkable  Behavior Characteristics Cooperative;Appropriate to situation;Calm  Mood Pleasant;Depressed  Aggressive Behavior  Effect No apparent injury  Thought Process  Coherency WDL  Content WDL  Delusions None reported or observed  Perception WDL  Hallucination None reported or observed  Judgment Impaired  Confusion None  Danger to Self  Current suicidal ideation? Denies  Danger to Others  Danger to Others None reported or observed

## 2022-09-20 NOTE — Plan of Care (Signed)
  Problem: Education: Goal: Knowledge of General Education information will improve Description: Including pain rating scale, medication(s)/side effects and non-pharmacologic comfort measures Outcome: Progressing   Problem: Nutrition: Goal: Adequate nutrition will be maintained Outcome: Progressing   Problem: Coping: Goal: Level of anxiety will decrease Outcome: Progressing   Problem: Activity: Goal: Interest or engagement in leisure activities will improve Outcome: Progressing   Problem: Coping: Goal: Coping ability will improve Outcome: Progressing

## 2022-09-20 NOTE — Plan of Care (Signed)
D: Patient alert and oriented. Patient denies pain. Patient denies depression. Patient endorses anxiety. Patient denies SI/HI/AVH. Patient has been more interactive with staff and peers on the unit throughout shift.  A: Scheduled medications administered to patient, per MD orders.  Support and encouragement provided to patient.  Q15 minute safety checks maintained.   R: Patient compliant with medication administration and treatment plan. No adverse drug reactions noted. Patient remains safe on the unit at this time. Problem: Education: Goal: Knowledge of General Education information will improve Description: Including pain rating scale, medication(s)/side effects and non-pharmacologic comfort measures Outcome: Progressing   Problem: Education: Goal: Knowledge of [facility] Education information/materials will improve Outcome: Progressing Goal: Verbalization of understanding the information provided will improve Outcome: Progressing   Problem: Health Behavior/Discharge Planning: Goal: Compliance with treatment plan for underlying cause of condition will improve Outcome: Progressing

## 2022-09-20 NOTE — Progress Notes (Signed)
[facility] MD Progress Note  09/20/2022 10:17 AM Natalie Rose  MRN:  [id number] Subjective: Patient seen and chart reviewed.  Patient acknowledges feeling somewhat better today.  Denies suicidal thoughts.  Looks calmer and is holding herself with less anxiety and interacting better.  Tolerating medicine well despite multiple high doses. Principal Problem: Major depressive disorder, recurrent severe without psychotic features Diagnosis: Principal Problem:   Major depressive disorder, recurrent severe without psychotic features Active Problems:   PTSD (post-traumatic stress disorder)   Self-inflicted laceration of wrist   Cannabis abuse   Panic attack   OCD (obsessive compulsive disorder)  Total Time spent with patient: 30 minutes  Past Psychiatric History: Past history of depression PTSD severe anxiety self-inflicted injury.  Past Medical History:  Past Medical History:  Diagnosis Date   Anxiety    Arthritis    Depression    Diabetes mellitus without complication    Mental health disorder    Migraines     Past Surgical History:  Procedure Laterality Date   MOUTH SURGERY     Family History:  Family History  Problem Relation Age of Onset   Osteopenia Mother    Heart failure Mother    Osteoarthritis Mother    Depression Mother    Diabetes Father    Family Psychiatric  History: See previous Social History:  Social History   Substance and Sexual Activity  Alcohol Use Not Currently     Social History   Substance and Sexual Activity  Drug Use Yes   Frequency: 3.0 times per week   Types: Marijuana    Social History   Socioeconomic History   Marital status: Single    Spouse name: Not on file   Number of children: Not on file   Years of education: Not on file   Highest education level: Not on file  Occupational History   Not on file  Tobacco Use   Smoking status: Every Day    Packs/day: 1    Types: Cigarettes   Smokeless tobacco: Never  Vaping Use    Vaping Use: Never used  Substance and Sexual Activity   Alcohol use: Not Currently   Drug use: Yes    Frequency: 3.0 times per week    Types: Marijuana   Sexual activity: Not Currently    Birth control/protection: None  Other Topics Concern   Not on file  Social History Narrative   Not on file   Social Determinants of Health   Financial Resource Strain: Not on file  Food Insecurity: No Food Insecurity (09/15/2022)   Hunger Vital Sign    Worried About Running Out of Food in the Last Year: Never true    Ran Out of Food in the Last Year: Never true  Recent Concern: Food Insecurity - Food Insecurity Present (06/24/2022)   Hunger Vital Sign    Worried About Running Out of Food in the Last Year: Sometimes true    Ran Out of Food in the Last Year: Often true  Transportation Needs: No Transportation Needs (09/15/2022)   PRAPARE - Administrator, Civil Service (Medical): No    Lack of Transportation (Non-Medical): No  Recent Concern: Transportation Needs - Unmet Transportation Needs (06/24/2022)   PRAPARE - Administrator, Civil Service (Medical): Yes    Lack of Transportation (Non-Medical): Yes  Physical Activity: Not on file  Stress: Not on file  Social Connections: Not on file   Additional Social History:  Sleep: Fair  Appetite:  Fair  Current Medications: Current Facility-Administered Medications  Medication Dose Route Frequency Provider Last Rate Last Admin   acetaminophen (TYLENOL) tablet 650 mg  650 mg Oral Q6H PRN Charm Rings, NP   650 mg at 09/18/22 1122   alum & mag hydroxide-simeth (MAALOX/MYLANTA) 200-200-20 MG/5ML suspension 30 mL  30 mL Oral Q4H PRN Charm Rings, NP       atenolol (TENORMIN) tablet 50 mg  50 mg Oral Daily Farouk Vivero, [name], MD   50 mg at 09/20/22 1610   clonazePAM (KLONOPIN) tablet 0.5 mg  0.5 mg Oral BID Shadrach Bartunek T, MD   0.5 mg at 09/20/22 0902   gabapentin (NEURONTIN) capsule 300 mg   300 mg Oral TID Charm Rings, NP   300 mg at 09/20/22 9604   hydrOXYzine (ATARAX) tablet 50 mg  50 mg Oral TID PRN Charm Rings, NP   50 mg at 09/19/22 0813   magnesium hydroxide (MILK OF MAGNESIA) suspension 30 mL  30 mL Oral Daily PRN Charm Rings, NP       metFORMIN (GLUCOPHAGE) tablet 500 mg  500 mg Oral BID WC Charm Rings, NP   500 mg at 09/20/22 5409   mirtazapine (REMERON) tablet 45 mg  45 mg Oral QHS Abdi Husak, [name], MD   45 mg at 09/19/22 2046   neomycin-bacitracin-polymyxin (NEOSPORIN) ointment   Topical BID Grier Czerwinski, [name], MD   Given at 09/20/22 [phone]   nicotine (NICODERM CQ - dosed in mg/24 hours) patch 14 mg  14 mg Transdermal Daily Charm Rings, NP       OLANZapine (ZYPREXA) tablet 10 mg  10 mg Oral BID PRN Charm Rings, NP       Or   OLANZapine (ZYPREXA) injection 10 mg  10 mg Intramuscular BID PRN Charm Rings, NP       OLANZapine (ZYPREXA) tablet 10 mg  10 mg Oral QHS Dariya Gainer, [name], MD   10 mg at 09/19/22 2046   prazosin (MINIPRESS) capsule 4 mg  4 mg Oral QHS Dyrell Tuccillo T, MD   4 mg at 09/19/22 2046   promethazine (PHENERGAN) tablet 25 mg  25 mg Oral Q6H PRN Brodan Grewell, [name], MD       propranolol (INDERAL) tablet 10 mg  10 mg Oral TID Dahiana Kulak, [name], MD   10 mg at 09/20/22 0851   QUEtiapine (SEROQUEL) tablet 50 mg  50 mg Oral BID Ceniyah Thorp, [name], MD   50 mg at 09/20/22 0853   sertraline (ZOLOFT) tablet 150 mg  150 mg Oral Daily Charm Rings, NP   150 mg at 09/20/22 0853   traZODone (DESYREL) tablet 100 mg  100 mg Oral QHS Charm Rings, NP   100 mg at 09/19/22 2046    Lab Results: No results found for this or any previous visit (from the past 48 hour(s)).  Blood Alcohol level:  Lab Results  Component Value Date   ETH <10 09/14/2022   ETH <10 09/13/2022    Metabolic Disorder Labs: Lab Results  Component Value Date   HGBA1C 6.2 (H) 09/17/2022   MPG 131.24 09/17/2022   MPG 116.89 06/29/2022   No results found for: "PROLACTIN" Lab  Results  Component Value Date   CHOL 201 (H) 09/17/2022   TRIG 100 09/17/2022   HDL 35 (L) 09/17/2022   CHOLHDL 5.7 09/17/2022   VLDL 20 09/17/2022   LDLCALC 146 (H)  09/17/2022   LDLCALC 144 (H) 06/29/2022    Physical Findings: AIMS: Facial and Oral Movements Muscles of Facial Expression: None, normal Lips and Perioral Area: None, normal Jaw: None, normal Tongue: None, normal,Extremity Movements Upper (arms, wrists, hands, fingers): None, normal Lower (legs, knees, ankles, toes): None, normal, Trunk Movements Neck, shoulders, hips: None, normal, Overall Severity Severity of abnormal movements (highest score from questions above): None, normal Incapacitation due to abnormal movements: None, normal Patient's awareness of abnormal movements (rate only patient's report): No Awareness, Dental Status Current problems with teeth and/or dentures?: No Does patient usually wear dentures?: No  CIWA:    COWS:     Musculoskeletal: Strength & Muscle Tone: within normal limits Gait & Station: normal Patient leans: N/A  Psychiatric Specialty Exam:  Presentation  General Appearance:  Casual  Eye Contact: Good  Speech: Clear and Coherent  Speech Volume: Normal  Handedness:No data recorded  Mood and Affect  Mood: Anxious  Affect: Congruent   Thought Process  Thought Processes: Coherent  Descriptions of Associations:Intact  Orientation:Full (Time, Place and Person)  Thought Content:WDL  History of Schizophrenia/Schizoaffective disorder:No  Duration of Psychotic Symptoms:No data recorded Hallucinations:No data recorded Ideas of Reference:None  Suicidal Thoughts:No data recorded Homicidal Thoughts:No data recorded  Sensorium  Memory: Immediate Good  Judgment: Fair  Insight: Fair   Art therapist  Concentration: Good  Attention Span: Good  Recall: Good  Fund of Knowledge: Good  Language: Good   Psychomotor Activity  Psychomotor  Activity:No data recorded  Assets  Assets: Desire for Improvement   Sleep  Sleep:No data recorded   Physical Exam: Physical Exam Vitals and nursing note reviewed.  Constitutional:      Appearance: Normal appearance.  HENT:     Head: Normocephalic and atraumatic.     Mouth/Throat:     Pharynx: Oropharynx is clear.  Eyes:     Pupils: Pupils are equal, round, and reactive to light.  Cardiovascular:     Rate and Rhythm: Normal rate and regular rhythm.  Pulmonary:     Effort: Pulmonary effort is normal.     Breath sounds: Normal breath sounds.  Abdominal:     General: Abdomen is flat.     Palpations: Abdomen is soft.  Musculoskeletal:        General: Normal range of motion.  Skin:    General: Skin is warm and dry.  Neurological:     General: No focal deficit present.     Mental Status: She is alert. Mental status is at baseline.  Psychiatric:        Attention and Perception: Attention normal.        Mood and Affect: Mood normal.        Speech: Speech normal.        Behavior: Behavior normal.        Thought Content: Thought content normal.        Cognition and Memory: Cognition normal.        Judgment: Judgment normal.    Review of Systems  Constitutional: Negative.   HENT: Negative.    Eyes: Negative.   Respiratory: Negative.    Cardiovascular: Negative.   Gastrointestinal: Negative.   Musculoskeletal: Negative.   Skin: Negative.   Neurological: Negative.   Psychiatric/Behavioral: Negative.     Blood pressure 110/76, pulse 67, temperature 98.2 F (36.8 C), temperature source Oral, resp. rate 17, height  (1.626 m), weight 75.1 kg, SpO2 99 %. Body mass index is 28.41 kg/m.   Treatment  Plan Summary: Medication management and Plan patient has had a lot of anxiety about returning home.  She was today really looking significantly better.  No indication to change medicine right now but I think that we can safely plan on discharge tomorrow if she is still  looking well.  I will start making preparations for that.  Patient agreeable.  Mordecai Rasmussen, MD 09/20/2022, 10:17 AM

## 2022-09-20 NOTE — Group Note (Signed)
Recreation Therapy Group Note   Group Topic:Relaxation  Group Date: 09/20/2022 Start Time: 1000 End Time: 1045 Facilitators: Rosina Lowenstein, LRT, CTRS Location:  Craft Room  Group Description: Meditation. LRT asks patients their current level of stress/anxiety from 1-10, with 10 being the highest. LRT educated on the benefits of mindfulness and how it can apply to everyday life post-discharge. LRT and pt's followed along to an audio script of a "guided meditation" video. LRT asked pt their level of stress and anxiety once the prompt was finished. LRT facilitated post-activity processing to gain feedback on session.  Goal Area(s) Addressed:  Patient will practice using relaxation technique. Patient will identify a new coping skill.  Patient will follow multistep directions to reduce anxiety and stress.  Affect/Mood: Appropriate   Participation Level: Active and Engaged   Participation Quality: Independent   Behavior: Appropriate, Calm, and Cooperative   Speech/Thought Process: Coherent   Insight: Fair   Judgement: Fair    Modes of Intervention: Activity   Patient Response to Interventions:  Engaged and Receptive   Education Outcome:  Acknowledges education   Clinical Observations/Individualized Feedback: Zaylei was active in their participation of session activities and group discussion. Pt identified that her anxiety level was a 4 and stress was a 3 before the session. Afterwards, she rated her anxiety a 3 and her stress a 2. Pt interacted well with LRT and peers while in group.   Plan: Continue to engage patient in RT group sessions 2-3x/week.   Rosina Lowenstein, LRT, CTRS 09/20/2022 10:57 AM

## 2022-09-21 ENCOUNTER — Ambulatory Visit: Payer: 59 | Admitting: Podiatry

## 2022-09-21 ENCOUNTER — Other Ambulatory Visit: Payer: Self-pay

## 2022-09-21 MED ORDER — TRAZODONE HCL 100 MG PO TABS: 100.0000 mg | ORAL_TABLET | Freq: Every day | ORAL | 1 refills | Status: DC

## 2022-09-21 MED ORDER — PROMETHAZINE HCL 25 MG PO TABS: 25.0000 mg | ORAL_TABLET | Freq: Four times a day (QID) | ORAL | 1 refills | Status: DC | PRN

## 2022-09-21 MED ORDER — ATENOLOL 50 MG PO TABS: 50.0000 mg | ORAL_TABLET | Freq: Every day | ORAL | 1 refills | Status: AC

## 2022-09-21 MED ORDER — SERTRALINE HCL 50 MG PO TABS: 150.0000 mg | ORAL_TABLET | Freq: Every day | ORAL | 1 refills | Status: DC

## 2022-09-21 MED ORDER — GABAPENTIN 300 MG PO CAPS: 300.0000 mg | ORAL_CAPSULE | Freq: Three times a day (TID) | ORAL | 1 refills | Status: AC

## 2022-09-21 MED ORDER — MIRTAZAPINE 45 MG PO TABS: 45.0000 mg | ORAL_TABLET | Freq: Every day | ORAL | 1 refills | Status: DC

## 2022-09-21 MED ORDER — PROPRANOLOL HCL 10 MG PO TABS: 10.0000 mg | ORAL_TABLET | Freq: Three times a day (TID) | ORAL | 1 refills | Status: DC

## 2022-09-21 MED ORDER — OLANZAPINE 10 MG PO TABS: 10.0000 mg | ORAL_TABLET | Freq: Every day | ORAL | 1 refills | Status: DC

## 2022-09-21 MED ORDER — METFORMIN HCL 500 MG PO TABS: 500.0000 mg | ORAL_TABLET | Freq: Two times a day (BID) | ORAL | 1 refills | Status: AC

## 2022-09-21 MED ORDER — CLONAZEPAM 0.5 MG PO TABS: 0.5000 mg | ORAL_TABLET | Freq: Two times a day (BID) | ORAL | 1 refills | Status: DC

## 2022-09-21 MED ORDER — NICOTINE 14 MG/24HR TD PT24: 14.0000 mg | MEDICATED_PATCH | Freq: Every day | TRANSDERMAL | 1 refills | Status: DC

## 2022-09-21 MED ORDER — PRAZOSIN HCL 2 MG PO CAPS: 4.0000 mg | ORAL_CAPSULE | Freq: Every day | ORAL | 1 refills | Status: DC

## 2022-09-21 MED ORDER — HYDROXYZINE HCL 50 MG PO TABS: 50.0000 mg | ORAL_TABLET | Freq: Three times a day (TID) | ORAL | 1 refills | Status: DC | PRN

## 2022-09-21 MED ORDER — QUETIAPINE FUMARATE 50 MG PO TABS: 50.0000 mg | ORAL_TABLET | Freq: Two times a day (BID) | ORAL | 1 refills | Status: DC

## 2022-09-21 NOTE — Group Note (Signed)
Recreation Therapy Group Note   Group Topic:Leisure Education  Group Date: 09/21/2022 Start Time: 1000 End Time: 1115 Facilitators: Rosina Lowenstein LRT, CTRS Location:  Craft Room  Group Description: Leisure. Patients were given the option to choose from singing karaoke, make origami, playing with a deck of cards, or coloring mandalas. LRT and pts discussed the importance of participating in leisure during their free time and when they're outside of the hospital. Pt identified two leisure interests and shared with the group.   Goal Area(s) Addressed:  Patient will identify a current leisure interest.  Patient will practice making a positive decision. Patient will have the opportunity to try a new leisure activity.  Affect/Mood: Appropriate and Flat   Participation Level: Moderate   Participation Quality: Independent   Behavior: Appropriate, Calm, and Cooperative   Speech/Thought Process: Coherent   Insight: Good   Judgement: Good   Modes of Intervention: Activity   Patient Response to Interventions:  Engaged and Receptive   Education Outcome:  Acknowledges education   Clinical Observations/Individualized Feedback: Natalie Rose was active in their participation of session activities and group discussion. Pt identified "go to the park with my dogs and play basketball." Pt chose to sit and listen to the music and others. LRT and peers encouraged pt to sing karaoke when they saw pt singing, however she politely declined and shared that "[name] doesn't sing for anyone" laughingly. Pt interacted well with LRT and peers while I group.    Plan: Continue to engage patient in RT group sessions 2-3x/week.   Rosina Lowenstein, LRT, CTRS 09/21/2022 11:22 AM

## 2022-09-21 NOTE — [facility] Suicide Risk Assessment (Signed)
[facility] Discharge Suicide Risk Assessment   Principal Problem: Major depressive disorder, recurrent severe without psychotic features Discharge Diagnoses: Principal Problem:   Major depressive disorder, recurrent severe without psychotic features Active Problems:   PTSD (post-traumatic stress disorder)   Self-inflicted laceration of wrist   Cannabis abuse   Panic attack   OCD (obsessive compulsive disorder)   Total Time spent with patient: 30 minutes  Musculoskeletal: Strength & Muscle Tone: within normal limits Gait & Station: normal Patient leans: N/A  Psychiatric Specialty Exam  Presentation  General Appearance:  Casual  Eye Contact: Good  Speech: Clear and Coherent  Speech Volume: Normal  Handedness:No data recorded  Mood and Affect  Mood: Anxious  Duration of Depression Symptoms: Greater than two weeks  Affect: Congruent   Thought Process  Thought Processes: Coherent  Descriptions of Associations:Intact  Orientation:Full (Time, Place and Person)  Thought Content:WDL  History of Schizophrenia/Schizoaffective disorder:No  Duration of Psychotic Symptoms:No data recorded Hallucinations:No data recorded Ideas of Reference:None  Suicidal Thoughts:No data recorded Homicidal Thoughts:No data recorded  Sensorium  Memory: Immediate Good  Judgment: Fair  Insight: Fair   Art therapist  Concentration: Good  Attention Span: Good  Recall: Good  Fund of Knowledge: Good  Language: Good   Psychomotor Activity  Psychomotor Activity:No data recorded  Assets  Assets: Desire for Improvement   Sleep  Sleep:No data recorded  Physical Exam: Physical Exam Vitals reviewed.  Constitutional:      Appearance: Normal appearance.  HENT:     Head: Normocephalic and atraumatic.     Mouth/Throat:     Pharynx: Oropharynx is clear.  Eyes:     Pupils: Pupils are equal, round, and reactive to light.  Cardiovascular:     Rate and  Rhythm: Normal rate and regular rhythm.  Pulmonary:     Effort: Pulmonary effort is normal.     Breath sounds: Normal breath sounds.  Abdominal:     General: Abdomen is flat.     Palpations: Abdomen is soft.  Musculoskeletal:        General: Normal range of motion.  Skin:    General: Skin is warm and dry.  Neurological:     General: No focal deficit present.     Mental Status: She is alert. Mental status is at baseline.  Psychiatric:        Attention and Perception: Attention normal.        Mood and Affect: Mood normal.        Speech: Speech normal.        Behavior: Behavior normal.        Thought Content: Thought content normal.        Cognition and Memory: Cognition normal.    Review of Systems  Constitutional: Negative.   HENT: Negative.    Eyes: Negative.   Respiratory: Negative.    Cardiovascular: Negative.   Gastrointestinal: Negative.   Musculoskeletal: Negative.   Skin: Negative.   Neurological: Negative.   Psychiatric/Behavioral: Negative.     Blood pressure 119/83, pulse (!) 57, temperature 97.9 F (36.6 C), temperature source Oral, resp. rate 16, height  (1.626 m), weight 75.1 kg, SpO2 97 %. Body mass index is 28.41 kg/m.  Mental Status Per Nursing Assessment::   On Admission:  Suicidal ideation indicated by patient, Self-harm thoughts, Self-harm behaviors  Demographic Factors:  Caucasian  Loss Factors: NA  Historical Factors: Impulsivity  Risk Reduction Factors:   Sense of responsibility to family, Living with another person, especially a relative,  Positive social support, and Positive therapeutic relationship  Continued Clinical Symptoms:  Severe Anxiety and/or Agitation Depression:   Impulsivity  Cognitive Features That Contribute To Risk:  None    Suicide Risk:  Minimal: No identifiable suicidal ideation.  Patients presenting with no risk factors but with morbid ruminations; may be classified as minimal risk based on the severity of  the depressive symptoms    Plan Of Care/Follow-up recommendations:  Other:  Patient has not had any dangerous behavior in the hospital.  She is stable and calm and tolerating medicine well.  She has appropriate outpatient therapy arranged and agrees to outpatient follow-up.  Reviewed medications with patient.  Prescriptions and supply provided.  Mordecai Rasmussen, MD 09/21/2022, 9:40 AM

## 2022-09-21 NOTE — Progress Notes (Signed)
Discharge Note:  Patient denies SI/HI/AVH. Discharge instructions, AVS, prescriptions, and transition record gone over with patient. Patient agrees to comply with medication management, follow-up visit, and outpatient therapy. Patient belongings returned to patient. Patient questions and concerns addressed and answered. Patient ambulatory off unit. Patient discharged to home with family friend.

## 2022-09-21 NOTE — Progress Notes (Signed)
  [facility] Adult Case Management Discharge Plan :  Will you be returning to the same living situation after discharge:  Yes,  pt plans to return home upon discharge. At discharge, do you have transportation home?: Yes,  pt support system to provide transportation from hospital. Do you have the ability to pay for your medications: Yes,  [employer] Medicare/[employer] Dual Complete.  Release of information consent forms completed and in the chart;  Patient's signature needed at discharge.  Patient to Follow up at:  Follow-up Information     [facility], [place].. Go to.   Why: Your appointment is scheduled for Wednesday, 09/26/22 at 9:00AM. Thanks! Contact information: [address] [place], [place] [address] Phone: [phone]                Next level of care provider has access to [facility] Link:no  Safety Planning and Suicide Prevention discussed: Yes,  SPE completed with pt.      Has patient been referred to the Quitline?: Patient refused referral  Patient has been referred for addiction treatment: Pt. refused referral  Glenis Smoker, LCSW 09/21/2022, 10:07 AM

## 2022-09-21 NOTE — Care Management Important Message (Signed)
Important Message  Patient Details  Name: Natalie Rose MRN: [id number] Date of Birth: 1980/12/02   Medicare Important Message Given:  Yes  Pt and CSW reviewed IM. Pt did not have further questions and did not appear to be interested in appealing discharge.    Glenis Smoker, LCSW 09/21/2022, 10:06 AM

## 2022-09-21 NOTE — Plan of Care (Signed)
Problem: Education: Goal: Knowledge of General Education information will improve Description: Including pain rating scale, medication(s)/side effects and non-pharmacologic comfort measures 09/21/2022 1445 by Delos Haring, RN Outcome: Adequate for Discharge 09/21/2022 1058 by Delos Haring, RN Outcome: Adequate for Discharge   Problem: Health Behavior/Discharge Planning: Goal: Ability to manage health-related needs will improve 09/21/2022 1445 by Delos Haring, RN Outcome: Adequate for Discharge 09/21/2022 1058 by Delos Haring, RN Outcome: Adequate for Discharge   Problem: Clinical Measurements: Goal: Ability to maintain clinical measurements within normal limits will improve 09/21/2022 1445 by Delos Haring, RN Outcome: Adequate for Discharge 09/21/2022 1058 by Delos Haring, RN Outcome: Adequate for Discharge Goal: Will remain free from infection 09/21/2022 1445 by Delos Haring, RN Outcome: Adequate for Discharge 09/21/2022 1058 by Delos Haring, RN Outcome: Adequate for Discharge Goal: Diagnostic test results will improve 09/21/2022 1445 by Delos Haring, RN Outcome: Adequate for Discharge 09/21/2022 1058 by Delos Haring, RN Outcome: Adequate for Discharge Goal: Respiratory complications will improve 09/21/2022 1445 by Delos Haring, RN Outcome: Adequate for Discharge 09/21/2022 1058 by Delos Haring, RN Outcome: Adequate for Discharge Goal: Cardiovascular complication will be avoided 09/21/2022 1445 by Delos Haring, RN Outcome: Adequate for Discharge 09/21/2022 1058 by Delos Haring, RN Outcome: Adequate for Discharge   Problem: Activity: Goal: Risk for activity intolerance will decrease 09/21/2022 1445 by Delos Haring, RN Outcome: Adequate for Discharge 09/21/2022 1058 by Delos Haring, RN Outcome: Adequate for Discharge   Problem: Nutrition: Goal: Adequate nutrition will be maintained 09/21/2022 1445 by Delos Haring, RN Outcome:  Adequate for Discharge 09/21/2022 1058 by Delos Haring, RN Outcome: Adequate for Discharge   Problem: Coping: Goal: Level of anxiety will decrease 09/21/2022 1445 by Delos Haring, RN Outcome: Adequate for Discharge 09/21/2022 1058 by Delos Haring, RN Outcome: Adequate for Discharge   Problem: Elimination: Goal: Will not experience complications related to bowel motility 09/21/2022 1445 by Delos Haring, RN Outcome: Adequate for Discharge 09/21/2022 1058 by Delos Haring, RN Outcome: Adequate for Discharge Goal: Will not experience complications related to urinary retention 09/21/2022 1445 by Delos Haring, RN Outcome: Adequate for Discharge 09/21/2022 1058 by Delos Haring, RN Outcome: Adequate for Discharge   Problem: Pain Managment: Goal: General experience of comfort will improve 09/21/2022 1445 by Delos Haring, RN Outcome: Adequate for Discharge 09/21/2022 1058 by Delos Haring, RN Outcome: Adequate for Discharge   Problem: Safety: Goal: Ability to remain free from injury will improve 09/21/2022 1445 by Delos Haring, RN Outcome: Adequate for Discharge 09/21/2022 1058 by Delos Haring, RN Outcome: Adequate for Discharge   Problem: Skin Integrity: Goal: Risk for impaired skin integrity will decrease 09/21/2022 1445 by Delos Haring, RN Outcome: Adequate for Discharge 09/21/2022 1058 by Delos Haring, RN Outcome: Adequate for Discharge   Problem: Education: Goal: Utilization of techniques to improve thought processes will improve 09/21/2022 1445 by Delos Haring, RN Outcome: Adequate for Discharge 09/21/2022 1058 by Delos Haring, RN Outcome: Adequate for Discharge Goal: Knowledge of the prescribed therapeutic regimen will improve 09/21/2022 1445 by Delos Haring, RN Outcome: Adequate for Discharge 09/21/2022 1058 by Delos Haring, RN Outcome: Adequate for Discharge   Problem: Activity: Goal: Interest or engagement in leisure activities  will improve 09/21/2022 1445 by Delos Haring, RN Outcome: Adequate for Discharge 09/21/2022 1058 by Delos Haring, RN Outcome: Adequate for Discharge Goal:  Imbalance in normal sleep/wake cycle will improve 09/21/2022 1445 by Delos Haring, RN Outcome: Adequate for Discharge 09/21/2022 1058 by Delos Haring, RN Outcome: Adequate for Discharge   Problem: Coping: Goal: Coping ability will improve 09/21/2022 1445 by Delos Haring, RN Outcome: Adequate for Discharge 09/21/2022 1058 by Delos Haring, RN Outcome: Adequate for Discharge Goal: Will verbalize feelings 09/21/2022 1445 by Delos Haring, RN Outcome: Adequate for Discharge 09/21/2022 1058 by Delos Haring, RN Outcome: Adequate for Discharge   Problem: Health Behavior/Discharge Planning: Goal: Ability to make decisions will improve 09/21/2022 1445 by Delos Haring, RN Outcome: Adequate for Discharge 09/21/2022 1058 by Delos Haring, RN Outcome: Adequate for Discharge Goal: Compliance with therapeutic regimen will improve 09/21/2022 1445 by Delos Haring, RN Outcome: Adequate for Discharge 09/21/2022 1058 by Delos Haring, RN Outcome: Adequate for Discharge   Problem: Role Relationship: Goal: Will demonstrate positive changes in social behaviors and relationships 09/21/2022 1445 by Delos Haring, RN Outcome: Adequate for Discharge 09/21/2022 1058 by Delos Haring, RN Outcome: Adequate for Discharge   Problem: Safety: Goal: Ability to disclose and discuss suicidal ideas will improve 09/21/2022 1445 by Delos Haring, RN Outcome: Adequate for Discharge 09/21/2022 1058 by Delos Haring, RN Outcome: Adequate for Discharge Goal: Ability to identify and utilize support systems that promote safety will improve 09/21/2022 1445 by Delos Haring, RN Outcome: Adequate for Discharge 09/21/2022 1058 by Delos Haring, RN Outcome: Adequate for Discharge   Problem: Self-Concept: Goal: Will verbalize positive  feelings about self 09/21/2022 1445 by Delos Haring, RN Outcome: Adequate for Discharge 09/21/2022 1058 by Delos Haring, RN Outcome: Adequate for Discharge Goal: Level of anxiety will decrease 09/21/2022 1445 by Delos Haring, RN Outcome: Adequate for Discharge 09/21/2022 1058 by Delos Haring, RN Outcome: Adequate for Discharge   Problem: Education: Goal: Ability to make informed decisions regarding treatment will improve 09/21/2022 1445 by Delos Haring, RN Outcome: Adequate for Discharge 09/21/2022 1058 by Delos Haring, RN Outcome: Adequate for Discharge   Problem: Coping: Goal: Coping ability will improve 09/21/2022 1445 by Delos Haring, RN Outcome: Adequate for Discharge 09/21/2022 1058 by Delos Haring, RN Outcome: Adequate for Discharge   Problem: Health Behavior/Discharge Planning: Goal: Identification of resources available to assist in meeting health care needs will improve 09/21/2022 1445 by Delos Haring, RN Outcome: Adequate for Discharge 09/21/2022 1058 by Delos Haring, RN Outcome: Adequate for Discharge   Problem: Medication: Goal: Compliance with prescribed medication regimen will improve 09/21/2022 1445 by Delos Haring, RN Outcome: Adequate for Discharge 09/21/2022 1058 by Delos Haring, RN Outcome: Adequate for Discharge   Problem: Self-Concept: Goal: Ability to disclose and discuss suicidal ideas will improve 09/21/2022 1445 by Delos Haring, RN Outcome: Adequate for Discharge 09/21/2022 1058 by Delos Haring, RN Outcome: Adequate for Discharge Goal: Will verbalize positive feelings about self 09/21/2022 1445 by Delos Haring, RN Outcome: Adequate for Discharge 09/21/2022 1058 by Delos Haring, RN Outcome: Adequate for Discharge   Problem: Education: Goal: Knowledge of [place] [facility] Education information/materials will improve 09/21/2022 1445 by Delos Haring, RN Outcome: Adequate for Discharge 09/21/2022 1058 by  Delos Haring, RN Outcome: Adequate for Discharge Goal: Emotional status will improve 09/21/2022 1445 by Delos Haring, RN Outcome: Adequate for Discharge 09/21/2022 1058 by Delos Haring, RN Outcome: Adequate for Discharge Goal: Mental status will improve 09/21/2022 1445 by  Delos Haring, RN Outcome: Adequate for Discharge 09/21/2022 1058 by Delos Haring, RN Outcome: Adequate for Discharge Goal: Verbalization of understanding the information provided will improve 09/21/2022 1445 by Delos Haring, RN Outcome: Adequate for Discharge 09/21/2022 1058 by Delos Haring, RN Outcome: Adequate for Discharge   Problem: Activity: Goal: Interest or engagement in activities will improve 09/21/2022 1445 by Delos Haring, RN Outcome: Adequate for Discharge 09/21/2022 1058 by Delos Haring, RN Outcome: Adequate for Discharge Goal: Sleeping patterns will improve 09/21/2022 1445 by Delos Haring, RN Outcome: Adequate for Discharge 09/21/2022 1058 by Delos Haring, RN Outcome: Adequate for Discharge   Problem: Coping: Goal: Ability to verbalize frustrations and anger appropriately will improve 09/21/2022 1445 by Delos Haring, RN Outcome: Adequate for Discharge 09/21/2022 1058 by Delos Haring, RN Outcome: Adequate for Discharge Goal: Ability to demonstrate self-control will improve 09/21/2022 1445 by Delos Haring, RN Outcome: Adequate for Discharge 09/21/2022 1058 by Delos Haring, RN Outcome: Adequate for Discharge   Problem: Health Behavior/Discharge Planning: Goal: Identification of resources available to assist in meeting health care needs will improve 09/21/2022 1445 by Delos Haring, RN Outcome: Adequate for Discharge 09/21/2022 1058 by Delos Haring, RN Outcome: Adequate for Discharge Goal: Compliance with treatment plan for underlying cause of condition will improve 09/21/2022 1445 by Delos Haring, RN Outcome: Adequate for Discharge 09/21/2022 1058 by  Delos Haring, RN Outcome: Adequate for Discharge   Problem: Physical Regulation: Goal: Ability to maintain clinical measurements within normal limits will improve 09/21/2022 1445 by Delos Haring, RN Outcome: Adequate for Discharge 09/21/2022 1058 by Delos Haring, RN Outcome: Adequate for Discharge   Problem: Safety: Goal: Periods of time without injury will increase 09/21/2022 1445 by Delos Haring, RN Outcome: Adequate for Discharge 09/21/2022 1058 by Delos Haring, RN Outcome: Adequate for Discharge

## 2022-09-21 NOTE — Plan of Care (Signed)
Problem: Education: Goal: Knowledge of General Education information will improve Description: Including pain rating scale, medication(s)/side effects and non-pharmacologic comfort measures Outcome: Adequate for Discharge   Problem: Health Behavior/Discharge Planning: Goal: Ability to manage health-related needs will improve Outcome: Adequate for Discharge   Problem: Clinical Measurements: Goal: Ability to maintain clinical measurements within normal limits will improve Outcome: Adequate for Discharge Goal: Will remain free from infection Outcome: Adequate for Discharge Goal: Diagnostic test results will improve Outcome: Adequate for Discharge Goal: Respiratory complications will improve Outcome: Adequate for Discharge Goal: Cardiovascular complication will be avoided Outcome: Adequate for Discharge   Problem: Activity: Goal: Risk for activity intolerance will decrease Outcome: Adequate for Discharge   Problem: Nutrition: Goal: Adequate nutrition will be maintained Outcome: Adequate for Discharge   Problem: Coping: Goal: Level of anxiety will decrease Outcome: Adequate for Discharge   Problem: Elimination: Goal: Will not experience complications related to bowel motility Outcome: Adequate for Discharge Goal: Will not experience complications related to urinary retention Outcome: Adequate for Discharge   Problem: Pain Managment: Goal: General experience of comfort will improve Outcome: Adequate for Discharge   Problem: Safety: Goal: Ability to remain free from injury will improve Outcome: Adequate for Discharge   Problem: Skin Integrity: Goal: Risk for impaired skin integrity will decrease Outcome: Adequate for Discharge   Problem: Education: Goal: Utilization of techniques to improve thought processes will improve Outcome: Adequate for Discharge Goal: Knowledge of the prescribed therapeutic regimen will improve Outcome: Adequate for Discharge   Problem:  Activity: Goal: Interest or engagement in leisure activities will improve Outcome: Adequate for Discharge Goal: Imbalance in normal sleep/wake cycle will improve Outcome: Adequate for Discharge   Problem: Coping: Goal: Coping ability will improve Outcome: Adequate for Discharge Goal: Will verbalize feelings Outcome: Adequate for Discharge   Problem: Health Behavior/Discharge Planning: Goal: Ability to make decisions will improve Outcome: Adequate for Discharge Goal: Compliance with therapeutic regimen will improve Outcome: Adequate for Discharge   Problem: Role Relationship: Goal: Will demonstrate positive changes in social behaviors and relationships Outcome: Adequate for Discharge   Problem: Safety: Goal: Ability to disclose and discuss suicidal ideas will improve Outcome: Adequate for Discharge Goal: Ability to identify and utilize support systems that promote safety will improve Outcome: Adequate for Discharge   Problem: Self-Concept: Goal: Will verbalize positive feelings about self Outcome: Adequate for Discharge Goal: Level of anxiety will decrease Outcome: Adequate for Discharge   Problem: Education: Goal: Ability to make informed decisions regarding treatment will improve Outcome: Adequate for Discharge   Problem: Coping: Goal: Coping ability will improve Outcome: Adequate for Discharge   Problem: Health Behavior/Discharge Planning: Goal: Identification of resources available to assist in meeting health care needs will improve Outcome: Adequate for Discharge   Problem: Medication: Goal: Compliance with prescribed medication regimen will improve Outcome: Adequate for Discharge   Problem: Self-Concept: Goal: Ability to disclose and discuss suicidal ideas will improve Outcome: Adequate for Discharge Goal: Will verbalize positive feelings about self Outcome: Adequate for Discharge   Problem: Education: Goal: Knowledge of [place] [facility] Education  information/materials will improve Outcome: Adequate for Discharge Goal: Emotional status will improve Outcome: Adequate for Discharge Goal: Mental status will improve Outcome: Adequate for Discharge Goal: Verbalization of understanding the information provided will improve Outcome: Adequate for Discharge   Problem: Activity: Goal: Interest or engagement in activities will improve Outcome: Adequate for Discharge Goal: Sleeping patterns will improve Outcome: Adequate for Discharge   Problem: Coping: Goal: Ability to verbalize frustrations and anger  appropriately will improve Outcome: Adequate for Discharge Goal: Ability to demonstrate self-control will improve Outcome: Adequate for Discharge   Problem: Health Behavior/Discharge Planning: Goal: Identification of resources available to assist in meeting health care needs will improve Outcome: Adequate for Discharge Goal: Compliance with treatment plan for underlying cause of condition will improve Outcome: Adequate for Discharge   Problem: Physical Regulation: Goal: Ability to maintain clinical measurements within normal limits will improve Outcome: Adequate for Discharge   Problem: Safety: Goal: Periods of time without injury will increase Outcome: Adequate for Discharge

## 2022-09-21 NOTE — Discharge Summary (Signed)
Physician Discharge Summary Note  Patient:  Natalie Rose is an 41 y.o., female MRN:  [id number] DOB:  19-Jan-1981 Patient phone:  [phone] (home)  Patient address:   [address] [place] [address],  Total Time spent with patient: 30 minutes  Date of Admission:  09/15/2022 Date of Discharge: 09/21/2022  Reason for Admission: Patient was admitted because of severe anxiety panic attacks OCD-like symptoms with suicidal ideation.  Principal Problem: Major depressive disorder, recurrent severe without psychotic features Discharge Diagnoses: Principal Problem:   Major depressive disorder, recurrent severe without psychotic features Active Problems:   PTSD (post-traumatic stress disorder)   Self-inflicted laceration of wrist   Cannabis abuse   Panic attack   OCD (obsessive compulsive disorder)   Past Psychiatric History: Patient has a long history of chronic severe anxiety and depression with various diagnoses including PTSD panic attacks depression and OCD.  Has had several prior hospitalizations.  [facility]  Has had self-injury in the past and did some self injuring this time as well.  Past Medical History:  Past Medical History:  Diagnosis Date   Anxiety    Arthritis    Depression    Diabetes mellitus without complication    Mental health disorder    Migraines     Past Surgical History:  Procedure Laterality Date   MOUTH SURGERY     Family History:  Family History  Problem Relation Age of Onset   Osteopenia Mother    Heart failure Mother    Osteoarthritis Mother    Depression Mother    Diabetes Father    Family Psychiatric  History: Past history of anxiety and depression Social History:  Social History   Substance and Sexual Activity  Alcohol Use Not Currently     Social History   Substance and Sexual Activity  Drug Use Yes   Frequency: 3.0 times per week   Types: Marijuana    Social History    Socioeconomic History   Marital status: Single    Spouse name: Not on file   Number of children: Not on file   Years of education: Not on file   Highest education level: Not on file  Occupational History   Not on file  Tobacco Use   Smoking status: Every Day    Packs/day: 1    Types: Cigarettes   Smokeless tobacco: Never  Vaping Use   Vaping Use: Never used  Substance and Sexual Activity   Alcohol use: Not Currently   Drug use: Yes    Frequency: 3.0 times per week    Types: Marijuana   Sexual activity: Not Currently    Birth control/protection: None  Other Topics Concern   Not on file  Social History Narrative   Not on file   Social Determinants of Health   Financial Resource Strain: Not on file  Food Insecurity: No Food Insecurity (09/15/2022)   Hunger Vital Sign    Worried About Running Out of Food in the Last Year: Never true    Ran Out of Food in the Last Year: Never true  Recent Concern: Food Insecurity - Food Insecurity Present (06/24/2022)   Hunger Vital Sign    Worried About Running Out of Food in the Last Year: Sometimes true    Ran Out of Food in the Last Year: Often true  Transportation Needs: No Transportation Needs (09/15/2022)   PRAPARE - Administrator, Civil Service (Medical): No  Lack of Transportation (Non-Medical): No  Recent Concern: Transportation Needs - Unmet Transportation Needs (06/24/2022)   PRAPARE - Administrator, Civil Service (Medical): Yes    Lack of Transportation (Non-Medical): Yes  Physical Activity: Not on file  Stress: Not on file  Social Connections: Not on file    Hospital Course: Admitted to psychiatric hospital.  15-minute checks continued.  Supportive counseling therapy and engagement in appropriate groups was provided.  Medications were addressed and were adjusted somewhat to better deal with anxiety symptoms.  Patient initially was very panicky but was able to work on her anxiety and calm down and  by the time of discharge was denying any suicidal or homicidal ideation and not showing any dangerous behavior.  Agrees to the plan to return home on current medicine and follow-up with her outpatient psychiatry team.  Prescriptions and a supply of medicine are provided.  Physical Findings: AIMS: Facial and Oral Movements Muscles of Facial Expression: None, normal Lips and Perioral Area: None, normal Jaw: None, normal Tongue: None, normal,Extremity Movements Upper (arms, wrists, hands, fingers): None, normal Lower (legs, knees, ankles, toes): None, normal, Trunk Movements Neck, shoulders, hips: None, normal, Overall Severity Severity of abnormal movements (highest score from questions above): None, normal Incapacitation due to abnormal movements: None, normal Patient's awareness of abnormal movements (rate only patient's report): No Awareness, Dental Status Current problems with teeth and/or dentures?: No Does patient usually wear dentures?: No  CIWA:    COWS:     Musculoskeletal: Strength & Muscle Tone: within normal limits Gait & Station: normal Patient leans:     Psychiatric Specialty Exam:  Presentation  General Appearance:  Casual  Eye Contact: Good  Speech: Clear and Coherent  Speech Volume: Normal  Handedness:No data recorded  Mood and Affect  Mood: Anxious  Affect: Congruent   Thought Process  Thought Processes: Coherent  Descriptions of Associations:Intact  Orientation:Full (Time, Place and Person)  Thought Content:WDL  History of Schizophrenia/Schizoaffective disorder:No  Duration of Psychotic Symptoms:No data recorded Hallucinations:No data recorded Ideas of Reference:None  Suicidal Thoughts:No data recorded Homicidal Thoughts:No data recorded  Sensorium  Memory: Immediate Good  Judgment: Fair  Insight: Fair   Art therapist  Concentration: Good  Attention Span: Good  Recall: Good  Fund of  Knowledge: Good  Language: Good   Psychomotor Activity  Psychomotor Activity:No data recorded  Assets  Assets: Desire for Improvement   Sleep  Sleep:No data recorded   Physical Exam: Physical Exam Vitals and nursing note reviewed.  Constitutional:      Appearance: Normal appearance.  HENT:     Head: Normocephalic and atraumatic.     Mouth/Throat:     Pharynx: Oropharynx is clear.  Eyes:     Pupils: Pupils are equal, round, and reactive to light.  Cardiovascular:     Rate and Rhythm: Normal rate and regular rhythm.  Pulmonary:     Effort: Pulmonary effort is normal.     Breath sounds: Normal breath sounds.  Abdominal:     General: Abdomen is flat.     Palpations: Abdomen is soft.  Musculoskeletal:        General: Normal range of motion.  Skin:    General: Skin is warm and dry.  Neurological:     General: No focal deficit present.     Mental Status: She is alert. Mental status is at baseline.  Psychiatric:        Attention and Perception: Attention normal.  Mood and Affect: Mood normal.        Speech: Speech normal.        Behavior: Behavior is cooperative.        Thought Content: Thought content normal.        Cognition and Memory: Cognition normal.        Judgment: Judgment normal.    Review of Systems  Constitutional: Negative.   HENT: Negative.    Eyes: Negative.   Respiratory: Negative.    Cardiovascular: Negative.   Gastrointestinal: Negative.   Musculoskeletal: Negative.   Skin: Negative.   Neurological: Negative.   Psychiatric/Behavioral: Negative.     Blood pressure 119/83, pulse (!) 57, temperature 97.9 F (36.6 C), temperature source Oral, resp. rate 16, height 5\' 4"  (1.626 m), weight 75.1 kg, SpO2 97 %. Body mass index is 28.41 kg/m.   Social History   Tobacco Use  Smoking Status Every Day   Packs/day: 1   Types: Cigarettes  Smokeless Tobacco Never   Tobacco Cessation:  A prescription for an FDA-approved tobacco cessation  medication provided at discharge   Blood Alcohol level:  Lab Results  Component Value Date   [facility] <10 09/14/2022   ETH <10 09/13/2022    Metabolic Disorder Labs:  Lab Results  Component Value Date   HGBA1C 6.2 (H) 09/17/2022   MPG 131.24 09/17/2022   MPG 116.89 06/29/2022   No results found for: "PROLACTIN" Lab Results  Component Value Date   CHOL 201 (H) 09/17/2022   TRIG 100 09/17/2022   HDL 35 (L) 09/17/2022   CHOLHDL 5.7 09/17/2022   VLDL 20 09/17/2022   LDLCALC 146 (H) 09/17/2022   LDLCALC 144 (H) 06/29/2022    See Psychiatric Specialty Exam and Suicide Risk Assessment completed by Attending Physician prior to discharge.  Discharge destination:  Home  Is patient on multiple antipsychotic therapies at discharge:  Yes,   Do you recommend tapering to monotherapy for antipsychotics?  No   Has Patient had three or more failed trials of antipsychotic monotherapy by history:  No  Recommended Plan for Multiple Antipsychotic Therapies: Additional reason(s) for multiple antispychotic treatment:  The medicines are really being used for 2 different things.  I would not presume to tell her outpatient providers what to do.  They can make their own decisions about appropriate follow-up.  Discharge Instructions     Diet - low sodium heart healthy   Complete by: As directed    Increase activity slowly   Complete by: As directed       Allergies as of 09/21/2022       Reactions   Diazepam Hives, Anxiety, Itching, Other (See Comments), Rash   unknown unknown   Lurasidone         Medication List     STOP taking these medications    ondansetron 4 MG disintegrating tablet Commonly known as: Zofran ODT   Vitamin D-1000 Max St 25 MCG (1000 UT) tablet Generic drug: Cholecalciferol       TAKE these medications      Indication  atenolol 50 MG tablet Commonly known as: TENORMIN Take 1 tablet (50 mg total) by mouth daily. What changed: when to take this   Indication: High Blood Pressure Disorder   clonazePAM 0.5 MG tablet Commonly known as: KLONOPIN Take 1 tablet (0.5 mg total) by mouth 2 (two) times daily.  Indication: Feeling Anxious   gabapentin 300 MG capsule Commonly known as: NEURONTIN Take 1 capsule (300 mg total) by mouth  3 (three) times daily.  Indication: Social Anxiety Disorder   hydrOXYzine 50 MG tablet Commonly known as: ATARAX Take 1 tablet (50 mg total) by mouth 3 (three) times daily as needed. What changed:  how much to take reasons to take this  Indication: Feeling Anxious   metFORMIN 500 MG tablet Commonly known as: GLUCOPHAGE Take 1 tablet (500 mg total) by mouth 2 (two) times daily with a meal.  Indication: Type 2 Diabetes   mirtazapine 45 MG tablet Commonly known as: REMERON Take 1 tablet (45 mg total) by mouth at bedtime.  Indication: Major Depressive Disorder, Panic Disorder   nicotine 14 mg/24hr patch Commonly known as: NICODERM CQ - dosed in mg/24 hours Place 1 patch (14 mg total) onto the skin daily.  Indication: Nicotine Addiction   OLANZapine 10 MG tablet Commonly known as: ZYPREXA Take 1 tablet (10 mg total) by mouth at bedtime.  Indication: MIXED BIPOLAR AFFECTIVE DISORDER   prazosin 2 MG capsule Commonly known as: MINIPRESS Take 2 capsules (4 mg total) by mouth at bedtime.  Indication: Frightening Dreams, Disturbed Sleep   promethazine 25 MG tablet Commonly known as: PHENERGAN Take 1 tablet (25 mg total) by mouth every 6 (six) hours as needed for nausea or vomiting.  Indication: Nausea and Vomiting in Pregnancy   propranolol 10 MG tablet Commonly known as: INDERAL Take 1 tablet (10 mg total) by mouth 3 (three) times daily. What changed: when to take this  Indication: Feeling Anxious   QUEtiapine 50 MG tablet Commonly known as: SEROQUEL Take 1 tablet (50 mg total) by mouth 2 (two) times daily.  Indication: Manic Phase of Manic-Depression   sertraline 50 MG tablet Commonly  known as: ZOLOFT Take 3 tablets (150 mg total) by mouth daily.  Indication: Generalized Anxiety Disorder, Panic Disorder   traZODone 100 MG tablet Commonly known as: DESYREL Take 1 tablet (100 mg total) by mouth at bedtime.  Indication: Trouble Sleeping         Follow-up recommendations:  Other:  Follow-up with [facility]  Comments: See above  Signed: Mordecai Rasmussen, MD 09/21/2022, 9:42 AM

## 2022-09-21 NOTE — [facility] Suicide Risk Assessment (Signed)
[facility] INPATIENT:  Family/Significant Other Suicide Prevention Education  Suicide Prevention Education:  Patient Refusal for Family/Significant Other Suicide Prevention Education: The patient Natalie Rose has refused to provide written consent for family/significant other to be provided Family/Significant Other Suicide Prevention Education during admission and/or prior to discharge.  Physician notified.  SPE completed with pt, as pt refused to consent to family contact. SPI pamphlet provided to pt and pt was encouraged to share information with support network, ask questions, and talk about any concerns relating to SPE. Pt denies access to guns/firearms and verbalized understanding of information provided. Mobile Crisis information also provided to pt.  Glenis Smoker 09/21/2022, 10:10 AM

## 2022-10-16 ENCOUNTER — Ambulatory Visit (INDEPENDENT_AMBULATORY_CARE_PROVIDER_SITE_OTHER): Payer: 59 | Admitting: Podiatry

## 2022-10-16 DIAGNOSIS — B353 Tinea pedis: Secondary | ICD-10-CM

## 2022-10-16 DIAGNOSIS — B351 Tinea unguium: Secondary | ICD-10-CM

## 2022-10-16 DIAGNOSIS — M79675 Pain in left toe(s): Secondary | ICD-10-CM | POA: Diagnosis not present

## 2022-10-16 DIAGNOSIS — M79674 Pain in right toe(s): Secondary | ICD-10-CM | POA: Diagnosis not present

## 2022-10-16 MED ORDER — CLOTRIMAZOLE-BETAMETHASONE 1-0.05 % EX CREA: 1.0000 | TOPICAL_CREAM | Freq: Two times a day (BID) | CUTANEOUS | 0 refills | Status: DC

## 2022-10-16 NOTE — Progress Notes (Signed)
  Subjective:  Patient ID: Natalie Rose, female    DOB: 1980/08/06,  MRN: [id number]  Chief Complaint  Patient presents with   Diabetes    Nail trim    42 y.o. female returns for the above complaint.  Patient presents with thickened elongated dystrophic mycotic toenails x 10 mild pain on palpation.  She states she is not able to put on her social life immediately.  She has secondary complaint of athlete's foot to bilateral feet.  She would like for me to discuss treatment options she is tried over-the-counter option which has not helped  Objective:  There were no vitals filed for this visit. Podiatric Exam: Vascular: dorsalis pedis and posterior tibial pulses are palpable bilateral. Capillary return is immediate. Temperature gradient is WNL. Skin turgor WNL  Sensorium: Normal Semmes Weinstein monofilament test. Normal tactile sensation bilaterally. Nail Exam: Pt has thick disfigured discolored nails with subungual debris noted bilateral entire nail hallux through fifth toenails.  Pain on palpation to the nails. Ulcer Exam: There is no evidence of ulcer or pre-ulcerative changes or infection. Orthopedic Exam: Muscle tone and strength are WNL. No limitations in general ROM. No crepitus or effusions noted.  Skin: No Porokeratosis. No infection or ulcers    Assessment & Plan:   1. Pain due to onychomycosis of toenails of both feet   2. Tinea pedis of both feet     Patient was evaluated and treated and all questions answered.  Bilateral athlete's foot -I explained to patient the etiology of athlete's foot and worse treatment options were discussed.  Given that she has failed over-the-counter options she will benefit from Lotrisone cream Lotrisone cream was sent to the pharmacy have asked her to apply twice a day  Onychomycosis with pain  -Nails palliatively debrided as below. -Educated on self-care  Procedure: Nail Debridement Rationale: pain  Type of Debridement:  manual, sharp debridement. Instrumentation: Nail nipper, rotary burr. Number of Nails: 10  Procedures and Treatment: Consent by patient was obtained for treatment procedures. The patient understood the discussion of treatment and procedures well. All questions were answered thoroughly reviewed. Debridement of mycotic and hypertrophic toenails, 1 through 5 bilateral and clearing of subungual debris. No ulceration, no infection noted.  Return Visit-Office Procedure: Patient instructed to return to the office for a follow up visit 3 months for continued evaluation and treatment.  Nicholes Rough, DPM    Return in about 3 months (around 01/16/2023).

## 2023-01-10 ENCOUNTER — Other Ambulatory Visit: Payer: Self-pay

## 2023-01-10 ENCOUNTER — Emergency Department
Admission: EM | Admit: 2023-01-10 | Discharge: 2023-01-11 | Disposition: A | Discharge status: Home / Self Care | Payer: 59 | Source: Home / Self Care | Attending: Emergency Medicine | Admitting: Emergency Medicine

## 2023-01-10 DIAGNOSIS — D72829 Elevated white blood cell count, unspecified: Secondary | ICD-10-CM | POA: Insufficient documentation

## 2023-01-10 DIAGNOSIS — E119 Type 2 diabetes mellitus without complications: Secondary | ICD-10-CM | POA: Insufficient documentation

## 2023-01-10 DIAGNOSIS — R112 Nausea with vomiting, unspecified: Secondary | ICD-10-CM

## 2023-01-10 DIAGNOSIS — R197 Diarrhea, unspecified: Secondary | ICD-10-CM | POA: Insufficient documentation

## 2023-01-10 DIAGNOSIS — T50902A Poisoning by unspecified drugs, medicaments and biological substances, intentional self-harm, initial encounter: Secondary | ICD-10-CM | POA: Diagnosis not present

## 2023-01-10 DIAGNOSIS — F332 Major depressive disorder, recurrent severe without psychotic features: Secondary | ICD-10-CM | POA: Diagnosis not present

## 2023-01-10 NOTE — ED Provider Notes (Incomplete)
   [facility] Provider Note    Event Date/Time   First MD Initiated Contact with Patient 01/10/23 2342     (approximate)   History   No chief complaint on file.   HPI  Natalie Rose is a 42 y.o. female   Past medical history of ***    Independent Historian contributed to assessment above: ***  External Medical Documents Reviewed: ***      Physical Exam   Triage Vital Signs: ED Triage Vitals  Encounter Vitals Group     BP      Systolic BP Percentile      Diastolic BP Percentile      Pulse      Resp      Temp      Temp src      SpO2      Weight      Height      Head Circumference      Peak Flow      Pain Score      Pain Loc      Pain Education      Exclude from Growth Chart     Most recent vital signs: There were no vitals filed for this visit.  General: Awake, no distress. *** CV:  Good peripheral perfusion. *** Resp:  Normal effort. *** Abd:  No distention. *** Other:  ***   ED Results / Procedures / Treatments   Labs (all labs ordered are listed, but only abnormal results are displayed) Labs Reviewed - No data to display   I ordered and reviewed the above labs they are notable for ***  EKG  ED ECG REPORT I, Pilar Jarvis, the attending physician, personally viewed and interpreted this ECG.   Date: 01/10/2023  EKG Time: ***  Rate: ***  Rhythm: {ekg findings:315101}  Axis: ***  Intervals:{conduction defects:17367}  ST&T Change: ***    RADIOLOGY I independently reviewed and interpreted *** I also reviewed radiologist's formal read.   PROCEDURES:  Critical Care performed: {CriticalCareYesNo:19197::"Yes, see critical care procedure note(s)","No"}  Procedures   MEDICATIONS ORDERED IN ED: Medications - No data to display  External physician / consultants:  I spoke with *** regarding care plan for this patient.   IMPRESSION / MDM / ASSESSMENT AND PLAN / ED COURSE  I reviewed the triage vital  signs and the nursing notes.                                Patient's presentation is most consistent with {EM COPA:27473}  Differential diagnosis includes, but is not limited to, ***   ***The patient is on the cardiac monitor to evaluate for evidence of arrhythmia and/or significant heart rate changes.  MDM:  ***  I considered hospitalization for admission or observation ***        FINAL CLINICAL IMPRESSION(S) / ED DIAGNOSES   Final diagnoses:  None     Rx / DC Orders   ED Discharge Orders     None        Note:  This document was prepared using Dragon voice recognition software and may include unintentional dictation errors.

## 2023-01-10 NOTE — ED Provider Notes (Signed)
[facility] Provider Note    Event Date/Time   First MD Initiated Contact with Patient 01/10/23 2342     (approximate)   History   Nausea, Emesis, and Diarrhea   HPI  Natalie Rose is a 42 y.o. female   Past medical history of anxiety and depression, diabetes, cannabis use who presents to the emergency department with nausea vomiting diarrhea starting this evening.  No known sick contacts.  No fever.  Abdominal cramping pain.  No dysuria or frequency.  No other acute medical complaints.   External Medical Documents Reviewed: Behavioral health discharge summary from April 2024 for severe anxiety and panic attacks      Physical Exam   Triage Vital Signs: ED Triage Vitals  Encounter Vitals Group     BP      Systolic BP Percentile      Diastolic BP Percentile      Pulse      Resp      Temp      Temp src      SpO2      Weight      Height      Head Circumference      Peak Flow      Pain Score      Pain Loc      Pain Education      Exclude from Growth Chart     Most recent vital signs: Vitals:   01/10/23 2347 01/11/23 0300  BP: (!) 135/93 (!) 155/97  Pulse: 71 79  Resp: 20   Temp: (!) 97.5 F (36.4 C)   SpO2: 100% 100%    General: Awake, no distress.  CV:  Good peripheral perfusion.  Resp:  Normal effort.  Abd:  No distention.  Other:  Anxious appearing.  Normal vital signs.  No rigidity or guarding on abdominal exam.  Appears euvolemic.   ED Results / Procedures / Treatments   Labs (all labs ordered are listed, but only abnormal results are displayed) Labs Reviewed  COMPREHENSIVE METABOLIC PANEL - Abnormal; Notable for the following components:      Result Value   Glucose, Bld 183 (*)    Total Protein 8.4 (*)    All other components within normal limits  CBC WITH DIFFERENTIAL/PLATELET - Abnormal; Notable for the following components:   WBC 15.9 (*)    RDW 16.0 (*)    Neutro Abs 10.7 (*)    Abs Immature  Granulocytes 0.16 (*)    All other components within normal limits  URINALYSIS, ROUTINE W REFLEX MICROSCOPIC - Abnormal; Notable for the following components:   Color, Urine YELLOW (*)    APPearance HAZY (*)    All other components within normal limits  LIPASE, BLOOD  POC URINE PREG, ED     I ordered and reviewed the above labs they are notable for leukocytosis consistent with prior, chronically elevated white blood cell count   Radiology-I independently interpreted and reviewed CT of the abdomen pelvis and see no obvious obstructive or inflammatory changes.  I also reviewed final report by radiology  PROCEDURES:  Critical Care performed: No  Procedures   MEDICATIONS ORDERED IN ED: Medications  sodium chloride 0.9 % bolus 1,000 mL (1,000 mLs Intravenous New Bag/Given 01/11/23 0112)  droperidol (INAPSINE) 2.5 MG/ML injection 2.5 mg (2.5 mg Intravenous Given 01/11/23 0112)  ondansetron (ZOFRAN) injection 4 mg (4 mg Intravenous Given 01/11/23 0207)  ketorolac (TORADOL) 15 MG/ML injection 15 mg (15 mg Intravenous  Given 01/11/23 0207)  iohexol (OMNIPAQUE) 300 MG/ML solution 100 mL (100 mLs Intravenous Contrast Given 01/11/23 0240)  hydrOXYzine (ATARAX) tablet 50 mg (50 mg Oral Given 01/11/23 0301)     IMPRESSION / MDM / ASSESSMENT AND PLAN / ED COURSE  I reviewed the triage vital signs and the nursing notes.                                Patient's presentation is most consistent with acute presentation with potential threat to life or bodily function.  Differential diagnosis includes, but is not limited to, viral gastroenteritis, intra-abdominal infection like appendicitis or cholecystitis or diverticulitis, dehydration electrolyte disturbance   The patient is on the cardiac monitor to evaluate for evidence of arrhythmia and/or significant heart rate changes.  MDM:    Patient with symptoms of viral gastroenteritis.  Will treat with fluids, droperidol for antiemetic.  I considered  but doubt intra-abdominal surgical pathologies given benign abdominal exam.  No urinary symptoms to suggest UTI.  Will medicate, reassess and if improved plan for discharge.   -- Leukocytosis and now patient complaining of abdominal pain, severe.  Will get CT of the abdomen pelvis to rule out intra-abdominal surgical pathologies like appendicitis.   After medications patient is feeling better, pending final read of CT scan if unremarkable plan will be for discharge.      FINAL CLINICAL IMPRESSION(S) / ED DIAGNOSES   Final diagnoses:  Nausea vomiting and diarrhea     Rx / DC Orders   ED Discharge Orders          Ordered    ondansetron (ZOFRAN) 4 MG tablet  Daily PRN        01/11/23 0049             Note:  This document was prepared using Dragon voice recognition software and may include unintentional dictation errors.    Pilar Jarvis, MD 01/11/23 [phone]

## 2023-01-10 NOTE — ED Triage Notes (Signed)
Pt presents to the Ed via EMS c/o N/V/D. Pt states she has been having symptoms since she ate supper at approx. 1800

## 2023-01-11 ENCOUNTER — Other Ambulatory Visit: Payer: Self-pay

## 2023-01-11 ENCOUNTER — Emergency Department: Payer: 59

## 2023-01-11 ENCOUNTER — Emergency Department (EMERGENCY_DEPARTMENT_HOSPITAL)
Admission: EM | Admit: 2023-01-11 | Discharge: 2023-01-11 | Disposition: A | Discharge status: Other Acute Inpatient Hospital | Payer: 59 | Source: Home / Self Care | Attending: Emergency Medicine | Admitting: Emergency Medicine

## 2023-01-11 DIAGNOSIS — R45851 Suicidal ideations: Secondary | ICD-10-CM

## 2023-01-11 DIAGNOSIS — E119 Type 2 diabetes mellitus without complications: Secondary | ICD-10-CM | POA: Insufficient documentation

## 2023-01-11 DIAGNOSIS — T50902A Poisoning by unspecified drugs, medicaments and biological substances, intentional self-harm, initial encounter: Secondary | ICD-10-CM | POA: Diagnosis not present

## 2023-01-11 DIAGNOSIS — T43292A Poisoning by other antidepressants, intentional self-harm, initial encounter: Secondary | ICD-10-CM | POA: Insufficient documentation

## 2023-01-11 DIAGNOSIS — F17210 Nicotine dependence, cigarettes, uncomplicated: Secondary | ICD-10-CM | POA: Insufficient documentation

## 2023-01-11 LAB — URINE DRUG SCREEN, QUALITATIVE (ARMC ONLY)
Amphetamines, Ur Screen: NOT DETECTED
Barbiturates, Ur Screen: NOT DETECTED
Benzodiazepine, Ur Scrn: NOT DETECTED
Cannabinoid 50 Ng, Ur ~~LOC~~: POSITIVE — AB
Cocaine Metabolite,Ur ~~LOC~~: NOT DETECTED
MDMA (Ecstasy)Ur Screen: NOT DETECTED
Methadone Scn, Ur: NOT DETECTED
Opiate, Ur Screen: NOT DETECTED
Phencyclidine (PCP) Ur S: NOT DETECTED
Tricyclic, Ur Screen: NOT DETECTED

## 2023-01-11 LAB — COMPREHENSIVE METABOLIC PANEL
ALT: 30 U/L (ref 0–44)
ALT: 29 U/L (ref 0–44)
AST: 27 U/L (ref 15–41)
AST: 31 U/L (ref 15–41)
Albumin: 4.5 g/dL (ref 3.5–5.0)
Albumin: 4.5 g/dL (ref 3.5–5.0)
Alkaline Phosphatase: 66 U/L (ref 38–126)
Alkaline Phosphatase: 63 U/L (ref 38–126)
Anion gap: 12 (ref 5–15)
Anion gap: 12 (ref 5–15)
BUN: 15 mg/dL (ref 6–20)
BUN: 11 mg/dL (ref 6–20)
CO2: 25 mmol/L (ref 22–32)
CO2: 24 mmol/L (ref 22–32)
Calcium: 9.5 mg/dL (ref 8.9–10.3)
Calcium: 9.1 mg/dL (ref 8.9–10.3)
Chloride: 105 mmol/L (ref 98–111)
Chloride: 103 mmol/L (ref 98–111)
Creatinine, Ser: 0.79 mg/dL (ref 0.44–1.00)
Creatinine, Ser: 0.78 mg/dL (ref 0.44–1.00)
GFR, Estimated: >60 mL/min (ref >60)
GFR, Estimated: >60 mL/min (ref >60)
Glucose, Bld: 183 mg/dL — ABNORMAL HIGH (ref 70–99)
Glucose, Bld: 179 mg/dL — ABNORMAL HIGH (ref 70–99)
Potassium: 3.9 mmol/L (ref 3.5–5.1)
Potassium: 4.1 mmol/L (ref 3.5–5.1)
Sodium: 142 mmol/L (ref 135–145)
Sodium: 139 mmol/L (ref 135–145)
Total Bilirubin: 0.8 mg/dL (ref 0.3–1.2)
Total Bilirubin: 0.6 mg/dL (ref 0.3–1.2)
Total Protein: 8.4 g/dL — ABNORMAL HIGH (ref 6.5–8.1)
Total Protein: 8.2 g/dL — ABNORMAL HIGH (ref 6.5–8.1)

## 2023-01-11 LAB — CBC WITH DIFFERENTIAL/PLATELET
Abs Immature Granulocytes: 0.16 10*3/uL — ABNORMAL HIGH (ref 0.00–0.07)
Abs Immature Granulocytes: 0.25 10*3/uL — ABNORMAL HIGH (ref 0.00–0.07)
Basophils Absolute: 0.1 10*3/uL (ref 0.0–0.1)
Basophils Absolute: 0.1 10*3/uL (ref 0.0–0.1)
Basophils Relative: 0 %
Basophils Relative: 0 %
Eosinophils Absolute: 0.3 10*3/uL (ref 0.0–0.5)
Eosinophils Absolute: 0 10*3/uL (ref 0.0–0.5)
Eosinophils Relative: 2 %
Eosinophils Relative: 0 %
HCT: 40.2 % (ref 36.0–46.0)
HCT: 37.8 % (ref 36.0–46.0)
Hemoglobin: 13.2 g/dL (ref 12.0–15.0)
Hemoglobin: 12.4 g/dL (ref 12.0–15.0)
Immature Granulocytes: 1 %
Immature Granulocytes: 2 %
Lymphocytes Relative: 24 %
Lymphocytes Relative: 15 %
Lymphs Abs: 3.9 10*3/uL (ref 0.7–4.0)
Lymphs Abs: 2.1 10*3/uL (ref 0.7–4.0)
MCH: 28.1 pg (ref 26.0–34.0)
MCH: 27.7 pg (ref 26.0–34.0)
MCHC: 32.8 g/dL (ref 30.0–36.0)
MCHC: 32.8 g/dL (ref 30.0–36.0)
MCV: 85.5 fL (ref 80.0–100.0)
MCV: 84.6 fL (ref 80.0–100.0)
Monocytes Absolute: 0.8 10*3/uL (ref 0.1–1.0)
Monocytes Absolute: 0.5 10*3/uL (ref 0.1–1.0)
Monocytes Relative: 5 %
Monocytes Relative: 4 %
Neutro Abs: 10.7 10*3/uL — ABNORMAL HIGH (ref 1.7–7.7)
Neutro Abs: 11.2 10*3/uL — ABNORMAL HIGH (ref 1.7–7.7)
Neutrophils Relative %: 68 %
Neutrophils Relative %: 79 %
Platelets: 381 10*3/uL (ref 150–400)
Platelets: 374 10*3/uL (ref 150–400)
RBC: 4.7 MIL/uL (ref 3.87–5.11)
RBC: 4.47 MIL/uL (ref 3.87–5.11)
RDW: 16 % — ABNORMAL HIGH (ref 11.5–15.5)
RDW: 16.2 % — ABNORMAL HIGH (ref 11.5–15.5)
WBC: 15.9 10*3/uL — ABNORMAL HIGH (ref 4.0–10.5)
WBC: 14.2 10*3/uL — ABNORMAL HIGH (ref 4.0–10.5)
nRBC: 0 % (ref 0.0–0.2)
nRBC: 0 % (ref 0.0–0.2)

## 2023-01-11 LAB — URINALYSIS, ROUTINE W REFLEX MICROSCOPIC
Bacteria, UA: NONE SEEN
Bilirubin Urine: NEGATIVE
Glucose, UA: NEGATIVE mg/dL
Hgb urine dipstick: NEGATIVE
Ketones, ur: NEGATIVE mg/dL
Leukocytes,Ua: NEGATIVE
Nitrite: NEGATIVE
Protein, ur: NEGATIVE mg/dL
Specific Gravity, Urine: 1.017 (ref 1.005–1.030)
pH: 6 (ref 5.0–8.0)

## 2023-01-11 LAB — LIPASE, BLOOD: Lipase: 41 U/L (ref 11–51)

## 2023-01-11 LAB — MAGNESIUM: Magnesium: 2.1 mg/dL (ref 1.7–2.4)

## 2023-01-11 LAB — ACETAMINOPHEN LEVEL
Acetaminophen (Tylenol), Serum: <10 ug/mL — ABNORMAL LOW (ref 10–30)
Acetaminophen (Tylenol), Serum: <10 ug/mL — ABNORMAL LOW (ref 10–30)

## 2023-01-11 LAB — ETHANOL: Alcohol, Ethyl (B): <10 mg/dL (ref <10)

## 2023-01-11 LAB — SALICYLATE LEVEL: Salicylate Lvl: <7 mg/dL — ABNORMAL LOW (ref 7.0–30.0)

## 2023-01-11 LAB — POC URINE PREG, ED: Preg Test, Ur: NEGATIVE

## 2023-01-11 MED ORDER — HYDROXYZINE HCL 25 MG PO TABS
25.0000 mg | ORAL_TABLET | Freq: Once | ORAL | Status: AC
  Administered 2023-01-11: 25 mg via ORAL
  Filled 2023-01-11: qty 1

## 2023-01-11 MED ORDER — ONDANSETRON HCL 4 MG PO TABS: 4.0000 mg | ORAL_TABLET | Freq: Every day | ORAL | 1 refills | Status: DC | PRN

## 2023-01-11 MED ORDER — LORAZEPAM 2 MG/ML IJ SOLN
1.0000 mg | Freq: Once | INTRAMUSCULAR | Status: AC
  Administered 2023-01-11: 1 mg via INTRAVENOUS
  Filled 2023-01-11: qty 1

## 2023-01-11 MED ORDER — ONDANSETRON HCL 4 MG/2ML IJ SOLN
4.0000 mg | Freq: Once | INTRAMUSCULAR | Status: AC
  Administered 2023-01-11: 4 mg via INTRAVENOUS
  Filled 2023-01-11: qty 2

## 2023-01-11 MED ORDER — KETOROLAC TROMETHAMINE 15 MG/ML IJ SOLN
15.0000 mg | Freq: Once | INTRAMUSCULAR | Status: AC
  Administered 2023-01-11: 15 mg via INTRAVENOUS
  Filled 2023-01-11: qty 1

## 2023-01-11 MED ORDER — SODIUM CHLORIDE 0.9 % IV BOLUS
1000.0000 mL | Freq: Once | INTRAVENOUS | Status: AC
  Administered 2023-01-11: 1000 mL via INTRAVENOUS

## 2023-01-11 MED ORDER — IOHEXOL 300 MG/ML  SOLN
100.0000 mL | Freq: Once | INTRAMUSCULAR | Status: AC | PRN
  Administered 2023-01-11: 100 mL via INTRAVENOUS

## 2023-01-11 MED ORDER — MELATONIN 3 MG PO TABS: 3.0000 mg | ORAL_TABLET | Freq: Every day | ORAL | Status: DC

## 2023-01-11 MED ORDER — DROPERIDOL 2.5 MG/ML IJ SOLN
2.5000 mg | Freq: Once | INTRAMUSCULAR | Status: AC
  Administered 2023-01-11: 2.5 mg via INTRAVENOUS
  Filled 2023-01-11: qty 2

## 2023-01-11 MED ORDER — ALUM & MAG HYDROXIDE-SIMETH 200-200-20 MG/5ML PO SUSP
30.0000 mL | Freq: Four times a day (QID) | ORAL | Status: DC | PRN
  Administered 2023-01-11: 30 mL via ORAL
  Filled 2023-01-11: qty 30

## 2023-01-11 MED ORDER — ACETAMINOPHEN 325 MG PO TABS
650.0000 mg | ORAL_TABLET | Freq: Once | ORAL | Status: AC
  Administered 2023-01-11: 650 mg via ORAL
  Filled 2023-01-11: qty 2

## 2023-01-11 MED ORDER — CHARCOAL ACTIVATED PO LIQD
75.0000 g | Freq: Once | ORAL | Status: AC
  Administered 2023-01-11: 75 g via ORAL
  Filled 2023-01-11: qty 480

## 2023-01-11 MED ORDER — LORAZEPAM 1 MG PO TABS
1.0000 mg | ORAL_TABLET | Freq: Three times a day (TID) | ORAL | Status: DC | PRN
  Administered 2023-01-11: 1 mg via SUBLINGUAL
  Filled 2023-01-11: qty 1

## 2023-01-11 MED ORDER — HYDROXYZINE HCL 25 MG PO TABS
50.0000 mg | ORAL_TABLET | Freq: Once | ORAL | Status: AC
  Administered 2023-01-11: 50 mg via ORAL
  Filled 2023-01-11: qty 2

## 2023-01-11 MED ORDER — MELATONIN 5 MG PO TABS
5.0000 mg | ORAL_TABLET | Freq: Every day | ORAL | Status: DC
  Administered 2023-01-11: 5 mg via ORAL
  Filled 2023-01-11: qty 1

## 2023-01-11 NOTE — ED Triage Notes (Signed)
Pt from home via EMS for overdose of 15x 100 mg trazodone and 9x 15 mg Seroquel. Reported it was for SI to EMS but denied it to PD. Most recently was telling EMS staff it was for anxiety driven purposes. Pt has been A&Ox4 for EMS, only complaint has been nausea.  EMS vitals: BP 141/78 HR 91 98% RA CBG 175

## 2023-01-11 NOTE — Consult Note (Addendum)
[facility] Face-to-Face Psychiatry Consult   Reason for Consult:  Intentional overdose Referring Physician:  Shaune Pollack MD Patient Identification: Natalie Rose MRN:  [id number] Principal Diagnosis: <principal problem not specified> Diagnosis:  Active Problems:   Intentional drug overdose (HCC)   Total Time spent with patient: 30 minutes  Subjective:   Natalie Rose is a 42 y.o. female patient admitted due to intentional overdose. "I wanted my anxiety to stop:".  HPI:  Natalie Rose is a 42 yo female presenting to the ED due to intentional overdose. Patient reports having panic attacks and anxiety, stating that why I took a bunch of pills". Patient experiencing nausea during the visit. Patient confirmed that she knew there was a possibility of death due to overdose.  Patient has a psychiatric history of borderline personality disorder, PTSD, self inflicted laceration of wrist, MDD, Cannabis abuse, panic attack, OCD and previous overdose x 2 (7 years ago and 2 years ago). Patient also has a history of multiple psychiatric hospitalizations. She denies SI/HIAVH/delusional thought/paranoia.  Past Psychiatric History: see above  Risk to Self:  intentional overdose Risk to Others:  denies Prior Inpatient Therapy:  Yes Prior Outpatient Therapy:  Yes  Past Medical History:  Past Medical History:  Diagnosis Date   Anxiety    Arthritis    Depression    Diabetes mellitus without complication (HCC)    Mental health disorder    Migraines     Past Surgical History:  Procedure Laterality Date   MOUTH SURGERY     Family History:  Family History  Problem Relation Age of Onset   Osteopenia Mother    Heart failure Mother    Osteoarthritis Mother    Depression Mother    Diabetes Father    Family Psychiatric  History: mother:depression Social History:  Social History   Substance and Sexual Activity  Alcohol Use Not Currently     Social History   Substance  and Sexual Activity  Drug Use Yes   Frequency: 3.0 times per week   Types: Marijuana    Social History   Socioeconomic History   Marital status: Single    Spouse name: Not on file   Number of children: Not on file   Years of education: Not on file   Highest education level: Not on file  Occupational History   Not on file  Tobacco Use   Smoking status: Every Day    Current packs/day: 1.00    Types: Cigarettes   Smokeless tobacco: Never  Vaping Use   Vaping status: Never Used  Substance and Sexual Activity   Alcohol use: Not Currently   Drug use: Yes    Frequency: 3.0 times per week    Types: Marijuana   Sexual activity: Not Currently    Birth control/protection: None  Other Topics Concern   Not on file  Social History Narrative   Not on file   Social Determinants of Health   Financial Resource Strain: Medium Risk (03/08/2020)   Received from [facility], [facility]   Overall Financial Resource Strain (CARDIA)    Difficulty of Paying Living Expenses: Somewhat hard  Food Insecurity: No Food Insecurity (09/15/2022)   Hunger Vital Sign    Worried About Running Out of Food in the Last Year: Never true    Ran Out of Food in the Last Year: Never true  Recent Concern: Food Insecurity - Food Insecurity Present (06/24/2022)   Hunger Vital Sign    Worried About Running  Out of Food in the Last Year: Sometimes true    Ran Out of Food in the Last Year: Often true  Transportation Needs: No Transportation Needs (09/15/2022)   PRAPARE - Administrator, Civil Service (Medical): No    Lack of Transportation (Non-Medical): No  Recent Concern: Transportation Needs - Unmet Transportation Needs (06/24/2022)   PRAPARE - Transportation    Lack of Transportation (Medical): Yes    Lack of Transportation (Non-Medical): Yes  Physical Activity: Insufficiently Active (05/02/2021)   Received from [facility]   Exercise Vital Sign    Days of Exercise per  Week: 4 days    Minutes of Exercise per Session: 30 min  Stress: Not on file  Social Connections: Not on file   Additional Social History:    Allergies:   Allergies  Allergen Reactions   Diazepam Hives, Anxiety, Itching, Other (See Comments) and Rash    unknown unknown    Lurasidone     Labs:  Results for orders placed or performed during the hospital encounter of 01/11/23 (from the past 48 hour(s))  CBC with Differential     Status: Abnormal   Collection Time: 01/11/23  8:13 AM  Result Value Ref Range   WBC 14.2 (H) 4.0 - 10.5 K/uL   RBC 4.47 3.87 - 5.11 MIL/uL   Hemoglobin 12.4 12.0 - 15.0 g/dL   HCT 16.1 09.6 - 04.5 %   MCV 84.6 80.0 - 100.0 fL   MCH 27.7 26.0 - 34.0 pg   MCHC 32.8 30.0 - 36.0 g/dL   RDW 40.9 (H) 81.1 - 91.4 %   Platelets 374 150 - 400 K/uL   nRBC 0.0 0.0 - 0.2 %   Neutrophils Relative % 79 %   Neutro Abs 11.2 (H) 1.7 - 7.7 K/uL   Lymphocytes Relative 15 %   Lymphs Abs 2.1 0.7 - 4.0 K/uL   Monocytes Relative 4 %   Monocytes Absolute 0.5 0.1 - 1.0 K/uL   Eosinophils Relative 0 %   Eosinophils Absolute 0.0 0.0 - 0.5 K/uL   Basophils Relative 0 %   Basophils Absolute 0.1 0.0 - 0.1 K/uL   Immature Granulocytes 2 %   Abs Immature Granulocytes 0.25 (H) 0.00 - 0.07 K/uL    Comment: Performed at [facility], [address] [place], [place] [address]  Comprehensive metabolic panel     Status: Abnormal   Collection Time: 01/11/23  8:13 AM  Result Value Ref Range   Sodium 139 135 - 145 mmol/L   Potassium 4.1 3.5 - 5.1 mmol/L   Chloride 103 98 - 111 mmol/L   CO2 24 22 - 32 mmol/L   Glucose, Bld 179 (H) 70 - 99 mg/dL    Comment: Glucose reference range applies only to samples taken after fasting for at least 8 hours.   BUN 11 6 - 20 mg/dL   Creatinine, Ser 6.21 0.44 - 1.00 mg/dL   Calcium 9.1 8.9 - 30.8 mg/dL   Total Protein 8.2 (H) 6.5 - 8.1 g/dL   Albumin 4.5 3.5 - 5.0 g/dL   AST 31 15 - 41 U/L   ALT 29 0 - 44 U/L   Alkaline  Phosphatase 63 38 - 126 U/L   Total Bilirubin 0.6 0.3 - 1.2 mg/dL   GFR, Estimated >65 >78 mL/min    Comment: (NOTE) Calculated using the CKD-EPI Creatinine Equation (2021)    Anion gap 12 5 - 15  Comment: Performed at [facility], [address] [place], [place] [address]  Magnesium     Status: None   Collection Time: 01/11/23  8:13 AM  Result Value Ref Range   Magnesium 2.1 1.7 - 2.4 mg/dL    Comment: Performed at [facility], [address] [place], [place] [address]  Acetaminophen level     Status: Abnormal   Collection Time: 01/11/23  8:13 AM  Result Value Ref Range   Acetaminophen (Tylenol), Serum <10 (L) 10 - 30 ug/mL    Comment: (NOTE) Therapeutic concentrations vary significantly. A range of 10-30 ug/mL  may be an effective concentration for many patients. However, some  are best treated at concentrations outside of this range. Acetaminophen concentrations >150 ug/mL at 4 hours after ingestion  and >50 ug/mL at 12 hours after ingestion are often associated with  toxic reactions.  Performed at [facility], [address] [place], [place] [address]   Salicylate level     Status: Abnormal   Collection Time: 01/11/23  8:13 AM  Result Value Ref Range   Salicylate Lvl <7.0 (L) 7.0 - 30.0 mg/dL    Comment: Performed at [facility], [address] [place], [place] [address]  Ethanol     Status: None   Collection Time: 01/11/23  8:13 AM  Result Value Ref Range   Alcohol, Ethyl (B) <10 <10 mg/dL    Comment: (NOTE) Lowest detectable limit for serum alcohol is 10 mg/dL.  For medical purposes only. Performed at [facility], [address] [place], [place] [address]   Urine Drug Screen, Qualitative [facility] only)     Status: Abnormal   Collection Time: 01/11/23 10:34 AM  Result Value Ref Range   Tricyclic, Ur Screen NONE DETECTED NONE DETECTED   Amphetamines, Ur Screen NONE DETECTED NONE DETECTED   MDMA (Ecstasy)Ur  Screen NONE DETECTED NONE DETECTED   Cocaine Metabolite,Ur [place] NONE DETECTED NONE DETECTED   Opiate, Ur Screen NONE DETECTED NONE DETECTED   Phencyclidine (PCP) Ur S NONE DETECTED NONE DETECTED   Cannabinoid 50 Ng, Ur [place] POSITIVE (A) NONE DETECTED   Barbiturates, Ur Screen NONE DETECTED NONE DETECTED   Benzodiazepine, Ur Scrn NONE DETECTED NONE DETECTED   Methadone Scn, Ur NONE DETECTED NONE DETECTED    Comment: (NOTE) Tricyclics + metabolites, urine    Cutoff 1000 ng/mL Amphetamines + metabolites, urine  Cutoff 1000 ng/mL MDMA (Ecstasy), urine              Cutoff 500 ng/mL Cocaine Metabolite, urine          Cutoff 300 ng/mL Opiate + metabolites, urine        Cutoff 300 ng/mL Phencyclidine (PCP), urine         Cutoff 25 ng/mL Cannabinoid, urine                 Cutoff 50 ng/mL Barbiturates + metabolites, urine  Cutoff 200 ng/mL Benzodiazepine, urine              Cutoff 200 ng/mL Methadone, urine                   Cutoff 300 ng/mL  The urine drug screen provides only a preliminary, unconfirmed analytical test result and should not be used for non-medical purposes. Clinical consideration and professional judgment should be applied to any positive drug screen result due to possible interfering substances. A more specific alternate chemical method must be used in order to  obtain a confirmed analytical result. Gas chromatography / mass spectrometry (GC/MS) is the preferred confirm atory method. Performed at [facility], [address] [place], [place] [address]     Current Facility-Administered Medications  Medication Dose Route Frequency Provider Last Rate Last Admin   alum & mag hydroxide-simeth (MAALOX/MYLANTA) 200-200-20 MG/5ML suspension 30 mL  30 mL Oral Q6H PRN Shaune Pollack, MD       LORazepam (ATIVAN) tablet 1 mg  1 mg Sublingual Q8H PRN Shaune Pollack, MD       sodium chloride 0.9 % bolus 1,000 mL  1,000 mL Intravenous Once Shaune Pollack, MD       Current  Outpatient Medications  Medication Sig Dispense Refill   LORazepam (ATIVAN) 1 MG tablet Take 1 mg by mouth daily as needed.     LYBALVI 10-10 MG TABS Take 1 tablet by mouth daily.     prazosin (MINIPRESS) 5 MG capsule Take 5 mg by mouth at bedtime.     sertraline (ZOLOFT) 100 MG tablet Take 100 mg by mouth daily.     atenolol (TENORMIN) 50 MG tablet Take 1 tablet (50 mg total) by mouth daily. 30 tablet 1   clonazePAM (KLONOPIN) 0.5 MG tablet Take 1 tablet (0.5 mg total) by mouth 2 (two) times daily. 60 tablet 1   clotrimazole-betamethasone (LOTRISONE) cream Apply 1 Application topically 2 (two) times daily. 30 g 0   gabapentin (NEURONTIN) 300 MG capsule Take 1 capsule (300 mg total) by mouth 3 (three) times daily. 90 capsule 1   hydrOXYzine (ATARAX) 50 MG tablet Take 1 tablet (50 mg total) by mouth 3 (three) times daily as needed. 60 tablet 1   metFORMIN (GLUCOPHAGE) 500 MG tablet Take 1 tablet (500 mg total) by mouth 2 (two) times daily with a meal. 60 tablet 1   mirtazapine (REMERON) 45 MG tablet Take 1 tablet (45 mg total) by mouth at bedtime. 30 tablet 1   nicotine (NICODERM CQ - DOSED IN MG/24 HOURS) 14 mg/24hr patch Place 1 patch (14 mg total) onto the skin daily. 28 patch 1   OLANZapine (ZYPREXA) 10 MG tablet Take 1 tablet (10 mg total) by mouth at bedtime. 30 tablet 1   ondansetron (ZOFRAN) 4 MG tablet Take 1 tablet (4 mg total) by mouth daily as needed for nausea or vomiting. 30 tablet 1   prazosin (MINIPRESS) 2 MG capsule Take 2 capsules (4 mg total) by mouth at bedtime. 60 capsule 1   promethazine (PHENERGAN) 25 MG tablet Take 1 tablet (25 mg total) by mouth every 6 (six) hours as needed for nausea or vomiting. 30 tablet 1   propranolol (INDERAL) 10 MG tablet Take 1 tablet (10 mg total) by mouth 3 (three) times daily. 90 tablet 1   QUEtiapine (SEROQUEL) 50 MG tablet Take 1 tablet (50 mg total) by mouth 2 (two) times daily. 60 tablet 1   sertraline (ZOLOFT) 50 MG tablet Take 3 tablets  (150 mg total) by mouth daily. 90 tablet 1   traZODone (DESYREL) 100 MG tablet Take 1 tablet (100 mg total) by mouth at bedtime. 30 tablet 1    Musculoskeletal: Strength & Muscle Tone:  n/a Gait & Station:  n/a Patient leans: N/A            Psychiatric Specialty Exam:  Presentation  General Appearance:  Appropriate for Environment  Eye Contact: Good  Speech: Clear and Coherent  Speech Volume: Normal  Handedness:No data recorded  Mood and Affect  Mood:  Depressed  Affect: Congruent   Thought Process  Thought Processes: Coherent  Descriptions of Associations:Intact  Orientation:Full (Time, Place and Person)  Thought Content:Illogical  History of Schizophrenia/Schizoaffective disorder:No  Duration of Psychotic Symptoms:No data recorded Hallucinations:Hallucinations: None  Ideas of Reference:None  Suicidal Thoughts:Suicidal Thoughts: Yes, Passive SI Passive Intent and/or Plan: With Intent; With Plan; With Means to Carry Out  Homicidal Thoughts:Homicidal Thoughts: No   Sensorium  Memory: Immediate Good; Recent Good; Remote Good  Judgment: Poor  Insight: Good   Executive Functions  Concentration: Good  Attention Span: Good  Recall: Good  Fund of Knowledge: Good  Language: Good   Psychomotor Activity  Psychomotor Activity:Psychomotor Activity: Normal   Assets  Assets: Communication Skills   Sleep  Sleep:No data recorded  Physical Exam: Physical Exam Neurological:     Mental Status: She is alert and oriented to person, place, and time.    Review of Systems  Psychiatric/Behavioral:  Positive for depression. Negative for hallucinations and memory loss. The patient is nervous/anxious.   All other systems reviewed and are negative.  Blood pressure 136/83, pulse 73, temperature 98.4 F (36.9 C), temperature source Oral, resp. rate (!) 21, height 4\' 11"  (1.499 m), weight 75 kg, last menstrual period 01/02/2023,  SpO2 96%. Body mass index is 33.4 kg/m.  Treatment Plan Summary: Daily contact with patient to assess and evaluate symptoms and progress in treatment  Disposition: Recommend psychiatric Inpatient admission when medically cleared.  Mcneil Sober, NP 01/11/2023 4:13 PM

## 2023-01-11 NOTE — ED Notes (Signed)
Pt drank about 100 mL of charcoal

## 2023-01-11 NOTE — ED Notes (Signed)
Amil Amen from [employer] called concerning pt. Stated if pt had no airway concern to administer activated charcoal 1 gram per kg. Recommended to check acetaminophen level, EKG, monitor for QT prolongation, CNS depression, seizures, hypotension, bradycardia, tachycardia. Stated to monitor pt for eight hours from time of ingestion.

## 2023-01-11 NOTE — ED Provider Notes (Signed)
[facility] Provider Note    Event Date/Time   First MD Initiated Contact with Patient 01/11/23 [id number]     (approximate)   History   Drug Overdose   HPI  Natalie Rose is a 42 y.o. female with history of borderline personality, depression, here with reported suicidal ideation.  The patient states that she has been increasingly anxious.  She was actually seen yesterday for nausea and vomiting.  She states she was starting get anxious at the time of discharge.  Since then, she states that she wanted to "end the anxiety" and so she took fifteen 100 mg trazodone's and 9 15 mg Seroquel's.  The patient states that she just wanted things to stop.  She does state that she knew that this could kill her.  She has a history of similar episodes and has been admitted to the psychiatric ward in the past.  No complaints other than feeling tired at this time.  No nausea or vomiting currently.     Physical Exam   Triage Vital Signs: ED Triage Vitals [01/11/23 0812]  Encounter Vitals Group     BP      Systolic BP Percentile      Diastolic BP Percentile      Pulse      Resp      Temp 98.6 F (37 C)     Temp Source Oral     SpO2      Weight      Height      Head Circumference      Peak Flow      Pain Score      Pain Loc      Pain Education      Exclude from Growth Chart     Most recent vital signs: Vitals:   01/11/23 0830 01/11/23 0900  BP: 131/82 (!) 151/87  Pulse: 76 67  Resp: (!) 32 (!) 29  Temp:    SpO2: 97% 98%     General: Awake, no distress.  CV:  Good peripheral perfusion.  Resp:  Normal work of breathing.  Abd:  No distention.  No tenderness. Other:  Awake, alert.  Normal tone throughout.  Normal reflexes.  Does endorse depression and anxiety.  Denies active SI but does state that she did this knowing that this could kill her.   ED Results / Procedures / Treatments   Labs (all labs ordered are listed, but only abnormal results  are displayed) Labs Reviewed  CBC WITH DIFFERENTIAL/PLATELET - Abnormal; Notable for the following components:      Result Value   WBC 14.2 (*)    RDW 16.2 (*)    Neutro Abs 11.2 (*)    Abs Immature Granulocytes 0.25 (*)    All other components within normal limits  COMPREHENSIVE METABOLIC PANEL - Abnormal; Notable for the following components:   Glucose, Bld 179 (*)    Total Protein 8.2 (*)    All other components within normal limits  ACETAMINOPHEN LEVEL - Abnormal; Notable for the following components:   Acetaminophen (Tylenol), Serum <10 (*)    All other components within normal limits  SALICYLATE LEVEL - Abnormal; Notable for the following components:   Salicylate Lvl <7.0 (*)    All other components within normal limits  MAGNESIUM  ETHANOL  URINE DRUG SCREEN, QUALITATIVE ([facility] ONLY)  ACETAMINOPHEN LEVEL     EKG Normal sinus rhythm, Triklo 83.  PR 138, QRS 94, QTc 473.  Acute  ST elevation or depression.  Acute evidence of acute ischemia or infarct.   RADIOLOGY    I also independently reviewed and agree with radiologist interpretations.   PROCEDURES:  Critical Care performed: No  .1-3 Lead EKG Interpretation  Performed by: Shaune Pollack, MD Authorized by: Shaune Pollack, MD     Interpretation: normal     ECG rate:  80-100   ECG rate assessment: normal     Rhythm: sinus rhythm     Ectopy: none     Conduction: normal   Comments:     Indication: Overdose     MEDICATIONS ORDERED IN ED: Medications  charcoal activated (NO SORBITOL) (ACTIDOSE-AQUA) suspension 75 g (75 g Oral Given 01/11/23 0837)     IMPRESSION / MDM / ASSESSMENT AND PLAN / ED COURSE  I reviewed the triage vital signs and the nursing notes.                              Differential diagnosis includes, but is not limited to, intentional overdose, suicidal attempt, polypharmacy, polysubstance abuse, serotonin syndrome, prolonged QT from overdose  Patient's presentation is most  consistent with acute presentation with potential threat to life or bodily function.  The patient is on the cardiac monitor to evaluate for evidence of arrhythmia and/or significant heart rate changes   42 year old female here with intentional overdose on multiple medications.  While she states that this was not an overt suicide attempt, she does state that she did this knowing that this could kill her and she has a history of previous overdose in the past.  Will place IVC and consult psychiatry.  Discussed case with poison control, will monitor for 8 hours after ingestion until medically clear.  QT slightly prolonged but patient is hemodynamically stable.  Electrolytes are overall within normal limits.  Initial tox labs negative.  Suicide attempt   FINAL CLINICAL IMPRESSION(S) / ED DIAGNOSES   Final diagnoses:  Suicidal ideation  Intentional drug overdose, initial encounter [facility])     Rx / DC Orders   ED Discharge Orders     None        Note:  This document was prepared using Dragon voice recognition software and may include unintentional dictation errors.   Shaune Pollack, MD 01/11/23 [phone]

## 2023-01-11 NOTE — ED Notes (Signed)
INVOLUNTARY PENDING BMU ADMIT LATER TONIGHT WHEN CLEARED

## 2023-01-11 NOTE — ED Notes (Signed)
Pt given dinner tray.

## 2023-01-11 NOTE — ED Notes (Signed)
Pt dressed out by this RN and NT. Two bags of pt belongings: Insurance claims handler Two necklaces and one ring Socks and shoes Underwear Food [employer] full of belongings Placed in [place] locker

## 2023-01-11 NOTE — ED Notes (Signed)
Amil Amen from [employer] called for update. She stated to call back with any changes and they will call night shift for another update.

## 2023-01-11 NOTE — ED Notes (Signed)
Pt has thrown up twice in last hour. MD notified.

## 2023-01-11 NOTE — BH Assessment (Signed)
Patient is to be admitted to [facility] by  Cranston Neighbor, NP .  Attending Physician will be Dr. Marlou Porch.   Patient has been assigned to room 309, by [facility] Charge Nurse [place].   Intake Paper Work has been signed and placed on patient chart.  ER staff is aware of the admission: Melody, ER Secretary   Dr. Derrill Kay, ER MD  Morrie Sheldon, Patient's Nurse  Aggie Cosier, Patient Access.   Pt can arrive to [facility] BMU after 0000 01/12/23.

## 2023-01-11 NOTE — ED Notes (Signed)
Pt had incontinence episode when vomiting. This RN assisted pt to bathroom and into new pants and underwear. Sheet changed. MD notified of pt's continued N/V

## 2023-01-11 NOTE — ED Notes (Signed)
Poison control cleared pt at this time.

## 2023-01-11 NOTE — Discharge Instructions (Signed)
Take Zofran as needed for nausea and vomiting.Drink plenty of fluids to stay well-hydrated.  Find Pedialyte or similar electrolyte rehydration formulas at your local pharmacy.

## 2023-01-11 NOTE — ED Notes (Signed)
INVOLUNTARY with all papers on chart/awaiting TTS/PSYCH consult ?

## 2023-01-11 NOTE — BH Assessment (Signed)
Comprehensive Clinical Assessment (CCA) Note  01/11/2023 Natalie Rose [id number]  Chief Complaint:  Chief Complaint  Patient presents with   Drug Overdose   Visit Diagnosis: Major Depression   Natalie Rose is a 42 year old female who presents to the ER due to ingesting more than the prescribe about of medications. She states she took the medications for "panic." She was feeling anxious for the last two days and was unable to manage it. She felt overwhelmed and took the medications with the intentions of feeling better. However, it caused her to have stomach pain. Thus, coming to the ER. Patient has been in the ER for similar presentation, of taking more than the prescribed amount of medications to address symptoms of her mental illness. She has poor insight about what she has done and the effects of it.   During the interview, the patient was calm, cooperative and polite. She was able to provide appropriate answers to the questions. Throughout the interview, she denied AV/H and HI. She admits to cannabis use and denies the use of any other mind-altering substances.  CCA Screening, Triage and Referral (STR)  Patient Reported Information How did you hear about [place]? Self  What Is the Reason for Your Visit/Call Today? Took more than the prescribe amount of medication to address "panic."  How Long Has This Been Causing You Problems? <Week  What Do You Feel Would Help You the Most Today? Treatment for Depression or other mood problem   Have You Recently Had Any Thoughts About Hurting Yourself? No  Are You Planning to Commit Suicide/Harm Yourself At This time? No   Flowsheet Row ED from 01/11/2023 in [facility] Department at [facility] ED from 01[facility]10/2023 in [facility] Emergency Department at [facility] Admission (Discharged) from 09/15/2022 in [facility] INPATIENT BEHAVIORAL MEDICINE  C-SSRS RISK CATEGORY No Risk No Risk High Risk       Have you Recently  Had Thoughts About Hurting Someone Natalie Rose? No  Are You Planning to Harm Someone at This Time? No  Explanation: No data recorded  Have You Used Any Alcohol or Drugs in the Past 24 Hours? Yes  What Did You Use and How Much? Cannabis   Do You Currently Have a Therapist/Psychiatrist? Yes  Name of Therapist/Psychiatrist: Name of Therapist/Psychiatrist: [facility]   Have You Been Recently Discharged From Any Office Practice or Programs? No  Explanation of Discharge From Practice/Program: No data recorded    CCA Screening Triage Referral Assessment Type of Contact: Face-to-Face  Telemedicine Service Delivery:   Is this Initial or Reassessment?   Date Telepsych consult ordered in [facility]:    Time Telepsych consult ordered in [facility]:    Location of Assessment: [facility] ED  Provider Location: [facility] ED   Collateral Involvement: [facility], outpatient provider.   Does Patient Have a Automotive engineer Guardian? No  Legal Guardian Contact Information: No data recorded Copy of Legal Guardianship Form: No data recorded Legal Guardian Notified of Arrival: No data recorded Legal Guardian Notified of Pending Discharge: No data recorded If Minor and Not Living with Parent(s), Who has Custody? No data recorded Is CPS involved or ever been involved? Never  Is APS involved or ever been involved? Never   Patient Determined To Be At Risk for Harm To Self or Others Based on Review of Patient Reported Information or Presenting Complaint? No  Method: No Plan  Availability of Means: No data recorded Intent: No data recorded Notification Required: No data recorded Additional  Information for Danger to Others Potential: No data recorded Additional Comments for Danger to Others Potential: No data recorded Are There Guns or Other Weapons in Your Home? No  Types of Guns/Weapons: No data recorded Are These Weapons Safely Secured?                            No data recorded Who Could Verify You  Are Able To Have These Secured: No data recorded Do You Have any Outstanding Charges, Pending Court Dates, Parole/Probation? No data recorded Contacted To Inform of Risk of Harm To Self or Others: No data recorded   Does Patient Present under Involuntary Commitment? Yes    [place] of Residence: [place]   Patient Currently Receiving the Following Services: Medication Management   Determination of Need: Emergent (2 hours)   Options For Referral: ED Visit     CCA Biopsychosocial Patient Reported Schizophrenia/Schizoaffective Diagnosis in Past: No   Strengths: Some insight, stable housing, and polite.   Mental Health Symptoms Depression:   Fatigue; Irritability   Duration of Depressive symptoms:  Duration of Depressive Symptoms: Greater than two weeks   Mania:   None   Anxiety:    Restlessness; Sleep; Worrying; Difficulty concentrating   Psychosis:   None   Duration of Psychotic symptoms:    Trauma:   N/A   Obsessions:   N/A   Compulsions:   N/A   Inattention:   N/A   Hyperactivity/Impulsivity:   N/A   Oppositional/Defiant Behaviors:   N/A   Emotional Irregularity:   N/A   Other Mood/Personality Symptoms:  No data recorded   Mental Status Exam Appearance and self-care  Stature:   Average   Weight:   Average weight   Clothing:   Neat/clean   Grooming:   Normal   Cosmetic use:   None   Posture/gait:   Normal   Motor activity:   -- (Within normal range.)   Sensorium  Attention:   Normal   Concentration:   Normal   Orientation:   X5   Recall/memory:   Normal   Affect and Mood  Affect:   Anxious   Mood:   Anxious   Relating  Eye contact:   Normal   Facial expression:   Responsive; Anxious   Attitude toward examiner:   Cooperative   Thought and Language  Speech flow:  Clear and Coherent; Normal   Thought content:   Appropriate to Mood and Circumstances   Preoccupation:   None   Hallucinations:    None   Organization:   Intact; Goal-directed   [employer] of Knowledge:   Average   Intelligence:   Average   Abstraction:   Functional; Normal   Judgement:   Normal   Reality Testing:   Realistic; Adequate   Insight:   Fair   Decision Making:   Normal   Social Functioning  Social Maturity:   Responsible   Social Judgement:   Chemical engineer"; Normal   Stress  Stressors:   Relationship   Coping Ability:   Normal   Skill Deficits:   None   Supports:   Friends/Service system     Religion: Religion/Spirituality Are You A Religious Person?: No  Leisure/Recreation: Leisure / Recreation Do You Have Hobbies?: No  Exercise/Diet: Exercise/Diet Do You Exercise?: No Have You Gained or Lost A Significant Amount of Weight in the Past Six Months?: No Do You Follow a Special Diet?: No  Do You Have Any Trouble Sleeping?: No   CCA Employment/Education Employment/Work Situation: Employment / Work Systems developer: On disability Why is Patient on Disability: Mental Health How Long has Patient Been on Disability: Unable to quantify Patient's Job has Been Impacted by Current Illness: No Has Patient ever Been in the [employer]?: No  Education: Education Is Patient Currently Attending School?: No Did Theme park manager?: No Did You Have An Individualized Education Program (IIEP): No Did You Have Any Difficulty At School?: No Patient's Education Has Been Impacted by Current Illness: No   CCA Family/Childhood History Family and Relationship History: Family history Marital status: Single Does patient have children?: No  Childhood History:  Childhood History By whom was/is the patient raised?: Both parents Did patient suffer any verbal/emotional/physical/sexual abuse as a child?: No Did patient suffer from severe childhood neglect?: No Has patient ever been sexually abused/assaulted/raped as an adolescent or adult?: No Was  the patient ever a victim of a crime or a disaster?: No Witnessed domestic violence?: No Has patient been affected by domestic violence as an adult?: No   CCA Substance Use Alcohol/Drug Use: Alcohol / Drug Use Pain Medications: See PTA Prescriptions: See PTA Over the Counter: See PTA History of alcohol / drug use?: No history of alcohol / drug abuse Longest period of sobriety (when/how long): Unable to quantify   ASAM's:  Six Dimensions of Multidimensional Assessment  Dimension 1:  Acute Intoxication and/or Withdrawal Potential:      Dimension 2:  Biomedical Conditions and Complications:      Dimension 3:  Emotional, Behavioral, or Cognitive Conditions and Complications:     Dimension 4:  Readiness to Change:     Dimension 5:  Relapse, Continued use, or Continued Problem Potential:     Dimension 6:  Recovery/Living Environment:     ASAM Severity Score:    ASAM Recommended Level of Treatment:     Substance use Disorder (SUD)    Recommendations for Services/Supports/Treatments:    Discharge Disposition:    DSM5 Diagnoses: Patient Active Problem List   Diagnosis Date Noted   OCD (obsessive compulsive disorder) 09/16/2022   Panic attack 06/25/2022   Major depressive disorder, recurrent severe without psychotic features (HCC) 06/24/2022   Diplopia 12/15/2020   Self-inflicted laceration of wrist (HCC) 06/17/2018   Cannabis abuse 06/17/2018   Borderline personality disorder (HCC) 09/20/2012   PTSD (post-traumatic stress disorder) 09/20/2012     Referrals to Alternative Service(s): Referred to Alternative Service(s):   Place:   Date:   Time:    Referred to Alternative Service(s):   Place:   Date:   Time:    Referred to Alternative Service(s):   Place:   Date:   Time:    Referred to Alternative Service(s):   Place:   Date:   Time:     Lilyan Gilford MS, LCAS, [facility], [facility] Therapeutic Triage Specialist 01/11/2023 12:01 PM

## 2023-01-12 ENCOUNTER — Inpatient Hospital Stay
Admission: AD | Admit: 2023-01-12 | Discharge: 2023-01-16 | DRG: 885 | Disposition: A | Discharge status: Home / Self Care | Payer: 59 | Source: Intra-hospital | Attending: Psychiatry | Admitting: Psychiatry

## 2023-01-12 ENCOUNTER — Encounter: Payer: Self-pay | Admitting: Psychiatry

## 2023-01-12 ENCOUNTER — Other Ambulatory Visit: Payer: Self-pay

## 2023-01-12 DIAGNOSIS — R519 Headache, unspecified: Secondary | ICD-10-CM | POA: Diagnosis not present

## 2023-01-12 DIAGNOSIS — F4310 Post-traumatic stress disorder, unspecified: Secondary | ICD-10-CM | POA: Diagnosis present

## 2023-01-12 DIAGNOSIS — E119 Type 2 diabetes mellitus without complications: Secondary | ICD-10-CM | POA: Diagnosis present

## 2023-01-12 DIAGNOSIS — R197 Diarrhea, unspecified: Secondary | ICD-10-CM | POA: Diagnosis present

## 2023-01-12 DIAGNOSIS — Z8249 Family history of ischemic heart disease and other diseases of the circulatory system: Secondary | ICD-10-CM

## 2023-01-12 DIAGNOSIS — F603 Borderline personality disorder: Secondary | ICD-10-CM | POA: Diagnosis present

## 2023-01-12 DIAGNOSIS — Z818 Family history of other mental and behavioral disorders: Secondary | ICD-10-CM

## 2023-01-12 DIAGNOSIS — Z9152 Personal history of nonsuicidal self-harm: Secondary | ICD-10-CM

## 2023-01-12 DIAGNOSIS — T43212A Poisoning by selective serotonin and norepinephrine reuptake inhibitors, intentional self-harm, initial encounter: Secondary | ICD-10-CM | POA: Diagnosis present

## 2023-01-12 DIAGNOSIS — R112 Nausea with vomiting, unspecified: Secondary | ICD-10-CM | POA: Diagnosis present

## 2023-01-12 DIAGNOSIS — F419 Anxiety disorder, unspecified: Secondary | ICD-10-CM | POA: Diagnosis present

## 2023-01-12 DIAGNOSIS — F17210 Nicotine dependence, cigarettes, uncomplicated: Secondary | ICD-10-CM | POA: Diagnosis present

## 2023-01-12 DIAGNOSIS — R4587 Impulsiveness: Secondary | ICD-10-CM | POA: Diagnosis present

## 2023-01-12 DIAGNOSIS — Z888 Allergy status to other drugs, medicaments and biological substances status: Secondary | ICD-10-CM | POA: Diagnosis not present

## 2023-01-12 DIAGNOSIS — Z79899 Other long term (current) drug therapy: Secondary | ICD-10-CM

## 2023-01-12 DIAGNOSIS — Z833 Family history of diabetes mellitus: Secondary | ICD-10-CM | POA: Diagnosis not present

## 2023-01-12 DIAGNOSIS — F332 Major depressive disorder, recurrent severe without psychotic features: Principal | ICD-10-CM | POA: Diagnosis present

## 2023-01-12 DIAGNOSIS — D72829 Elevated white blood cell count, unspecified: Secondary | ICD-10-CM | POA: Diagnosis present

## 2023-01-12 DIAGNOSIS — G4700 Insomnia, unspecified: Secondary | ICD-10-CM | POA: Diagnosis present

## 2023-01-12 DIAGNOSIS — T50902A Poisoning by unspecified drugs, medicaments and biological substances, intentional self-harm, initial encounter: Secondary | ICD-10-CM | POA: Diagnosis present

## 2023-01-12 DIAGNOSIS — Z7984 Long term (current) use of oral hypoglycemic drugs: Secondary | ICD-10-CM | POA: Diagnosis not present

## 2023-01-12 DIAGNOSIS — Z9151 Personal history of suicidal behavior: Secondary | ICD-10-CM

## 2023-01-12 DIAGNOSIS — F339 Major depressive disorder, recurrent, unspecified: Secondary | ICD-10-CM | POA: Diagnosis present

## 2023-01-12 LAB — LIPID PANEL
Cholesterol: 270 mg/dL — ABNORMAL HIGH (ref 0–200)
HDL: 46 mg/dL (ref >40)
LDL Cholesterol: 196 mg/dL — ABNORMAL HIGH (ref 0–99)
Total CHOL/HDL Ratio: 5.9 RATIO
Triglycerides: 140 mg/dL (ref <150)
VLDL: 28 mg/dL (ref 0–40)

## 2023-01-12 LAB — TSH: TSH: 2.02 u[IU]/mL (ref 0.350–4.500)

## 2023-01-12 MED ORDER — OLANZAPINE 10 MG PO TABS: 10.0000 mg | ORAL_TABLET | Freq: Every day | ORAL | Status: DC

## 2023-01-12 MED ORDER — QUETIAPINE FUMARATE 25 MG PO TABS
50.0000 mg | ORAL_TABLET | Freq: Three times a day (TID) | ORAL | Status: DC
  Administered 2023-01-12 – 2023-01-14 (×5): 50 mg via ORAL
  Filled 2023-01-12 (×7): qty 2

## 2023-01-12 MED ORDER — PRAZOSIN HCL 2 MG PO CAPS: 4.0000 mg | ORAL_CAPSULE | Freq: Every day | ORAL | Status: DC

## 2023-01-12 MED ORDER — MIRTAZAPINE 15 MG PO TABS
45.0000 mg | ORAL_TABLET | Freq: Every day | ORAL | Status: DC
  Administered 2023-01-12 – 2023-01-15 (×4): 45 mg via ORAL
  Filled 2023-01-12 (×4): qty 3

## 2023-01-12 MED ORDER — OLANZAPINE-SAMIDORPHAN 10-10 MG PO TABS
1.0000 | ORAL_TABLET | Freq: Every day | ORAL | Status: DC
  Administered 2023-01-12 – 2023-01-16 (×5): 1 via ORAL
  Filled 2023-01-12 (×6): qty 1

## 2023-01-12 MED ORDER — PRAZOSIN HCL 2 MG PO CAPS: 5.0000 mg | ORAL_CAPSULE | Freq: Every day | ORAL | Status: DC

## 2023-01-12 MED ORDER — PROPRANOLOL HCL 20 MG PO TABS
10.0000 mg | ORAL_TABLET | Freq: Three times a day (TID) | ORAL | Status: DC
  Administered 2023-01-12 – 2023-01-16 (×10): 10 mg via ORAL
  Filled 2023-01-12 (×12): qty 1

## 2023-01-12 MED ORDER — ONDANSETRON HCL 4 MG PO TABS: 4.0000 mg | ORAL_TABLET | Freq: Every day | ORAL | Status: DC | PRN

## 2023-01-12 MED ORDER — ACETAMINOPHEN 325 MG PO TABS
650.0000 mg | ORAL_TABLET | Freq: Four times a day (QID) | ORAL | Status: DC | PRN
  Administered 2023-01-13: 650 mg via ORAL
  Filled 2023-01-12: qty 2

## 2023-01-12 MED ORDER — HALOPERIDOL LACTATE 5 MG/ML IJ SOLN: 5.0000 mg | Freq: Three times a day (TID) | INTRAMUSCULAR | Status: DC | PRN

## 2023-01-12 MED ORDER — ATENOLOL 50 MG PO TABS
50.0000 mg | ORAL_TABLET | Freq: Every day | ORAL | Status: DC
  Administered 2023-01-12 – 2023-01-16 (×5): 50 mg via ORAL
  Filled 2023-01-12 (×5): qty 1

## 2023-01-12 MED ORDER — CLONAZEPAM 0.5 MG PO TABS
0.5000 mg | ORAL_TABLET | Freq: Two times a day (BID) | ORAL | Status: DC
  Administered 2023-01-12 – 2023-01-16 (×9): 0.5 mg via ORAL
  Filled 2023-01-12 (×9): qty 1

## 2023-01-12 MED ORDER — MAGNESIUM HYDROXIDE 400 MG/5ML PO SUSP: 30.0000 mL | Freq: Every day | ORAL | Status: DC | PRN

## 2023-01-12 MED ORDER — ALUM & MAG HYDROXIDE-SIMETH 200-200-20 MG/5ML PO SUSP: 30.0000 mL | ORAL | Status: DC | PRN

## 2023-01-12 MED ORDER — HYDROXYZINE HCL 25 MG PO TABS
25.0000 mg | ORAL_TABLET | Freq: Three times a day (TID) | ORAL | Status: DC | PRN
  Administered 2023-01-12: 25 mg via ORAL
  Filled 2023-01-12: qty 1

## 2023-01-12 MED ORDER — METFORMIN HCL 500 MG PO TABS
500.0000 mg | ORAL_TABLET | Freq: Two times a day (BID) | ORAL | Status: DC
  Administered 2023-01-12 – 2023-01-16 (×8): 500 mg via ORAL
  Filled 2023-01-12 (×8): qty 1

## 2023-01-12 MED ORDER — HALOPERIDOL 5 MG PO TABS: 5.0000 mg | ORAL_TABLET | Freq: Three times a day (TID) | ORAL | Status: DC | PRN

## 2023-01-12 MED ORDER — TRAZODONE HCL 100 MG PO TABS
100.0000 mg | ORAL_TABLET | Freq: Every day | ORAL | Status: DC
  Administered 2023-01-12 – 2023-01-15 (×4): 100 mg via ORAL
  Filled 2023-01-12 (×4): qty 1

## 2023-01-12 MED ORDER — DIPHENHYDRAMINE HCL 50 MG/ML IJ SOLN: 50.0000 mg | Freq: Three times a day (TID) | INTRAMUSCULAR | Status: DC | PRN

## 2023-01-12 MED ORDER — PRAZOSIN HCL 2 MG PO CAPS
2.0000 mg | ORAL_CAPSULE | Freq: Every day | ORAL | Status: DC
  Administered 2023-01-12 – 2023-01-14 (×3): 2 mg via ORAL
  Filled 2023-01-12 (×3): qty 1

## 2023-01-12 MED ORDER — GABAPENTIN 300 MG PO CAPS
300.0000 mg | ORAL_CAPSULE | Freq: Three times a day (TID) | ORAL | Status: DC
  Administered 2023-01-12 – 2023-01-16 (×13): 300 mg via ORAL
  Filled 2023-01-12 (×13): qty 1

## 2023-01-12 MED ORDER — NICOTINE 21 MG/24HR TD PT24
21.0000 mg | MEDICATED_PATCH | TRANSDERMAL | Status: DC
  Filled 2023-01-12 (×3): qty 1

## 2023-01-12 MED ORDER — DIPHENHYDRAMINE HCL 25 MG PO CAPS
50.0000 mg | ORAL_CAPSULE | Freq: Three times a day (TID) | ORAL | Status: DC | PRN
  Administered 2023-01-12: 50 mg via ORAL
  Filled 2023-01-12: qty 2

## 2023-01-12 MED ORDER — SERTRALINE HCL 25 MG PO TABS
150.0000 mg | ORAL_TABLET | Freq: Every day | ORAL | Status: DC
  Administered 2023-01-12 – 2023-01-16 (×5): 150 mg via ORAL
  Filled 2023-01-12 (×5): qty 2

## 2023-01-12 NOTE — Group Note (Deleted)
Date:  01/12/2023 Time:  2:12 AM  Group Topic/Focus:  Wrap-Up Group:   The focus of this group is to help patients review their daily goal of treatment and discuss progress on daily workbooks.     Participation Level:  {BHH PARTICIPATION ZOXWR:60454}  Participation Quality:  {BHH PARTICIPATION QUALITY:22265}  Affect:  {BHH AFFECT:22266}  Cognitive:  {BHH COGNITIVE:22267}  Insight: {BHH Insight2:20797}  Engagement in Group:  {BHH ENGAGEMENT IN UJWJX:91478}  Modes of Intervention:  {BHH MODES OF INTERVENTION:22269}  Additional Comments:  ***  Maglione,Juandavid Dallman E 01/12/2023, 2:12 AM

## 2023-01-12 NOTE — Progress Notes (Signed)
D- Patient alert and oriented. Withdrawn and very tearful at times.Denies SI, HI, AVH, and pain. Goal not achieved at this time. A- Scheduled medications administered to patient, per MD orders. Support and encouragement provided.  Routine safety checks conducted every 15 minutes.  Patient informed to notify staff with problems or concerns. R- No adverse drug reactions noted. Patient contracts for safety at this time. Patient compliant with medications and treatment plan. Patient receptive, calm, and cooperative. Patient interacts well with others on the unit.  Patient remains safe at this time.

## 2023-01-12 NOTE — Tx Team (Signed)
Initial Treatment Plan 01/12/2023 1:58 AM Natalie Rose [id number]    PATIENT STRESSORS: Financial difficulties   Medication change or noncompliance     PATIENT STRENGTHS: Motivation for treatment/growth  Supportive family/friends    PATIENT IDENTIFIED PROBLEMS: Suicidal Ideation     Anxiety                  DISCHARGE CRITERIA:  Improved stabilization in mood, thinking, and/or behavior Reduction of life-threatening or endangering symptoms to within safe limits  PRELIMINARY DISCHARGE PLAN: Outpatient therapy Return to previous living arrangement  PATIENT/FAMILY INVOLVEMENT: This treatment plan has been presented to and reviewed with the patient, Natalie Rose, and/or family member,The patient and family have been given the opportunity to ask questions and make suggestions.  Natalie Ore, RN 01/12/2023, 1:58 AM

## 2023-01-12 NOTE — H&P (Signed)
Psychiatric Admission Assessment Adult  Patient Identification: Natalie Rose MRN:  [id number] Date of Evaluation:  01/12/2023 Chief Complaint:  MDD (major depressive disorder), recurrent episode (HCC) [F33.9] Principal Diagnosis: MDD (major depressive disorder), recurrent episode (HCC) Diagnosis:  Active Problems:   Borderline personality disorder (HCC)   PTSD (post-traumatic stress disorder)   Major depressive disorder, recurrent severe without psychotic features (HCC)   Intentional drug overdose (HCC)  History of Present Illness:  The client presented with an increase in depression and anxiety, intentionally overdosed on Trazodone and Seroquel "to stop the panic", history of BPD and depression.  High level of depression with feelings of helplessness, anhedonia, and poor motivation.  Past suicide attempts and multiple hospitalizations.  Self-harm of scratching especially when "panicing", panic attacks four times per week.  Sleep "pretty good", appetite is "ok" with no weight changes.  Denies hallucinations, mania, mania symptoms, homicidal ideations, or alcohol abuse. Cannabis use three times per week via smoking.  No trauma noted, grief from her grandmother dying when she was [age].  Nicotine use via cigarettes, ppd.  Support system of her mother and sister along with a friend.   HPI on admission in the ED per Ci[facility] Penn: Natalie Rose is a 42 yo female presenting to the ED due to intentional overdose. Patient reports having panic attacks and anxiety, stating that why I took a bunch of pills". Patient experiencing nausea during the visit. Patient confirmed that she knew there was a possibility of death due to overdose.    Patient has a psychiatric history of borderline personality disorder, PTSD, self inflicted laceration of wrist, MDD, Cannabis abuse, panic attack, OCD and previous overdose x 2 (7 years ago and 2 years ago). Patient also has a history of multiple psychiatric  hospitalizations. She denies SI/HIAVH/delusional thought/paranoia.  Associated Signs/Symptoms: Depression Symptoms:  depressed mood, anhedonia, feelings of worthlessness/guilt, suicidal attempt, anxiety, panic attacks, (Hypo) Manic Symptoms:  Impulsivity, Anxiety Symptoms:  Excessive Worry, Panic Symptoms, Psychotic Symptoms:   none PTSD Symptoms: NA Total Time spent with patient: 1 hour  Past Psychiatric History: depression, BPD  Is the patient at risk to self? Yes.    Has the patient been a risk to self in the past 6 months? Yes.    Has the patient been a risk to self within the distant past? Yes.    Is the patient a risk to others? No.  Has the patient been a risk to others in the past 6 months? No.  Has the patient been a risk to others within the distant past? No.   [place] Scale:  Flowsheet Row Admission (Current) from 01/12/2023 in [facility]ICINE ED from 01/11/2023 in [facility] Emergency Department at [facility] ED from 01/10/2023 in [facility] Emergency Department at [facility]  C-SSRS RISK CATEGORY No Risk No Risk No Risk        Prior Inpatient Therapy: Yes.   If yes, describe:  Multiple times  Prior Outpatient Therapy: Yes.   If yes, describe:  [facility]   Alcohol Screening: 1. How often do you have a drink containing alcohol?: Never 2. How many drinks containing alcohol do you have on a typical day when you are drinking?: 1 or 2 3. How often do you have six or more drinks on one occasion?: Never AUDIT-C Score: 0 4. How often during the last year have you found that you were not able to stop drinking once you had started?: Never 5. How often during the  last year have you failed to do what was normally expected from you because of drinking?: Never 6. How often during the last year have you needed a first drink in the morning to get yourself going after a heavy drinking session?: Never 7. How often during the last year have you  had a feeling of guilt of remorse after drinking?: Never 8. How often during the last year have you been unable to remember what happened the night before because you had been drinking?: Never 9. Have you or someone else been injured as a result of your drinking?: No 10. Has a relative or friend or a doctor or another health worker been concerned about your drinking or suggested you cut down?: No Alcohol Use Disorder Identification Test Final Score (AUDIT): 0 Alcohol Brief Interventions/Follow-up: Alcohol education/Brief advice Substance Abuse History in the last 12 months:  Yes.   Consequences of Substance Abuse: NA Previous Psychotropic Medications: Yes  Psychological Evaluations: Yes  Past Medical History:  Past Medical History:  Diagnosis Date   Anxiety    Arthritis    Depression    Diabetes mellitus without complication (HCC)    Mental health disorder    Migraines     Past Surgical History:  Procedure Laterality Date   MOUTH SURGERY     Family History:  Family History  Problem Relation Age of Onset   Osteopenia Mother    Heart failure Mother    Osteoarthritis Mother    Depression Mother    Diabetes Father    Family Psychiatric  History: see above Tobacco Screening:  Social History   Tobacco Use  Smoking Status Every Day   Current packs/day: 1.00   Average packs/day: 1 pack/day for 20.6 years (20.6 ttl pk-yrs)   Types: Cigarettes   Start date: 2004  Smokeless Tobacco Never    BH Tobacco Counseling     Are you interested in Tobacco Cessation Medications?  No, patient refused Counseled patient on smoking cessation:  Refused/Declined practical counseling Reason Tobacco Screening Not Completed: No value filed.       Social History:  Social History   Substance and Sexual Activity  Alcohol Use Not Currently     Social History   Substance and Sexual Activity  Drug Use Yes   Frequency: 3.0 times per week   Types: Marijuana    Additional Social History:   lives along, SSI for mental health, mother and sister live nearby      Allergies:   Allergies  Allergen Reactions   Diazepam Hives, Anxiety, Itching, Other (See Comments) and Rash    unknown unknown    Lurasidone    Lab Results:  Results for orders placed or performed during the hospital encounter of 01/11/23 (from the past 48 hour(s))  CBC with Differential     Status: Abnormal   Collection Time: 01/11/23  8:13 AM  Result Value Ref Range   WBC 14.2 (H) 4.0 - 10.5 K/uL   RBC 4.47 3.87 - 5.11 MIL/uL   Hemoglobin 12.4 12.0 - 15.0 g/dL   HCT 40.3 47.4 - 25.9 %   MCV 84.6 80.0 - 100.0 fL   MCH 27.7 26.0 - 34.0 pg   MCHC 32.8 30.0 - 36.0 g/dL   RDW 56.3 (H) 87.5 - 64.3 %   Platelets 374 150 - 400 K/uL   nRBC 0.0 0.0 - 0.2 %   Neutrophils Relative % 79 %   Neutro Abs 11.2 (H) 1.7 - 7.7 K/uL   Lymphocytes  Relative 15 %   Lymphs Abs 2.1 0.7 - 4.0 K/uL   Monocytes Relative 4 %   Monocytes Absolute 0.5 0.1 - 1.0 K/uL   Eosinophils Relative 0 %   Eosinophils Absolute 0.0 0.0 - 0.5 K/uL   Basophils Relative 0 %   Basophils Absolute 0.1 0.0 - 0.1 K/uL   Immature Granulocytes 2 %   Abs Immature Granulocytes 0.25 (H) 0.00 - 0.07 K/uL    Comment: Performed at [facility], [address] [place], [place] [address]  Comprehensive metabolic panel     Status: Abnormal   Collection Time: 01/11/23  8:13 AM  Result Value Ref Range   Sodium 139 135 - 145 mmol/L   Potassium 4.1 3.5 - 5.1 mmol/L   Chloride 103 98 - 111 mmol/L   CO2 24 22 - 32 mmol/L   Glucose, Bld 179 (H) 70 - 99 mg/dL    Comment: Glucose reference range applies only to samples taken after fasting for at least 8 hours.   BUN 11 6 - 20 mg/dL   Creatinine, Ser 6.26 0.44 - 1.00 mg/dL   Calcium 9.1 8.9 - 94.8 mg/dL   Total Protein 8.2 (H) 6.5 - 8.1 g/dL   Albumin 4.5 3.5 - 5.0 g/dL   AST 31 15 - 41 U/L   ALT 29 0 - 44 U/L   Alkaline Phosphatase 63 38 - 126 U/L   Total Bilirubin 0.6 0.3 - 1.2 mg/dL   GFR,  Estimated >54 >62 mL/min    Comment: (NOTE) Calculated using the CKD-EPI Creatinine Equation (2021)    Anion gap 12 5 - 15    Comment: Performed at [facility], [address], [place], [place] [address]  Magnesium     Status: None   Collection Time: 01/11/23  8:13 AM  Result Value Ref Range   Magnesium 2.1 1.7 - 2.4 mg/dL    Comment: Performed at [facility], [address] [place], [place] [address]  Acetaminophen level     Status: Abnormal   Collection Time: 01/11/23  8:13 AM  Result Value Ref Range   Acetaminophen (Tylenol), Serum <10 (L) 10 - 30 ug/mL    Comment: (NOTE) Therapeutic concentrations vary significantly. A range of 10-30 ug/mL  may be an effective concentration for many patients. However, some  are best treated at concentrations outside of this range. Acetaminophen concentrations >150 ug/mL at 4 hours after ingestion  and >50 ug/mL at 12 hours after ingestion are often associated with  toxic reactions.  Performed at [facility], [address] [place], [place] [address]   Salicylate level     Status: Abnormal   Collection Time: 01/11/23  8:13 AM  Result Value Ref Range   Salicylate Lvl <7.0 (L) 7.0 - 30.0 mg/dL    Comment: Performed at [facility], [address] [place], [place] [address]  Ethanol     Status: None   Collection Time: 01/11/23  8:13 AM  Result Value Ref Range   Alcohol, Ethyl (B) <10 <10 mg/dL    Comment: (NOTE) Lowest detectable limit for serum alcohol is 10 mg/dL.  For medical purposes only. Performed at [facility], [address] [place], [place] [address]   Urine Drug Screen, Qualitative [facility] only)     Status: Abnormal   Collection Time: 01/11/23 10:34 AM  Result Value Ref Range   Tricyclic, Ur Screen NONE DETECTED NONE DETECTED   Amphetamines, Ur Screen NONE DETECTED NONE DETECTED  MDMA (Ecstasy)Ur Screen NONE DETECTED NONE DETECTED   Cocaine Metabolite,Ur [place] NONE  DETECTED NONE DETECTED   Opiate, Ur Screen NONE DETECTED NONE DETECTED   Phencyclidine (PCP) Ur S NONE DETECTED NONE DETECTED   Cannabinoid 50 Ng, Ur [place] POSITIVE (A) NONE DETECTED   Barbiturates, Ur Screen NONE DETECTED NONE DETECTED   Benzodiazepine, Ur Scrn NONE DETECTED NONE DETECTED   Methadone Scn, Ur NONE DETECTED NONE DETECTED    Comment: (NOTE) Tricyclics + metabolites, urine    Cutoff 1000 ng/mL Amphetamines + metabolites, urine  Cutoff 1000 ng/mL MDMA (Ecstasy), urine              Cutoff 500 ng/mL Cocaine Metabolite, urine          Cutoff 300 ng/mL Opiate + metabolites, urine        Cutoff 300 ng/mL Phencyclidine (PCP), urine         Cutoff 25 ng/mL Cannabinoid, urine                 Cutoff 50 ng/mL Barbiturates + metabolites, urine  Cutoff 200 ng/mL Benzodiazepine, urine              Cutoff 200 ng/mL Methadone, urine                   Cutoff 300 ng/mL  The urine drug screen provides only a preliminary, unconfirmed analytical test result and should not be used for non-medical purposes. Clinical consideration and professional judgment should be applied to any positive drug screen result due to possible interfering substances. A more specific alternate chemical method must be used in order to obtain a confirmed analytical result. Gas chromatography / mass spectrometry (GC/MS) is the preferred confirm atory method. Performed at [facility], [address] [place], [place] [address]   Acetaminophen level     Status: Abnormal   Collection Time: 01/11/23  3:44 PM  Result Value Ref Range   Acetaminophen (Tylenol), Serum <10 (L) 10 - 30 ug/mL    Comment: (NOTE) Therapeutic concentrations vary significantly. A range of 10-30 ug/mL  may be an effective concentration for many patients. However, some  are best treated at concentrations outside of this range. Acetaminophen concentrations >150 ug/mL at 4 hours after ingestion  and >50 ug/mL at 12 hours after ingestion  are often associated with  toxic reactions.  Performed at [facility], [address] [place], [place] [address]     Blood Alcohol level:  Lab Results  Component Value Date   [facility] <10 01/11/2023   ETH <10 09/14/2022    Metabolic Disorder Labs:  Lab Results  Component Value Date   HGBA1C 6.2 (H) 09/17/2022   MPG 131.24 09/17/2022   MPG 116.89 06/29/2022   No results found for: "PROLACTIN" Lab Results  Component Value Date   CHOL 201 (H) 09/17/2022   TRIG 100 09/17/2022   HDL 35 (L) 09/17/2022   CHOLHDL 5.7 09/17/2022   VLDL 20 09/17/2022   LDLCALC 146 (H) 09/17/2022   LDLCALC 144 (H) 06/29/2022    Current Medications: Current Facility-Administered Medications  Medication Dose Route Frequency Provider Last Rate Last Admin   acetaminophen (TYLENOL) tablet 650 mg  650 mg Oral Q6H PRN Ajibola, Ene A, NP       alum & mag hydroxide-simeth (MAALOX/MYLANTA) 200-200-20 MG/5ML suspension 30 mL  30 mL Oral Q4H PRN Ajibola, Ene A, NP       atenolol (TENORMIN) tablet 50 mg  50 mg Oral Daily  Charm Rings, NP       clonazePAM Scarlette Calico) tablet 0.5 mg  0.5 mg Oral BID Charm Rings, NP       diphenhydrAMINE (BENADRYL) capsule 50 mg  50 mg Oral TID PRN Ajibola, Ene A, NP   50 mg at 01/12/23 0105   Or   diphenhydrAMINE (BENADRYL) injection 50 mg  50 mg Intramuscular TID PRN Ajibola, Ene A, NP       gabapentin (NEURONTIN) capsule 300 mg  300 mg Oral TID Charm Rings, NP       haloperidol (HALDOL) tablet 5 mg  5 mg Oral TID PRN Ajibola, Ene A, NP       Or   haloperidol lactate (HALDOL) injection 5 mg  5 mg Intramuscular TID PRN Ajibola, Ene A, NP       magnesium hydroxide (MILK OF MAGNESIA) suspension 30 mL  30 mL Oral Daily PRN Ajibola, Ene A, NP       metFORMIN (GLUCOPHAGE) tablet 500 mg  500 mg Oral BID WC Charm Rings, NP       mirtazapine (REMERON) tablet 45 mg  45 mg Oral QHS Seung Nidiffer, Herminio Heads, NP       OLANZapine-Samidorphan 10-10 MG TABS 1 tablet  1 tablet  Oral Daily Charm Rings, NP       ondansetron [facility]) tablet 4 mg  4 mg Oral Daily PRN Charm Rings, NP       prazosin (MINIPRESS) capsule 2 mg  2 mg Oral QHS Charm Rings, NP       propranolol (INDERAL) tablet 10 mg  10 mg Oral TID Charm Rings, NP       QUEtiapine (SEROQUEL) tablet 50 mg  50 mg Oral TID Charm Rings, NP       sertraline (ZOLOFT) tablet 150 mg  150 mg Oral Daily Charm Rings, NP       traZODone (DESYREL) tablet 100 mg  100 mg Oral QHS Charm Rings, NP       PTA Medications: Medications Prior to Admission  Medication Sig Dispense Refill Last Dose   atenolol (TENORMIN) 50 MG tablet Take 1 tablet (50 mg total) by mouth daily. 30 tablet 1    clonazePAM (KLONOPIN) 0.5 MG tablet Take 1 tablet (0.5 mg total) by mouth 2 (two) times daily. 60 tablet 1    gabapentin (NEURONTIN) 300 MG capsule Take 1 capsule (300 mg total) by mouth 3 (three) times daily. 90 capsule 1    LYBALVI 10-10 MG TABS Take 1 tablet by mouth daily.      metFORMIN (GLUCOPHAGE) 500 MG tablet Take 1 tablet (500 mg total) by mouth 2 (two) times daily with a meal. 60 tablet 1    mirtazapine (REMERON) 45 MG tablet Take 1 tablet (45 mg total) by mouth at bedtime. 30 tablet 1    OLANZapine (ZYPREXA) 10 MG tablet Take 1 tablet (10 mg total) by mouth at bedtime. 30 tablet 1    ondansetron (ZOFRAN) 4 MG tablet Take 1 tablet (4 mg total) by mouth daily as needed for nausea or vomiting. 30 tablet 1    prazosin (MINIPRESS) 2 MG capsule Take 2 capsules (4 mg total) by mouth at bedtime. 60 capsule 1    prazosin (MINIPRESS) 5 MG capsule Take 5 mg by mouth at bedtime.      propranolol (INDERAL) 10 MG tablet Take 1 tablet (10 mg total) by mouth 3 (three) times daily. 90 tablet 1  QUEtiapine (SEROQUEL) 50 MG tablet Take 1 tablet (50 mg total) by mouth 2 (two) times daily. 60 tablet 1    sertraline (ZOLOFT) 50 MG tablet Take 3 tablets (150 mg total) by mouth daily. 90 tablet 1    traZODone (DESYREL) 100 MG  tablet Take 1 tablet (100 mg total) by mouth at bedtime. 30 tablet 1     Musculoskeletal: Strength & Muscle Tone: within normal limits Gait & Station: normal Patient leans: N/A  Psychiatric Specialty Exam: Physical Exam Vitals and nursing note reviewed.  Constitutional:      Appearance: Normal appearance.  HENT:     Head: Normocephalic.     Nose: Nose normal.  Pulmonary:     Effort: Pulmonary effort is normal.  Musculoskeletal:        General: Normal range of motion.     Cervical back: Normal range of motion.  Neurological:     General: No focal deficit present.     Mental Status: She is alert and oriented to person, place, and time.  Psychiatric:        Attention and Perception: Attention and perception normal.        Mood and Affect: Mood is anxious and depressed.        Speech: Speech normal.        Behavior: Behavior normal. Behavior is cooperative.        Thought Content: Thought content normal.        Cognition and Memory: Cognition and memory normal.        Judgment: Judgment is impulsive.       Review of Systems  Psychiatric/Behavioral:  Positive for depression. The patient is nervous/anxious.   All other systems reviewed and are negative.    Blood pressure (!) 117/94, pulse 83, temperature 98.8 F (37.1 C), resp. rate (!) 22, height 4\' 11"  (1.499 m), weight 80.7 kg, last menstrual period 01/02/2023, SpO2 99%.Body mass index is 35.95 kg/m.  General Appearance: Casual  Eye Contact:  Fair  Speech:  Normal Rate  Volume:  Normal  Mood:  Anxious and Depressed  Affect:  Congruent  Thought Process:  Coherent  Orientation:  Full (Time, Place, and Person)  Thought Content:  Rumination  Suicidal Thoughts:  No  Homicidal Thoughts:  No  Memory:  Immediate;   Fair Recent;   Fair Remote;   Fair  Judgement:  Poor  Insight:  Fair  Psychomotor Activity:  Decreased  Concentration:  Concentration: Fair and Attention Span: Fair  Recall:  [employer] of Knowledge:  Fair   Language:  Good  Akathisia:  No  Handed:  Right  AIMS (if indicated):     Assets:  Housing Leisure Time Physical Health Resilience Social Support  ADL's:  Intact  Cognition:  WNL  Sleep:      Physical Exam: Physical Exam Vitals and nursing note reviewed.  Constitutional:      Appearance: Normal appearance.  HENT:     Head: Normocephalic.     Nose: Nose normal.  Pulmonary:     Effort: Pulmonary effort is normal.  Musculoskeletal:        General: Normal range of motion.     Cervical back: Normal range of motion.  Neurological:     General: No focal deficit present.     Mental Status: She is alert and oriented to person, place, and time.  Psychiatric:        Attention and Perception: Attention and perception normal.  Mood and Affect: Mood is anxious and depressed.        Speech: Speech normal.        Behavior: Behavior normal. Behavior is cooperative.        Thought Content: Thought content normal.        Cognition and Memory: Cognition and memory normal.        Judgment: Judgment is impulsive.    Review of Systems  Psychiatric/Behavioral:  Positive for depression. The patient is nervous/anxious.   All other systems reviewed and are negative.  Blood pressure (!) 117/94, pulse 83, temperature 98.8 F (37.1 C), resp. rate (!) 22, height 4\' 11"  (1.499 m), weight 80.7 kg, last menstrual period 01/02/2023, SpO2 99%. Body mass index is 35.95 kg/m.  Treatment Plan Summary: Daily contact with patient to assess and evaluate symptoms and progress in treatment, Medication management, and Plan : Major depressive disorder, recurrent, severe without psychosis: Zoloft 150 mg daily Lybalvi 10-10 daily A1C, TSH, and lipid panel ordered; EKG completed in ED with QT interval WDL  Anxiety: Seroquel 50 mg TID per her provider at [facility] Gabapentin 300 mg TID Klonopin 0.5 mg BID  Insomnia Trazodone 100 mg at bedtime Remeron 45 mg at bedtime  Nightmares: Prazosin  6 mg daily   Observation Level/Precautions:  15 minute checks  Laboratory:  Completed in ED, reviewed, stable; additional labs ordered  Psychotherapy:  individual and group therapy  Medications:  See MAR  Consultations:  none  Discharge Concerns:  none  Estimated LOS:  5-6 days  Other:     Physician Treatment Plan for Primary Diagnosis: MDD (major depressive disorder), recurrent episode (HCC) Long Term Goal(s): Improvement in symptoms so as ready for discharge  Short Term Goals: Ability to identify changes in lifestyle to reduce recurrence of condition will improve, Ability to verbalize feelings will improve, Ability to disclose and discuss suicidal ideas, Ability to demonstrate self-control will improve, Ability to identify and develop effective coping behaviors will improve, Ability to maintain clinical measurements within normal limits will improve, Compliance with prescribed medications will improve, and Ability to identify triggers associated with substance abuse/mental health issues will improve  Physician Treatment Plan for Secondary Diagnosis: Active Problems:   Borderline personality disorder (HCC)   PTSD (post-traumatic stress disorder)   Major depressive disorder, recurrent severe without psychotic features (HCC)   Intentional drug overdose (HCC)  Long Term Goal(s): Improvement in symptoms so as ready for discharge  Short Term Goals: Ability to identify changes in lifestyle to reduce recurrence of condition will improve, Ability to verbalize feelings will improve, Ability to disclose and discuss suicidal ideas, Ability to demonstrate self-control will improve, Ability to identify and develop effective coping behaviors will improve, Ability to maintain clinical measurements within normal limits will improve, Compliance with prescribed medications will improve, and Ability to identify triggers associated with substance abuse/mental health issues will improve  I certify that  inpatient services furnished can reasonably be expected to improve the patient's condition.    Nanine Means, NP 8/10/202411:37 AM

## 2023-01-12 NOTE — Progress Notes (Signed)
Patient alert and oriented x 4, she is admitted for suicidal ideation and anxiety. Patient upon arrival appears anxious, tearful and restless she stated " I am sad"  patient was offered emotional support and encouragement,  and was calm during the admission process. Patient currently denies SI/HI/AVH, affect is blunted but brightens upon approach. 15 minutes safety checks maintained will continue to monitor closely.

## 2023-01-12 NOTE — Group Note (Signed)
Date:  01/12/2023 Time:  11:15 AM  Group Topic/Focus:  Therapeutic Outdoor Recreation Activity.    Participation Level:  Minimal  Participation Quality:  Appropriate  Affect:  Appropriate  Cognitive:  Appropriate  Insight: Limited  Engagement in Group:  Limited  Modes of Intervention:  Socialization  Additional Comments:    Linna Thebeau 01/12/2023, 11:15 AM

## 2023-01-12 NOTE — Group Note (Signed)
Date:  01/12/2023 Time:  9:29 PM  Group Topic/Focus:  Wrap-Up Group:   The focus of this group is to help patients review their daily goal of treatment and discuss progress on daily workbooks.    Participation Level:  Minimal  Participation Quality:  Appropriate and Drowsy  Affect:  Appropriate and Flat  Cognitive:  Alert  Insight: Good  Engagement in Group:  Limited  Modes of Intervention:  Activity  Additional Comments:     Maglione,Arnella Pralle E 01/12/2023, 9:29 PM

## 2023-01-12 NOTE — Group Note (Signed)
Date:  01/12/2023 Time:  4:53 PM  Group Topic/Focus:  Goals Group:   The focus of this group is to help patients establish daily goals to achieve during treatment and discuss how the patient can incorporate goal setting into their daily lives to aide in recovery. Making Healthy Choices:   The focus of this group is to help patients identify negative/unhealthy choices they were using prior to admission and identify positive/healthier coping strategies to replace them upon discharge.    Participation Level:  Did Not Attend   Lynelle Smoke [facility] [place] 01/12/2023, 4:53 PM

## 2023-01-12 NOTE — Progress Notes (Signed)
Patient had a choking episode while eating lu with the heimlich maneuver performed by the [facility]. On this nurse's arrival patient was coughing, tearing up and red but able to speak. States still can feel it in her midback/shoulder blades. VSS with sp02 at 100%. AC Lindsey notified of incident as well as the NP [place].  After about 15 minutes patient was calm, and seen talking on the phone in full sentences.  Will continue to monitor.

## 2023-01-12 NOTE — Group Note (Signed)
Date:  01/12/2023 Time:  6:34 PM  Group Topic/Focus:  Activity Group    Participation Level:  Minimal  Participation Quality:  Appropriate  Affect:  Appropriate  Cognitive:  Appropriate  Insight: Appropriate  Engagement in Group:  Engaged  Modes of Intervention:  Activity  Additional Comments:    Wilford Corner 01/12/2023, 6:34 PM

## 2023-01-12 NOTE — Plan of Care (Signed)
  Problem: Education: Goal: Knowledge of General Education information will improve Description: Including pain rating scale, medication(s)/side effects and non-pharmacologic comfort measures Outcome: Progressing   Problem: Activity: Goal: Risk for activity intolerance will decrease Outcome: Progressing   Problem: Safety: Goal: Ability to remain free from injury will improve Outcome: Progressing  Patient has great knowledge and insight about her diagnosis, no distress noted at this time.

## 2023-01-12 NOTE — BHH Suicide Risk Assessment (Addendum)
[facility] Admission Suicide Risk Assessment   Nursing information obtained from:  Patient Demographic factors:  Caucasian, Adolescent or young adult, Unemployed Current Mental Status:  NA Loss Factors:  Decline in physical health Historical Factors:  Impulsivity, Prior suicide attempts Risk Reduction Factors:  Positive social support  Total Time spent with patient: 1 hour  Principal Problem: MDD (major depressive disorder), recurrent episode (HCC) Diagnosis:  Active Problems:   Borderline personality disorder (HCC)   PTSD (post-traumatic stress disorder)   Major depressive disorder, recurrent severe without psychotic features (HCC)   Intentional drug overdose (HCC)  Subjective Data:  The client presented with an increase in depression and anxiety, intentionally overdosed on Trazodone and Seroquel "to stop the panic", history of BPD and depression.  High level of depression with feelings of helplessness, anhedonia, and poor motivation.  Past suicide attempts and multiple hospitalizations.  Self-harm of scratching especially when "panicing", panic attacks four times per week.  Sleep "pretty good", appetite is "ok" with no weight changes.  Denies hallucinations, mania, mania symptoms, homicidal ideations, or alcohol abuse. Cannabis use three times per week via smoking. No trauma noted, grief from her grandmother dying when she was [age].  Nicotine use via cigarettes, ppd.  Support system of her mother and sister along with a friend.  HPI on admission in the ED per CiCi Penn: Natalie Rose is a 42 yo female presenting to the ED due to intentional overdose. Patient reports having panic attacks and anxiety, stating that why I took a bunch of pills". Patient experiencing nausea during the visit. Patient confirmed that she knew there was a possibility of death due to overdose.   Patient has a psychiatric history of borderline personality disorder, PTSD, self inflicted laceration of wrist, MDD, Cannabis  abuse, panic attack, OCD and previous overdose x 2 (7 years ago and 2 years ago). Patient also has a history of multiple psychiatric hospitalizations. She denies SI/HIAVH/delusional thought/paranoia.  Continued Clinical Symptoms:  Alcohol Use Disorder Identification Test Final Score (AUDIT): 0 The "Alcohol Use Disorders Identification Test", Guidelines for Use in Primary Care, Second Edition.  World Science writer [facility]). Score between 0-7:  no or low risk or alcohol related problems. Score between 8-15:  moderate risk of alcohol related problems. Score between 16-19:  high risk of alcohol related problems. Score 20 or above:  warrants further diagnostic evaluation for alcohol dependence and treatment.   CLINICAL FACTORS:   Depression:   Impulsivity Severe   Musculoskeletal: Strength & Muscle Tone: within normal limits Gait & Station: normal Patient leans: N/A  Psychiatric Specialty Exam: Physical Exam Vitals and nursing note reviewed.  Constitutional:      Appearance: Normal appearance.  HENT:     Head: Normocephalic.     Nose: Nose normal.  Pulmonary:     Effort: Pulmonary effort is normal.  Musculoskeletal:        General: Normal range of motion.     Cervical back: Normal range of motion.  Neurological:     General: No focal deficit present.     Mental Status: She is alert and oriented to person, place, and time.  Psychiatric:        Attention and Perception: Attention and perception normal.        Mood and Affect: Mood is anxious and depressed.        Speech: Speech normal.        Behavior: Behavior normal. Behavior is cooperative.        Thought Content: Thought  content normal.        Cognition and Memory: Cognition and memory normal.        Judgment: Judgment is impulsive.     Review of Systems  Psychiatric/Behavioral:  Positive for depression. The patient is nervous/anxious.   All other systems reviewed and are negative.   Blood pressure (!) 117/94, pulse  83, temperature 98.8 F (37.1 C), resp. rate (!) 22, height 4\' 11"  (1.499 m), weight 80.7 kg, last menstrual period 01/02/2023, SpO2 99%.Body mass index is 35.95 kg/m.  General Appearance: Casual  Eye Contact:  Fair  Speech:  Normal Rate  Volume:  Normal  Mood:  Anxious and Depressed  Affect:  Congruent  Thought Process:  Coherent  Orientation:  Full (Time, Place, and Person)  Thought Content:  Rumination  Suicidal Thoughts:  No  Homicidal Thoughts:  No  Memory:  Immediate;   Fair Recent;   Fair Remote;   Fair  Judgement:  Poor  Insight:  Fair  Psychomotor Activity:  Decreased  Concentration:  Concentration: Fair and Attention Span: Fair  Recall:  [employer] of Knowledge:  Fair  Language:  Good  Akathisia:  No  Handed:  Right  AIMS (if indicated):     Assets:  Housing Leisure Time Physical Health Resilience Social Support  ADL's:  Intact  Cognition:  WNL  Sleep:         Physical Exam: Physical Exam Vitals and nursing note reviewed.  Constitutional:      Appearance: Normal appearance.  HENT:     Head: Normocephalic.     Nose: Nose normal.  Pulmonary:     Effort: Pulmonary effort is normal.  Musculoskeletal:        General: Normal range of motion.     Cervical back: Normal range of motion.  Neurological:     General: No focal deficit present.     Mental Status: She is alert and oriented to person, place, and time.  Psychiatric:        Attention and Perception: Attention and perception normal.        Mood and Affect: Mood is anxious and depressed.        Speech: Speech normal.        Behavior: Behavior normal. Behavior is cooperative.        Thought Content: Thought content normal.        Cognition and Memory: Cognition and memory normal.        Judgment: Judgment is impulsive.    Review of Systems  Psychiatric/Behavioral:  Positive for depression. The patient is nervous/anxious.   All other systems reviewed and are negative.  Blood pressure (!)  117/94, pulse 83, temperature 98.8 F (37.1 C), resp. rate (!) 22, height 4\' 11"  (1.499 m), weight 80.7 kg, last menstrual period 01/02/2023, SpO2 99%. Body mass index is 35.95 kg/m.   COGNITIVE FEATURES THAT CONTRIBUTE TO RISK:  None    SUICIDE RISK:   Moderate:  Frequent suicidal ideation with limited intensity, and duration, some specificity in terms of plans, no associated intent, good self-control, limited dysphoria/symptomatology, some risk factors present, and identifiable protective factors, including available and accessible social support.  PLAN OF CARE:  Major depressive disorder, recurrent, severe without psychosis: Zoloft 150 mg daily Lybalvi 10-10 daily A1C, TSH, and lipid panel ordered; EKG completed in ED with QT interval WDL  Anxiety: Seroquel 50 mg TID per her provider at [facility] Gabapentin 300 mg TID Klonopin 0.5 mg BID  Insomnia Trazodone  100 mg at bedtime Remeron 45 mg at bedtime  Nightmares: Prazosin 6 mg daily  I certify that inpatient services furnished can reasonably be expected to improve the patient's condition.   Nanine Means, NP 01/12/2023, 11:15 AM

## 2023-01-13 DIAGNOSIS — F332 Major depressive disorder, recurrent severe without psychotic features: Secondary | ICD-10-CM | POA: Diagnosis not present

## 2023-01-13 NOTE — Plan of Care (Signed)
  Problem: Education: Goal: Knowledge of General Education information will improve Description: Including pain rating scale, medication(s)/side effects and non-pharmacologic comfort measures Outcome: Progressing   Problem: Activity: Goal: Risk for activity intolerance will decrease Outcome: Progressing   Problem: Nutrition: Goal: Adequate nutrition will be maintained Outcome: Progressing   Problem: Coping: Goal: Level of anxiety will decrease Outcome: Progressing   

## 2023-01-13 NOTE — Group Note (Signed)
Date:  01/13/2023 Time:  7:19 PM  Group Topic/Focus:  Activity/Outdoor Recreation    Participation Level:  Minimal  Participation Quality:  Appropriate  Affect:  Appropriate  Cognitive:  Appropriate  Insight: Appropriate  Engagement in Group:  Engaged  Modes of Intervention:  Activity  Additional Comments:    Lynelle Smoke Andon Villard 01/13/2023, 7:19 PM

## 2023-01-13 NOTE — Group Note (Signed)
LCSW Group Therapy Note  Group Date: 01/13/2023 Start Time: 1320 End Time: 1420   Type of Therapy and Topic:  Group Therapy -  Coping Skills  Participation Level:  Did Not Attend   Description of Group The focus of this group was to determine what unhealthy coping techniques typically are used by group members and what healthy coping techniques would be helpful in coping with various problems. Patients were guided in becoming aware of the differences between healthy and unhealthy coping techniques. Patients were asked to identify 2-3 healthy coping skills they would like to learn to use more effectively.  Therapeutic Goals Patients learned that coping is what human beings do all day long to deal with various situations in their lives Patients defined and discussed healthy vs unhealthy coping techniques Patients identified their preferred coping techniques and identified whether these were healthy or unhealthy Patients determined 2-3 healthy coping skills they would like to become more familiar with and use more often. Patients provided support and ideas to each other   Summary of Patient Progress:  The patient did not attend group.    Marshell Levan, LCSWA 01/13/2023  3:06 PM

## 2023-01-13 NOTE — Group Note (Signed)
Date:  01/13/2023 Time:  11:31 AM  Group Topic/Focus:  Activity/Outdoor Recreation     Participation Level:  Did Not Attend   Lynelle Smoke [facility] 01/13/2023, 11:31 AM

## 2023-01-13 NOTE — Progress Notes (Signed)
[facility] MD Progress Note  01/13/2023 11:24 AM Natalie Rose  MRN:  [id number]  Subjective:   Notes, vital signs, and labs reviewed prior to the assessment.  The client was resting quietly in her bed during the assessment as she reported a headache, acetaminophen given earlier and awaiting relief.  Her depression was "not that bad" at a 1/10 with 10 being the highest, no suicidal ideations.  Denies anxiety.  Some sleepiness reported from her medications with sleep in general being "good".  Appetite is "not as good" as she did choke on some hamburger yesterday which scared her, no physical symptoms resulted per assessment yesterday regarding this incident.  She "just fears it may happen again", discussed chewing her food well and encouragement provided.    Principal Problem: Severe episode of recurrent major depressive disorder, without psychotic features (HCC) Diagnosis: Principal Problem:   Severe episode of recurrent major depressive disorder, without psychotic features (HCC) Active Problems:   Borderline personality disorder (HCC)   PTSD (post-traumatic stress disorder)   Intentional drug overdose (HCC)  Total Time spent with patient: 30 minutes  Past Psychiatric History: PTSD, depression, BPD  Past Medical History:  Past Medical History:  Diagnosis Date   Anxiety    Arthritis    Depression    Diabetes mellitus without complication (HCC)    Mental health disorder    Migraines     Past Surgical History:  Procedure Laterality Date   MOUTH SURGERY     Family History:  Family History  Problem Relation Age of Onset   Osteopenia Mother    Heart failure Mother    Osteoarthritis Mother    Depression Mother    Diabetes Father    Family Psychiatric  History: see above Social History:  Social History   Substance and Sexual Activity  Alcohol Use Not Currently     Social History   Substance and Sexual Activity  Drug Use Yes   Frequency: 3.0 times per week   Types:  Marijuana    Social History   Socioeconomic History   Marital status: Single    Spouse name: Not on file   Number of children: Not on file   Years of education: Not on file   Highest education level: Not on file  Occupational History   Not on file  Tobacco Use   Smoking status: Every Day    Current packs/day: 1.00    Average packs/day: 1 pack/day for 20.6 years (20.6 ttl pk-yrs)    Types: Cigarettes    Start date: 2004   Smokeless tobacco: Never  Vaping Use   Vaping status: Never Used  Substance and Sexual Activity   Alcohol use: Not Currently   Drug use: Yes    Frequency: 3.0 times per week    Types: Marijuana   Sexual activity: Not Currently    Birth control/protection: None  Other Topics Concern   Not on file  Social History Narrative   Not on file   Social Determinants of Health   Financial Resource Strain: Medium Risk (03/08/2020)   Received from [facility], [facility]   Overall Financial Resource Strain (CARDIA)    Difficulty of Paying Living Expenses: Somewhat hard  Food Insecurity: No Food Insecurity (01/12/2023)   Hunger Vital Sign    Worried About Running Out of Food in the Last Year: Never true    Ran Out of Food in the Last Year: Never true  Transportation Needs: No Transportation Needs (01/12/2023)  PRAPARE - Administrator, Civil Service (Medical): No    Lack of Transportation (Non-Medical): No  Physical Activity: Insufficiently Active (05/02/2021)   Received from [facility], [facility]   Exercise Vital Sign    Days of Exercise per Week: 4 days    Minutes of Exercise per Session: 30 min  Stress: Not on file  Social Connections: Not on file   Additional Social History: lives along with her mother and sister nearby    Sleep: Good  Appetite:  Fair  Current Medications: Current Facility-Administered Medications  Medication Dose Route Frequency Provider Last Rate Last Admin   acetaminophen (TYLENOL) tablet 650 mg   650 mg Oral Q6H PRN Ajibola, Ene A, NP   650 mg at 01/13/23 0817   alum & mag hydroxide-simeth (MAALOX/MYLANTA) 200-200-20 MG/5ML suspension 30 mL  30 mL Oral Q4H PRN Ajibola, Ene A, NP       atenolol (TENORMIN) tablet 50 mg  50 mg Oral Daily Charm Rings, NP   50 mg at 01/13/23 0820   clonazePAM (KLONOPIN) tablet 0.5 mg  0.5 mg Oral BID Charm Rings, NP   0.5 mg at 01/13/23 1610   diphenhydrAMINE (BENADRYL) capsule 50 mg  50 mg Oral TID PRN Ajibola, Ene A, NP   50 mg at 01/12/23 0105   Or   diphenhydrAMINE (BENADRYL) injection 50 mg  50 mg Intramuscular TID PRN Ajibola, Ene A, NP       gabapentin (NEURONTIN) capsule 300 mg  300 mg Oral TID Charm Rings, NP   300 mg at 01/13/23 0818   haloperidol (HALDOL) tablet 5 mg  5 mg Oral TID PRN Ajibola, Ene A, NP       Or   haloperidol lactate (HALDOL) injection 5 mg  5 mg Intramuscular TID PRN Ajibola, Ene A, NP       magnesium hydroxide (MILK OF MAGNESIA) suspension 30 mL  30 mL Oral Daily PRN Ajibola, Ene A, NP       metFORMIN (GLUCOPHAGE) tablet 500 mg  500 mg Oral BID WC Charm Rings, NP   500 mg at 01/13/23 0818   mirtazapine (REMERON) tablet 45 mg  45 mg Oral QHS Charm Rings, NP   45 mg at 01/12/23 2131   nicotine (NICODERM CQ - dosed in mg/24 hours) patch 21 mg  21 mg Transdermal Q24H Charm Rings, NP       OLANZapine-Samidorphan 10-10 MG TABS 1 tablet  1 tablet Oral Daily Charm Rings, NP   1 tablet at 01/13/23 0917   ondansetron (ZOFRAN) tablet 4 mg  4 mg Oral Daily PRN Charm Rings, NP       prazosin (MINIPRESS) capsule 2 mg  2 mg Oral QHS Charm Rings, NP   2 mg at 01/12/23 2132   propranolol (INDERAL) tablet 10 mg  10 mg Oral TID Charm Rings, NP   10 mg at 01/13/23 9604   QUEtiapine (SEROQUEL) tablet 50 mg  50 mg Oral TID Charm Rings, NP   50 mg at 01/13/23 0820   sertraline (ZOLOFT) tablet 150 mg  150 mg Oral Daily Charm Rings, NP   150 mg at 01/13/23 5409   traZODone (DESYREL) tablet 100 mg  100 mg  Oral QHS Charm Rings, NP   100 mg at 01/12/23 2131    Lab Results:  Results for orders placed or performed during the hospital encounter of 01/11/23 (from  the past 48 hour(s))  Acetaminophen level     Status: Abnormal   Collection Time: 01/11/23  3:44 PM  Result Value Ref Range   Acetaminophen (Tylenol), Serum <10 (L) 10 - 30 ug/mL    Comment: (NOTE) Therapeutic concentrations vary significantly. A range of 10-30 ug/mL  may be an effective concentration for many patients. However, some  are best treated at concentrations outside of this range. Acetaminophen concentrations >150 ug/mL at 4 hours after ingestion  and >50 ug/mL at 12 hours after ingestion are often associated with  toxic reactions.  Performed at [facility], [address] [place], [place] [address]     Blood Alcohol level:  Lab Results  Component Value Date   [facility] <10 01/11/2023   ETH <10 09/14/2022    Metabolic Disorder Labs: Lab Results  Component Value Date   HGBA1C 6.2 (H) 09/17/2022   MPG 131.24 09/17/2022   MPG 116.89 06/29/2022   No results found for: "PROLACTIN" Lab Results  Component Value Date   CHOL 270 (H) 01/11/2023   TRIG 140 01/11/2023   HDL 46 01/11/2023   CHOLHDL 5.9 01/11/2023   VLDL 28 01/11/2023   LDLCALC 196 (H) 01/11/2023   LDLCALC 146 (H) 09/17/2022    Musculoskeletal: Strength & Muscle Tone: within normal limits Gait & Station: normal Patient leans: N/A  Psychiatric Specialty Exam: Physical Exam Vitals and nursing note reviewed.  Constitutional:      Appearance: Normal appearance.  HENT:     Head: Normocephalic.     Nose: Nose normal.  Pulmonary:     Effort: Pulmonary effort is normal.  Musculoskeletal:        General: Normal range of motion.     Cervical back: Normal range of motion.  Neurological:     General: No focal deficit present.     Mental Status: She is alert and oriented to person, place, and time.  Psychiatric:        Attention and  Perception: Attention and perception normal.        Mood and Affect: Mood is depressed.        Speech: Speech normal.        Behavior: Behavior normal. Behavior is cooperative.        Thought Content: Thought content normal.        Cognition and Memory: Cognition and memory normal.        Judgment: Judgment is impulsive.     Review of Systems  Neurological:  Positive for headaches.  Psychiatric/Behavioral:  Positive for depression.   All other systems reviewed and are negative.   Blood pressure 101/70, pulse 63, temperature 98.3 F (36.8 C), resp. rate 20, height 4\' 11"  (1.499 m), weight 80.7 kg, last menstrual period 01/02/2023, SpO2 97%.Body mass index is 35.95 kg/m.  General Appearance: Casual  Eye Contact:  Fair  Speech:  Normal Rate  Volume:  Normal  Mood:  Depressed  Affect:  Blunt  Thought Process:  Coherent  Orientation:  Full (Time, Place, and Person)  Thought Content:  Rumination  Suicidal Thoughts:  No  Homicidal Thoughts:  No  Memory:  Immediate;   Fair Recent;   Fair Remote;   Fair  Judgement:  Fair  Insight:  Fair  Psychomotor Activity:  Normal  Concentration:  Concentration: Fair and Attention Span: Fair  Recall:  [employer] of Knowledge:  Fair  Language:  Good  Akathisia:  No  Handed:  Right  AIMS (if indicated):  Assets:  Housing Leisure Time Physical Health Resilience Social Support  ADL's:  Intact  Cognition:  WNL  Sleep:         Physical Exam: Physical Exam Vitals and nursing note reviewed.  Constitutional:      Appearance: Normal appearance.  HENT:     Head: Normocephalic.     Nose: Nose normal.  Pulmonary:     Effort: Pulmonary effort is normal.  Musculoskeletal:        General: Normal range of motion.     Cervical back: Normal range of motion.  Neurological:     General: No focal deficit present.     Mental Status: She is alert and oriented to person, place, and time.  Psychiatric:        Attention and Perception:  Attention and perception normal.        Mood and Affect: Mood is depressed.        Speech: Speech normal.        Behavior: Behavior normal. Behavior is cooperative.        Thought Content: Thought content normal.        Cognition and Memory: Cognition and memory normal.        Judgment: Judgment is impulsive.    Review of Systems  Neurological:  Positive for headaches.  Psychiatric/Behavioral:  Positive for depression.   All other systems reviewed and are negative.  Blood pressure 101/70, pulse 63, temperature 98.3 F (36.8 C), resp. rate 20, height 4\' 11"  (1.499 m), weight 80.7 kg, last menstrual period 01/02/2023, SpO2 97%. Body mass index is 35.95 kg/m.   Treatment Plan Summary: Daily contact with patient to assess and evaluate symptoms and progress in treatment, Medication management, and Plan : Major depressive disorder, recurrent, severe without psychosis: Zoloft 150 mg daily Lybalvi 10-10 daily A1C awaiting results, TSH WDL, and lipid panel with cholesterol 270 H and LDL 196 H; EKG completed in ED with QT interval WDL   Anxiety: Seroquel 50 mg TID per her provider at [facility] Gabapentin 300 mg TID Klonopin 0.5 mg BID   Insomnia Trazodone 100 mg at bedtime Remeron 45 mg at bedtime   Nightmares: Prazosin 6 mg daily  Nanine Means, NP 01/13/2023, 11:24 AM

## 2023-01-13 NOTE — Progress Notes (Signed)
   01/13/23 0818  Psych Admission Type (Psych Patients Only)  Admission Status Involuntary  Psychosocial Assessment  Patient Complaints Anxiety  Eye Contact Brief  Facial Expression Anxious  Affect Anxious  Speech Logical/coherent  Motor Activity Slow  Appearance/Hygiene In scrubs  Behavior Characteristics Cooperative  Mood Anxious;Pleasant  Thought Process  Coherency Circumstantial  Content Blaming others;Preoccupation  Delusions None reported or observed  Perception WDL  Hallucination None reported or observed  Judgment WDL  Confusion WDL  Danger to Self  Current suicidal ideation? Denies  Danger to Others  Danger to Others None reported or observed   Patient is alert and oriented X4.  Patient denies SI/HI/AVH.  Patient denies pain.  Patient is calm and cooperative.  Patient states she feels better today.  She feels more in control,  Patient continues on Q 15 minute checks.

## 2023-01-13 NOTE — Group Note (Signed)
Date:  01/13/2023 Time:  11:21 AM  Group Topic/Focus:  Goals Group:   The focus of this group is to help patients establish daily goals to achieve during treatment and discuss how the patient can incorporate goal setting into their daily lives to aide in recovery.    Participation Level:  Active  Participation Quality:  Appropriate  Affect:  Appropriate  Cognitive:  Appropriate  Insight: Appropriate  Engagement in Group:  Engaged  Modes of Intervention:  Discussion and Education  Additional Comments:    Wilford Corner 01/13/2023, 11:21 AM

## 2023-01-13 NOTE — Plan of Care (Signed)
  Problem: Clinical Measurements: Goal: Diagnostic test results will improve Outcome: Progressing   Problem: Coping: Goal: Level of anxiety will decrease Outcome: Progressing   
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
## 2023-01-14 DIAGNOSIS — F332 Major depressive disorder, recurrent severe without psychotic features: Secondary | ICD-10-CM | POA: Diagnosis not present

## 2023-01-14 LAB — HEMOGLOBIN A1C
Hgb A1c MFr Bld: 6.4 % — ABNORMAL HIGH (ref 4.8–5.6)
Mean Plasma Glucose: 137 mg/dL

## 2023-01-14 NOTE — Group Note (Unsigned)
Date:  01/14/2023 Time:  11:06 PM  Group Topic/Focus:  Wrap-Up Group:   The focus of this group is to help patients review their daily goal of treatment and discuss progress on daily workbooks.     Participation Level:  {BHH PARTICIPATION ZOXWR:60454}  Participation Quality:  {BHH PARTICIPATION QUALITY:22265}  Affect:  {BHH AFFECT:22266}  Cognitive:  {BHH COGNITIVE:22267}  Insight: {BHH Insight2:20797}  Engagement in Group:  {BHH ENGAGEMENT IN UJWJX:91478}  Modes of Intervention:  {BHH MODES OF INTERVENTION:22269}  Additional Comments:  ***  Nancyjo Givhan 01/14/2023, 11:06 PM

## 2023-01-14 NOTE — Plan of Care (Signed)
  Problem: Coping: Goal: Level of anxiety will decrease Outcome: Progressing  Patient appears less anxious after given vistaril for anxiety  she rated anxiety a 4/10 ( 0 low - 10 high )

## 2023-01-14 NOTE — Group Note (Signed)
Date:  01/14/2023 Time:  4:24 PM  Group Topic/Focus:  OUTDOOR RECREATION ACTIVITY    Participation Level:  Did Not Attend   Natalie Rose 01/14/2023, 4:24 PM

## 2023-01-14 NOTE — Group Note (Signed)
Recreation Therapy Group Note   Group Topic:General Recreation  Group Date: 01/14/2023 Start Time: 1015 End Time: 1120 Facilitators: Rosina Lowenstein, LRT, CTRS Location: Courtyard  Group Description: Outdoor Recreation. Patients had the option to play basketball, play with a deck of cards or sit and listen to music while outside in the courtyard getting fresh air and sunlight. LRT and pts discussed things that they enjoy doing in their free time outside of the hospital.   Goal Area(s) Addressed: Patient will identify leisure interests.  Patient will practice healthy decision making. Patient will engage in recreation activity.   Affect/Mood: Appropriate and Flat   Participation Level: Active and Engaged   Participation Quality: Independent   Behavior: Appropriate, Calm, and Cooperative   Speech/Thought Process: Coherent   Insight: Good   Judgement: Good   Modes of Intervention: Activity   Patient Response to Interventions:  Attentive, Engaged, and Receptive   Education Outcome:  Acknowledges education   Clinical Observations/Individualized Feedback: [name] was active in their participation of session activities and group discussion. Pt chose to play basketball and talk with LRT and peers while outside. Pt shared that she enjoys country music. Pt interacted well with LRT and peers duration of session.   Plan: Continue to engage patient in RT group sessions 2-3x/week.   Rosina Lowenstein, LRT, CTRS 01/14/2023 11:55 AM

## 2023-01-14 NOTE — Group Note (Signed)
Date:  01/14/2023 Time:  10:10 AM  Group Topic/Focus:  Dimensions of Wellness:   The focus of this group is to introduce the topic of wellness and discuss the role each dimension of wellness plays in total health.    Participation Level:  Active  Participation Quality:  Appropriate  Affect:  Appropriate  Cognitive:  Alert and Appropriate  Insight: Appropriate  Engagement in Group:  Developing/Improving and Supportive  Modes of Intervention:  Activity and Socialization  Additional Comments:    Modesta Sammons 01/14/2023, 10:10 AM

## 2023-01-14 NOTE — Progress Notes (Signed)
Patient presents pleasant and cooperative. Complaints of insomnia without trazodone. Noted in dayroom socializing appropriately with peers. Denies SI, Hi, AVh. Did attend group and eat snack this evening.  Encouragement and support provided. Safety checks maintained. Meds given as prescribed. Pt receptive and remains safe on unit with q 15 min checks.

## 2023-01-14 NOTE — Group Note (Signed)
[facility] LCSW Group Therapy Note    Group Date: 01/14/2023 Start Time: 1310 End Time: 1410  Type of Therapy and Topic:  Group Therapy:  Overcoming Obstacles  Participation Level:  BHH PARTICIPATION LEVEL: Minimal   Description of Group:   In this group patients will be encouraged to explore what they see as obstacles to their own wellness and recovery. They will be guided to discuss their thoughts, feelings, and behaviors related to these obstacles. The group will process together ways to cope with barriers, with attention given to specific choices patients can make. Each patient will be challenged to identify changes they are motivated to make in order to overcome their obstacles. This group will be process-oriented, with patients participating in exploration of their own experiences as well as giving and receiving support and challenge from other group members.  Therapeutic Goals: 1. Patient will identify personal and current obstacles as they relate to admission. 2. Patient will identify barriers that currently interfere with their wellness or overcoming obstacles.  3. Patient will identify feelings, thought process and behaviors related to these barriers. 4. Patient will identify two changes they are willing to make to overcome these obstacles:    Summary of Patient Progress Patient came into the group towards the end, however, she was still involved in the discussion. She spoke about her anxiety and it's difficulties. Her comments were pertinent to the discussion and her behavior was appropriate although her mood appeared to dysphoric and anxious. Pt appears to have slight insight into the topic. She appeared open and receptive to feedback/comments from both her peers and the facilitator.   Therapeutic Modalities:   Cognitive Behavioral Therapy Solution Focused Therapy Motivational Interviewing Relapse Prevention Therapy   Glenis Smoker,  LCSW

## 2023-01-14 NOTE — Plan of Care (Signed)
D- Patient alert and oriented. Affect is restricted,somewhat guarded Denies SI, HI, AVH, and pain. A- Scheduled medications administered to patient, per MD orders. Support and encouragement provided.  Routine safety checks conducted every 15 minutes.  Patient informed to notify staff with problems or concerns. R- No adverse drug reactions noted. Patient contracts for safety at this time. Patient compliant with medications and treatment plan. Patient receptive, calm, and cooperative. Patient interacts well with others on the unit.  Patient remains safe at this time.

## 2023-01-14 NOTE — BH IP Treatment Plan (Signed)
Interdisciplinary Treatment and Diagnostic Plan Update  01/14/2023 Time of Session: 9:30AM Leniya Fregoso MRN: [id number]  Principal Diagnosis: Severe episode of recurrent major depressive disorder, without psychotic features (HCC)  Secondary Diagnoses: Principal Problem:   Severe episode of recurrent major depressive disorder, without psychotic features (HCC) Active Problems:   Borderline personality disorder (HCC)   PTSD (post-traumatic stress disorder)   Intentional drug overdose (HCC)   Current Medications:  Current Facility-Administered Medications  Medication Dose Route Frequency Provider Last Rate Last Admin   acetaminophen (TYLENOL) tablet 650 mg  650 mg Oral Q6H PRN Ajibola, Ene A, NP   650 mg at 01/13/23 0817   alum & mag hydroxide-simeth (MAALOX/MYLANTA) 200-200-20 MG/5ML suspension 30 mL  30 mL Oral Q4H PRN Ajibola, Ene A, NP       atenolol (TENORMIN) tablet 50 mg  50 mg Oral Daily Charm Rings, NP   50 mg at 01/14/23 0908   clonazePAM (KLONOPIN) tablet 0.5 mg  0.5 mg Oral BID Charm Rings, NP   0.5 mg at 01/14/23 7846   diphenhydrAMINE (BENADRYL) capsule 50 mg  50 mg Oral TID PRN Ajibola, Ene A, NP   50 mg at 01/12/23 0105   Or   diphenhydrAMINE (BENADRYL) injection 50 mg  50 mg Intramuscular TID PRN Ajibola, Ene A, NP       gabapentin (NEURONTIN) capsule 300 mg  300 mg Oral TID Charm Rings, NP   300 mg at 01/14/23 1318   haloperidol (HALDOL) tablet 5 mg  5 mg Oral TID PRN Ajibola, Ene A, NP       Or   haloperidol lactate (HALDOL) injection 5 mg  5 mg Intramuscular TID PRN Ajibola, Ene A, NP       magnesium hydroxide (MILK OF MAGNESIA) suspension 30 mL  30 mL Oral Daily PRN Ajibola, Ene A, NP       metFORMIN (GLUCOPHAGE) tablet 500 mg  500 mg Oral BID WC Charm Rings, NP   500 mg at 01/14/23 0906   mirtazapine (REMERON) tablet 45 mg  45 mg Oral QHS Charm Rings, NP   45 mg at 01/13/23 2144   nicotine (NICODERM CQ - dosed in mg/24 hours) patch 21  mg  21 mg Transdermal Q24H Charm Rings, NP       OLANZapine-Samidorphan 10-10 MG TABS 1 tablet  1 tablet Oral Daily Charm Rings, NP   1 tablet at 01/14/23 1106   ondansetron (ZOFRAN) tablet 4 mg  4 mg Oral Daily PRN Charm Rings, NP       prazosin (MINIPRESS) capsule 2 mg  2 mg Oral QHS Charm Rings, NP   2 mg at 01/13/23 2145   propranolol (INDERAL) tablet 10 mg  10 mg Oral TID Charm Rings, NP   10 mg at 01/14/23 1319   QUEtiapine (SEROQUEL) tablet 50 mg  50 mg Oral TID Charm Rings, NP   50 mg at 01/13/23 1731   sertraline (ZOLOFT) tablet 150 mg  150 mg Oral Daily Charm Rings, NP   150 mg at 01/14/23 9629   traZODone (DESYREL) tablet 100 mg  100 mg Oral QHS Charm Rings, NP   100 mg at 01/13/23 2144   PTA Medications: Medications Prior to Admission  Medication Sig Dispense Refill Last Dose   atenolol (TENORMIN) 50 MG tablet Take 1 tablet (50 mg total) by mouth daily. 30 tablet 1    clonazePAM (KLONOPIN) 0.5 MG tablet  Take 1 tablet (0.5 mg total) by mouth 2 (two) times daily. 60 tablet 1    gabapentin (NEURONTIN) 300 MG capsule Take 1 capsule (300 mg total) by mouth 3 (three) times daily. 90 capsule 1    LYBALVI 10-10 MG TABS Take 1 tablet by mouth daily.      metFORMIN (GLUCOPHAGE) 500 MG tablet Take 1 tablet (500 mg total) by mouth 2 (two) times daily with a meal. 60 tablet 1    mirtazapine (REMERON) 45 MG tablet Take 1 tablet (45 mg total) by mouth at bedtime. 30 tablet 1    OLANZapine (ZYPREXA) 10 MG tablet Take 1 tablet (10 mg total) by mouth at bedtime. 30 tablet 1    ondansetron (ZOFRAN) 4 MG tablet Take 1 tablet (4 mg total) by mouth daily as needed for nausea or vomiting. 30 tablet 1    prazosin (MINIPRESS) 2 MG capsule Take 2 capsules (4 mg total) by mouth at bedtime. 60 capsule 1    prazosin (MINIPRESS) 5 MG capsule Take 5 mg by mouth at bedtime.      propranolol (INDERAL) 10 MG tablet Take 1 tablet (10 mg total) by mouth 3 (three) times daily. 90 tablet  1    QUEtiapine (SEROQUEL) 50 MG tablet Take 1 tablet (50 mg total) by mouth 2 (two) times daily. 60 tablet 1    sertraline (ZOLOFT) 50 MG tablet Take 3 tablets (150 mg total) by mouth daily. 90 tablet 1    traZODone (DESYREL) 100 MG tablet Take 1 tablet (100 mg total) by mouth at bedtime. 30 tablet 1     Patient Stressors: Financial difficulties   Medication change or noncompliance    Patient Strengths: Motivation for treatment/growth  Supportive family/friends   Treatment Modalities: Medication Management, Group therapy, Case management,  1 to 1 session with clinician, Psychoeducation, Recreational therapy.   Physician Treatment Plan for Primary Diagnosis: Severe episode of recurrent major depressive disorder, without psychotic features (HCC) Long Term Goal(s): Improvement in symptoms so as ready for discharge   Short Term Goals: Ability to identify changes in lifestyle to reduce recurrence of condition will improve Ability to verbalize feelings will improve Ability to disclose and discuss suicidal ideas Ability to demonstrate self-control will improve Ability to identify and develop effective coping behaviors will improve Ability to maintain clinical measurements within normal limits will improve Compliance with prescribed medications will improve Ability to identify triggers associated with substance abuse/mental health issues will improve  Medication Management: Evaluate patient's response, side effects, and tolerance of medication regimen.  Therapeutic Interventions: 1 to 1 sessions, Unit Group sessions and Medication administration.  Evaluation of Outcomes: Not Met  Physician Treatment Plan for Secondary Diagnosis: Principal Problem:   Severe episode of recurrent major depressive disorder, without psychotic features (HCC) Active Problems:   Borderline personality disorder (HCC)   PTSD (post-traumatic stress disorder)   Intentional drug overdose (HCC)  Long Term  Goal(s): Improvement in symptoms so as ready for discharge   Short Term Goals: Ability to identify changes in lifestyle to reduce recurrence of condition will improve Ability to verbalize feelings will improve Ability to disclose and discuss suicidal ideas Ability to demonstrate self-control will improve Ability to identify and develop effective coping behaviors will improve Ability to maintain clinical measurements within normal limits will improve Compliance with prescribed medications will improve Ability to identify triggers associated with substance abuse/mental health issues will improve     Medication Management: Evaluate patient's response, side effects, and tolerance of medication  regimen.  Therapeutic Interventions: 1 to 1 sessions, Unit Group sessions and Medication administration.  Evaluation of Outcomes: Not Met   RN Treatment Plan for Primary Diagnosis: Severe episode of recurrent major depressive disorder, without psychotic features (HCC) Long Term Goal(s): Knowledge of disease and therapeutic regimen to maintain health will improve  Short Term Goals: Ability to demonstrate self-control, Ability to participate in decision making will improve, Ability to verbalize feelings will improve, Ability to disclose and discuss suicidal ideas, Ability to identify and develop effective coping behaviors will improve, and Compliance with prescribed medications will improve  Medication Management: RN will administer medications as ordered by provider, will assess and evaluate patient's response and provide education to patient for prescribed medication. RN will report any adverse and/or side effects to prescribing provider.  Therapeutic Interventions: 1 on 1 counseling sessions, Psychoeducation, Medication administration, Evaluate responses to treatment, Monitor vital signs and CBGs as ordered, Perform/monitor CIWA, COWS, AIMS and Fall Risk screenings as ordered, Perform wound care  treatments as ordered.  Evaluation of Outcomes: Not Met   LCSW Treatment Plan for Primary Diagnosis: Severe episode of recurrent major depressive disorder, without psychotic features (HCC) Long Term Goal(s): Safe transition to appropriate next level of care at discharge, Engage patient in therapeutic group addressing interpersonal concerns.  Short Term Goals: Engage patient in aftercare planning with referrals and resources, Increase social support, Increase ability to appropriately verbalize feelings, Increase emotional regulation, Facilitate acceptance of mental health diagnosis and concerns, and Increase skills for wellness and recovery  Therapeutic Interventions: Assess for all discharge needs, 1 to 1 time with Social worker, Explore available resources and support systems, Assess for adequacy in community support network, Educate family and significant other(s) on suicide prevention, Complete Psychosocial Assessment, Interpersonal group therapy.  Evaluation of Outcomes: Not Met   Progress in Treatment: Attending groups: Yes. Participating in groups: Yes. Taking medication as prescribed: Yes. Toleration medication: Yes. Family/Significant other contact made: Yes, individual(s) contacted:  once permission is given. Patient understands diagnosis: Yes. Discussing patient identified problems/goals with staff: Yes. Medical problems stabilized or resolved: Yes. Denies suicidal/homicidal ideation: Yes. Issues/concerns per patient self-inventory: No. Other: none  New problem(s) identified: No, Describe:  none  New Short Term/Long Term Goal(s): detox, elimination of symptoms of psychosis, medication management for mood stabilization; elimination of SI thoughts; development of comprehensive mental wellness/sobriety plan.   Patient Goals:  "try to get these panic attacks under control and these recurring nightmares  Discharge Plan or Barriers: CSW to assist in the development of  appropriate discharge plans.   Reason for Continuation of Hospitalization: Anxiety Depression Medical Issues Medication stabilization Suicidal ideation  Estimated Length of Stay: 1-7 days  Last 3 [place] Suicide Severity Risk Score: Flowsheet Row Admission (Current) from 01/12/2023 in [facility] INPATIENT BE[facility] ED from 01/11/2023 in [facility] Emergency Department at [facility] ED from 01/10/2023 in [facility]  C-SSRS RISK CATEGORY No Risk No Risk No Risk       Last PHQ 2/9 Scores:     No data to display          Scribe for Treatment Team: Harden Mo, LCSW 01/14/2023 1:44 PM

## 2023-01-14 NOTE — Group Note (Unsigned)
Date:  01/14/2023 Time:  11:08 PM  Group Topic/Focus:  Wrap-Up Group:   The focus of this group is to help patients review their daily goal of treatment and discuss progress on daily workbooks.     Participation Level:  {BHH PARTICIPATION XBMWU:13244}  Participation Quality:  {BHH PARTICIPATION QUALITY:22265}  Affect:  {BHH AFFECT:22266}  Cognitive:  {BHH COGNITIVE:22267}  Insight: {BHH Insight2:20797}  Engagement in Group:  {BHH ENGAGEMENT IN WNUUV:25366}  Modes of Intervention:  {BHH MODES OF INTERVENTION:22269}  Additional Comments:  ***  Dushaun Okey 01/14/2023, 11:08 PM

## 2023-01-14 NOTE — Group Note (Signed)
Date:  01/14/2023 Time:  6:14 AM  Group Topic/Focus:  Wrap-Up Group:   The focus of this group is to help patients review their daily goal of treatment and discuss progress on daily workbooks.    Participation Level:  Active  Participation Quality:  Appropriate and Attentive  Affect:  Appropriate  Cognitive:  Alert and Appropriate  Insight: Appropriate and Good  Engagement in Group:  Engaged  Modes of Intervention:  Activity  Additional Comments:     Maglione,Bleu Minerd E 01/14/2023, 6:14 AM

## 2023-01-14 NOTE — Progress Notes (Signed)
[facility] MD Progress Note  01/14/2023 3:26 PM [place] Lorden  MRN:  [id number]  Subjective:   Notes, vital signs, and labs reviewed prior to the assessment.  The client was socializing appropriately in the milieu with peers and staff.  Her depression is low at 2/10 with 10 being the highest, no suicidal ideations.  Anxiety "not really" yet complains of nightmares that triggered a panic attack this morning.  Sleep is "pretty good".  She is interested in therapy in the outpatient world, psych provider in place.  During the treatment team she stated her goals were to get her panic attacks under control and reoccurring nightmares.  She is on Prazosin already, held at times based on her BP.  Discussed therapy to assist with nightmares and discharging later this week.  Principal Problem: Severe episode of recurrent major depressive disorder, without psychotic features (HCC) Diagnosis: Principal Problem:   Severe episode of recurrent major depressive disorder, without psychotic features (HCC) Active Problems:   Borderline personality disorder (HCC)   PTSD (post-traumatic stress disorder)   Intentional drug overdose (HCC)  Total Time spent with patient: 30 minutes  Past Psychiatric History: PTSD, depression, BPD  Past Medical History:  Past Medical History:  Diagnosis Date   Anxiety    Arthritis    Depression    Diabetes mellitus without complication (HCC)    Mental health disorder    Migraines     Past Surgical History:  Procedure Laterality Date   MOUTH SURGERY     Family History:  Family History  Problem Relation Age of Onset   Osteopenia Mother    Heart failure Mother    Osteoarthritis Mother    Depression Mother    Diabetes Father    Family Psychiatric  History: see above Social History:  Social History   Substance and Sexual Activity  Alcohol Use Not Currently     Social History   Substance and Sexual Activity  Drug Use Yes   Frequency: 3.0 times per week    Types: Marijuana    Social History   Socioeconomic History   Marital status: Single    Spouse name: Not on file   Number of children: Not on file   Years of education: Not on file   Highest education level: Not on file  Occupational History   Not on file  Tobacco Use   Smoking status: Every Day    Current packs/day: 1.00    Average packs/day: 1 pack/day for 20.6 years (20.6 ttl pk-yrs)    Types: Cigarettes    Start date: 2004   Smokeless tobacco: Never  Vaping Use   Vaping status: Never Used  Substance and Sexual Activity   Alcohol use: Not Currently   Drug use: Yes    Frequency: 3.0 times per week    Types: Marijuana   Sexual activity: Not Currently    Birth control/protection: None  Other Topics Concern   Not on file  Social History Narrative   Not on file   Social Determinants of Health   Financial Resource Strain: Medium Risk (03/08/2020)   Received from [facility], [facility]   Overall Financial Resource Strain (CARDIA)    Difficulty of Paying Living Expenses: Somewhat hard  Food Insecurity: No Food Insecurity (01/12/2023)   Hunger Vital Sign    Worried About Running Out of Food in the Last Year: Never true    Ran Out of Food in the Last Year: Never true  Transportation Needs: No  Transportation Needs (01/12/2023)   PRAPARE - Administrator, Civil Service (Medical): No    Lack of Transportation (Non-Medical): No  Physical Activity: Insufficiently Active (05/02/2021)   Received from [facility], [facility]   Exercise Vital Sign    Days of Exercise per Week: 4 days    Minutes of Exercise per Session: 30 min  Stress: Not on file  Social Connections: Not on file   Additional Social History: lives along with her mother and sister nearby    Sleep: Good  Appetite:  Fair  Current Medications: Current Facility-Administered Medications  Medication Dose Route Frequency Provider Last Rate Last Admin   acetaminophen (TYLENOL) tablet  650 mg  650 mg Oral Q6H PRN Ajibola, Ene A, NP   650 mg at 01/13/23 0817   alum & mag hydroxide-simeth (MAALOX/MYLANTA) 200-200-20 MG/5ML suspension 30 mL  30 mL Oral Q4H PRN Ajibola, Ene A, NP       atenolol (TENORMIN) tablet 50 mg  50 mg Oral Daily Charm Rings, NP   50 mg at 01/14/23 0908   clonazePAM (KLONOPIN) tablet 0.5 mg  0.5 mg Oral BID Charm Rings, NP   0.5 mg at 01/14/23 1308   diphenhydrAMINE (BENADRYL) capsule 50 mg  50 mg Oral TID PRN Ajibola, Ene A, NP   50 mg at 01/12/23 0105   Or   diphenhydrAMINE (BENADRYL) injection 50 mg  50 mg Intramuscular TID PRN Ajibola, Ene A, NP       gabapentin (NEURONTIN) capsule 300 mg  300 mg Oral TID Charm Rings, NP   300 mg at 01/14/23 1318   haloperidol (HALDOL) tablet 5 mg  5 mg Oral TID PRN Ajibola, Ene A, NP       Or   haloperidol lactate (HALDOL) injection 5 mg  5 mg Intramuscular TID PRN Ajibola, Ene A, NP       magnesium hydroxide (MILK OF MAGNESIA) suspension 30 mL  30 mL Oral Daily PRN Ajibola, Ene A, NP       metFORMIN (GLUCOPHAGE) tablet 500 mg  500 mg Oral BID WC Charm Rings, NP   500 mg at 01/14/23 0906   mirtazapine (REMERON) tablet 45 mg  45 mg Oral QHS Charm Rings, NP   45 mg at 01/13/23 2144   nicotine (NICODERM CQ - dosed in mg/24 hours) patch 21 mg  21 mg Transdermal Q24H Charm Rings, NP       OLANZapine-Samidorphan 10-10 MG TABS 1 tablet  1 tablet Oral Daily Charm Rings, NP   1 tablet at 01/14/23 1106   ondansetron (ZOFRAN) tablet 4 mg  4 mg Oral Daily PRN Charm Rings, NP       prazosin (MINIPRESS) capsule 2 mg  2 mg Oral QHS Charm Rings, NP   2 mg at 01/13/23 2145   propranolol (INDERAL) tablet 10 mg  10 mg Oral TID Charm Rings, NP   10 mg at 01/14/23 1319   QUEtiapine (SEROQUEL) tablet 50 mg  50 mg Oral TID Charm Rings, NP   50 mg at 01/13/23 1731   sertraline (ZOLOFT) tablet 150 mg  150 mg Oral Daily Charm Rings, NP   150 mg at 01/14/23 6578   traZODone (DESYREL) tablet 100 mg   100 mg Oral QHS Charm Rings, NP   100 mg at 01/13/23 2144    Lab Results:  No results found for this or any previous  visit (from the past 48 hour(s)).   Blood Alcohol level:  Lab Results  Component Value Date   ETH <10 01/11/2023   ETH <10 09/14/2022    Metabolic Disorder Labs: Lab Results  Component Value Date   HGBA1C 6.4 (H) 01/11/2023   MPG 137 01/11/2023   MPG 131.24 09/17/2022   No results found for: "PROLACTIN" Lab Results  Component Value Date   CHOL 270 (H) 01/11/2023   TRIG 140 01/11/2023   HDL 46 01/11/2023   CHOLHDL 5.9 01/11/2023   VLDL 28 01/11/2023   LDLCALC 196 (H) 01/11/2023   LDLCALC 146 (H) 09/17/2022    Musculoskeletal: Strength & Muscle Tone: within normal limits Gait & Station: normal Patient leans: N/A  Psychiatric Specialty Exam: Physical Exam Vitals and nursing note reviewed.  Constitutional:      Appearance: Normal appearance.  HENT:     Head: Normocephalic.     Nose: Nose normal.  Pulmonary:     Effort: Pulmonary effort is normal.  Musculoskeletal:        General: Normal range of motion.     Cervical back: Normal range of motion.  Neurological:     General: No focal deficit present.     Mental Status: She is alert and oriented to person, place, and time.  Psychiatric:        Attention and Perception: Attention and perception normal.        Mood and Affect: Mood is depressed.        Speech: Speech normal.        Behavior: Behavior normal. Behavior is cooperative.        Thought Content: Thought content normal.        Cognition and Memory: Cognition and memory normal.        Judgment: Judgment is impulsive.     Review of Systems  Neurological:  Positive for headaches.  Psychiatric/Behavioral:  Positive for depression.   All other systems reviewed and are negative.   Blood pressure 101/63, pulse 68, temperature 98.6 F (37 C), temperature source Oral, resp. rate 20, height 4\' 11"  (1.499 m), weight 80.7 kg, last  menstrual period 01/02/2023, SpO2 93%.Body mass index is 35.95 kg/m.  General Appearance: Casual  Eye Contact:  Fair  Speech:  Normal Rate  Volume:  Normal  Mood:  Depressed  Affect:  Blunt  Thought Process:  Coherent  Orientation:  Full (Time, Place, and Person)  Thought Content:  Rumination  Suicidal Thoughts:  No  Homicidal Thoughts:  No  Memory:  Immediate;   Fair Recent;   Fair Remote;   Fair  Judgement:  Fair  Insight:  Fair  Psychomotor Activity:  Normal  Concentration:  Concentration: Fair and Attention Span: Fair  Recall:  [employer] of Knowledge:  Fair  Language:  Good  Akathisia:  No  Handed:  Right  AIMS (if indicated):     Assets:  Housing Leisure Time Physical Health Resilience Social Support  ADL's:  Intact  Cognition:  WNL  Sleep:         Physical Exam: Physical Exam Vitals and nursing note reviewed.  Constitutional:      Appearance: Normal appearance.  HENT:     Head: Normocephalic.     Nose: Nose normal.  Pulmonary:     Effort: Pulmonary effort is normal.  Musculoskeletal:        General: Normal range of motion.     Cervical back: Normal range of motion.  Neurological:  General: No focal deficit present.     Mental Status: She is alert and oriented to person, place, and time.  Psychiatric:        Attention and Perception: Attention and perception normal.        Mood and Affect: Mood is depressed.        Speech: Speech normal.        Behavior: Behavior normal. Behavior is cooperative.        Thought Content: Thought content normal.        Cognition and Memory: Cognition and memory normal.        Judgment: Judgment is impulsive.    Review of Systems  Neurological:  Positive for headaches.  Psychiatric/Behavioral:  Positive for depression.   All other systems reviewed and are negative.  Blood pressure 101/63, pulse 68, temperature 98.6 F (37 C), temperature source Oral, resp. rate 20, height 4\' 11"  (1.499 m), weight 80.7 kg, last  menstrual period 01/02/2023, SpO2 93%. Body mass index is 35.95 kg/m.   Treatment Plan Summary: Daily contact with patient to assess and evaluate symptoms and progress in treatment, Medication management, and Plan : Major depressive disorder, recurrent, severe without psychosis: Zoloft 150 mg daily Lybalvi 10-10 daily A1C awaiting results, TSH WDL, and lipid panel with cholesterol 270 H and LDL 196 H; EKG completed in ED with QT interval WDL   Anxiety: Seroquel 50 mg TID per her provider at [facility] Gabapentin 300 mg TID Klonopin 0.5 mg BID   Insomnia Trazodone 100 mg at bedtime Remeron 45 mg at bedtime   Nightmares: Prazosin 6 mg daily  Nanine Means, NP 01/14/2023, 3:26 PM

## 2023-01-14 NOTE — Group Note (Signed)
Date:  01/14/2023 Time:  11:01 PM  Group Topic/Focus:  Wrap-Up Group:   The focus of this group is to help patients review their daily goal of treatment and discuss progress on daily workbooks.    Participation Level:  Active  Participation Quality:  Appropriate and Attentive  Affect:  Appropriate  Cognitive:  Alert and Appropriate  Insight: Good  Engagement in Group:  Developing/Improving  Modes of Intervention:  Clarification, Discussion, and Education  Additional Comments:     Laron Boorman 01/14/2023, 11:01 PM

## 2023-01-14 NOTE — Group Note (Unsigned)
Date:  01/14/2023 Time:  10:53 PM  Group Topic/Focus:  Wrap-Up Group:   The focus of this group is to help patients review their daily goal of treatment and discuss progress on daily workbooks.     Participation Level:  {BHH PARTICIPATION ZOXWR:60454}  Participation Quality:  {BHH PARTICIPATION QUALITY:22265}  Affect:  {BHH AFFECT:22266}  Cognitive:  {BHH COGNITIVE:22267}  Insight: {BHH Insight2:20797}  Engagement in Group:  {BHH ENGAGEMENT IN UJWJX:91478}  Modes of Intervention:  {BHH MODES OF INTERVENTION:22269}  Additional Comments:  ***  Kion Huntsberry 01/14/2023, 10:53 PM

## 2023-01-14 NOTE — Progress Notes (Signed)
Patient alert and oriented x 4, affect is brighter, her thoughts are organized and coherent , speech is soft , non pressured,, she appears less anxious interacting appropriately with peers and staff no distress noted. Patient denies SI/HI/AVH and contacts for safety. Patient's propanolol was withheld because of her low blood pressure. 15 minutes safety checks maintained will continue to monitor.

## 2023-01-14 NOTE — Plan of Care (Signed)

## 2023-01-14 NOTE — Group Note (Unsigned)
Date:  01/14/2023 Time:  10:59 PM  Group Topic/Focus:  Wrap-Up Group:   The focus of this group is to help patients review their daily goal of treatment and discuss progress on daily workbooks.     Participation Level:  {BHH PARTICIPATION ZOXWR:60454}  Participation Quality:  {BHH PARTICIPATION QUALITY:22265}  Affect:  {BHH AFFECT:22266}  Cognitive:  {BHH COGNITIVE:22267}  Insight: {BHH Insight2:20797}  Engagement in Group:  {BHH ENGAGEMENT IN UJWJX:91478}  Modes of Intervention:  {BHH MODES OF INTERVENTION:22269}  Additional Comments:  ***  Kritika Stukes 01/14/2023, 10:59 PM

## 2023-01-15 ENCOUNTER — Other Ambulatory Visit: Payer: Self-pay | Admitting: Psychiatry

## 2023-01-15 DIAGNOSIS — F39 Unspecified mood [affective] disorder: Secondary | ICD-10-CM | POA: Insufficient documentation

## 2023-01-15 DIAGNOSIS — F332 Major depressive disorder, recurrent severe without psychotic features: Secondary | ICD-10-CM | POA: Diagnosis not present

## 2023-01-15 MED ORDER — PRAZOSIN HCL 2 MG PO CAPS
5.0000 mg | ORAL_CAPSULE | Freq: Every day | ORAL | Status: DC
  Administered 2023-01-15: 5 mg via ORAL
  Filled 2023-01-15: qty 1

## 2023-01-15 MED ORDER — QUETIAPINE FUMARATE 25 MG PO TABS
50.0000 mg | ORAL_TABLET | Freq: Every day | ORAL | Status: DC
  Administered 2023-01-15: 50 mg via ORAL
  Filled 2023-01-15: qty 2

## 2023-01-15 NOTE — Progress Notes (Signed)
Patient calm and pleasant during assessment denying SI/HI/AVH. Pt observed interacting appropriately with staff and peers on the unit. Pt compliant with medication administration per MD orders. Pt given education, support, and encouragement to be active in her treatment plan. Pt being monitored Q 15 minutes for safety per unit protocol, remains safe on the unit   

## 2023-01-15 NOTE — Progress Notes (Signed)
[facility] MD Progress Note  01/15/2023 5:54 PM Natalie Rose  MRN:  [id number]  Subjective:   Notes, vital signs, and labs reviewed prior to the assessment. She requested her Seroquel be moved to only at night as it is making her too sleepy during the day, changed made.  3/10 depression with 10 being the highest, no suicidal ideations.  Anxiety is "only high in the morning after the nightmares" which are better with the higher dose of Prazosin.  "I sleep pretty good."  Appetite is "ok".  No other side effects from her medications besides the sleepiness.  She feels ready to discharge tomorrow and desires to add therapy to her treatment plan after discharge.  Principal Problem: Mood disorder Diagnosis: Principal Problem:   Severe episode of recurrent major depressive disorder, without psychotic features (HCC) Active Problems:   Borderline personality disorder (HCC)   PTSD (post-traumatic stress disorder)   Intentional drug overdose (HCC)  Total Time spent with patient: 30 minutes  Past Psychiatric History: PTSD, depression, BPD  Past Medical History:  Past Medical History:  Diagnosis Date   Anxiety    Arthritis    Depression    Diabetes mellitus without complication (HCC)    Mental health disorder    Migraines     Past Surgical History:  Procedure Laterality Date   MOUTH SURGERY     Family History:  Family History  Problem Relation Age of Onset   Osteopenia Mother    Heart failure Mother    Osteoarthritis Mother    Depression Mother    Diabetes Father    Family Psychiatric  History: see above Social History:  Social History   Substance and Sexual Activity  Alcohol Use Not Currently     Social History   Substance and Sexual Activity  Drug Use Yes   Frequency: 3.0 times per week   Types: Marijuana    Social History   Socioeconomic History   Marital status: Single    Spouse name: Not on file   Number of children: Not on file   Years of education: Not on  file   Highest education level: Not on file  Occupational History   Not on file  Tobacco Use   Smoking status: Every Day    Current packs/day: 1.00    Average packs/day: 1 pack/day for 20.6 years (20.6 ttl pk-yrs)    Types: Cigarettes    Start date: 2004   Smokeless tobacco: Never  Vaping Use   Vaping status: Never Used  Substance and Sexual Activity   Alcohol use: Not Currently   Drug use: Yes    Frequency: 3.0 times per week    Types: Marijuana   Sexual activity: Not Currently    Birth control/protection: None  Other Topics Concern   Not on file  Social History Narrative   Not on file   Social Determinants of Health   Financial Resource Strain: Medium Risk (03/08/2020)   Received from [facility], [facility]   Overall Financial Resource Strain (CARDIA)    Difficulty of Paying Living Expenses: Somewhat hard  Food Insecurity: No Food Insecurity (01/12/2023)   Hunger Vital Sign    Worried About Running Out of Food in the Last Year: Never true    Ran Out of Food in the Last Year: Never true  Transportation Needs: No Transportation Needs (01/12/2023)   PRAPARE - Administrator, Civil Service (Medical): No    Lack of Transportation (Non-Medical): No  Physical Activity: Insufficiently Active (05/02/2021)   Received from [facility], [facility]   Exercise Vital Sign    Days of Exercise per Week: 4 days    Minutes of Exercise per Session: 30 min  Stress: Not on file  Social Connections: Not on file   Additional Social History: lives along with her mother and sister nearby    Sleep: Good  Appetite:  Fair  Current Medications: Current Facility-Administered Medications  Medication Dose Route Frequency Provider Last Rate Last Admin   acetaminophen (TYLENOL) tablet 650 mg  650 mg Oral Q6H PRN Ajibola, Ene A, NP   650 mg at 01/13/23 0817   alum & mag hydroxide-simeth (MAALOX/MYLANTA) 200-200-20 MG/5ML suspension 30 mL  30 mL Oral Q4H PRN Ajibola,  Ene A, NP       atenolol (TENORMIN) tablet 50 mg  50 mg Oral Daily Charm Rings, NP   50 mg at 01/15/23 0830   clonazePAM (KLONOPIN) tablet 0.5 mg  0.5 mg Oral BID Charm Rings, NP   0.5 mg at 01/15/23 1704   diphenhydrAMINE (BENADRYL) capsule 50 mg  50 mg Oral TID PRN Ajibola, Ene A, NP   50 mg at 01/12/23 0105   Or   diphenhydrAMINE (BENADRYL) injection 50 mg  50 mg Intramuscular TID PRN Ajibola, Ene A, NP       gabapentin (NEURONTIN) capsule 300 mg  300 mg Oral TID Charm Rings, NP   300 mg at 01/15/23 1201   haloperidol (HALDOL) tablet 5 mg  5 mg Oral TID PRN Ajibola, Ene A, NP       Or   haloperidol lactate (HALDOL) injection 5 mg  5 mg Intramuscular TID PRN Ajibola, Ene A, NP       magnesium hydroxide (MILK OF MAGNESIA) suspension 30 mL  30 mL Oral Daily PRN Ajibola, Ene A, NP       metFORMIN (GLUCOPHAGE) tablet 500 mg  500 mg Oral BID WC Charm Rings, NP   500 mg at 01/15/23 1704   mirtazapine (REMERON) tablet 45 mg  45 mg Oral QHS Charm Rings, NP   45 mg at 01/14/23 2117   nicotine (NICODERM CQ - dosed in mg/24 hours) patch 21 mg  21 mg Transdermal Q24H Charm Rings, NP       OLANZapine-Samidorphan 10-10 MG TABS 1 tablet  1 tablet Oral Daily Charm Rings, NP   1 tablet at 01/15/23 1042   ondansetron (ZOFRAN) tablet 4 mg  4 mg Oral Daily PRN Charm Rings, NP       prazosin (MINIPRESS) capsule 2 mg  2 mg Oral QHS Charm Rings, NP   2 mg at 01/14/23 2117   propranolol (INDERAL) tablet 10 mg  10 mg Oral TID Charm Rings, NP   10 mg at 01/15/23 1202   QUEtiapine (SEROQUEL) tablet 50 mg  50 mg Oral TID Charm Rings, NP   50 mg at 01/14/23 2122   sertraline (ZOLOFT) tablet 150 mg  150 mg Oral Daily Charm Rings, NP   150 mg at 01/15/23 0830   traZODone (DESYREL) tablet 100 mg  100 mg Oral QHS Charm Rings, NP   100 mg at 01/14/23 2122    Lab Results:  No results found for this or any previous visit (from the past 48 hour(s)).   Blood Alcohol level:   Lab Results  Component Value Date   ETH <10 01/11/2023  ETH <10 09/14/2022    Metabolic Disorder Labs: Lab Results  Component Value Date   HGBA1C 6.4 (H) 01/11/2023   MPG 137 01/11/2023   MPG 131.24 09/17/2022   No results found for: "PROLACTIN" Lab Results  Component Value Date   CHOL 270 (H) 01/11/2023   TRIG 140 01/11/2023   HDL 46 01/11/2023   CHOLHDL 5.9 01/11/2023   VLDL 28 01/11/2023   LDLCALC 196 (H) 01/11/2023   LDLCALC 146 (H) 09/17/2022    Musculoskeletal: Strength & Muscle Tone: within normal limits Gait & Station: normal Patient leans: N/A  Psychiatric Specialty Exam: Physical Exam Vitals and nursing note reviewed.  Constitutional:      Appearance: Normal appearance.  HENT:     Head: Normocephalic.     Nose: Nose normal.  Pulmonary:     Effort: Pulmonary effort is normal.  Musculoskeletal:        General: Normal range of motion.     Cervical back: Normal range of motion.  Neurological:     General: No focal deficit present.     Mental Status: She is alert and oriented to person, place, and time.  Psychiatric:        Attention and Perception: Attention and perception normal.        Mood and Affect: Mood is depressed.        Speech: Speech normal.        Behavior: Behavior normal. Behavior is cooperative.        Thought Content: Thought content normal.        Cognition and Memory: Cognition and memory normal.        Judgment: Judgment is impulsive.     Review of Systems  Neurological:  Positive for headaches.  Psychiatric/Behavioral:  Positive for depression.   All other systems reviewed and are negative.   Blood pressure 130/87, pulse 62, temperature 99 F (37.2 C), resp. rate 19, height 4\' 11"  (1.499 m), weight 80.7 kg, last menstrual period 01/02/2023, SpO2 98%.Body mass index is 35.95 kg/m.  General Appearance: Casual  Eye Contact:  Fair  Speech:  Normal Rate  Volume:  Normal  Mood:  Depressed  Affect:  Blunt  Thought Process:   Coherent  Orientation:  Full (Time, Place, and Person)  Thought Content:  Rumination  Suicidal Thoughts:  No  Homicidal Thoughts:  No  Memory:  Immediate;   Fair Recent;   Fair Remote;   Fair  Judgement:  Fair  Insight:  Fair  Psychomotor Activity:  Normal  Concentration:  Concentration: Fair and Attention Span: Fair  Recall:  [employer] of Knowledge:  Fair  Language:  Good  Akathisia:  No  Handed:  Right  AIMS (if indicated):     Assets:  Housing Leisure Time Physical Health Resilience Social Support  ADL's:  Intact  Cognition:  WNL  Sleep:         Physical Exam: Physical Exam Vitals and nursing note reviewed.  Constitutional:      Appearance: Normal appearance.  HENT:     Head: Normocephalic.     Nose: Nose normal.  Pulmonary:     Effort: Pulmonary effort is normal.  Musculoskeletal:        General: Normal range of motion.     Cervical back: Normal range of motion.  Neurological:     General: No focal deficit present.     Mental Status: She is alert and oriented to person, place, and time.  Psychiatric:  Attention and Perception: Attention and perception normal.        Mood and Affect: Mood is depressed.        Speech: Speech normal.        Behavior: Behavior normal. Behavior is cooperative.        Thought Content: Thought content normal.        Cognition and Memory: Cognition and memory normal.        Judgment: Judgment is impulsive.    Review of Systems  Neurological:  Positive for headaches.  Psychiatric/Behavioral:  Positive for depression.   All other systems reviewed and are negative.  Blood pressure 130/87, pulse 62, temperature 99 F (37.2 C), resp. rate 19, height 4\' 11"  (1.499 m), weight 80.7 kg, last menstrual period 01/02/2023, SpO2 98%. Body mass index is 35.95 kg/m.   Treatment Plan Summary: Daily contact with patient to assess and evaluate symptoms and progress in treatment, Medication management, and Plan : Major depressive  disorder, recurrent, severe without psychosis: Zoloft 150 mg daily Lybalvi 10-10 daily A1C awaiting results, TSH WDL, and lipid panel with cholesterol 270 H and LDL 196 H; EKG completed in ED with QT interval WDL   Anxiety: Seroquel 50 mg TID per her provider at [facility] changed to 50 mg at bedtime only Gabapentin 300 mg TID Klonopin 0.5 mg BID   Insomnia Trazodone 100 mg at bedtime Remeron 45 mg at bedtime   Nightmares: Prazosin 5 mg daily  Nanine Means, NP 01/15/2023, 5:54 PM

## 2023-01-15 NOTE — Group Note (Signed)
Date:  01/15/2023 Time:  8:25 PM  Group Topic/Focus:  Dimensions of Wellness:   The focus of this group is to introduce the topic of wellness and discuss the role each dimension of wellness plays in total health.    Participation Level:  Active  Participation Quality:  Appropriate  Affect:  Appropriate  Cognitive:  Appropriate  Insight: Appropriate  Engagement in Group:  Limited  Modes of Intervention:  Activity and Discussion  Additional Comments:    Osker Mason 01/15/2023, 8:25 PM

## 2023-01-15 NOTE — Group Note (Signed)
Date:  01/15/2023 Time:  4:03 PM  Group Topic/Focus: Helps provide  a way to express emotions and experiences not easily expressed in words .It is about healing through the process of Art, including music , writing and crosswords puzzles. Healing through Art.     Participation Level:  Did Not Attend   Natalie Rose 01/15/2023, 4:03 PM

## 2023-01-15 NOTE — Group Note (Signed)
LCSW Group Therapy Note  Group Date: 01/15/2023 Start Time: 1330 End Time: 1430   Type of Therapy and Topic:  Group Therapy: Values  Participation Level:  Active   Description of Group:   This group addressed values in self, friends, family and society.  Patients went around the room and identified their core values.  Group discussion centered on how these values impact our wellbeing.  Patients reflected on how it felt to share values and how there can be similarities and differences in shared values.  Patients were encouraged to have a daily reflection of positive characteristics or circumstances.  Therapeutic Goals: Patients will verbalize five of their values  Patients will verbalize their feelings when voicing values within the different systems to include "myself, family, friends, society"  Patients will discuss the potential positive impact on their wellness/recovery of focusing on positive traits of self and others.  Summary of Patient Progress:   Patient was present in group, however, did not participate in group discussion. Patient left group early.    Therapeutic Modalities:   Cognitive Behavioral Therapy Motivational Interviewing    Claudie Fisherman 01/15/2023  3:21 PM

## 2023-01-15 NOTE — Plan of Care (Signed)
Patient stated that she had nightmares early morning otherwise she had a good day today. Patient appropriate with staff & peers. Denies SI,HI and AVH. Appetite and energy level good. ADLs maintained. Support and encouragement given.
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
## 2023-01-15 NOTE — Group Note (Signed)
Date:  01/15/2023 Time:  10:11 AM  Group Topic/Focus:  Goals Group:   The focus of this group is to help patients establish daily goals to achieve during treatment and discuss how the patient can incorporate goal setting into their daily lives to aide in recovery.    Participation Level:  Active  Participation Quality:  Appropriate  Affect:  Appropriate  Cognitive:  Appropriate  Insight: Appropriate  Engagement in Group:  Engaged  Modes of Intervention:  Discussion, Education, and Support  Additional Comments:    Wilford Corner 01/15/2023, 10:11 AM

## 2023-01-15 NOTE — Plan of Care (Signed)
Patient denies SI/HI/AVH, compliant with medication administration per MD orders.   Problem: Education: Goal: Knowledge of General Education information will improve Description: Including pain rating scale, medication(s)/side effects and non-pharmacologic comfort measures Outcome: Progressing   Problem: Health Behavior/Discharge Planning: Goal: Ability to manage health-related needs will improve Outcome: Progressing   Problem: Clinical Measurements: Goal: Ability to maintain clinical measurements within normal limits will improve Outcome: Progressing Goal: Will remain free from infection Outcome: Progressing Goal: Diagnostic test results will improve Outcome: Progressing Goal: Respiratory complications will improve Outcome: Progressing Goal: Cardiovascular complication will be avoided Outcome: Progressing   Problem: Activity: Goal: Risk for activity intolerance will decrease Outcome: Progressing   Problem: Nutrition: Goal: Adequate nutrition will be maintained Outcome: Progressing   Problem: Coping: Goal: Level of anxiety will decrease Outcome: Progressing   Problem: Elimination: Goal: Will not experience complications related to bowel motility Outcome: Progressing Goal: Will not experience complications related to urinary retention Outcome: Progressing   Problem: Pain Managment: Goal: General experience of comfort will improve Outcome: Progressing   Problem: Safety: Goal: Ability to remain free from injury will improve Outcome: Progressing   Problem: Skin Integrity: Goal: Risk for impaired skin integrity will decrease Outcome: Progressing   Problem: Education: Goal: Knowledge of [place] General Education information/materials will improve Outcome: Progressing Goal: Emotional status will improve Outcome: Progressing Goal: Mental status will improve Outcome: Progressing Goal: Verbalization of understanding the information provided will improve Outcome:  Progressing   Problem: Activity: Goal: Interest or engagement in activities will improve Outcome: Progressing Goal: Sleeping patterns will improve Outcome: Progressing   Problem: Coping: Goal: Ability to verbalize frustrations and anger appropriately will improve Outcome: Progressing Goal: Ability to demonstrate self-control will improve Outcome: Progressing   Problem: Health Behavior/Discharge Planning: Goal: Identification of resources available to assist in meeting health care needs will improve Outcome: Progressing Goal: Compliance with treatment plan for underlying cause of condition will improve Outcome: Progressing   Problem: Physical Regulation: Goal: Ability to maintain clinical measurements within normal limits will improve Outcome: Progressing   Problem: Safety: Goal: Periods of time without injury will increase Outcome: Progressing

## 2023-01-16 DIAGNOSIS — F332 Major depressive disorder, recurrent severe without psychotic features: Secondary | ICD-10-CM | POA: Diagnosis not present

## 2023-01-16 MED ORDER — QUETIAPINE FUMARATE 50 MG PO TABS: 50.0000 mg | ORAL_TABLET | Freq: Every day | ORAL | 0 refills | Status: DC

## 2023-01-16 NOTE — Plan of Care (Signed)
  Problem: Education: Goal: Knowledge of General Education information will improve Description: Including pain rating scale, medication(s)/side effects and non-pharmacologic comfort measures Outcome: Adequate for Discharge   Problem: Health Behavior/Discharge Planning: Goal: Ability to manage health-related needs will improve Outcome: Adequate for Discharge   Problem: Clinical Measurements: Goal: Ability to maintain clinical measurements within normal limits will improve Outcome: Adequate for Discharge Goal: Will remain free from infection Outcome: Adequate for Discharge Goal: Diagnostic test results will improve Outcome: Adequate for Discharge Goal: Respiratory complications will improve Outcome: Adequate for Discharge Goal: Cardiovascular complication will be avoided Outcome: Adequate for Discharge   Problem: Activity: Goal: Risk for activity intolerance will decrease Outcome: Adequate for Discharge   Problem: Nutrition: Goal: Adequate nutrition will be maintained Outcome: Adequate for Discharge   Problem: Coping: Goal: Level of anxiety will decrease Outcome: Adequate for Discharge   Problem: Elimination: Goal: Will not experience complications related to bowel motility Outcome: Adequate for Discharge Goal: Will not experience complications related to urinary retention Outcome: Adequate for Discharge   Problem: Pain Managment: Goal: General experience of comfort will improve Outcome: Adequate for Discharge   Problem: Safety: Goal: Ability to remain free from injury will improve Outcome: Adequate for Discharge   Problem: Skin Integrity: Goal: Risk for impaired skin integrity will decrease Outcome: Adequate for Discharge   Problem: Education: Goal: Knowledge of [place] General Education information/materials will improve Outcome: Adequate for Discharge Goal: Emotional status will improve Outcome: Adequate for Discharge Goal: Mental status will  improve Outcome: Adequate for Discharge Goal: Verbalization of understanding the information provided will improve Outcome: Adequate for Discharge   Problem: Activity: Goal: Interest or engagement in activities will improve Outcome: Adequate for Discharge Goal: Sleeping patterns will improve Outcome: Adequate for Discharge   Problem: Coping: Goal: Ability to verbalize frustrations and anger appropriately will improve Outcome: Adequate for Discharge Goal: Ability to demonstrate self-control will improve Outcome: Adequate for Discharge   Problem: Health Behavior/Discharge Planning: Goal: Identification of resources available to assist in meeting health care needs will improve Outcome: Adequate for Discharge Goal: Compliance with treatment plan for underlying cause of condition will improve Outcome: Adequate for Discharge   Problem: Physical Regulation: Goal: Ability to maintain clinical measurements within normal limits will improve Outcome: Adequate for Discharge   Problem: Safety: Goal: Periods of time without injury will increase Outcome: Adequate for Discharge   

## 2023-01-16 NOTE — Group Note (Signed)
Recreation Therapy Group Note   Group Topic:Goal Setting  Group Date: 01/16/2023 Start Time: 1005 End Time: 1105 Facilitators: Rosina Lowenstein, LRT, CTRS Location:  Day room  Group Description: Vision Board. Patients were given many different magazines, a glue stick, markers, and a piece of cardstock paper. LRT and pts discussed the importance of having goals in life. LRT and pts discussed the difference between short-term and long-term goals, as well as what a SMART goal is. LRT encouraged pts to create a vision board, with images they picked and then cut out with safety scissors from the magazine, for themselves, that capture their short and long-term goals. LRT encouraged pts to show and explain their vision board to the group. LRT offered to laminate vision board once dry and complete.   Goal Area(s) Addressed:  Patient will gain knowledge of short vs. long term goals.  Patient will identify goals for themselves. Patient will practice setting SMART goals. Patient will verbalize their goals to LRT and peers.   Affect/Mood: Appropriate and Flat   Participation Level: Moderate   Participation Quality: Independent   Behavior: Appropriate, Calm, and Cooperative   Speech/Thought Process: Coherent   Insight: Fair   Judgement: Good   Modes of Intervention: Art   Patient Response to Interventions:  Receptive   Education Outcome:  Acknowledges education   Clinical Observations/Individualized Feedback: Natalie Rose was active in their participation of session activities and group discussion. Pt identified "Family, well worth it, and take care of your heart" as words she cut out for her vision board. Pt left group early and did not return. Pt seemed tired. Pt interacted well with LRT and peers duration of session.   Plan: Continue to engage patient in RT group sessions 2-3x/week.   Rosina Lowenstein, LRT, CTRS 01/16/2023 11:45 AM

## 2023-01-16 NOTE — Progress Notes (Signed)
D- Patient alert and oriented. Patient presents in a pleasant mood on assessment reporting that she slept "good" last night and had no complaints to voice to this Clinical research associate. Patient endorsed slight depression and anxiety, rating them a "1/10" and "2/10". Patient stated that she has "more anxiety than anything because I had some nightmares last night". Patient denies SI, HI, AVH, and pain at this time. Patient's goal for today is "to stay mindful of my thoughts", in which she will not "dwell on negative thoughts", in order to achieve her goal.  A- Scheduled medications administered to patient, per MD orders. Support and encouragement provided. Routine safety checks conducted every 15 minutes. Patient informed to notify staff with problems or concerns.  R- No adverse drug reactions noted. Patient contracts for safety at this time. Patient compliant with medications and treatment plan. Patient receptive, calm, and cooperative. Patient interacts well with others on the unit. Patient remains safe at this time.

## 2023-01-16 NOTE — [facility] Counselor (Signed)
CSW notified pt that he has not heard back from her outpatient provider. Before CSW could give recommendation to call them she stated that she was going to call them herself. CSW shared that if he heard from them he would give her a call to update her. Pt voiced agreement with this. No other concerns expressed. Contact ended without incident.   Vilma Meckel. Algis Greenhouse, MSW, LCSW, LCAS 01/16/2023 4:01 PM

## 2023-01-16 NOTE — [facility] Counselor (Addendum)
CSW reached out to [employer] and was unable to establish contact. CSW was able to leave message with contact information for follow through.  CSW met with pt to discuss discharge plans. She shared that she plans to return home and that she has transportation to get there. Pt was updated that CSW had contacted [facility] to schedule follow up but was waiting for a call back regarding scheduling appointment. She voiced understanding. Pt has clothes to wear upon discharge. She reported that she is a cigarette smoker who is not interested in cessation services. CSW provided education around [facility] resources and she was informed that this would be added to her discharge paperwork packet. Pt endorsed use of marijuana but denied feeling she had any issues with this. CSW asked pt if she had any other concerns. She shared that she just wanted to make sure she got her Lybalvi as she had to bring her own supply as we do not have it on formulary. Pt also went on to inquire if she would be given trazodone with which to go home while also acknowledging that she knows it may not be the case as she had attempted overdose on this. CSW informed pt that he would follow up with pt's nurse regarding the Lybalvi and run the Trazodone question by the provider. She agreed. No other concerns.  CSW updated nurse about pt concerns around her home medication. Nurse informed CSW that they would be picked up from the pharmacy. CSW updated provider that pt had requested prescription for trazodone but that she also acknowledged that she attempted overdose on it previously. CSW informed that pt will not be getting this medication due to potential risk of attempted overdose.   CSW updated that she would not be getting a prescription for Trazodone. Pt voiced acceptance of this information. No other concerns expressed. Contact ended without incident.   CSW will continue to follow and is awaiting contact from [facility]  regarding follow up.   Vilma Meckel. Algis Greenhouse, MSW, LCSW, LCAS 01/16/2023 11:14 AM

## 2023-01-16 NOTE — Group Note (Signed)
Date:  01/16/2023 Time:  1:29 PM  Group Topic/Focus:  Goals Group:   The focus of this group is to help patients establish daily goals to achieve during treatment and discuss how the patient can incorporate goal setting into their daily lives to aide in recovery.  Community Group   Participation Level:  Active  Participation Quality:  Appropriate  Affect:  Appropriate  Cognitive:  Appropriate  Insight: Good  Engagement in Group:  Engaged  Modes of Intervention:  Discussion and Education  Additional Comments:    Cason Dabney A Sianne Tejada 01/16/2023, 1:29 PM

## 2023-01-16 NOTE — Progress Notes (Signed)
Patient ID: Natalie Rose, female   DOB: 1981-01-27, 42 y.o.   MRN: [id number]  Discharge Note:  Patient denies SI/HI/AVH at this time. Discharge instructions, AVS, prescriptions, and transition record gone over with patient. Patient was given a copy of her Suicide Safety Plan. Patient agrees to comply with medication management, follow-up visit, and outpatient therapy. Patient belongings, as well as home medications, were returned to patient. Patient questions and concerns addressed and answered. Patient ambulatory off unit. Patient discharged to home with a friend.

## 2023-01-16 NOTE — Discharge Summary (Signed)
Physician Discharge Summary Note  Patient:  Natalie Rose is an 42 y.o., female MRN:  [id number] DOB:  09/05/1980 Patient phone:  [phone] (home)  Patient address:   [address] [place] [address],  Total Time spent with patient: 45 minutes  Date of Admission:  01/12/2023 Date of Discharge: 01/16/2023  Reason for Admission:  overdose  Principal Problem: Severe episode of recurrent major depressive disorder, without psychotic features [facility]) Discharge Diagnoses: Active Problems:   Borderline personality disorder (HCC)   PTSD (post-traumatic stress disorder)   Intentional drug overdose (HCC)   Past Psychiatric History: depression, anxiety, PTSD  Past Medical History:  Past Medical History:  Diagnosis Date   Anxiety    Arthritis    Depression    Diabetes mellitus without complication (HCC)    Mental health disorder    Migraines     Past Surgical History:  Procedure Laterality Date   MOUTH SURGERY     Family History:  Family History  Problem Relation Age of Onset   Osteopenia Mother    Heart failure Mother    Osteoarthritis Mother    Depression Mother    Diabetes Father    Family Psychiatric  History: none Social History:  Social History   Substance and Sexual Activity  Alcohol Use Not Currently     Social History   Substance and Sexual Activity  Drug Use Yes   Frequency: 3.0 times per week   Types: Marijuana    Social History   Socioeconomic History   Marital status: Single    Spouse name: Not on file   Number of children: Not on file   Years of education: Not on file   Highest education level: Not on file  Occupational History   Not on file  Tobacco Use   Smoking status: Every Day    Current packs/day: 1.00    Average packs/day: 1 pack/day for 20.6 years (20.6 ttl pk-yrs)    Types: Cigarettes    Start date: 2004   Smokeless tobacco: Never  Vaping Use   Vaping status: Never Used  Substance and Sexual Activity    Alcohol use: Not Currently   Drug use: Yes    Frequency: 3.0 times per week    Types: Marijuana   Sexual activity: Not Currently    Birth control/protection: None  Other Topics Concern   Not on file  Social History Narrative   Not on file   Social Determinants of Health   Financial Resource Strain: Medium Risk (03/08/2020)   [facility], [facility]   Overall Financial Resource Strain (CARDIA)    Difficulty of Paying Living Expenses: Somewhat hard  Food Insecurity: No Food Insecurity (01/12/2023)   Hunger Vital Sign    Worried About Running Out of Food in the Last Year: Never true    Ran Out of Food in the Last Year: Never true  Transportation Needs: No Transportation Needs (01/12/2023)   PRAPARE - Administrator, Civil Service (Medical): No    Lack of Transportation (Non-Medical): No  Physical Activity: Insufficiently Active (05/02/2021)   [facility], [facility]   Exercise Vital Sign    Days of Exercise per Week: 4 days    Minutes of Exercise per Session: 30 min  Stress: Not on file  Social Connections: Not on file    Hospital Course:   42 yo female admitted with mood disorder with depression and suicide  attempt by overdose when got upset.  She was very tearful with poor regulation of emotions on admission.  Her medications were adjusted.  Group and individual therapy initiated along with socialization on the unit.  The client stabilized with depression symptoms resolving and minimal anxiety along with nightmares.  She met maximum capacity of hospitalization.  Discharge instructions provided with explanations along with Rx, crisis numbers, and follow up with [facility] for therapy and medication management.  Musculoskeletal: Strength & Muscle Tone: within normal limits Gait & Station: normal Patient leans: N/A   Psychiatric Specialty Exam: Physical Exam Vitals and nursing note reviewed.  Constitutional:       Appearance: Normal appearance.  HENT:     Nose: Nose normal.  Pulmonary:     Effort: Pulmonary effort is normal.  Musculoskeletal:        General: Normal range of motion.     Cervical back: Normal range of motion.  Neurological:     General: No focal deficit present.     Mental Status: She is alert and oriented to person, place, and time.  Psychiatric:        Attention and Perception: Attention and perception normal.        Mood and Affect: Mood is anxious.        Speech: Speech normal.        Behavior: Behavior normal. Behavior is cooperative.        Thought Content: Thought content normal.        Cognition and Memory: Cognition and memory normal.        Judgment: Judgment normal.       Review of Systems  Psychiatric/Behavioral:  The patient is nervous/anxious.   All other systems reviewed and are negative.    Blood pressure (!) 132/96, pulse 88, temperature 98.1 F (36.7 C), resp. rate 17, height 4\' 11"  (1.499 m), weight 80.7 kg, last menstrual period 01/02/2023, SpO2 95%.Body mass index is 35.95 kg/m.  General Appearance: Casual  Eye Contact:  Good  Speech:  Normal Rate  Volume:  Normal  Mood:  Anxious  Affect:  Congruent  Thought Process:  Coherent  Orientation:  Full (Time, Place, and Person)  Thought Content:  WDL and Logical  Suicidal Thoughts:  No  Homicidal Thoughts:  No  Memory:  Immediate;   Good Recent;   Good Remote;   Good  Judgement:  Good  Insight:  Good  Psychomotor Activity:  Normal  Concentration:  Concentration: Good and Attention Span: Good  Recall:  Good  Fund of Knowledge:  Fair  Language:  Good  Akathisia:  No  Handed:  Right  AIMS (if indicated):     Assets:  Housing Leisure Time Physical Health Resilience Social Support  ADL's:  Intact  Cognition:  WNL  Sleep:       Physical Exam: Physical Exam Vitals and nursing note reviewed.  Constitutional:      Appearance: Normal appearance.  HENT:     Head: Normocephalic.     Nose:  Nose normal.  Pulmonary:     Effort: Pulmonary effort is normal.  Musculoskeletal:        General: Normal range of motion.     Cervical back: Normal range of motion.  Neurological:     General: No focal deficit present.     Mental Status: She is alert and oriented to person, place, and time.  Psychiatric:        Attention and Perception: Attention and perception normal.  Mood and Affect: Mood is anxious.        Speech: Speech normal.        Behavior: Behavior normal. Behavior is cooperative.        Thought Content: Thought content normal.        Cognition and Memory: Cognition and memory normal.        Judgment: Judgment normal.    Review of Systems  Psychiatric/Behavioral:  The patient is nervous/anxious.   All other systems reviewed and are negative.  Blood pressure 100/83, pulse 64, temperature 98.1 F (36.7 C), resp. rate 17, height 4\' 11"  (1.499 m), weight 80.7 kg, last menstrual period 01/02/2023, SpO2 95%. Body mass index is 35.95 kg/m.   Social History   Tobacco Use  Smoking Status Every Day   Current packs/day: 1.00   Average packs/day: 1 pack/day for 20.6 years (20.6 ttl pk-yrs)   Types: Cigarettes   Start date: 2004  Smokeless Tobacco Never   Tobacco Cessation:  A prescription for an FDA-approved tobacco cessation medication was offered at discharge and the patient refused   Blood Alcohol level:  Lab Results  Component Value Date   [facility] <10 01/11/2023   ETH <10 09/14/2022    Metabolic Disorder Labs:  Lab Results  Component Value Date   HGBA1C 6.4 (H) 01/11/2023   MPG 137 01/11/2023   MPG 131.24 09/17/2022   No results found for: "PROLACTIN" Lab Results  Component Value Date   CHOL 270 (H) 01/11/2023   TRIG 140 01/11/2023   HDL 46 01/11/2023   CHOLHDL 5.9 01/11/2023   VLDL 28 01/11/2023   LDLCALC 196 (H) 01/11/2023   LDLCALC 146 (H) 09/17/2022    See Psychiatric Specialty Exam and Suicide Risk Assessment completed by Attending  Physician prior to discharge.  Discharge destination:  Home  Is patient on multiple antipsychotic therapies at discharge:  Yes,   Do you recommend tapering to monotherapy for antipsychotics?  Yes   Has Patient had three or more failed trials of antipsychotic monotherapy by history:  Yes,   Antipsychotic medications that previously failed include:   1.  Zyprexa., 2.  Kasandra Knudsen., and 3.  Risperdal.  Recommended Plan for Multiple Antipsychotic Therapies: Taper to monotherapy as described:  taper off of the Seroquel   Discharge Instructions     Diet - low sodium heart healthy   Complete by: As directed    Discharge instructions   Complete by: As directed    Follow up with [facility]   Increase activity slowly   Complete by: As directed       Allergies as of 01/16/2023       Reactions   Diazepam Hives, Anxiety, Itching, Other (See Comments), Rash   unknown unknown   Lurasidone         Medication List     STOP taking these medications    OLANZapine 10 MG tablet Commonly known as: ZYPREXA       TAKE these medications      Indication  atenolol 50 MG tablet Commonly known as: TENORMIN Take 1 tablet (50 mg total) by mouth daily.  Indication: High Blood Pressure Disorder   clonazePAM 0.5 MG tablet Commonly known as: KLONOPIN Take 1 tablet (0.5 mg total) by mouth 2 (two) times daily.  Indication: Feeling Anxious   gabapentin 300 MG capsule Commonly known as: NEURONTIN Take 1 capsule (300 mg total) by mouth 3 (three) times daily.  Indication: Social Anxiety Disorder   Lybalvi 10-10 MG Tabs  Generic drug: OLANZapine-Samidorphan Take 1 tablet by mouth daily.  Indication: mood disorder   metFORMIN 500 MG tablet Commonly known as: GLUCOPHAGE Take 1 tablet (500 mg total) by mouth 2 (two) times daily with a meal.  Indication: Type 2 Diabetes   mirtazapine 45 MG tablet Commonly known as: REMERON Take 1 tablet (45 mg total) by mouth at bedtime.  Indication:  Major Depressive Disorder, Panic Disorder   ondansetron 4 MG tablet Commonly known as: Zofran Take 1 tablet (4 mg total) by mouth daily as needed for nausea or vomiting.  Indication: Nausea and Vomiting   prazosin 2 MG capsule Commonly known as: MINIPRESS Take 2 capsules (4 mg total) by mouth at bedtime.  Indication: Frightening Dreams, Disturbed Sleep   prazosin 5 MG capsule Commonly known as: MINIPRESS Take 5 mg by mouth at bedtime.  Indication: Frightening Dreams   propranolol 10 MG tablet Commonly known as: INDERAL Take 1 tablet (10 mg total) by mouth 3 (three) times daily.  Indication: Feeling Anxious   QUEtiapine 50 MG tablet Commonly known as: SEROQUEL Take 1 tablet (50 mg total) by mouth at bedtime. What changed: when to take this  Indication: mood disorder   sertraline 50 MG tablet Commonly known as: ZOLOFT Take 3 tablets (150 mg total) by mouth daily.  Indication: Generalized Anxiety Disorder, Panic Disorder   traZODone 100 MG tablet Commonly known as: DESYREL Take 1 tablet (100 mg total) by mouth at bedtime.  Indication: Trouble Sleeping        Follow-up Information     [facility] [employer], [place].. Call.   Why: Provider has been made aware of need for appointment. When they reach back out with appointment information you will be contacted with this information. If you have not heard anything by the end of the week contact them to personally arrange appointment. Thanks! Contact information: [address] [place], [place] [address] Phone: [phone]                Follow-up recommendations:   Activity:  as tolerated  Diet:  heart healthy diet Major depressive disorder, recurrent, severe without psychosis: Zoloft 150 mg daily Lybalvi 10-10 daily A1C awaiting results, TSH WDL, and lipid panel with cholesterol 270 H and LDL 196 H; EKG completed in ED with QT interval WDL   Anxiety: Seroquel 50 mg TID per her provider at  [facility] Gabapentin 300 mg TID Klonopin 0.5 mg BID   Insomnia Trazodone 100 mg at bedtime Remeron 45 mg at bedtime   Nightmares: Prazosin 6 mg daily  Comments:  follow up with [facility]  Signed: Nanine Means, NP 01/16/2023, 3:51 PM

## 2023-01-16 NOTE — Progress Notes (Signed)
  [facility] Case Management Discharge Plan :  Will you be returning to the same living situation after discharge:  Yes,  pt plans to return home upon discharge. At discharge, do you have transportation home?: Yes,  pt support system to provide transportation at discharge. Do you have the ability to pay for your medications: Yes,  [employer].  Release of information consent forms completed and in the chart;  Patient's signature needed at discharge.  Patient to Follow up at:  Follow-up Information     [facility], [place].. Call.   Why: Provider has been made aware of need for appointment. When they reach back out with appointment information you will be contacted with this information. If you have not heard anything by the end of the week contact them to personally arrange appointment. Thanks! Contact information: [address] [place], [place] [address] Phone: [phone]                Next level of care provider has access to [facility] Link:no  Safety Planning and Suicide Prevention discussed: Yes,  SPE completed with pt.     Has patient been referred to the Quitline?: Patient refused referral for treatment  Patient has been referred for addiction treatment: Patient refused referral for treatment; referral information given to patient at discharge.  Glenis Smoker, LCSW 01/16/2023, 4:01 PM

## 2023-01-16 NOTE — [facility] Suicide Risk Assessment (Signed)
[facility] Discharge Suicide Risk Assessment   Principal Problem: Severe episode of recurrent major depressive disorder, without psychotic features [facility]) Discharge Diagnoses: Active Problems:   Borderline personality disorder (HCC)   PTSD (post-traumatic stress disorder)   Intentional drug overdose (HCC)   Total Time spent with patient: 1 hour  Musculoskeletal: Strength & Muscle Tone: within normal limits Gait & Station: normal Patient leans: N/A  Psychiatric Specialty Exam: Physical Exam Vitals and nursing note reviewed.  Constitutional:      Appearance: Normal appearance.  HENT:     Nose: Nose normal.  Pulmonary:     Effort: Pulmonary effort is normal.  Musculoskeletal:        General: Normal range of motion.     Cervical back: Normal range of motion.  Neurological:     General: No focal deficit present.     Mental Status: She is alert and oriented to person, place, and time.  Psychiatric:        Attention and Perception: Attention and perception normal.        Mood and Affect: Mood is anxious.        Speech: Speech normal.        Behavior: Behavior normal. Behavior is cooperative.        Thought Content: Thought content normal.        Cognition and Memory: Cognition and memory normal.        Judgment: Judgment normal.     Review of Systems  Psychiatric/Behavioral:  The patient is nervous/anxious.   All other systems reviewed and are negative.   Blood pressure (!) 132/96, pulse 88, temperature 98.1 F (36.7 C), resp. rate 17, height 4\' 11"  (1.499 m), weight 80.7 kg, last menstrual period 01/02/2023, SpO2 95%.Body mass index is 35.95 kg/m.  General Appearance: Casual  Eye Contact:  Good  Speech:  Normal Rate  Volume:  Normal  Mood:  Anxious  Affect:  Congruent  Thought Process:  Coherent  Orientation:  Full (Time, Place, and Person)  Thought Content:  WDL and Logical  Suicidal Thoughts:  No  Homicidal Thoughts:  No  Memory:  Immediate;   Good Recent;    Good Remote;   Good  Judgement:  Good  Insight:  Good  Psychomotor Activity:  Normal  Concentration:  Concentration: Good and Attention Span: Good  Recall:  Good  Fund of Knowledge:  Fair  Language:  Good  Akathisia:  No  Handed:  Right  AIMS (if indicated):     Assets:  Housing Leisure Time Physical Health Resilience Social Support  ADL's:  Intact  Cognition:  WNL  Sleep:        Physical Exam: Physical Exam Vitals and nursing note reviewed.  Constitutional:      Appearance: Normal appearance.  HENT:     Nose: Nose normal.  Pulmonary:     Effort: Pulmonary effort is normal.  Musculoskeletal:        General: Normal range of motion.     Cervical back: Normal range of motion.  Neurological:     General: No focal deficit present.     Mental Status: She is alert and oriented to person, place, and time.  Psychiatric:        Attention and Perception: Attention and perception normal.        Mood and Affect: Mood is anxious.        Speech: Speech normal.        Behavior: Behavior normal. Behavior is cooperative.  Thought Content: Thought content normal.        Cognition and Memory: Cognition and memory normal.        Judgment: Judgment normal.    Review of Systems  Psychiatric/Behavioral:  The patient is nervous/anxious.   All other systems reviewed and are negative.  Blood pressure (!) 132/96, pulse 88, temperature 98.1 F (36.7 C), resp. rate 17, height 4\' 11"  (1.499 m), weight 80.7 kg, last menstrual period 01/02/2023, SpO2 95%. Body mass index is 35.95 kg/m.  Mental Status Per Nursing Assessment::   On Admission:  NA  Demographic Factors:  Caucasian and Living alone  Loss Factors: NA  Historical Factors: Prior suicide attempts and Impulsivity  Risk Reduction Factors:   Sense of responsibility to family, Positive social support, and Positive therapeutic relationship  Continued Clinical Symptoms:  Anxiety, mild  Cognitive Features That  Contribute To Risk:  None    Suicide Risk:  Minimal: No identifiable suicidal ideation.  Patients presenting with no risk factors but with morbid ruminations; may be classified as minimal risk based on the severity of the depressive symptoms  Plan Of Care/Follow-up recommendations:  Activity:  as tolerated  Diet:  heart healthy diet Major depressive disorder, recurrent, severe without psychosis: Zoloft 150 mg daily Lybalvi 10-10 daily A1C awaiting results, TSH WDL, and lipid panel with cholesterol 270 H and LDL 196 H; EKG completed in ED with QT interval WDL   Anxiety: Seroquel 50 mg TID per her provider at [facility] Gabapentin 300 mg TID Klonopin 0.5 mg BID   Insomnia Trazodone 100 mg at bedtime Remeron 45 mg at bedtime   Nightmares: Prazosin 6 mg daily  Nanine Means, NP 01/16/2023, 9:22 AM

## 2023-01-18 ENCOUNTER — Ambulatory Visit: Payer: 59 | Admitting: Podiatry

## 2023-03-04 ENCOUNTER — Ambulatory Visit
Admission: RE | Admit: 2023-03-04 | Discharge: 2023-03-04 | Disposition: A | Discharge status: Home / Self Care | Payer: 59 | Source: Ambulatory Visit | Attending: Emergency Medicine | Admitting: Emergency Medicine

## 2023-03-04 VITALS — BP 95/72 | HR 76 | Temp 98.6°F | Resp 18

## 2023-03-04 DIAGNOSIS — H8309 Labyrinthitis, unspecified ear: Secondary | ICD-10-CM | POA: Diagnosis not present

## 2023-03-04 DIAGNOSIS — H6123 Impacted cerumen, bilateral: Secondary | ICD-10-CM

## 2023-03-04 DIAGNOSIS — H9203 Otalgia, bilateral: Secondary | ICD-10-CM

## 2023-03-04 DIAGNOSIS — M26623 Arthralgia of bilateral temporomandibular joint: Secondary | ICD-10-CM

## 2023-03-04 MED ORDER — NAPROXEN 500 MG PO TABS: 500.0000 mg | ORAL_TABLET | Freq: Two times a day (BID) | ORAL | 0 refills | Status: DC

## 2023-03-04 MED ORDER — FLUTICASONE PROPIONATE 50 MCG/ACT NA SUSP: 2.0000 | Freq: Every day | NASAL | 0 refills | Status: DC

## 2023-03-04 MED ORDER — PREDNISONE 10 MG (21) PO TBPK: ORAL_TABLET | ORAL | 0 refills | Status: DC

## 2023-03-04 NOTE — ED Triage Notes (Signed)
Pt presents with bilateral ear pain and ringing x 4 days. Pt states she gets dizzy when she lays down.

## 2023-03-04 NOTE — ED Provider Notes (Signed)
HPI  SUBJECTIVE:  Natalie Rose is a 42 y.o. female who presents with a sensation of both of her ears being "stopped up" accompanied with mild pain, decreased hearing, tinnitus over the past 4 days.  She reports rhinorrhea starting last night.  She reports intermittent vertigo present only with lying down and with head movement, lasting seconds.  Had 1 episode of mild nausea with the vertigo, but no vomiting, headaches.  No fevers, nasal congestion, allergy symptoms.  No recent foreign body insertion, swimming, popping/crackling when she yawns.  Noted in the past month.  No antipyretic in the past 6 hours.  She does not grind her teeth at night.  She has not tried anything for this.  No alleviating factors.  Symptoms worse with exposure to loud noises.  She has a past medical history of diabetes, depression, COPD, and is a smoker.  No history of TMJ arthralgia.  LMP: Second week of September.  Denies the possibility of being pregnant.  [facility] [place].    Past Medical History:  Diagnosis Date   Anxiety    Arthritis    Depression    Diabetes mellitus without complication (HCC)    Mental health disorder    Migraines     Past Surgical History:  Procedure Laterality Date   MOUTH SURGERY      Family History  Problem Relation Age of Onset   Osteopenia Mother    Heart failure Mother    Osteoarthritis Mother    Depression Mother    Diabetes Father     Social History   Tobacco Use   Smoking status: Every Day    Current packs/day: 1.00    Average packs/day: 1 pack/day for 20.7 years (20.7 ttl pk-yrs)    Types: Cigarettes    Start date: 2004   Smokeless tobacco: Never  Vaping Use   Vaping status: Never Used  Substance Use Topics   Alcohol use: Not Currently   Drug use: Yes    Frequency: 3.0 times per week    Types: Marijuana    No current facility-administered medications for this encounter.  Current Outpatient Medications:    fluticasone (FLONASE) 50  MCG/ACT nasal spray, Place 2 sprays into both nostrils daily., Disp: 16 g, Rfl: 0   naproxen (NAPROSYN) 500 MG tablet, Take 1 tablet (500 mg total) by mouth 2 (two) times daily., Disp: 20 tablet, Rfl: 0   predniSONE (STERAPRED UNI-PAK 21 TAB) 10 MG (21) TBPK tablet, Dispense one 6 day pack. Take as directed with food., Disp: 21 tablet, Rfl: 0   atenolol (TENORMIN) 50 MG tablet, Take 1 tablet (50 mg total) by mouth daily., Disp: 30 tablet, Rfl: 1   clonazePAM (KLONOPIN) 0.5 MG tablet, Take 1 tablet (0.5 mg total) by mouth 2 (two) times daily., Disp: 60 tablet, Rfl: 1   gabapentin (NEURONTIN) 300 MG capsule, Take 1 capsule (300 mg total) by mouth 3 (three) times daily., Disp: 90 capsule, Rfl: 1   LYBALVI 10-10 MG TABS, Take 1 tablet by mouth daily., Disp: , Rfl:    metFORMIN (GLUCOPHAGE) 500 MG tablet, Take 1 tablet (500 mg total) by mouth 2 (two) times daily with a meal., Disp: 60 tablet, Rfl: 1   mirtazapine (REMERON) 45 MG tablet, Take 1 tablet (45 mg total) by mouth at bedtime., Disp: 30 tablet, Rfl: 1   ondansetron (ZOFRAN) 4 MG tablet, Take 1 tablet (4 mg total) by mouth daily as needed for nausea or vomiting., Disp: 30 tablet, Rfl:  1   prazosin (MINIPRESS) 2 MG capsule, Take 2 capsules (4 mg total) by mouth at bedtime., Disp: 60 capsule, Rfl: 1   prazosin (MINIPRESS) 5 MG capsule, Take 5 mg by mouth at bedtime., Disp: , Rfl:    propranolol (INDERAL) 10 MG tablet, Take 1 tablet (10 mg total) by mouth 3 (three) times daily., Disp: 90 tablet, Rfl: 1   QUEtiapine (SEROQUEL) 50 MG tablet, Take 1 tablet (50 mg total) by mouth at bedtime., Disp: 30 tablet, Rfl: 0   sertraline (ZOLOFT) 50 MG tablet, Take 3 tablets (150 mg total) by mouth daily., Disp: 90 tablet, Rfl: 1   traZODone (DESYREL) 100 MG tablet, Take 1 tablet (100 mg total) by mouth at bedtime., Disp: 30 tablet, Rfl: 1  Allergies  Allergen Reactions   Diazepam Hives, Anxiety, Itching, Other (See Comments) and Rash    unknown unknown     Lurasidone      ROS  As noted in HPI.   Physical Exam  BP 95/72 (BP Location: Right Arm)   Pulse 76   Temp 98.6 F (37 C) (Oral)   Resp 18   SpO2 97%   Constitutional: Well developed, well nourished, no acute distress Eyes:  EOMI, conjunctiva normal bilaterally.  No nystagmus.  Negative head impulse test. HENT: Normocephalic, atraumatic,mucus membranes moist.  Bilateral external ears normal.  Bilateral EACs normal.  Bilateral TMs mostly obscured with cerumen.  Mild pain with traction on pinna, palpation of TMJ bilaterally.  No tenderness with palpation of tragus or mastoid bilaterally.  No TMJ crepitus. Respiratory: Normal inspiratory effort Cardiovascular: Normal rate, regular rhythm, no murmurs rubs or gallops.  No carotid bruit GI: nondistended skin: No rash, skin intact Musculoskeletal: no deformities Neurologic: Alert & oriented x 3, no focal neuro deficits.  Tandem gait steady.  Coordination normal. Psychiatric: Speech and behavior appropriate   ED Course   Medications - No data to display  Orders Placed This Encounter  Procedures   Ear wax removal    Bilateral please    Standing Status:   Standing    Number of Occurrences:   1    No results found for this or any previous visit (from the past 24 hour(s)). No results found.  ED Clinical Impression  1. Otalgia of both ears   2. Bilateral impacted cerumen   3. Labyrinthitis, unspecified laterality   4. Bilateral temporomandibular joint pain      ED Assessment/Plan     On reevaluation post lavage, patient states that she feels better.  Her right EAC, TM is intact and normal.  There is some skin partially obscuring the left TM, but from what I can see, the TM appears intact.  I suspect that she has a labyrinthitis.  I do not appreciate any evidence of otitis media.  Doubt central cause of her vertigo as the vertigo it is very positional with her head.  Will send home with Flonase, 6-day prednisone.   She  also has tenderness over the TMJ bilaterally.  She can try soft diet, Naprosyn and Tylenol with this.  She is to follow-up with her PCP and ENT in a week if not getting any better, strict ER return precautions if she gets worse.  Discussed MDM, treatment plan, and plan for follow-up with patient. Discussed sn/sx that should prompt return to the ED. patient agrees with plan.   Meds ordered this encounter  Medications   naproxen (NAPROSYN) 500 MG tablet    Sig: Take 1 tablet (  500 mg total) by mouth 2 (two) times daily.    Dispense:  20 tablet    Refill:  0   predniSONE (STERAPRED UNI-PAK 21 TAB) 10 MG (21) TBPK tablet    Sig: Dispense one 6 day pack. Take as directed with food.    Dispense:  21 tablet    Refill:  0   fluticasone (FLONASE) 50 MCG/ACT nasal spray    Sig: Place 2 sprays into both nostrils daily.    Dispense:  16 g    Refill:  0      *This clinic note was created using Scientist, clinical (histocompatibility and immunogenetics). Therefore, there may be occasional mistakes despite careful proofreading.  ?    Domenick Gong, MD 03/05/23 [phone]

## 2023-03-04 NOTE — Discharge Instructions (Signed)
I suspect you have inflammation in your inner ear causing your symptoms.  I think also the earwax in your ears was contributing to it.  I am sending you home with Flonase and a prednisone taper.  Take the Naprosyn with 1000 mg of Tylenol twice a day.  This will help with your jaw soreness.  Soft foods for the next few days.

## 2023-05-08 ENCOUNTER — Ambulatory Visit
Admission: EM | Admit: 2023-05-08 | Discharge: 2023-05-08 | Disposition: A | Discharge status: Home / Self Care | Payer: 59

## 2023-05-08 DIAGNOSIS — M62838 Other muscle spasm: Secondary | ICD-10-CM | POA: Diagnosis not present

## 2023-05-08 MED ORDER — IBUPROFEN 600 MG PO TABS: 600.0000 mg | ORAL_TABLET | Freq: Four times a day (QID) | ORAL | 0 refills | Status: DC | PRN

## 2023-05-08 MED ORDER — BACLOFEN 10 MG PO TABS: 10.0000 mg | ORAL_TABLET | Freq: Three times a day (TID) | ORAL | 0 refills | Status: DC

## 2023-05-08 NOTE — Discharge Instructions (Addendum)
Take the ibuprofen, 600 mg every 6 hours with food, on a schedule for the next 48 hours and then as needed.  Take the baclofen, 10 mg every 8 hours, on a schedule for the next 48 hours and then as needed.  Apply moist heat to your neck for 30 minutes at a time 2-3 times a day to improve blood flow to the area and help remove the lactic acid causing the spasm.  Follow the neck exercises given at discharge.  Return for reevaluation for any new or worsening symptoms.  

## 2023-05-08 NOTE — ED Triage Notes (Signed)
Pt c/o neck stiffness & pain onset last night. Pt denies any trauma, no falls.

## 2023-05-08 NOTE — ED Provider Notes (Signed)
[facility]    CSN: [id number] Arrival date & time: 05/08/23  1747      History   Chief Complaint Chief Complaint  Patient presents with   Neck Pain    HPI Natalie Rose is a 42 y.o. female.   HPI  42 year old female with a past medical history significant for diabetes, migraines, depression, and arthritis presents for evaluation of neck stiffness that started last night.  She states that she thinks she slept on her neck wrong.  She has taken Tylenol and used a heating pad with mild improvement of symptoms.  She denies any falls or injuries.  No numbness, tingling, or weakness in her hands or arms.  Past Medical History:  Diagnosis Date   Anxiety    Arthritis    Depression    Diabetes mellitus without complication (HCC)    Mental health disorder    Migraines     Patient Active Problem List   Diagnosis Date Noted   Mood disorder (HCC) 01/15/2023   Intentional drug overdose (HCC) 01/11/2023   OCD (obsessive compulsive disorder) 09/16/2022   Panic attack 06/25/2022   Self-inflicted laceration of wrist (HCC) 06/17/2018   Cannabis abuse 06/17/2018   Borderline personality disorder (HCC) 09/20/2012   PTSD (post-traumatic stress disorder) 09/20/2012    Past Surgical History:  Procedure Laterality Date   MOUTH SURGERY      OB History   No obstetric history on file.      Home Medications    Prior to Admission medications   Medication Sig Start Date End Date Taking? Authorizing Provider  baclofen (LIORESAL) 10 MG tablet Take 1 tablet (10 mg total) by mouth 3 (three) times daily. 05/08/23  Yes Becky Augusta, NP  hydrOXYzine (ATARAX) 50 MG tablet Take by mouth. 02/20/23  Yes [provider]  ibuprofen (ADVIL) 600 MG tablet Take 1 tablet (600 mg total) by mouth every 6 (six) hours as needed. 05/08/23  Yes Becky Augusta, NP  ferrous sulfate (FEROSUL) 325 (65 FE) MG tablet Take 325 mg by mouth daily with breakfast.  04/29/20  [provider]  atenolol (TENORMIN) 50 MG tablet Take 1 tablet (50 mg total) by mouth daily. 09/21/22   Clapacs, [name], MD  clonazePAM (KLONOPIN) 0.5 MG tablet Take 1 tablet (0.5 mg total) by mouth 2 (two) times daily. 09/21/22   Clapacs, [name], MD  fluticasone (FLONASE) 50 MCG/ACT nasal spray Place 2 sprays into both nostrils daily. 03/04/23   Domenick Gong, MD  gabapentin (NEURONTIN) 300 MG capsule Take 1 capsule (300 mg total) by mouth 3 (three) times daily. 09/21/22   Clapacs, [name], MD  LYBALVI 10-10 MG TABS Take 1 tablet by mouth daily. 12/20/22   [provider]  metFORMIN (GLUCOPHAGE) 500 MG tablet Take 1 tablet (500 mg total) by mouth 2 (two) times daily with a meal. 09/21/22   Clapacs, [name], MD  mirtazapine (REMERON) 45 MG tablet Take 1 tablet (45 mg total) by mouth at bedtime. 09/21/22   Clapacs, [name], MD  naproxen (NAPROSYN) 500 MG tablet Take 1 tablet (500 mg total) by mouth 2 (two) times daily. 03/04/23   Domenick Gong, MD  ondansetron (ZOFRAN) 4 MG tablet Take 1 tablet (4 mg total) by mouth daily as needed for nausea or vomiting. 01/11/23 01/11/24  Pilar Jarvis, MD  prazosin (MINIPRESS) 2 MG capsule Take 2 capsules (4 mg total) by mouth at bedtime. 09/21/22   Clapacs, [name], MD  prazosin (MINIPRESS)  5 MG capsule Take 5 mg by mouth at bedtime. 12/26/22   [provider]  propranolol (INDERAL) 10 MG tablet Take 1 tablet (10 mg total) by mouth 3 (three) times daily. 09/21/22   Clapacs, [name], MD  QUEtiapine (SEROQUEL) 50 MG tablet Take 1 tablet (50 mg total) by mouth at bedtime. 01/16/23 02/15/23  Charm Rings, NP  sertraline (ZOLOFT) 50 MG tablet Take 3 tablets (150 mg total) by mouth daily. 09/21/22   Clapacs, [name], MD  traZODone (DESYREL) 100 MG tablet Take 1 tablet (100 mg total) by mouth at bedtime. 09/21/22   Clapacs, [name], MD  eszopiclone (LUNESTA) 2 MG TABS tablet Take 2 mg by mouth at bedtime as needed for sleep. Take immediately before bedtime  04/29/20   [provider]  FLUoxetine (PROZAC) 40 MG capsule Take 40 mg by mouth daily.  10/08/19  [provider]  lurasidone (LATUDA) 40 MG TABS tablet Take 40 mg by mouth daily with breakfast.  10/08/19  [provider]    Family History Family History  Problem Relation Age of Onset   Osteopenia Mother    Heart failure Mother    Osteoarthritis Mother    Depression Mother    Diabetes Father     Social History Social History   Tobacco Use   Smoking status: Every Day    Current packs/day: 1.00    Average packs/day: 1 pack/day for 20.9 years (20.9 ttl pk-yrs)    Types: Cigarettes    Start date: 2004   Smokeless tobacco: Never  Vaping Use   Vaping status: Never Used  Substance Use Topics   Alcohol use: Not Currently   Drug use: Yes    Frequency: 3.0 times per week    Types: Marijuana     Allergies   Diazepam and Lurasidone   Review of Systems Review of Systems  Musculoskeletal:  Positive for neck pain and neck stiffness.  Neurological:  Negative for weakness and numbness.     Physical Exam Triage Vital Signs ED Triage Vitals  Encounter Vitals Group     BP      Systolic BP Percentile      Diastolic BP Percentile      Pulse      Resp      Temp      Temp src      SpO2      Weight      Height      Head Circumference      Peak Flow      Pain Score      Pain Loc      Pain Education      Exclude from Growth Chart    No data found.  Updated Vital Signs BP 101/70 (BP Location: Left Arm)   Pulse 79   Temp 98 F (36.7 C) (Oral)   LMP 05/06/2023   SpO2 94%   Visual Acuity Right Eye Distance:   Left Eye Distance:   Bilateral Distance:    Right Eye Near:   Left Eye Near:    Bilateral Near:     Physical Exam Vitals and nursing note reviewed.  Constitutional:      Appearance: Normal appearance. She is not ill-appearing.  HENT:     Head: Normocephalic and atraumatic.  Musculoskeletal:        General: Tenderness present. No signs  of injury.  Skin:    General: Skin is warm and dry.  Capillary Refill: Capillary refill takes less than 2 seconds.     Findings: No bruising or erythema.  Neurological:     General: No focal deficit present.     Mental Status: She is alert and oriented to person, place, and time.      UC Treatments / Results  Labs (all labs ordered are listed, but only abnormal results are displayed) Labs Reviewed - No data to display  EKG   Radiology No results found.  Procedures Procedures (including critical care time)  Medications Ordered in UC Medications - No data to display  Initial Impression / Assessment and Plan / UC Course  I have reviewed the triage vital signs and the nursing notes.  Pertinent labs & imaging results that were available during my care of the patient were reviewed by me and considered in my medical decision making (see chart for details).   Patient is a nontoxic-appearing 42 year old female presenting for evaluation of 1 day worth of neck pain and stiffness.  On exam she has no midline spinous process tenderness or step-off in her cervical upper thoracic spine.  There is tension and some mild spasm in the bilateral cervical paraspinous region as well as tension in the upper trapezius muscles bilaterally.  She has full range of motion though it does cause her discomfort.  Bilateral grips and upper extremity strength are 5/5.  Patient exam is consistent with musculoskeletal neck pain and I will discharge her home on ibuprofen 600 mg every 6 hours with food along with baclofen 10 mg every 8 hours, home physical therapy exercises, and moist heat application.  If her symptoms do not improve, or they worsen, she can return for reevaluation or follow-up with her primary care provider.   Final Clinical Impressions(s) / UC Diagnoses   Final diagnoses:  Muscle spasms of neck     Discharge Instructions      Take the ibuprofen, 600 mg every 6 hours with food, on a  schedule for the next 48 hours and then as needed.  Take the baclofen, 10 mg every 8 hours, on a schedule for the next 48 hours and then as needed.  Apply moist heat to your neck for 30 minutes at a time 2-3 times a day to improve blood flow to the area and help remove the lactic acid causing the spasm.  Follow the neck exercises given at discharge.  Return for reevaluation for any new or worsening symptoms.      ED Prescriptions     Medication Sig Dispense Auth. Provider   ibuprofen (ADVIL) 600 MG tablet Take 1 tablet (600 mg total) by mouth every 6 (six) hours as needed. 30 tablet Becky Augusta, NP   baclofen (LIORESAL) 10 MG tablet Take 1 tablet (10 mg total) by mouth 3 (three) times daily. 30 each Becky Augusta, NP      PDMP not reviewed this encounter.   Becky Augusta, NP 05/08/23 1935

## 2023-05-25 ENCOUNTER — Ambulatory Visit
Admission: EM | Admit: 2023-05-25 | Discharge: 2023-05-25 | Disposition: A | Discharge status: Home / Self Care | Payer: 59 | Attending: Family Medicine | Admitting: Family Medicine

## 2023-05-25 DIAGNOSIS — M25561 Pain in right knee: Secondary | ICD-10-CM

## 2023-05-25 DIAGNOSIS — M25562 Pain in left knee: Secondary | ICD-10-CM | POA: Diagnosis not present

## 2023-05-25 MED ORDER — MELOXICAM 15 MG PO TABS: 15.0000 mg | ORAL_TABLET | Freq: Every day | ORAL | 0 refills | Status: DC

## 2023-05-25 NOTE — ED Triage Notes (Signed)
Patient presents with bilateral knee pain x 1.5 weeks, left is worst. Treated with Ibuprofen and heating pad.

## 2023-05-25 NOTE — Discharge Instructions (Signed)
Please call [facility] clinic Orthopedics [phone]) OR [facility] [phone]) for an appt.

## 2023-05-25 NOTE — ED Provider Notes (Signed)
[facility]    CSN: [id number] Arrival date & time: 05/25/23  1451      History   Chief Complaint Knee pain  HPI 42 year old female presents with knee pain.  Bilateral knee pain over the past 1.5 weeks.  Worse with activity particular going up and down stairs.  She has been using ibuprofen and heating pad without resolution.  No recent fall, trauma, injury.  Left knee pain is greater than right.  Patient reports a remote history of corticosteroid injections in the knees.  Prior imaging in 2018 revealed normal x-rays of the knees.  Past Medical History:  Diagnosis Date   Anxiety    Arthritis    Depression    Diabetes mellitus without complication (HCC)    Mental health disorder    Migraines     Patient Active Problem List   Diagnosis Date Noted   Mood disorder (HCC) 01/15/2023   Intentional drug overdose (HCC) 01/11/2023   OCD (obsessive compulsive disorder) 09/16/2022   Panic attack 06/25/2022   Self-inflicted laceration of wrist (HCC) 06/17/2018   Cannabis abuse 06/17/2018   Borderline personality disorder (HCC) 09/20/2012   PTSD (post-traumatic stress disorder) 09/20/2012    Past Surgical History:  Procedure Laterality Date   MOUTH SURGERY      OB History   No obstetric history on file.      Home Medications    Prior to Admission medications   Medication Sig Start Date End Date Taking? Authorizing Provider  meloxicam (MOBIC) 15 MG tablet Take 1 tablet (15 mg total) by mouth daily. Knee pain. 05/25/23  Yes Ericia Moxley G, DO  ferrous sulfate (FEROSUL) 325 (65 FE) MG tablet Take 325 mg by mouth daily with breakfast.  04/29/20  [provider]  atenolol (TENORMIN) 50 MG tablet Take 1 tablet (50 mg total) by mouth daily. 09/21/22   Clapacs, [name], MD  baclofen (LIORESAL) 10 MG tablet Take 1 tablet (10 mg total) by mouth 3 (three) times daily. 05/08/23   Becky Augusta, NP  clonazePAM (KLONOPIN) 0.5 MG tablet Take 1 tablet (0.5 mg total) by  mouth 2 (two) times daily. 09/21/22   Clapacs, [name], MD  fluticasone (FLONASE) 50 MCG/ACT nasal spray Place 2 sprays into both nostrils daily. 03/04/23   Domenick Gong, MD  gabapentin (NEURONTIN) 300 MG capsule Take 1 capsule (300 mg total) by mouth 3 (three) times daily. 09/21/22   Clapacs, [name], MD  hydrOXYzine (ATARAX) 50 MG tablet Take by mouth. 02/20/23   [provider]  ibuprofen (ADVIL) 600 MG tablet Take 1 tablet (600 mg total) by mouth every 6 (six) hours as needed. 05/08/23   Becky Augusta, NP  LYBALVI 10-10 MG TABS Take 1 tablet by mouth daily. 12/20/22   [provider]  metFORMIN (GLUCOPHAGE) 500 MG tablet Take 1 tablet (500 mg total) by mouth 2 (two) times daily with a meal. 09/21/22   Clapacs, [name], MD  mirtazapine (REMERON) 45 MG tablet Take 1 tablet (45 mg total) by mouth at bedtime. 09/21/22   Clapacs, [name], MD  ondansetron (ZOFRAN) 4 MG tablet Take 1 tablet (4 mg total) by mouth daily as needed for nausea or vomiting. 01/11/23 01/11/24  Pilar Jarvis, MD  prazosin (MINIPRESS) 2 MG capsule Take 2 capsules (4 mg total) by mouth at bedtime. 09/21/22   Clapacs, [name], MD  prazosin (MINIPRESS) 5 MG capsule Take 5 mg by mouth at bedtime. 12/26/22   [provider]  propranolol (  INDERAL) 10 MG tablet Take 1 tablet (10 mg total) by mouth 3 (three) times daily. 09/21/22   Clapacs, [name], MD  QUEtiapine (SEROQUEL) 50 MG tablet Take 1 tablet (50 mg total) by mouth at bedtime. 01/16/23 02/15/23  Charm Rings, NP  sertraline (ZOLOFT) 50 MG tablet Take 3 tablets (150 mg total) by mouth daily. 09/21/22   Clapacs, [name], MD  traZODone (DESYREL) 100 MG tablet Take 1 tablet (100 mg total) by mouth at bedtime. 09/21/22   Clapacs, [name], MD  eszopiclone (LUNESTA) 2 MG TABS tablet Take 2 mg by mouth at bedtime as needed for sleep. Take immediately before bedtime  04/29/20  [provider]  FLUoxetine (PROZAC) 40 MG capsule Take 40 mg by mouth daily.  10/08/19  [provider]  lurasidone (LATUDA) 40 MG TABS tablet Take 40 mg by mouth daily with breakfast.  10/08/19  [provider]    Family History Family History  Problem Relation Age of Onset   Osteopenia Mother    Heart failure Mother    Osteoarthritis Mother    Depression Mother    Diabetes Father     Social History Social History   Tobacco Use   Smoking status: Every Day    Current packs/day: 1.00    Average packs/day: 1 pack/day for 21.0 years (21.0 ttl pk-yrs)    Types: Cigarettes    Start date: 2004   Smokeless tobacco: Never  Vaping Use   Vaping status: Never Used  Substance Use Topics   Alcohol use: Not Currently   Drug use: Yes    Frequency: 3.0 times per week    Types: Marijuana     Allergies   Diazepam and Lurasidone   Review of Systems Review of Systems Per HPI  Physical Exam Triage Vital Signs ED Triage Vitals  Encounter Vitals Group     BP 05/25/23 1529 (!) 85/66     Systolic BP Percentile --      Diastolic BP Percentile --      Pulse Rate 05/25/23 1529 78     Resp 05/25/23 1529 15     Temp 05/25/23 1529 97.6 F (36.4 C)     Temp Source 05/25/23 1529 Oral     SpO2 05/25/23 1529 97 %     Weight 05/25/23 1528 180 lb (81.6 kg)     Height 05/25/23 1528 4\' 11"  (1.499 m)     Head Circumference --      Peak Flow --      Pain Score 05/25/23 1528 6     Pain Loc --      Pain Education --      Exclude from Growth Chart --    No data found.  Updated Vital Signs BP (!) 85/66 (BP Location: Left Arm)   Pulse 78   Temp 97.6 F (36.4 C) (Oral)   Resp 15   Ht 4\' 11"  (1.499 m)   Wt 81.6 kg   LMP 05/06/2023 (Exact Date)   SpO2 97%   BMI 36.36 kg/m   Visual Acuity Right Eye Distance:   Left Eye Distance:   Bilateral Distance:    Right Eye Near:   Left Eye Near:    Bilateral Near:     Physical Exam Constitutional:      General: She is not in acute distress. HENT:     Head: Normocephalic and atraumatic.  Pulmonary:     Effort:  Pulmonary effort is normal. No respiratory distress.  Musculoskeletal:     Comments: Right and left knees with no evidence of swelling or effusion.  No joint line tenderness.  Ligaments intact.  Neurological:     Mental Status: She is alert.      UC Treatments / Results  Labs (all labs ordered are listed, but only abnormal results are displayed) Labs Reviewed - No data to display  EKG   Radiology No results found.  Procedures Procedures (including critical care time)  Medications Ordered in UC Medications - No data to display  Initial Impression / Assessment and Plan / UC Course  I have reviewed the triage vital signs and the nursing notes.  Pertinent labs & imaging results that were available during my care of the patient were reviewed by me and considered in my medical decision making (see chart for details).    43 year old female presents with bilateral knee pain.  Suspected patellofemoral syndrome.  Meloxicam as directed.  Advised to see orthopedics.  Final Clinical Impressions(s) / UC Diagnoses   Final diagnoses:  Acute pain of both knees     Discharge Instructions      Please call [facility] clinic Orthopedics [phone]) OR [facility] [phone]) for an appt.    ED Prescriptions     Medication Sig Dispense Auth. Provider   meloxicam (MOBIC) 15 MG tablet Take 1 tablet (15 mg total) by mouth daily. Knee pain. 30 tablet Tommie Sams, DO      PDMP not reviewed this encounter.   Tommie Sams, [place] 05/25/23 [phone]

## 2023-06-25 ENCOUNTER — Ambulatory Visit
Admission: EM | Admit: 2023-06-25 | Discharge: 2023-06-25 | Disposition: A | Discharge status: Home / Self Care | Payer: 59 | Attending: Emergency Medicine | Admitting: Emergency Medicine

## 2023-06-25 ENCOUNTER — Encounter: Payer: Self-pay | Admitting: Emergency Medicine

## 2023-06-25 DIAGNOSIS — M26623 Arthralgia of bilateral temporomandibular joint: Secondary | ICD-10-CM | POA: Diagnosis not present

## 2023-06-25 DIAGNOSIS — H6501 Acute serous otitis media, right ear: Secondary | ICD-10-CM | POA: Diagnosis not present

## 2023-06-25 MED ORDER — IBUPROFEN 600 MG PO TABS: 600.0000 mg | ORAL_TABLET | Freq: Four times a day (QID) | ORAL | 0 refills | Status: DC | PRN

## 2023-06-25 MED ORDER — PREDNISONE 20 MG PO TABS: 40.0000 mg | ORAL_TABLET | Freq: Every day | ORAL | 0 refills | Status: AC

## 2023-06-25 MED ORDER — CYCLOBENZAPRINE HCL 10 MG PO TABS: 10.0000 mg | ORAL_TABLET | Freq: Every day | ORAL | 0 refills | Status: DC

## 2023-06-25 NOTE — ED Provider Notes (Signed)
HPI  SUBJECTIVE:  Natalie Rose is a 43 y.o. female who presents with bilateral ear pain and tinnitus for the past few days, dizziness described as vertigo lasting minutes that is worse with lying down.  No change in her hearing.  No otorrhea, fevers, nasal congestion, rhinorrhea, allergy symptoms.  She denies foreign body insertion, recent trauma to the ear, ear popping.  She does not grind her teeth at night.  She has tried ibuprofen 600 mg with improvement in her symptoms.  No aggravating factors.  Pain is not aggravated with chewing or yawning. Patient has a past medical history of diabetes, migraines and depression, no history of TMJ arthralgia.  LMP: Beginning of January.  Denies the possibility of being pregnant.  PCP: [facility].  Past Medical History:  Diagnosis Date   Anxiety    Arthritis    Depression    Diabetes mellitus without complication (HCC)    Mental health disorder    Migraines     Past Surgical History:  Procedure Laterality Date   MOUTH SURGERY      Family History  Problem Relation Age of Onset   Osteopenia Mother    Heart failure Mother    Osteoarthritis Mother    Depression Mother    Diabetes Father     Social History   Tobacco Use   Smoking status: Every Day    Current packs/day: 1.00    Average packs/day: 1 pack/day for 21.1 years (21.1 ttl pk-yrs)    Types: Cigarettes    Start date: 2004   Smokeless tobacco: Never  Vaping Use   Vaping status: Never Used  Substance Use Topics   Alcohol use: Not Currently   Drug use: Yes    Frequency: 3.0 times per week    Types: Marijuana    No current facility-administered medications for this encounter.  Current Outpatient Medications:    cyclobenzaprine (FLEXERIL) 10 MG tablet, Take 1 tablet (10 mg total) by mouth at bedtime., Disp: 20 tablet, Rfl: 0   predniSONE (DELTASONE) 20 MG tablet, Take 2 tablets (40 mg total) by mouth daily with breakfast for 5 days., Disp: 10 tablet, Rfl: 0    atenolol (TENORMIN) 50 MG tablet, Take 1 tablet (50 mg total) by mouth daily., Disp: 30 tablet, Rfl: 1   baclofen (LIORESAL) 10 MG tablet, Take 1 tablet (10 mg total) by mouth 3 (three) times daily., Disp: 30 each, Rfl: 0   clonazePAM (KLONOPIN) 0.5 MG tablet, Take 1 tablet (0.5 mg total) by mouth 2 (two) times daily., Disp: 60 tablet, Rfl: 1   fluticasone (FLONASE) 50 MCG/ACT nasal spray, Place 2 sprays into both nostrils daily., Disp: 16 g, Rfl: 0   gabapentin (NEURONTIN) 300 MG capsule, Take 1 capsule (300 mg total) by mouth 3 (three) times daily., Disp: 90 capsule, Rfl: 1   hydrOXYzine (ATARAX) 50 MG tablet, Take by mouth., Disp: , Rfl:    ibuprofen (ADVIL) 600 MG tablet, Take 1 tablet (600 mg total) by mouth every 6 (six) hours as needed., Disp: 30 tablet, Rfl: 0   LYBALVI 10-10 MG TABS, Take 1 tablet by mouth daily., Disp: , Rfl:    metFORMIN (GLUCOPHAGE) 500 MG tablet, Take 1 tablet (500 mg total) by mouth 2 (two) times daily with a meal., Disp: 60 tablet, Rfl: 1   mirtazapine (REMERON) 45 MG tablet, Take 1 tablet (45 mg total) by mouth at bedtime., Disp: 30 tablet, Rfl: 1   ondansetron (ZOFRAN) 4 MG tablet, Take 1  tablet (4 mg total) by mouth daily as needed for nausea or vomiting., Disp: 30 tablet, Rfl: 1   prazosin (MINIPRESS) 2 MG capsule, Take 2 capsules (4 mg total) by mouth at bedtime., Disp: 60 capsule, Rfl: 1   prazosin (MINIPRESS) 5 MG capsule, Take 5 mg by mouth at bedtime., Disp: , Rfl:    propranolol (INDERAL) 10 MG tablet, Take 1 tablet (10 mg total) by mouth 3 (three) times daily., Disp: 90 tablet, Rfl: 1   QUEtiapine (SEROQUEL) 50 MG tablet, Take 1 tablet (50 mg total) by mouth at bedtime., Disp: 30 tablet, Rfl: 0   sertraline (ZOLOFT) 50 MG tablet, Take 3 tablets (150 mg total) by mouth daily., Disp: 90 tablet, Rfl: 1   traZODone (DESYREL) 100 MG tablet, Take 1 tablet (100 mg total) by mouth at bedtime., Disp: 30 tablet, Rfl: 1  Allergies  Allergen Reactions   Diazepam  Hives, Anxiety, Itching, Other (See Comments) and Rash    unknown unknown    Lurasidone      ROS  As noted in HPI.   Physical Exam  BP 109/84 (BP Location: Left Arm)   Pulse 77   Temp 98.1 F (36.7 C) (Oral)   Resp 18   LMP 06/05/2023 (Approximate)   SpO2 98%   Constitutional: Well developed, well nourished, no acute distress Eyes:  EOMI, conjunctiva normal bilaterally HENT: Normocephalic, atraumatic,mucus membranes moist.  Bilateral hearing intact and equal bilaterally. Bilateral ears:  external ear, EAC normal.  Pain with traction on pinna, palpation of tragus or mastoid.  Tenderness at the TMJ bilaterally.  No jaw crepitus. Serous fluid behind the right TM.  TM normal color, not dull or bulging.  Left TM normal. Positive mild nasal congestion. Neck: No cervical lymphadenopathy Respiratory: Normal inspiratory effort Cardiovascular: Normal rate GI: nondistended skin: No rash, skin intact Musculoskeletal: no deformities Neurologic: Alert & oriented x 3, no focal neuro deficits Psychiatric: Speech and behavior appropriate   ED Course   Medications - No data to display  No orders of the defined types were placed in this encounter.   No results found for this or any previous visit (from the past 24 hours). No results found.  ED Clinical Impression  1. Non-recurrent acute serous otitis media of right ear   2. Bilateral temporomandibular joint pain      ED Assessment/Plan     Patient presents with bilateral TMJ arthralgia and a serous otitis media on the right side.  Will have her start Flonase, Sudafed.  I suspect that the serous otitis is what is causing her vertigo.  We discussed doing a wait-and-see prescription of antibiotics, but given the literature does not support the routine use of antibiotics in this case, we have decided to wait.  For the TMJ: Ibuprofen/Tylenol, Flexeril to take at night, soft diet, prednisone 40 mg for 5 days.  Advised her that this  will elevate her sugars.  She has tolerated this before without any issues.  Follow-up with a dentist of choice for a bite guard.  Discussed MDM, treatment plan, and plan for follow-up with patient. patient agrees with plan.   Meds ordered this encounter  Medications   ibuprofen (ADVIL) 600 MG tablet    Sig: Take 1 tablet (600 mg total) by mouth every 6 (six) hours as needed.    Dispense:  30 tablet    Refill:  0   cyclobenzaprine (FLEXERIL) 10 MG tablet    Sig: Take 1 tablet (10 mg total) by  mouth at bedtime.    Dispense:  20 tablet    Refill:  0   predniSONE (DELTASONE) 20 MG tablet    Sig: Take 2 tablets (40 mg total) by mouth daily with breakfast for 5 days.    Dispense:  10 tablet    Refill:  0      *This clinic note was created using Scientist, clinical (histocompatibility and immunogenetics). Therefore, there may be occasional mistakes despite careful proofreading.  ?    Domenick Gong, MD 06/27/23 1304

## 2023-06-25 NOTE — Discharge Instructions (Signed)
Do not take the lithium if you are taking the Flexeril.  Take 16 mg of ibuprofen with 1000 mg of Tylenol 3 -4 times per day.  Soft diet for the next 4 to 5 days.  Prednisone will help with inflammation.  This will elevate your sugars.  Start Flonase.  Sudafed may help with the fluid in your ear.  Follow-up with a dentist of your choice for a bite guard.  OPTIONS FOR DENTAL FOLLOW UP CARE  [place] [facility] - Local Safety Net Dental Clinics [email or web address]   [facility] [phone])  [facility] [phone])  [place] [phone] ext [phone])  [facility] [phone])  [facility] [phone]) This clinic caters to the indigent population and is on a lottery system. Location: [employer] [facility], [employer], [address], [place] Clinic Hours: Wednesdays from 6pm - 9pm, patients seen by a lottery system. For dates, call or go to [email or web address] Services: Cleanings, fillings and simple extractions. Payment Options: DENTAL WORK IS FREE OF CHARGE. Bring proof of income or support. Best way to get seen: Arrive at 5:15 pm - this is a lottery, NOT first come/first serve, so arriving earlier will not increase your chances of being seen.     [facility] [phone] Select option 1 for emergencies   Location: [facility], [place], [address] [place] Clinic Hours: No walk-ins accepted - call the day before to schedule an appointment. Check in times are 9:30 am and 1:30 pm. Services: Simple extractions, temporary fillings, pulpectomy/pulp debridement, uncomplicated abscess drainage. Payment Options: PAYMENT IS DUE AT THE TIME OF SERVICE.  Fee is usually $100-200, additional surgical procedures (e.g. abscess drainage) may be extra. Cash, checks,  Visa/MasterCard accepted.  Can file Medicaid if patient is covered for dental - patient should call case worker to check. No discount for [facility] patients. Best way to get seen: MUST call the day before and get onto the schedule. Can usually be seen the next 1-2 days. No walk-ins accepted.     [facility] [phone]   Location: [facility], [address], [place] Clinic Hours: M, W, Th, F 8am or 1:30pm, Tues 9a or 1:30 - first come/first served. Services: Simple extractions, temporary fillings, uncomplicated abscess drainage.  You do not need to be an [facility] resident. Payment Options: PAYMENT IS DUE AT THE TIME OF SERVICE. Dental insurance, otherwise sliding scale - bring proof of income or support. Depending on income and treatment needed, cost is usually $50-200. Best way to get seen: Arrive early as it is first come/first served.     [facility] [phone]   Location: [address] Clinic Hours: Mon-Thu 8a-5p Services: Most basic dental services including extractions and fillings. Payment Options: PAYMENT IS DUE AT THE TIME OF SERVICE. Sliding scale, up to 50% off - bring proof if income or support. Medicaid with dental option accepted. Best way to get seen: Call to schedule an appointment, can usually be seen within 2 weeks OR they will try to see walk-ins - show up at 8a or 2p (you may have to wait).     [facility] [phone] [place] RESIDENTS ONLY   Location: [facility], [address], [place], [place] [address] Clinic Hours: By appointment only. Monday - Thursday 8am-5pm, Friday 8am-12pm Services: Cleanings, fillings, extractions. Payment Options: PAYMENT  IS DUE AT THE TIME OF SERVICE. Cash, Visa or MasterCard. Sliding scale - $30 minimum per service. Best way to get seen: Come in to office, complete packet and  make an appointment - need proof of income or support monies for each household member and proof of [facility] residence. Usually takes about a month to get in.     [facility] [phone]   Location: [address], [facility] Clinic Hours: Walk-in Urgent Care Dental Services are offered Monday-Friday mornings only. The numbers of emergencies accepted daily is limited to the number of providers available. Maximum 15 - Mondays, Wednesdays & Thursdays Maximum 10 - Tuesdays & Fridays Services: You do not need to be a [facility] resident to be seen for a dental emergency. Emergencies are defined as pain, swelling, abnormal bleeding, or dental trauma. Walkins will receive x-rays if needed. NOTE: Dental cleaning is not an emergency. Payment Options: PAYMENT IS DUE AT THE TIME OF SERVICE. Minimum co-pay is $40.00 for uninsured patients. Minimum co-pay is $3.00 for Medicaid with dental coverage. Dental Insurance is accepted and must be presented at time of visit. Medicare does not cover dental. Forms of payment: Cash, credit card, checks. Best way to get seen: If not previously registered with the clinic, walk-in dental registration begins at 7:15 am and is on a first come/first serve basis. If previously registered with the clinic, call to make an appointment.     [facility] [phone] [place] RESIDENTS ONLY   Location: [address] [place], [place] Clinic Hours: Mon-Thu 10a-2p Services: Extractions only! Payment Options: FREE (donations accepted) - bring proof of income or support Best way to get seen: Call and schedule an appointment OR come at 8am on the 1st Monday of every month (except for holidays) when it is first come/first served.     [facility] [phone]   Location: [address] [place], [place] Clinic Hours: Friday mornings Services, Payment Options, Best way to get seen: Call for info

## 2023-06-25 NOTE — ED Triage Notes (Signed)
Patient presents with bilateral ear discomfort  and dizziness x 2 days.

## 2023-06-25 NOTE — ED Notes (Signed)
 Discharged by Feliz Beam RN

## 2023-08-01 ENCOUNTER — Encounter: Payer: Self-pay | Admitting: Emergency Medicine

## 2023-08-01 ENCOUNTER — Ambulatory Visit
Admission: EM | Admit: 2023-08-01 | Discharge: 2023-08-01 | Disposition: A | Discharge status: Home / Self Care | Payer: 59 | Attending: Emergency Medicine | Admitting: Emergency Medicine

## 2023-08-01 DIAGNOSIS — J441 Chronic obstructive pulmonary disease with (acute) exacerbation: Secondary | ICD-10-CM | POA: Diagnosis present

## 2023-08-01 DIAGNOSIS — J069 Acute upper respiratory infection, unspecified: Secondary | ICD-10-CM

## 2023-08-01 HISTORY — DX: Chronic obstructive pulmonary disease, unspecified: J44.9

## 2023-08-01 LAB — RESP PANEL BY RT-PCR (FLU A&B, COVID) ARPGX2
Influenza A by PCR: NEGATIVE
Influenza B by PCR: NEGATIVE
SARS Coronavirus 2 by RT PCR: NEGATIVE

## 2023-08-01 LAB — GROUP A STREP BY PCR: Group A Strep by PCR: NOT DETECTED

## 2023-08-01 MED ORDER — AMOXICILLIN-POT CLAVULANATE 875-125 MG PO TABS: 1.0000 | ORAL_TABLET | Freq: Two times a day (BID) | ORAL | 0 refills | Status: AC

## 2023-08-01 MED ORDER — AEROCHAMBER MV MISC: 2 refills | Status: DC

## 2023-08-01 MED ORDER — PROMETHAZINE-DM 6.25-15 MG/5ML PO SYRP: 5.0000 mL | ORAL_SOLUTION | Freq: Four times a day (QID) | ORAL | 0 refills | Status: DC | PRN

## 2023-08-01 MED ORDER — IPRATROPIUM BROMIDE 0.06 % NA SOLN: 2.0000 | Freq: Four times a day (QID) | NASAL | 12 refills | Status: DC

## 2023-08-01 MED ORDER — ALBUTEROL SULFATE HFA 108 (90 BASE) MCG/ACT IN AERS
2.0000 | INHALATION_SPRAY | RESPIRATORY_TRACT | 0 refills | Status: AC | PRN
Start: 2023-08-01 — End: ?

## 2023-08-01 MED ORDER — BENZONATATE 100 MG PO CAPS: 200.0000 mg | ORAL_CAPSULE | Freq: Three times a day (TID) | ORAL | 0 refills | Status: DC

## 2023-08-01 NOTE — ED Provider Notes (Signed)
 [facility]    CSN: [id number] Arrival date & time: 08/01/23  1033      History   Chief Complaint Chief Complaint  Patient presents with   Cough   Sore Throat    HPI Natalie Rose is a 43 y.o. female.   HPI  43 year old female with past medical history significant for diabetes, depression, anxiety, migraine headaches, arthritis, and COPD presents for evaluation of respiratory symptoms that have been going on for the past week which include chills, runny nose, nasal congestion for clear nasal discharge, nonproductive cough, shortness breath, and wheezing.  The patient has been using her albuterol inhaler but reports only mild improvement of her symptoms.  She is still smoking.  She developed a sore throat and swollen lymph nodes on both side of her neck last night which brought her in to be evaluated.  She denies any fever or ear pain.  Past Medical History:  Diagnosis Date   Anxiety    Arthritis    COPD (chronic obstructive pulmonary disease) (HCC)    Depression    Diabetes mellitus without complication (HCC)    Mental health disorder    Migraines     Patient Active Problem List   Diagnosis Date Noted   Mood disorder (HCC) 01/15/2023   Intentional drug overdose (HCC) 01/11/2023   OCD (obsessive compulsive disorder) 09/16/2022   Panic attack 06/25/2022   Self-inflicted laceration of wrist (HCC) 06/17/2018   Cannabis abuse 06/17/2018   Borderline personality disorder (HCC) 09/20/2012   PTSD (post-traumatic stress disorder) 09/20/2012    Past Surgical History:  Procedure Laterality Date   MOUTH SURGERY      OB History   No obstetric history on file.      Home Medications    Prior to Admission medications   Medication Sig Start Date End Date Taking? Authorizing Provider  albuterol (VENTOLIN HFA) 108 (90 Base) MCG/ACT inhaler Inhale 2 puffs into the lungs every 4 (four) hours as needed. 08/01/23  Yes Becky Augusta, NP   amoxicillin-clavulanate (AUGMENTIN) 875-125 MG tablet Take 1 tablet by mouth every 12 (twelve) hours for 7 days. 08/01/23 08/08/23 Yes Becky Augusta, NP  atenolol (TENORMIN) 50 MG tablet Take 1 tablet (50 mg total) by mouth daily. 09/21/22  Yes Clapacs, [name], MD  baclofen (LIORESAL) 10 MG tablet Take 1 tablet (10 mg total) by mouth 3 (three) times daily. 05/08/23  Yes Becky Augusta, NP  benzonatate (TESSALON) 100 MG capsule Take 2 capsules (200 mg total) by mouth every 8 (eight) hours. 08/01/23  Yes Becky Augusta, NP  clonazePAM (KLONOPIN) 0.5 MG tablet Take 1 tablet (0.5 mg total) by mouth 2 (two) times daily. 09/21/22  Yes Clapacs, [name], MD  gabapentin (NEURONTIN) 300 MG capsule Take 1 capsule (300 mg total) by mouth 3 (three) times daily. 09/21/22  Yes Clapacs, [name], MD  ipratropium (ATROVENT) 0.06 % nasal spray Place 2 sprays into both nostrils 4 (four) times daily. 08/01/23  Yes Becky Augusta, NP  LYBALVI 10-10 MG TABS Take 1 tablet by mouth daily. 12/20/22  Yes [provider]  metFORMIN (GLUCOPHAGE) 500 MG tablet Take 1 tablet (500 mg total) by mouth 2 (two) times daily with a meal. 09/21/22  Yes Clapacs, [name], MD  mirtazapine (REMERON) 45 MG tablet Take 1 tablet (45 mg total) by mouth at bedtime. 09/21/22  Yes Clapacs, [name], MD  prazosin (MINIPRESS) 2 MG capsule Take 2 capsules (4 mg total) by mouth at bedtime. 09/21/22  Yes Clapacs, [name], MD  prazosin (MINIPRESS) 5 MG capsule Take 5 mg by mouth at bedtime. 12/26/22  Yes [provider]  promethazine-dextromethorphan (PROMETHAZINE-DM) 6.25-15 MG/5ML syrup Take 5 mLs by mouth 4 (four) times daily as needed. 08/01/23  Yes Becky Augusta, NP  propranolol (INDERAL) 10 MG tablet Take 1 tablet (10 mg total) by mouth 3 (three) times daily. 09/21/22  Yes Clapacs, [name], MD  sertraline (ZOLOFT) 50 MG tablet Take 3 tablets (150 mg total) by mouth daily. 09/21/22  Yes Clapacs, [name], MD  Spacer/Aero-Holding Chambers (AEROCHAMBER MV) inhaler  Use as instructed 08/01/23  Yes Becky Augusta, NP  ferrous sulfate (FEROSUL) 325 (65 FE) MG tablet Take 325 mg by mouth daily with breakfast.  04/29/20  [provider]  cyclobenzaprine (FLEXERIL) 10 MG tablet Take 1 tablet (10 mg total) by mouth at bedtime. 06/25/23   Domenick Gong, MD  fluticasone (FLONASE) 50 MCG/ACT nasal spray Place 2 sprays into both nostrils daily. 03/04/23   Domenick Gong, MD  hydrOXYzine (ATARAX) 50 MG tablet Take by mouth. 02/20/23   [provider]  ibuprofen (ADVIL) 600 MG tablet Take 1 tablet (600 mg total) by mouth every 6 (six) hours as needed. 06/25/23   Domenick Gong, MD  ondansetron (ZOFRAN) 4 MG tablet Take 1 tablet (4 mg total) by mouth daily as needed for nausea or vomiting. 01/11/23 01/11/24  Pilar Jarvis, MD  QUEtiapine (SEROQUEL) 50 MG tablet Take 1 tablet (50 mg total) by mouth at bedtime. 01/16/23 02/15/23  Charm Rings, NP  traZODone (DESYREL) 100 MG tablet Take 1 tablet (100 mg total) by mouth at bedtime. 09/21/22   Clapacs, [name], MD  eszopiclone (LUNESTA) 2 MG TABS tablet Take 2 mg by mouth at bedtime as needed for sleep. Take immediately before bedtime  04/29/20  [provider]  FLUoxetine (PROZAC) 40 MG capsule Take 40 mg by mouth daily.  10/08/19  [provider]  lurasidone (LATUDA) 40 MG TABS tablet Take 40 mg by mouth daily with breakfast.  10/08/19  [provider]    Family History Family History  Problem Relation Age of Onset   Osteopenia Mother    Heart failure Mother    Osteoarthritis Mother    Depression Mother    Diabetes Father     Social History Social History   Tobacco Use   Smoking status: Every Day    Current packs/day: 1.00    Average packs/day: 1 pack/day for 21.2 years (21.2 ttl pk-yrs)    Types: Cigarettes    Start date: 2004   Smokeless tobacco: Never  Vaping Use   Vaping status: Never Used  Substance Use Topics   Alcohol use: Not Currently   Drug use: Yes     Frequency: 3.0 times per week    Types: Marijuana     Allergies   Diazepam and Lurasidone   Review of Systems Review of Systems  Constitutional:  Negative for fever.  HENT:  Positive for congestion, rhinorrhea and sore throat. Negative for ear pain.   Respiratory:  Positive for cough, shortness of breath and wheezing.   Hematological:  Positive for adenopathy.     Physical Exam Triage Vital Signs ED Triage Vitals  Encounter Vitals Group     BP      Systolic BP Percentile      Diastolic BP Percentile      Pulse      Resp      Temp  Temp src      SpO2      Weight      Height      Head Circumference      Peak Flow      Pain Score      Pain Loc      Pain Education      Exclude from Growth Chart    No data found.  Updated Vital Signs BP 122/85 (BP Location: Right Arm)   Pulse 74   Temp 98.6 F (37 C) (Oral)   Resp 16   LMP 07/08/2023 (Approximate)   SpO2 94% Comment: hx of COPD  Visual Acuity Right Eye Distance:   Left Eye Distance:   Bilateral Distance:    Right Eye Near:   Left Eye Near:    Bilateral Near:     Physical Exam Vitals and nursing note reviewed.  Constitutional:      Appearance: Normal appearance. She is not ill-appearing.  HENT:     Head: Normocephalic and atraumatic.     Right Ear: Tympanic membrane, ear canal and external ear normal. There is no impacted cerumen.     Left Ear: Tympanic membrane, ear canal and external ear normal. There is no impacted cerumen.     Nose: Congestion and rhinorrhea present.     Comments: Nasal mucosa is erythematous and edematous with clear discharge in both nares.    Mouth/Throat:     Mouth: Mucous membranes are moist.     Pharynx: Oropharynx is clear. No oropharyngeal exudate or posterior oropharyngeal erythema.  Neck:     Comments: Bilateral anterior, tender cervical lymphadenopathy present. Cardiovascular:     Rate and Rhythm: Normal rate and regular rhythm.     Pulses: Normal pulses.      Heart sounds: Normal heart sounds. No murmur heard.    No friction rub. No gallop.  Pulmonary:     Effort: Pulmonary effort is normal.     Breath sounds: Wheezing present. No rhonchi or rales.     Comments: Patient had a single expiratory wheeze in the left upper lobe that cleared after subsequent respirations.  Remainder the lung fields are clear to auscultation. Musculoskeletal:     Cervical back: Normal range of motion and neck supple. Tenderness present.  Lymphadenopathy:     Cervical: Cervical adenopathy present.  Skin:    General: Skin is warm and dry.     Capillary Refill: Capillary refill takes less than 2 seconds.     Findings: No rash.  Neurological:     General: No focal deficit present.     Mental Status: She is alert and oriented to person, place, and time.      UC Treatments / Results  Labs (all labs ordered are listed, but only abnormal results are displayed) Labs Reviewed  GROUP A STREP BY PCR  RESP PANEL BY RT-PCR (FLU A&B, COVID) [id number]    EKG   Radiology No results found.  Procedures Procedures (including critical care time)  Medications Ordered in UC Medications - No data to display  Initial Impression / Assessment and Plan / UC Course  I have reviewed the triage vital signs and the nursing notes.  Pertinent labs & imaging results that were available during my care of the patient were reviewed by me and considered in my medical decision making (see chart for details).   Patient is a pleasant, nontoxic-appearing 43 year old female presenting for evaluation of respiratory symptoms as outlined HPI above.  She does have  a history of COPD and is experiencing a nonproductive cough.  She is also experiencing shortness of breath and wheezing, though she is able to speak in full sentence with dyspnea or tachypnea.  Respiratory rate at triage was 16 with an oxygen saturation of 94%.  The patient has been using her albuterol inhaler with mild improvement of  symptoms.  She is still smoking.  The remainder of her physical exam does reveal inflammation of her upper respiratory tract.  Differential diagnose include COVID, influenza, viral respiratory illness, strep pharyngitis.  I will order a strep PCR as well as a COVID and flu PCR.  Strep PCR is negative.  Respiratory panel is negative for COVID and influenza.  I will discharge patient on the diagnosis of URI with cough and congestion and COPD exacerbation.  Since she has been experiencing symptoms for over a week trial of antibiotics is warranted.  I will discharge her home on Augmentin 875 mg twice daily with food for 7 days for treatment of her upper respiratory infection.  Atrovent nasal spray for nasal congestion.  Tessalon Perles and Promethazine DM cough syrup for cough and congestion.  I will have her continue to use her albuterol inhaler, with the spacer, 1 to 2 puffs every 4-6 hours as needed for shortness of breath or wheezing.  Additionally, I will discharge her home on 60 mg of prednisone daily has burst dose to help with her pulmonary inflammation.  She should start this tomorrow morning at breakfast time.  Return precautions reviewed.   Final Clinical Impressions(s) / UC Diagnoses   Final diagnoses:  URI with cough and congestion  COPD exacerbation [facility])     Discharge Instructions      Your testing was negative for COVID, influenza, and strep.  I do feel that you have a respiratory infection which is causing your symptoms.  Since you have had symptoms for over a week a trial of antibiotics is warranted.  Take the Augmentin 875 mg twice daily with food for 7 days for treatment of your URI.  Use the albuterol inhaler, with the spacer, and take 1 to 2 puffs every 4-6 hours as needed for shortness of breath or wheezing.  Consider stopping smoking.  Use over-the-counter Tylenol and/or ibuprofen as needed for any fever or pain.  Use the Atrovent nasal spray, 2 squirts in each  nostril every 6 hours, as needed for runny nose and postnasal drip.  Use the Tessalon Perles every 8 hours during the day.  Take them with a small sip of water.  They may give you some numbness to the base of your tongue or a metallic taste in your mouth, this is normal.  Use the Promethazine DM cough syrup at bedtime for cough and congestion.  It will make you drowsy so do not take it during the day.  Return for reevaluation or see your primary care provider for any new or worsening symptoms.      ED Prescriptions     Medication Sig Dispense Auth. Provider   Spacer/Aero-Holding Chambers (AEROCHAMBER MV) inhaler Use as instructed 1 each Becky Augusta, NP   albuterol (VENTOLIN HFA) 108 (90 Base) MCG/ACT inhaler Inhale 2 puffs into the lungs every 4 (four) hours as needed. 18 g Becky Augusta, NP   amoxicillin-clavulanate (AUGMENTIN) 875-125 MG tablet Take 1 tablet by mouth every 12 (twelve) hours for 7 days. 14 tablet Becky Augusta, NP   benzonatate (TESSALON) 100 MG capsule Take 2 capsules (200 mg total) by  mouth every 8 (eight) hours. 21 capsule Becky Augusta, NP   ipratropium (ATROVENT) 0.06 % nasal spray Place 2 sprays into both nostrils 4 (four) times daily. 15 mL Becky Augusta, NP   promethazine-dextromethorphan (PROMETHAZINE-DM) 6.25-15 MG/5ML syrup Take 5 mLs by mouth 4 (four) times daily as needed. 118 mL Becky Augusta, NP      PDMP not reviewed this encounter.   Becky Augusta, NP 08/01/23 1210

## 2023-08-01 NOTE — ED Triage Notes (Signed)
 Cold for about a week  Sore throat and swollen gland started last night. No productive cough for about a week.

## 2023-08-01 NOTE — Discharge Instructions (Signed)
 Your testing was negative for COVID, influenza, and strep.  I do feel that you have a respiratory infection which is causing your symptoms.  Since you have had symptoms for over a week a trial of antibiotics is warranted.  Take the Augmentin 875 mg twice daily with food for 7 days for treatment of your URI.  Use the albuterol inhaler, with the spacer, and take 1 to 2 puffs every 4-6 hours as needed for shortness of breath or wheezing.  Consider stopping smoking.  Use over-the-counter Tylenol and/or ibuprofen as needed for any fever or pain.  Use the Atrovent nasal spray, 2 squirts in each nostril every 6 hours, as needed for runny nose and postnasal drip.  Use the Tessalon Perles every 8 hours during the day.  Take them with a small sip of water.  They may give you some numbness to the base of your tongue or a metallic taste in your mouth, this is normal.  Use the Promethazine DM cough syrup at bedtime for cough and congestion.  It will make you drowsy so do not take it during the day.  Return for reevaluation or see your primary care provider for any new or worsening symptoms.

## 2023-09-12 ENCOUNTER — Encounter: Payer: Self-pay | Admitting: Emergency Medicine

## 2023-09-12 ENCOUNTER — Ambulatory Visit: Admission: EM | Admit: 2023-09-12 | Discharge: 2023-09-12 | Attending: Family Medicine | Admitting: Family Medicine

## 2023-09-12 DIAGNOSIS — R1010 Upper abdominal pain, unspecified: Secondary | ICD-10-CM | POA: Diagnosis not present

## 2023-09-12 DIAGNOSIS — R112 Nausea with vomiting, unspecified: Secondary | ICD-10-CM

## 2023-09-12 LAB — GLUCOSE, CAPILLARY: Glucose-Capillary: 183 mg/dL — ABNORMAL HIGH (ref 70–99)

## 2023-09-12 MED ORDER — ONDANSETRON HCL 4 MG/2ML IJ SOLN
4.0000 mg | Freq: Once | INTRAMUSCULAR | Status: AC
  Administered 2023-09-12: 4 mg via INTRAMUSCULAR

## 2023-09-12 NOTE — Discharge Instructions (Signed)

## 2023-09-12 NOTE — ED Notes (Signed)
 Patient is being discharged from the Urgent Care and sent to the Emergency Department via personal vehicle. Per Dr. Rachael Darby, patient is in need of higher level of care due to abdominal pain. Patient is aware and verbalizes understanding of plan of care.  Vitals:   09/12/23 1110  BP: (!) 151/90  Pulse: 85  Resp: 18  Temp: 98.9 F (37.2 C)  SpO2: 98%

## 2023-09-12 NOTE — ED Triage Notes (Signed)
 Pt presents with sharp abdominal pain and vomiting that started this morning.

## 2023-09-12 NOTE — ED Provider Notes (Addendum)
 [facility]    CSN: [id number] Arrival date & time: 09/12/23  1058      History   Chief Complaint Chief Complaint  Patient presents with   Emesis   Abdominal Pain    HPI Natalie Rose is a 43 y.o. female.   HPI  Natalie Rose presents for upper abdominal pain that started around 930 AM after eating [employer] biscuit and coffee. Had nausea and vomited. Mom reports vomiting 8-9 times.  Se has been "sweating profusely." She is currently menstruating.  There was some urinary incontinence when she vomited and there was some blood in it.  Mom believes the blood was from her period and not from the emesis.  She has been holding her stomach since she started having pain.   Past Surgeries: no previous abdominal surgeries   Symptoms Nausea/Vomiting: no  Diarrhea: no  Constipation: no  Melena/BRBPR: no  Hematemesis: mom believes the blood was due to the period (urinary leakage)  Fever/Chills: yes, chills  Dysuria: no      Past Medical History:  Diagnosis Date   Anxiety    Arthritis    COPD (chronic obstructive pulmonary disease) (HCC)    Depression    Diabetes mellitus without complication (HCC)    Mental health disorder    Migraines     Patient Active Problem List   Diagnosis Date Noted   Mood disorder (HCC) 01/15/2023   Intentional drug overdose (HCC) 01/11/2023   OCD (obsessive compulsive disorder) 09/16/2022   Panic attack 06/25/2022   Self-inflicted laceration of wrist (HCC) 06/17/2018   Cannabis abuse 06/17/2018   Borderline personality disorder (HCC) 09/20/2012   PTSD (post-traumatic stress disorder) 09/20/2012    Past Surgical History:  Procedure Laterality Date   MOUTH SURGERY      OB History   No obstetric history on file.      Home Medications    Prior to Admission medications   Medication Sig Start Date End Date Taking? Authorizing Provider  gabapentin (NEURONTIN) 300 MG capsule Take 1 capsule (300 mg total) by mouth 3  (three) times daily. 09/21/22  Yes Clapacs, Elida Grounds, MD  LYBALVI 10-10 MG TABS Take 1 tablet by mouth daily. 12/20/22  Yes [provider]  metFORMIN (GLUCOPHAGE) 500 MG tablet Take 1 tablet (500 mg total) by mouth 2 (two) times daily with a meal. 09/21/22  Yes Clapacs, Elida Grounds, MD  mirtazapine (REMERON) 45 MG tablet Take 1 tablet (45 mg total) by mouth at bedtime. 09/21/22  Yes Clapacs, Elida Grounds, MD  prazosin (MINIPRESS) 2 MG capsule Take 2 capsules (4 mg total) by mouth at bedtime. 09/21/22  Yes Clapacs, Elida Grounds, MD  prazosin (MINIPRESS) 5 MG capsule Take 5 mg by mouth at bedtime. 12/26/22  Yes [provider]  QUEtiapine (SEROQUEL) 50 MG tablet Take 1 tablet (50 mg total) by mouth at bedtime. 01/16/23 09/12/23 Yes Lissa Riding, NP  sertraline (ZOLOFT) 50 MG tablet Take 3 tablets (150 mg total) by mouth daily. 09/21/22  Yes Clapacs, Elida Grounds, MD  ferrous sulfate (FEROSUL) 325 (65 FE) MG tablet Take 325 mg by mouth daily with breakfast.  04/29/20  [provider]  albuterol (VENTOLIN HFA) 108 (90 Base) MCG/ACT inhaler Inhale 2 puffs into the lungs every 4 (four) hours as needed. 08/01/23   Kent Pear, NP  atenolol (TENORMIN) 50 MG tablet Take 1 tablet (50 mg total) by mouth daily. 09/21/22   Clapacs, Elida Grounds, MD  baclofen (LIORESAL) 10 MG tablet  Take 1 tablet (10 mg total) by mouth 3 (three) times daily. 05/08/23   Kent Pear, NP  benzonatate (TESSALON) 100 MG capsule Take 2 capsules (200 mg total) by mouth every 8 (eight) hours. 08/01/23   Kent Pear, NP  clonazePAM (KLONOPIN) 0.5 MG tablet Take 1 tablet (0.5 mg total) by mouth 2 (two) times daily. 09/21/22   Clapacs, Elida Grounds, MD  cyclobenzaprine (FLEXERIL) 10 MG tablet Take 1 tablet (10 mg total) by mouth at bedtime. 06/25/23   Mortenson, Ashley, MD  fluticasone (FLONASE) 50 MCG/ACT nasal spray Place 2 sprays into both nostrils daily. 03/04/23   Ethlyn Herd, MD  hydrOXYzine (ATARAX) 50 MG tablet Take by mouth. 02/20/23   [provider]  ibuprofen (ADVIL) 600 MG tablet Take 1 tablet (600 mg total) by mouth every 6 (six) hours as needed. 06/25/23   Mortenson, Ashley, MD  ipratropium (ATROVENT) 0.06 % nasal spray Place 2 sprays into both nostrils 4 (four) times daily. 08/01/23   Kent Pear, NP  ondansetron (ZOFRAN) 4 MG tablet Take 1 tablet (4 mg total) by mouth daily as needed for nausea or vomiting. 01/11/23 01/11/24  Buell Carmin, MD  promethazine-dextromethorphan (PROMETHAZINE-DM) 6.25-15 MG/5ML syrup Take 5 mLs by mouth 4 (four) times daily as needed. 08/01/23   Kent Pear, NP  propranolol (INDERAL) 10 MG tablet Take 1 tablet (10 mg total) by mouth 3 (three) times daily. 09/21/22   Clapacs, Elida Grounds, MD  Spacer/Aero-Holding Chambers (AEROCHAMBER MV) inhaler Use as instructed 08/01/23   Kent Pear, NP  traZODone (DESYREL) 100 MG tablet Take 1 tablet (100 mg total) by mouth at bedtime. 09/21/22   Clapacs, Elida Grounds, MD  eszopiclone (LUNESTA) 2 MG TABS tablet Take 2 mg by mouth at bedtime as needed for sleep. Take immediately before bedtime  04/29/20  [provider]  FLUoxetine (PROZAC) 40 MG capsule Take 40 mg by mouth daily.  10/08/19  [provider]  lurasidone (LATUDA) 40 MG TABS tablet Take 40 mg by mouth daily with breakfast.  10/08/19  [provider]    Family History Family History  Problem Relation Age of Onset   Osteopenia Mother    Heart failure Mother    Osteoarthritis Mother    Depression Mother    Diabetes Father     Social History Social History   Tobacco Use   Smoking status: Every Day    Current packs/day: 1.00    Average packs/day: 1 pack/day for 21.3 years (21.3 ttl pk-yrs)    Types: Cigarettes    Start date: 2004   Smokeless tobacco: Never  Vaping Use   Vaping status: Never Used  Substance Use Topics   Alcohol use: Not Currently   Drug use: Yes    Frequency: 3.0 times per week    Types: Marijuana     Allergies   Diazepam and Lurasidone   Review of  Systems Review of Systems :negative unless otherwise stated in HPI.      Physical Exam Triage Vital Signs ED Triage Vitals  Encounter Vitals Group     BP 09/12/23 1110 (!) 151/90     Systolic BP Percentile --      Diastolic BP Percentile --      Pulse Rate 09/12/23 1110 85     Resp 09/12/23 1110 18     Temp 09/12/23 1110 98.9 F (37.2 C)     Temp Source 09/12/23 1110 Oral     SpO2 09/12/23 1110 98 %  Weight --      Height --      Head Circumference --      Peak Flow --      Pain Score 09/12/23 1107 8     Pain Loc --      Pain Education --      Exclude from Growth Chart --    No data found.  Updated Vital Signs BP (!) 151/90 (BP Location: Left Arm)   Pulse 85   Temp 98.9 F (37.2 C) (Oral)   Resp 18   LMP 09/08/2023 (Approximate)   SpO2 98%   Visual Acuity Right Eye Distance:   Left Eye Distance:   Bilateral Distance:    Right Eye Near:   Left Eye Near:    Bilateral Near:     Physical Exam  GEN: Ill-appearing female diaphoretic and holding her abdomen CV: regular rate and rhythm RESP: no increased work of breathing, clear to ascultation bilaterally ABD: Bowel sounds present. Soft, right upper quadrant tenderness, unable to [facility] test due to acute pain, non-distended. No guarding, no rebound,no appreciable hepatosplenomegaly, no CVA tenderness MSK: no extremity edema SKIN: warm, dry, no rash on visible skin NEURO: alert, moves all extremities appropriately   UC Treatments / Results  Labs (all labs ordered are listed, but only abnormal results are displayed) Labs Reviewed  GLUCOSE, CAPILLARY - Abnormal; Notable for the following components:      Result Value   Glucose-Capillary 183 (*)    All other components within normal limits  CBG MONITORING, ED    EKG  If EKG performed, see my interpretation and MDM section  Radiology No results found.   Procedures Procedures (including critical care time)  Medications Ordered in UC Medications   ondansetron (ZOFRAN) injection 4 mg (4 mg Intramuscular Given 09/12/23 1139)    Initial Impression / Assessment and Plan / UC Course  I have reviewed the triage vital signs and the nursing notes.  Pertinent labs & imaging results that were available during my care of the patient were reviewed by me and considered in my medical decision making (see chart for details).       Patient is a  43 y.o. femalepresents after having insidious severe abdominal pain with nausea vomiting that started a couple hours ago.  Given Zofran 4 mg IM.  On chart review, she had a CT abdomen pelvis in August 2024 that showed mild hepatomegaly and hepatic steatosis with nonobstructive left renal calculi.  Overall, patient is ill-appearing.  She is hypertensive but not tachycardic.  Natalie Rose afebrile and satting well on room air.  Exam is concerning for possible acute abdomen.  She has significant right upper quadrant tenderness on exam.  She has history of type 2 diabetes.  CBG obtained showing mild hyperglycemia at 183.  I am concerned that she may have cholecystitis or pancreatitis.  Despite the Zofran given she is still very nauseous.  She likely needs a CT abdomen pelvis which is not available here.  Recommended ED evaluation.  Discussed EMS transport but mom declined as she would like the patient to [facility] ED [facility] of [facility].  A family friend will drive them to the hospital.  Discussed MDM, treatment plan and plan for follow-up with patient/parent who agrees with plan.    Final Clinical Impressions(s) / UC Diagnoses   Final diagnoses:  Pain of upper abdomen  Nausea and vomiting, unspecified vomiting type     Discharge Instructions  You have been advised to follow up immediately in the emergency department for concerning signs or symptoms as discussed during your visit. If you declined EMS transport, please have a family member take you directly to the ED at this time. Do not delay.    Based on concerns about condition, if you do not follow up in the ED, you may risk poor outcomes including worsening of condition, delayed treatment and potentially life threatening issues. If you have declined to go to the ED at this time, you should call your PCP immediately to set up a follow up appointment.   Go to ED for red flag symptoms, including; fevers you cannot reduce with Tylenol/Motrin, severe headaches, vision changes, numbness/weakness in part of the body, lethargy, confusion, intractable vomiting, severe dehydration, chest pain, breathing difficulty, severe persistent abdominal or pelvic pain, signs of severe infection (increased redness, swelling of an area), feeling faint or passing out, dizziness, etc. You should especially go to the ED for sudden acute worsening of condition if you do not elect to go at this time.       ED Prescriptions   None    PDMP not reviewed this encounter.       Kaleen Rochette, DO 09/19/23 0411

## 2023-09-13 NOTE — ED Triage Notes (Signed)
 Pt reports n/v starting x 2 days ago, diarrhea x 4 days. Pt reports chills.

## 2023-10-07 ENCOUNTER — Ambulatory Visit
Admission: EM | Admit: 2023-10-07 | Discharge: 2023-10-07 | Attending: Emergency Medicine | Admitting: Emergency Medicine

## 2023-10-07 DIAGNOSIS — R112 Nausea with vomiting, unspecified: Secondary | ICD-10-CM | POA: Diagnosis not present

## 2023-10-07 DIAGNOSIS — R1030 Lower abdominal pain, unspecified: Secondary | ICD-10-CM | POA: Insufficient documentation

## 2023-10-07 LAB — URINALYSIS, ROUTINE W REFLEX MICROSCOPIC
Bilirubin Urine: NEGATIVE
Glucose, UA: NEGATIVE mg/dL
Hgb urine dipstick: NEGATIVE
Ketones, ur: NEGATIVE mg/dL
Nitrite: NEGATIVE
Specific Gravity, Urine: 1.02 (ref 1.005–1.030)
pH: 7 (ref 5.0–8.0)

## 2023-10-07 LAB — URINALYSIS, MICROSCOPIC (REFLEX): RBC / HPF: NONE SEEN RBC/hpf (ref 0–5)

## 2023-10-07 LAB — GLUCOSE, CAPILLARY: Glucose-Capillary: 159 mg/dL — ABNORMAL HIGH (ref 70–99)

## 2023-10-07 LAB — PREGNANCY, URINE: Preg Test, Ur: NEGATIVE

## 2023-10-07 MED ORDER — ONDANSETRON 8 MG PO TBDP
8.0000 mg | ORAL_TABLET | Freq: Once | ORAL | Status: AC
  Administered 2023-10-07: 8 mg via ORAL

## 2023-10-07 MED ORDER — KETOROLAC TROMETHAMINE 30 MG/ML IJ SOLN
30.0000 mg | Freq: Once | INTRAMUSCULAR | Status: AC
  Administered 2023-10-07: 30 mg via INTRAMUSCULAR

## 2023-10-07 NOTE — ED Triage Notes (Signed)
 Pt states lower abdominal/pelvic pain onset 2 days ago. Pt states vomiting when trying to eat or have a BM. Last BM 3 days ago. Pt denies issues with urine.

## 2023-10-07 NOTE — ED Triage Notes (Addendum)
 Sx 2 days  Emesis Lower abdominal pain. LMP 09/08/2023. Time for period  Last BM 2 days ago. Feels like she has top go but can't.

## 2023-10-07 NOTE — Discharge Instructions (Signed)
 Your urine pregnancy is negative.  Your urinalysis was inconclusive for urinary tract infection.  Your blood sugar was 156.  I have given you 8 mg of Zofran  ODT and 30 mg of Toradol  IM.  I am most concerned about appendicitis or some other surgical emergency.  Please let them know if your symptoms get worse.  Do not have anything to eat or drink until your ER evaluation is complete.

## 2023-10-07 NOTE — ED Provider Notes (Signed)
 HPI  SUBJECTIVE:  Natalie Rose is a 43 y.o. female who presents with 2 days of stabbing, constant, nonmigratory, nonradiating lower abdominal pain, nausea, multiple episodes of nonbilious, nonbloody emesis starting yesterday.  Notes 3 episodes of emesis today.  States that she is tolerating liquids only.  She reports feeling hot with chills, dull bilateral low back pain.  She states that she has not had a bowel movement in 2-1/2 days, her last bowel movement was a small amount.  She normally stools every day.  No dysuria, urgency, frequency, cloudy odorous urine, hematuria, vaginal odor bleeding, discharge.  No abdominal distention.  She has not been sexually active in 6 to 7 months.  She states that she drinks 2 to 2-1/2 L of water per day.  No melena, hematochezia, rectal bleeding, anorexia.  No recent change in her medications.  She states that the car ride over here was painful.  She has not tried anything for her symptoms.  No alleviating factors.  Symptoms are worse when she tries to stool, with walking, movement, and eating.  She has a past medical history of diabetes, COPD, was told that she had nephrolithiasis but does not remember having an episode of nephrolithiasis.  No history of UTI, pyelonephritis, abdominal surgeries, constipation, gastroparesis, PID, BV, ovarian cyst, diverticulitis.  LMP: Beginning of April.  She is normally regular.  PCP: [facility]   Past Medical History:  Diagnosis Date   Anxiety    Arthritis    COPD (chronic obstructive pulmonary disease) (HCC)    Depression    Diabetes mellitus without complication (HCC)    Mental health disorder    Migraines     Past Surgical History:  Procedure Laterality Date   MOUTH SURGERY      Family History  Problem Relation Age of Onset   Osteopenia Mother    Heart failure Mother    Osteoarthritis Mother    Depression Mother    Diabetes Father     Social History   Tobacco Use   Smoking  status: Every Day    Current packs/day: 1.00    Average packs/day: 1 pack/day for 21.3 years (21.3 ttl pk-yrs)    Types: Cigarettes    Start date: 2004   Smokeless tobacco: Never  Vaping Use   Vaping status: Never Used  Substance Use Topics   Alcohol use: Not Currently   Drug use: Yes    Frequency: 3.0 times per week    Types: Marijuana    No current facility-administered medications for this encounter.  Current Outpatient Medications:    albuterol  (VENTOLIN  HFA) 108 (90 Base) MCG/ACT inhaler, Inhale 2 puffs into the lungs every 4 (four) hours as needed., Disp: 18 g, Rfl: 0   clonazePAM  (KLONOPIN ) 0.5 MG tablet, Take 1 tablet (0.5 mg total) by mouth 2 (two) times daily., Disp: 60 tablet, Rfl: 1   gabapentin  (NEURONTIN ) 300 MG capsule, Take 1 capsule (300 mg total) by mouth 3 (three) times daily., Disp: 90 capsule, Rfl: 1   LYBALVI  10-10 MG TABS, Take 1 tablet by mouth daily., Disp: , Rfl:    metFORMIN  (GLUCOPHAGE ) 500 MG tablet, Take 1 tablet (500 mg total) by mouth 2 (two) times daily with a meal., Disp: 60 tablet, Rfl: 1   mirtazapine  (REMERON ) 45 MG tablet, Take 1 tablet (45 mg total) by mouth at bedtime., Disp: 30 tablet, Rfl: 1   ondansetron  (ZOFRAN ) 4 MG tablet, Take 1 tablet (4 mg total) by mouth daily as  needed for nausea or vomiting., Disp: 30 tablet, Rfl: 1   prazosin  (MINIPRESS ) 2 MG capsule, Take 2 capsules (4 mg total) by mouth at bedtime., Disp: 60 capsule, Rfl: 1   prazosin  (MINIPRESS ) 5 MG capsule, Take 5 mg by mouth at bedtime., Disp: , Rfl:    promethazine -dextromethorphan (PROMETHAZINE -DM) 6.25-15 MG/5ML syrup, Take 5 mLs by mouth 4 (four) times daily as needed., Disp: 118 mL, Rfl: 0   propranolol  (INDERAL ) 10 MG tablet, Take 1 tablet (10 mg total) by mouth 3 (three) times daily., Disp: 90 tablet, Rfl: 1   sertraline  (ZOLOFT ) 50 MG tablet, Take 3 tablets (150 mg total) by mouth daily., Disp: 90 tablet, Rfl: 1   Spacer/Aero-Holding Chambers (AEROCHAMBER MV) inhaler,  Use as instructed, Disp: 1 each, Rfl: 2   traZODone  (DESYREL ) 100 MG tablet, Take 1 tablet (100 mg total) by mouth at bedtime., Disp: 30 tablet, Rfl: 1   atenolol  (TENORMIN ) 50 MG tablet, Take 1 tablet (50 mg total) by mouth daily., Disp: 30 tablet, Rfl: 1   baclofen  (LIORESAL ) 10 MG tablet, Take 1 tablet (10 mg total) by mouth 3 (three) times daily., Disp: 30 each, Rfl: 0   cyclobenzaprine  (FLEXERIL ) 10 MG tablet, Take 1 tablet (10 mg total) by mouth at bedtime., Disp: 20 tablet, Rfl: 0   hydrOXYzine  (ATARAX ) 50 MG tablet, Take by mouth., Disp: , Rfl:    QUEtiapine  (SEROQUEL ) 50 MG tablet, Take 1 tablet (50 mg total) by mouth at bedtime., Disp: 30 tablet, Rfl: 0  Allergies  Allergen Reactions   Diazepam Hives, Anxiety, Itching, Other (See Comments) and Rash    unknown unknown    Lurasidone      ROS  As noted in HPI.   Physical Exam  BP (!) 139/93 (BP Location: Left Arm)   Pulse 64   Temp 98.2 F (36.8 C) (Oral)   Resp 18   LMP 09/08/2023 (Approximate)   SpO2 97%   Constitutional: Well developed, well nourished, appears uncomfortable Eyes: PERRL, EOMI, conjunctiva normal bilaterally HENT: Normocephalic, atraumatic,mucus membranes moist Respiratory: Normal respiratory effort Cardiovascular: Normal rate and rhythm, no murmurs, no gallops, no rubs GI: Soft, nondistended, normal bowel sounds, diffuse abdominal tenderness maximal in the right lower quadrant.  Positive Rovsing.  Negative Murphy.  Positive tap table test Back: Bilateral mild CVAT.  Mild right paralumbar tenderness. skin: No rash, skin intact Musculoskeletal: No edema, no tenderness, no deformities Neurologic: Alert & oriented x 3, CN III-XII grossly intact, no motor deficits, sensation grossly intact Psychiatric: Speech and behavior appropriate   ED Course   Medications  ketorolac  (TORADOL ) 30 MG/ML injection 30 mg (30 mg Intramuscular Given 10/07/23 1230)  ondansetron  (ZOFRAN -ODT) disintegrating tablet 8 mg  (8 mg Oral Given 10/07/23 1231)    Orders Placed This Encounter  Procedures   Urinalysis, Routine w reflex microscopic -Urine, Clean Catch    Standing Status:   Standing    Number of Occurrences:   1    Specimen Source:   Urine, Clean Catch [76]   Pregnancy, urine    Standing Status:   Standing    Number of Occurrences:   1   Urinalysis, Microscopic (reflex)    Standing Status:   Standing    Number of Occurrences:   1   Glucose, capillary    Standing Status:   Standing    Number of Occurrences:   1   CBG monitoring, ED    Standing Status:   Standing    Number of Occurrences:  1   Results for orders placed or performed during the hospital encounter of 10/07/23 (from the past 24 hours)  Urinalysis, Routine w reflex microscopic -Urine, Clean Catch     Status: Abnormal   Collection Time: 10/07/23 11:59 AM  Result Value Ref Range   Color, Urine YELLOW YELLOW   APPearance CLEAR CLEAR   Specific Gravity, Urine 1.020 1.005 - 1.030   pH 7.0 5.0 - 8.0   Glucose, UA NEGATIVE NEGATIVE mg/dL   Hgb urine dipstick NEGATIVE NEGATIVE   Bilirubin Urine NEGATIVE NEGATIVE   Ketones, ur NEGATIVE NEGATIVE mg/dL   Protein, ur TRACE (A) NEGATIVE mg/dL   Nitrite NEGATIVE NEGATIVE   Leukocytes,Ua TRACE (A) NEGATIVE  Pregnancy, urine     Status: None   Collection Time: 10/07/23 11:59 AM  Result Value Ref Range   Preg Test, Ur NEGATIVE NEGATIVE  Urinalysis, Microscopic (reflex)     Status: Abnormal   Collection Time: 10/07/23 11:59 AM  Result Value Ref Range   RBC / HPF NONE SEEN 0 - 5 RBC/hpf   WBC, UA 0-5 0 - 5 WBC/hpf   Bacteria, UA FEW (A) NONE SEEN   Squamous Epithelial / HPF 6-10 0 - 5 /HPF  Glucose, capillary     Status: Abnormal   Collection Time: 10/07/23 12:34 PM  Result Value Ref Range   Glucose-Capillary 159 (H) 70 - 99 mg/dL   No results found.  ED Clinical Impression  1. Lower abdominal pain   2. Nausea and vomiting, unspecified vomiting type      ED  Assessment/Plan     Patient's exam is concerning for some peritoneal signs with the rebound and positive tap table test.  She appears very uncomfortable.  Highest in the differential is appendicitis.  She is not pregnant, her UA is inconclusive for urinary tract infection.  Also in the differential is obstipation, constipation, perforation, obstruction, nephrolithiasis, ovarian torsion, gastroparesis.  Doubt TOA, PID.  Giving Zofran  8 mg p.o. and Toradol  30 mg IM here.  I believe she needs to go to the ED for comprehensive workup, possible additional pain control.  Patient would like to go to [facility].  I believe she is stable to go by private vehicle.  Discussed labs, rationale for transfer to the emergency department with patient.  She agrees to go.  Meds ordered this encounter  Medications   ketorolac  (TORADOL ) 30 MG/ML injection 30 mg   ondansetron  (ZOFRAN -ODT) disintegrating tablet 8 mg      *This clinic note was created using Scientist, clinical (histocompatibility and immunogenetics). Therefore, there may be occasional mistakes despite careful proofreading. ?    Ethlyn Herd, MD 10/07/23 1240

## 2023-10-07 NOTE — ED Triage Notes (Signed)
 PT BIB POV for evaluation of abdominal pain and constipation c n/v.

## 2023-10-07 NOTE — ED Notes (Signed)
 Patient is being discharged from the Urgent Care and sent to the Emergency Department via POV . Per Dr.Mortenson, patient is in need of higher level of care due to lower abd pain. Patient is aware and verbalizes understanding of plan of care.  Vitals:   10/07/23 1133  BP: (!) 139/93  Pulse: 64  Resp: 18  Temp: 98.2 F (36.8 C)  SpO2: 97%

## 2023-10-07 NOTE — ED Triage Notes (Signed)
 Pt c/o RLQ pain and constipation. Pt seen at [facility] and was told to take miralax. Pt reports unable to keep it down due to N/V. Symptoms X 2.5 days

## 2023-10-08 NOTE — ED Provider Notes (Signed)
 [facility] Emergency Department Provider Note   History, MDM, ED Course   History: Natalie Rose is a 43 y.o. female with history of personality disorder, anxiety, depression, COPD, diabetes, PTSD, hyperlipidemia who presents to the emergency department with a chief complaints of nausea and vomiting ever since being discharged from the emergency department yesterday.  Patient states that she was seen yesterday in the emergency department for abdominal pain and constipation.  She had CT scan that was performed did not show any acute abnormality but did show moderate colonic stool burden.  She was administered an enema which produced a large bowel movement.  She was discharged instructions to take MiraLAX.  She states that she tried drinking the MiraLAX at home and it made her vomit.  Ever since initial episode of nausea and vomiting she has not been able to hold down p.o.  She denies any alcohol use.  She does smoke cigarettes daily.  Reports intermittent marijuana use.  Last marijuana use was approximate 1 week ago.  With this episode of nausea and vomiting she denies any abdominal pain, fevers, chills.  No hematemesis or coffee-ground emesis.  Pertinent exam: On exam patient is afebrile, minimally tachycardic, hemodynamically stable, saturating well on room air.  Generally the patient appears nontoxic and in no acute distress.  Cardiopulmonary exam is not reveal any abnormal heart or lung sounds.  Abdomen is soft and nontender.  Patient is warm and perfused.  Impression/Ddx: 43 year old female history and physical exam as above.  At time my evaluation the patient already had some laboratory studies returned including a CMP with a mild anion gap of 16 but otherwise normal.  CBC with leukocytosis of 16 however has had similar leukocytosis over the past several days.  Normal lipase, normal magnesium , not pregnant.  Urinalysis with evidence of ketosis, no significant evidence of infection.   Patient has received a dose of Zofran .  Will administer fluids and reassess.  May need to redose antiemetics.  If she truly is not able to tolerate p.o. intake after antiemetics may need to discuss admission with the patient given this is her third visit to the emergency department over the past several days.  No evidence of significant intra-abdominal process that would require CT imaging.  Suspect this is likely gastritis, gastroenteritis, GI dysmotility, anxiety, possible cannabis hyperemesis.  Diagnostic work-up as below: Orders Placed This Encounter  Procedures  . CBC w/ Differential  . Comprehensive Metabolic Panel  . Lipase Level  . Urinalysis with Microscopy with Culture Reflex  . Pregnancy Qualitative, Urine  . Magnesium  Level  . ECG 12 Lead  . Insert peripheral IV    Progress Notes: ED Course as of 10/09/23 2311  Wed Oct 09, 2023  [id number] Patient reassessed. Has tolerated crackers by mouth. Would like to try going home with zofran . I offered admission given this is her third visit to the emergency room over the past several days but she politely declined. Reviewed return precautions and patient will be discharged in a stable condition at this time.   __________________________________________________________________  The case was discussed with the attending physician who is in agreement with the above assessment and plan.  Additional Medical Decision Making   - Any discussion of this patient's case/presentation between myself and consultants, admitting teams, or other team members has been documented above. - Imaging and other studies, if performed, that were available during my care of the patient were independently reviewed and interpreted by me and considered in my  medical decision making as documented above. - External records reviewed: I reviewed the patient's emergency provider note from yesterday and on 10/07/2023. - Consideration of admission, observation, transfer, or  escalation of care: I discussed admission with the patient but she states that she would like to try going home with Zofran  which she had not been prescribed before.  Reviewed strict return precautions with the patient.  History   Chief Complaint:  Chief Complaint  Patient presents with  . Emesis    History of Present Illness:  As documented above.  Additional history during this encounter provided by: N/A  Past Medical History: Past Medical History:  Diagnosis Date  . Anxiety   . Arthritis   . COPD (chronic obstructive pulmonary disease)   . Depression   . Diabetes mellitus   . Headache   . Joint pain   . Moderate tetrahydrocannabinol (THC) dependence   . Personality disorder   . Smoking history     Medications:  No current facility-administered medications for this encounter.  Current Outpatient Medications:  .  alcohol swabs PadM, Use to check glucose daily DX: E11.8, Disp: 100 each, Rfl: 5 .  blood sugar diagnostic (GLUCOSE BLOOD) Strp, Use as directed to test fasting glucose Daily-DX: E11.8, Disp: 100 strip, Rfl: 3 .  blood-glucose meter kit, Use to check fasting glucose daily. DX: E11.8, Disp: 1 each, Rfl: 0 .  clonazePAM  (KLONOPIN ) 0.5 MG tablet, Take 1 tablet (0.5 mg total) by mouth., Disp: , Rfl:  .  famotidine  (PEPCID ) 20 MG tablet, Take 1 tablet (20 mg total) by mouth two (2) times a day for 10 days., Disp: 20 tablet, Rfl: 0 .  fluticasone  propionate (FLONASE ) 50 mcg/actuation nasal spray, 2 sprays into each nostril., Disp: , Rfl:  .  gabapentin  (NEURONTIN ) 300 MG capsule, Take 1 capsule (300 mg total) by mouth., Disp: , Rfl:  .  ibuprofen  (MOTRIN ) 600 MG tablet, Take 1 tablet (600 mg total) by mouth., Disp: , Rfl:  .  ibuprofen  (MOTRIN ) 600 MG tablet, , Disp: , Rfl:  .  lancets Misc, Use to test glucose daily DX: E11.8, Disp: 100 each, Rfl: 11 .  LYBALVI  10-10 mg Tab, 1 tablet nightly., Disp: , Rfl:  .  metFORMIN  (GLUCOPHAGE ) 500 MG tablet, TAKE ONE (1) TABLET  (500 MG TOTAL) BY MOUTH IN THE MORNING AND ONE (1) TABLET (500 MG TOTAL) IN THE EVENING. TAKE WITH MEALS., Disp: 180 tablet, Rfl: 1 .  mirtazapine  (REMERON ) 15 MG tablet, Take 3 tablets (45 mg total) by mouth nightly., Disp: , Rfl:  .  ondansetron  (ZOFRAN -ODT) 4 MG disintegrating tablet, Take 1 tablet (4 mg total) by mouth every eight (8) hours as needed for nausea for up to 7 days., Disp: 14 tablet, Rfl: 0 .  prazosin  (MINIPRESS ) 5 MG capsule, Take 1 capsule (5 mg total) by mouth nightly., Disp: , Rfl:  .  predniSONE  (DELTASONE ) 20 MG tablet, , Disp: , Rfl:  .  propranolol  (INDERAL ) 10 MG tablet, Take 1 tablet (10 mg total) by mouth Three (3) times a day., Disp: , Rfl:  .  QUEtiapine  (SEROQUEL ) 50 MG tablet, Take 1 tablet (50 mg total) by mouth Three (3) times a day., Disp: , Rfl:  .  sertraline  (ZOLOFT ) 100 MG tablet, Take 1.5 tablets (150 mg total) by mouth daily., Disp: 45 tablet, Rfl: 0 Discharge Medication List as of 10/09/2023  3:24 AM     START taking these medications   Details  ondansetron  (ZOFRAN -ODT) 4 MG  disintegrating tablet Take 1 tablet (4 mg total) by mouth every eight (8) hours as needed for nausea for up to 7 days., Starting Wed 10/09/2023, Until Wed 10/16/2023 at 2359, Normal       CONTINUE these medications which have NOT CHANGED   Details  alcohol swabs PadM Use to check glucose daily DX: E11.8, Normal    blood sugar diagnostic (GLUCOSE BLOOD) Strp Use as directed to test fasting glucose Daily-DX: E11.8, Normal    blood-glucose meter kit Use to check fasting glucose daily. DX: E11.8, Normal    clonazePAM  (KLONOPIN ) 0.5 MG tablet Take 1 tablet (0.5 mg total) by mouth., Starting Fri 09/21/2022, Historical Med    famotidine  (PEPCID ) 20 MG tablet Take 1 tablet (20 mg total) by mouth two (2) times a day for 10 days., Starting Thu 09/12/2023, Until Sun 09/22/2023, Normal    fluticasone  propionate (FLONASE ) 50 mcg/actuation nasal spray 2 sprays into each nostril., Starting Mon  03/04/2023, Historical Med    gabapentin  (NEURONTIN ) 300 MG capsule Take 1 capsule (300 mg total) by mouth., Starting Fri 09/21/2022, Historical Med    !! ibuprofen  (MOTRIN ) 600 MG tablet Take 1 tablet (600 mg total) by mouth., Starting Tue 06/25/2023, Historical Med    !! ibuprofen  (MOTRIN ) 600 MG tablet Historical Med    lancets Misc Use to test glucose daily DX: E11.8, Normal    LYBALVI  10-10 mg Tab 1 tablet nightly., Starting Fri 12/09/2020, Historical Med    metFORMIN  (GLUCOPHAGE ) 500 MG tablet TAKE ONE (1) TABLET (500 MG TOTAL) BY MOUTH IN THE MORNING AND ONE (1) TABLET (500 MG TOTAL) IN THE EVENING. TAKE WITH MEALS., Normal    mirtazapine  (REMERON ) 15 MG tablet Take 3 tablets (45 mg total) by mouth nightly., Historical Med    prazosin  (MINIPRESS ) 5 MG capsule Take 1 capsule (5 mg total) by mouth nightly., Starting Fri 04/14/2021, Historical Med    predniSONE  (DELTASONE ) 20 MG tablet Historical Med    propranolol  (INDERAL ) 10 MG tablet Take 1 tablet (10 mg total) by mouth Three (3) times a day., Starting Mon 07/02/2022, Historical Med    QUEtiapine  (SEROQUEL ) 50 MG tablet Take 1 tablet (50 mg total) by mouth Three (3) times a day., Starting Mon 07/02/2022, Historical Med    sertraline  (ZOLOFT ) 100 MG tablet Take 1.5 tablets (150 mg total) by mouth daily., Starting Wed 03/16/2020, Until Mon 03/25/2023, Normal     !! - Potential duplicate medications found. Please discuss with provider.     STOP taking these medications     sucralfate  (CARAFATE ) 1 gram tablet Comments:  Reason for Stopping:          Allergies:  Diazepam  and Latuda [lurasidone]  Past Surgical History:  Past Surgical History:  Procedure Laterality Date  . TOOTH EXTRACTION      Social History:  Social History   Tobacco Use  . Smoking status: Every Day    Current packs/day: 1.00    Types: Cigarettes  . Smokeless tobacco: Never  . Tobacco comments:    undecided on quitting  Substance Use Topics  .  Alcohol use: No    Alcohol/week: 0.0 standard drinks of alcohol    Family History: Family History  Problem Relation Age of Onset  . COPD Mother   . Heart disease Mother   . Heart failure Mother   . Aneurysm Mother   . Diabetes Father   . Neuropathy Sister   . Seizures Sister   . Learning disabilities Sister   .  No Known Problems Brother   . No Known Problems Maternal Aunt   . No Known Problems Maternal Uncle   . No Known Problems Paternal Aunt   . No Known Problems Paternal Uncle   . Leukemia Maternal Grandmother   . Suicidality Maternal Grandfather   . Glaucoma Paternal Grandmother   . Fibromyalgia Paternal Grandmother   . Diabetes Paternal Grandfather   . Heart disease Paternal Grandfather   . Anesthesia problems Neg Hx   . Broken bones Neg Hx   . Cancer Neg Hx   . Clotting disorder Neg Hx   . Collagen disease Neg Hx   . Dislocations Neg Hx   . Gout Neg Hx   . Hemophilia Neg Hx   . Osteoporosis Neg Hx   . Rheumatologic disease Neg Hx   . Scoliosis Neg Hx   . Severe sprains Neg Hx   . Sickle cell anemia Neg Hx   . Spinal Compression Fracture Neg Hx      Physical Exam   Vital Signs:   BP 152/98   Pulse 93   Temp 36.9 C (98.4 F) (Oral)   Resp 18   Ht 149.9 cm (4' 11)   Wt 85.7 kg (189 lb)   LMP 09/06/2023 (Approximate)   SpO2 94%   BMI 38.17 kg/m   General: Alert and oriented. Well-appearing and in no distress. Skin: Skin is warm and well-perfused. No rashes noted. HEENT: Normocephalic and atraumatic, moist mucous membranes, no nasal drainage, no intra-oral lesions or erythema. Lungs: Normal respiratory effort. Breath sounds clear bilaterally without wheezes, crackles, or rhonchi. Heart: Rate as above, regular rhythm. No murmurs appreciated. Abdomen: Soft and non-tender to palpation, non-distended. Extremities: No peripheral edema or clubbing. Pulses are normal and symmetric in upper and lower extremities. Neurological: Normal speech and language. No  gross focal neurologic deficits are appreciated. Psychiatric: Mood and affect are normal. Normal speech and behavior.   Radiology   No orders to display    Labs   Labs Reviewed  COMPREHENSIVE METABOLIC PANEL - Abnormal; Notable for the following components:      Result Value   Anion Gap 16 (*)    All other components within normal limits  CBC W/ AUTO DIFF - Abnormal; Notable for the following components:   WBC 16.0 (*)    RDW 16.7 (*)    Absolute Neutrophils 12.5 (*)    Absolute Monocytes 0.9 (*)    All other components within normal limits  URINALYSIS WITH MICROSCOPY WITH CULTURE REFLEX PERFORMABLE - Abnormal; Notable for the following components:   Protein, UA Trace (*)    Ketones, UA 150 mg/dL (*)    Blood, UA Trace (*)    RBC, UA 10 (*)    WBC, UA 9 (*)    Bacteria, UA Rare (*)    Mucus, UA Rare (*)    All other components within normal limits  LIPASE - Normal  PREGNANCY, URINE - Normal  MAGNESIUM  - Normal  CBC W/ DIFFERENTIAL   Narrative:    The following orders were created for panel order CBC w/ Differential.                 Procedure                               Abnormality         Status                                    ---------                               -----------         ------  CBC w/ Differential[[id number]]         Abnormal            Final result                                               Please view results for these tests on the individual orders.  URINALYSIS WITH MICROSCOPY WITH CULTURE REFLEX   Narrative:    The following orders were created for panel order Urinalysis with Microscopy with Culture Reflex.                 Procedure                               Abnormality         Status                                    ---------                               -----------         ------                                    Urinalysis with Microsc.SABRASABRA[[id number]]  Abnormal            Final result                                                Please view results for these tests on the individual orders.   _____________________________________________________________________  Please note - This documentation was generated using dictation and/or voice recognition software, and as such, may contain spelling or other transcription errors. Any questions regarding the content of this documentation should be directed to the individual who electronically signed.   Treasa Donnice SAUNDERS, MD Resident 10/09/23 [phone]

## 2023-10-08 NOTE — ED Triage Notes (Signed)
 Pt seen at [facility] ED yesterday for constipation. After treatment pt states she has not been able to tolerate PO.

## 2023-10-08 NOTE — Telephone Encounter (Signed)
 Patient called stating that she is on a lot of medications and is feeling constipated . She would like to know what she can take. Please contact patient at [phone] with providers advice .

## 2023-10-09 NOTE — ED Notes (Signed)
 [facility] Emergency Department Attestation Note     ED Clinical Impression    Final diagnoses:  Right lower quadrant abdominal pain (Primary)  Constipation, unspecified constipation type  Nausea       ED Attending Physician Teaching Attestation    I supervised care provided by the resident. We have discussed the case, I have reviewed the note and I agree with the plan of treatment except as documented in my note.       ED Attending Note    ED Triage Vitals  Enc Vitals Group     BP 10/07/23 2009 142/116     Pulse 10/07/23 2006 67     SpO2 Pulse 10/08/23 0004 67     Resp 10/07/23 2006 18     Temp 10/07/23 2006 36.9 C (98.4 F)     Temp Source 10/07/23 2006 Oral     SpO2 10/07/23 2006 98 %     Weight 10/07/23 2006 85.7 kg (189 lb)     Height --      Head Circumference --      Peak Flow --      Pain Score --      Pain Loc --      Pain Education --      Exclude from Growth Chart --         See chart and midlevel provider documentation for details.    Additional Medical Decision Making    I have reviewed the patient's vital signs and the nursing notes. Any pertinent labs & imaging results which were available during my care of the patient were reviewed by me.      Portions of this record have been created using Scientist, clinical (histocompatibility and immunogenetics). Dictation errors have been sought, but may not have been identified and corrected.

## 2023-10-10 NOTE — ED Notes (Signed)
 [facility] Emergency Department Attestation Note     ED Clinical Impression    Final diagnoses:  Nausea and vomiting, unspecified vomiting type (Primary)      ED Attending Physician Teaching Attestation    I supervised care provided by the resident. We have discussed the case. I have reviewed the note and agree with the plan of treatment except as documented in my note.  I have personally performed a face-to-face diagnostic evaluation on this patient.   Reviewed multiple recent visits to the ER.  Previously had abdominal pain and constipation.  The abdominal pain is actually improved significantly.  Primary symptom right now is nausea and vomiting.  This could be related to constipation, gastritis, duodenitis, esophagitis, or GERD.  Abdominal exam reassuring.  Minimal concern for peritonitis or acute surgical abdomen.  Has had multiple rounds of recent abdominal imaging.  No indication to repeat.  Minimal concern for alternative intra-abdominal etiology at this time, including vascular, urinary, or new disease such as appendicitis, cholecystitis, or otherwise.  Labs here with persistent leukocytosis, though this has been noted previously.  CMP and lipase reassuring aside from mildly elevated anion gap, which I suspect is related to her GI symptoms.  Otherwise without AKI or significant electrolyte abnormalities.  Negative pregnancy test.  UA not immediately suggestive of infection.  On my interpretation, EKG with NSR. Normal intervals and axis. No ST segment elevations or depressions. No pathologic T wave inversions. No delta waves or Brugada pattern.  Minimal concern for atypical ACS presentation.  She denies being given any antiemetics at home previously.  She felt better with antiemetics and IV fluids here in the ER.  Discharged with a prescription for Zofran .  Would consider admission for further workup/or management if unable to manage at home with antiemetics, especially given  multiple recent presentations.  Portions of this record may have been created using Scientist, clinical (histocompatibility and immunogenetics). Dictation errors have been sought, but may not have been identified and corrected.  See chart and resident/APP provider documentation for details.

## 2023-12-09 NOTE — Telephone Encounter (Signed)
 Patient is requesting the following refill Requested Prescriptions   Pending Prescriptions Disp Refills  . metFORMIN  (GLUCOPHAGE ) 500 MG tablet [Pharmacy Med Name: METFORMIN  HYDROCHLORIDE 500MG  TABLET] 180 tablet 1    Sig: TAKE (1) TABLET BY MOUTH EVERY MORNING AND TAKE (1) TABLET BY MOUTH EVERY EVENING WITH MEALS    Recent Visits Date Type Provider Dept  07/04/23 Office Visit Mangel, Benison Pap, DO [facility] [address] [facility]  03/25/23 Office Visit Mangel, Benison Pap, DO [facility] [address] [facility]  02/15/23 Office Visit Mangel, Benison Pap, DO [facility] [address] [facility]  Showing recent visits within past 365 days and meeting all other requirements Future Appointments No visits were found meeting these conditions. Showing future appointments within next 365 days and meeting all other requirements    Labs: A1c:  Hemoglobin A1C (%)  Date Value  02/15/2023 6.0 (H)  10/05/2021 5.8

## 2023-12-28 ENCOUNTER — Encounter
Admission: EM | Disposition: A | Discharge status: Home / Self Care | Payer: Self-pay | Source: Home / Self Care | Attending: Emergency Medicine

## 2023-12-28 ENCOUNTER — Emergency Department

## 2023-12-28 ENCOUNTER — Emergency Department: Admitting: Anesthesiology

## 2023-12-28 ENCOUNTER — Other Ambulatory Visit: Payer: Self-pay

## 2023-12-28 ENCOUNTER — Observation Stay
Admission: EM | Admit: 2023-12-28 | Discharge: 2023-12-29 | Disposition: A | Discharge status: Home / Self Care | Attending: General Surgery | Admitting: General Surgery

## 2023-12-28 DIAGNOSIS — K8050 Calculus of bile duct without cholangitis or cholecystitis without obstruction: Principal | ICD-10-CM

## 2023-12-28 DIAGNOSIS — K8012 Calculus of gallbladder with acute and chronic cholecystitis without obstruction: Principal | ICD-10-CM | POA: Insufficient documentation

## 2023-12-28 DIAGNOSIS — Z7984 Long term (current) use of oral hypoglycemic drugs: Secondary | ICD-10-CM | POA: Diagnosis not present

## 2023-12-28 DIAGNOSIS — K8020 Calculus of gallbladder without cholecystitis without obstruction: Secondary | ICD-10-CM

## 2023-12-28 DIAGNOSIS — E119 Type 2 diabetes mellitus without complications: Secondary | ICD-10-CM | POA: Diagnosis not present

## 2023-12-28 DIAGNOSIS — K810 Acute cholecystitis: Secondary | ICD-10-CM | POA: Diagnosis not present

## 2023-12-28 DIAGNOSIS — R1011 Right upper quadrant pain: Secondary | ICD-10-CM | POA: Diagnosis present

## 2023-12-28 DIAGNOSIS — J449 Chronic obstructive pulmonary disease, unspecified: Secondary | ICD-10-CM | POA: Insufficient documentation

## 2023-12-28 DIAGNOSIS — F17210 Nicotine dependence, cigarettes, uncomplicated: Secondary | ICD-10-CM | POA: Insufficient documentation

## 2023-12-28 DIAGNOSIS — Z79899 Other long term (current) drug therapy: Secondary | ICD-10-CM | POA: Diagnosis not present

## 2023-12-28 LAB — CBC WITH DIFFERENTIAL/PLATELET
Abs Immature Granulocytes: 0.1 K/uL — ABNORMAL HIGH (ref 0.00–0.07)
Basophils Absolute: 0.1 K/uL (ref 0.0–0.1)
Basophils Relative: 0 %
Eosinophils Absolute: 0.3 K/uL (ref 0.0–0.5)
Eosinophils Relative: 2 %
HCT: 43 % (ref 36.0–46.0)
Hemoglobin: 13.9 g/dL (ref 12.0–15.0)
Immature Granulocytes: 1 %
Lymphocytes Relative: 23 %
Lymphs Abs: 4.1 K/uL — ABNORMAL HIGH (ref 0.7–4.0)
MCH: 27 pg (ref 26.0–34.0)
MCHC: 32.3 g/dL (ref 30.0–36.0)
MCV: 83.7 fL (ref 80.0–100.0)
Monocytes Absolute: 1 K/uL (ref 0.1–1.0)
Monocytes Relative: 6 %
Neutro Abs: 12.2 K/uL — ABNORMAL HIGH (ref 1.7–7.7)
Neutrophils Relative %: 68 %
Platelets: 318 K/uL (ref 150–400)
RBC: 5.14 MIL/uL — ABNORMAL HIGH (ref 3.87–5.11)
RDW: 15.8 % — ABNORMAL HIGH (ref 11.5–15.5)
WBC: 17.7 K/uL — ABNORMAL HIGH (ref 4.0–10.5)
nRBC: 0 % (ref 0.0–0.2)

## 2023-12-28 LAB — COMPREHENSIVE METABOLIC PANEL WITH GFR
ALT: 22 U/L (ref 0–44)
AST: 25 U/L (ref 15–41)
Albumin: 4.5 g/dL (ref 3.5–5.0)
Alkaline Phosphatase: 80 U/L (ref 38–126)
Anion gap: 12 (ref 5–15)
BUN: 19 mg/dL (ref 6–20)
CO2: 23 mmol/L (ref 22–32)
Calcium: 9.8 mg/dL (ref 8.9–10.3)
Chloride: 103 mmol/L (ref 98–111)
Creatinine, Ser: 0.76 mg/dL (ref 0.44–1.00)
GFR, Estimated: >60 mL/min (ref >60)
Glucose, Bld: 173 mg/dL — ABNORMAL HIGH (ref 70–99)
Potassium: 3.7 mmol/L (ref 3.5–5.1)
Sodium: 138 mmol/L (ref 135–145)
Total Bilirubin: 0.6 mg/dL (ref 0.0–1.2)
Total Protein: 8.5 g/dL — ABNORMAL HIGH (ref 6.5–8.1)

## 2023-12-28 LAB — URINALYSIS, ROUTINE W REFLEX MICROSCOPIC
Bilirubin Urine: NEGATIVE
Glucose, UA: NEGATIVE mg/dL
Ketones, ur: 20 mg/dL — AB
Leukocytes,Ua: NEGATIVE
Nitrite: NEGATIVE
Protein, ur: NEGATIVE mg/dL
Specific Gravity, Urine: 1.019 (ref 1.005–1.030)
pH: 8 (ref 5.0–8.0)

## 2023-12-28 LAB — LIPASE, BLOOD: Lipase: 46 U/L (ref 11–51)

## 2023-12-28 LAB — CBG MONITORING, ED: Glucose-Capillary: 168 mg/dL — ABNORMAL HIGH (ref 70–99)

## 2023-12-28 LAB — PREGNANCY, URINE: Preg Test, Ur: NEGATIVE

## 2023-12-28 SURGERY — CHOLECYSTECTOMY, ROBOT-ASSISTED, LAPAROSCOPIC
Anesthesia: General | Site: Abdomen

## 2023-12-28 MED ORDER — FENTANYL CITRATE (PF) 100 MCG/2ML IJ SOLN
INTRAMUSCULAR | Status: AC
  Filled 2023-12-28: qty 2

## 2023-12-28 MED ORDER — QUETIAPINE FUMARATE 25 MG PO TABS
50.0000 mg | ORAL_TABLET | Freq: Every day | ORAL | Status: DC
  Administered 2023-12-28: 50 mg via ORAL
  Filled 2023-12-28: qty 2

## 2023-12-28 MED ORDER — ONDANSETRON HCL 4 MG/2ML IJ SOLN
4.0000 mg | INTRAMUSCULAR | Status: DC | PRN
  Administered 2023-12-28 – 2023-12-29 (×4): 4 mg via INTRAVENOUS
  Filled 2023-12-28 (×4): qty 2

## 2023-12-28 MED ORDER — ROCURONIUM BROMIDE 100 MG/10ML IV SOLN
INTRAVENOUS | Status: DC | PRN
  Administered 2023-12-28: 20 mg via INTRAVENOUS
  Administered 2023-12-28: 60 mg via INTRAVENOUS

## 2023-12-28 MED ORDER — DIAZEPAM 5 MG PO TABS
5.0000 mg | ORAL_TABLET | Freq: Once | ORAL | Status: AC
  Administered 2023-12-28: 5 mg via ORAL
  Filled 2023-12-28: qty 1

## 2023-12-28 MED ORDER — INDOCYANINE GREEN 25 MG IV SOLR
INTRAVENOUS | Status: AC
  Filled 2023-12-28: qty 10

## 2023-12-28 MED ORDER — HYDROXYZINE HCL 25 MG PO TABS
25.0000 mg | ORAL_TABLET | Freq: Once | ORAL | Status: AC
  Administered 2023-12-28: 25 mg via ORAL
  Filled 2023-12-28: qty 1

## 2023-12-28 MED ORDER — LIDOCAINE VISCOUS HCL 2 % MT SOLN
15.0000 mL | Freq: Once | OROMUCOSAL | Status: AC
  Administered 2023-12-28: 15 mL via ORAL
  Filled 2023-12-28: qty 15

## 2023-12-28 MED ORDER — ACETAMINOPHEN 10 MG/ML IV SOLN
INTRAVENOUS | Status: DC | PRN
  Administered 2023-12-28: 1000 mg via INTRAVENOUS

## 2023-12-28 MED ORDER — KETOROLAC TROMETHAMINE 15 MG/ML IJ SOLN
15.0000 mg | Freq: Once | INTRAMUSCULAR | Status: AC
  Administered 2023-12-28: 15 mg via INTRAVENOUS
  Filled 2023-12-28: qty 1

## 2023-12-28 MED ORDER — SODIUM CHLORIDE 0.9 % IV SOLN
INTRAVENOUS | Status: DC | PRN
Start: 2023-12-28 — End: 2023-12-28

## 2023-12-28 MED ORDER — DEXAMETHASONE SODIUM PHOSPHATE 10 MG/ML IJ SOLN
INTRAMUSCULAR | Status: DC | PRN
  Administered 2023-12-28: 10 mg via INTRAVENOUS

## 2023-12-28 MED ORDER — PROPOFOL 10 MG/ML IV BOLUS
INTRAVENOUS | Status: DC | PRN
  Administered 2023-12-28: 150 mg via INTRAVENOUS

## 2023-12-28 MED ORDER — ONDANSETRON HCL 4 MG/2ML IJ SOLN
4.0000 mg | Freq: Once | INTRAMUSCULAR | Status: AC
  Administered 2023-12-28: 4 mg via INTRAVENOUS
  Filled 2023-12-28: qty 2

## 2023-12-28 MED ORDER — CLONAZEPAM 0.5 MG PO TABS: 0.5000 mg | ORAL_TABLET | Freq: Two times a day (BID) | ORAL | Status: DC

## 2023-12-28 MED ORDER — SODIUM CHLORIDE 0.9 % IV SOLN: 1.0000 g | Freq: Once | INTRAVENOUS | Status: DC

## 2023-12-28 MED ORDER — MIDAZOLAM HCL 2 MG/2ML IJ SOLN
INTRAMUSCULAR | Status: DC | PRN
  Administered 2023-12-28: 2 mg via INTRAVENOUS

## 2023-12-28 MED ORDER — CLONAZEPAM 0.5 MG PO TABS
0.5000 mg | ORAL_TABLET | Freq: Two times a day (BID) | ORAL | Status: DC | PRN
  Administered 2023-12-28 – 2023-12-29 (×2): 0.5 mg via ORAL
  Filled 2023-12-28 (×2): qty 1

## 2023-12-28 MED ORDER — DEXMEDETOMIDINE HCL IN NACL 80 MCG/20ML IV SOLN
INTRAVENOUS | Status: DC | PRN
Start: 2023-12-28 — End: 2023-12-28
  Administered 2023-12-28: 4 ug via INTRAVENOUS
  Administered 2023-12-28: 8 ug via INTRAVENOUS
  Administered 2023-12-28: 4 ug via INTRAVENOUS

## 2023-12-28 MED ORDER — SODIUM CHLORIDE 0.9 % IV SOLN
2.0000 g | Freq: Once | INTRAVENOUS | Status: AC
  Administered 2023-12-28: 2 g via INTRAVENOUS
  Filled 2023-12-28: qty 2

## 2023-12-28 MED ORDER — ONDANSETRON HCL 4 MG/2ML IJ SOLN
INTRAMUSCULAR | Status: DC | PRN
  Administered 2023-12-28: 4 mg via INTRAVENOUS

## 2023-12-28 MED ORDER — HYDROMORPHONE HCL 1 MG/ML IJ SOLN
INTRAMUSCULAR | Status: AC
  Filled 2023-12-28: qty 1

## 2023-12-28 MED ORDER — SODIUM CHLORIDE 0.9 % IV BOLUS
1000.0000 mL | Freq: Once | INTRAVENOUS | Status: AC
  Administered 2023-12-28: 1000 mL via INTRAVENOUS

## 2023-12-28 MED ORDER — DROPERIDOL 2.5 MG/ML IJ SOLN
0.6250 mg | Freq: Once | INTRAMUSCULAR | Status: AC | PRN
  Administered 2023-12-28: 0.625 mg via INTRAVENOUS

## 2023-12-28 MED ORDER — MORPHINE SULFATE (PF) 4 MG/ML IV SOLN
4.0000 mg | Freq: Once | INTRAVENOUS | Status: AC
  Administered 2023-12-28: 4 mg via INTRAVENOUS
  Filled 2023-12-28: qty 1

## 2023-12-28 MED ORDER — BUPIVACAINE-EPINEPHRINE (PF) 0.5% -1:200000 IJ SOLN
INTRAMUSCULAR | Status: DC | PRN
  Administered 2023-12-28: 20 mL via INTRAMUSCULAR

## 2023-12-28 MED ORDER — DROPERIDOL 2.5 MG/ML IJ SOLN
2.5000 mg | Freq: Once | INTRAMUSCULAR | Status: AC
  Administered 2023-12-28: 2.5 mg via INTRAVENOUS
  Filled 2023-12-28: qty 2

## 2023-12-28 MED ORDER — PROPRANOLOL HCL 10 MG PO TABS: 10.0000 mg | ORAL_TABLET | Freq: Three times a day (TID) | ORAL | Status: DC

## 2023-12-28 MED ORDER — HYDROMORPHONE HCL 1 MG/ML IJ SOLN
0.2500 mg | INTRAMUSCULAR | Status: DC | PRN
  Administered 2023-12-28: 0.25 mg via INTRAVENOUS
  Administered 2023-12-28 (×3): 0.5 mg via INTRAVENOUS

## 2023-12-28 MED ORDER — GABAPENTIN 300 MG PO CAPS
300.0000 mg | ORAL_CAPSULE | Freq: Three times a day (TID) | ORAL | Status: DC
  Administered 2023-12-28 – 2023-12-29 (×3): 300 mg via ORAL
  Filled 2023-12-28 (×3): qty 1

## 2023-12-28 MED ORDER — PRAZOSIN HCL 1 MG PO CAPS
5.0000 mg | ORAL_CAPSULE | Freq: Every day | ORAL | Status: DC
  Administered 2023-12-28: 5 mg via ORAL
  Filled 2023-12-28: qty 5
  Filled 2023-12-28: qty 1
  Filled 2023-12-28: qty 5

## 2023-12-28 MED ORDER — PROMETHAZINE HCL 25 MG/ML IJ SOLN
6.2500 mg | Freq: Once | INTRAMUSCULAR | Status: AC
  Administered 2023-12-28: 6.25 mg via INTRAMUSCULAR
  Filled 2023-12-28: qty 1

## 2023-12-28 MED ORDER — ACETAMINOPHEN 10 MG/ML IV SOLN
INTRAVENOUS | Status: AC
  Filled 2023-12-28: qty 100

## 2023-12-28 MED ORDER — PROPOFOL 10 MG/ML IV BOLUS
INTRAVENOUS | Status: AC
Start: 2023-12-28 — End: 2023-12-28
  Filled 2023-12-28: qty 20

## 2023-12-28 MED ORDER — ACETAMINOPHEN 500 MG PO TABS
1000.0000 mg | ORAL_TABLET | Freq: Three times a day (TID) | ORAL | Status: DC
  Administered 2023-12-28 – 2023-12-29 (×2): 1000 mg via ORAL
  Filled 2023-12-28 (×2): qty 2

## 2023-12-28 MED ORDER — DROPERIDOL 2.5 MG/ML IJ SOLN
INTRAMUSCULAR | Status: AC
  Filled 2023-12-28: qty 2

## 2023-12-28 MED ORDER — SCOPOLAMINE 1 MG/3DAYS TD PT72
1.0000 | MEDICATED_PATCH | TRANSDERMAL | Status: DC
  Administered 2023-12-28: 1.5 mg via TRANSDERMAL
  Filled 2023-12-28: qty 1

## 2023-12-28 MED ORDER — ATENOLOL 50 MG PO TABS: 50.0000 mg | ORAL_TABLET | Freq: Every day | ORAL | Status: DC

## 2023-12-28 MED ORDER — PHENYLEPHRINE 80 MCG/ML (10ML) SYRINGE FOR IV PUSH (FOR BLOOD PRESSURE SUPPORT)
PREFILLED_SYRINGE | INTRAVENOUS | Status: DC | PRN
  Administered 2023-12-28 (×4): 80 ug via INTRAVENOUS

## 2023-12-28 MED ORDER — ALUM & MAG HYDROXIDE-SIMETH 200-200-20 MG/5ML PO SUSP
30.0000 mL | Freq: Once | ORAL | Status: AC
  Administered 2023-12-28: 30 mL via ORAL
  Filled 2023-12-28: qty 30

## 2023-12-28 MED ORDER — PROPRANOLOL HCL ER 60 MG PO CP24
60.0000 mg | ORAL_CAPSULE | Freq: Every day | ORAL | Status: DC
  Administered 2023-12-28: 60 mg via ORAL
  Filled 2023-12-28 (×2): qty 1

## 2023-12-28 MED ORDER — MIRTAZAPINE 15 MG PO TABS
45.0000 mg | ORAL_TABLET | Freq: Every day | ORAL | Status: DC
  Administered 2023-12-28: 45 mg via ORAL
  Filled 2023-12-28: qty 1

## 2023-12-28 MED ORDER — OXYCODONE HCL 5 MG PO TABS: 5.0000 mg | ORAL_TABLET | ORAL | Status: DC | PRN

## 2023-12-28 MED ORDER — PANTOPRAZOLE SODIUM 40 MG IV SOLR
40.0000 mg | Freq: Once | INTRAVENOUS | Status: AC
  Administered 2023-12-28: 40 mg via INTRAVENOUS
  Filled 2023-12-28: qty 10

## 2023-12-28 MED ORDER — LACTATED RINGERS IV SOLN: INTRAVENOUS | Status: AC

## 2023-12-28 MED ORDER — MORPHINE SULFATE (PF) 2 MG/ML IV SOLN
2.0000 mg | INTRAVENOUS | Status: DC | PRN
  Administered 2023-12-28 – 2023-12-29 (×3): 2 mg via INTRAVENOUS
  Filled 2023-12-28 (×3): qty 1

## 2023-12-28 MED ORDER — ACETAMINOPHEN 10 MG/ML IV SOLN: 1000.0000 mg | Freq: Once | INTRAVENOUS | Status: DC | PRN

## 2023-12-28 MED ORDER — HYDROMORPHONE HCL 1 MG/ML IJ SOLN
INTRAMUSCULAR | Status: AC
Start: 2023-12-28 — End: 2023-12-28
  Filled 2023-12-28: qty 1

## 2023-12-28 MED ORDER — 0.9 % SODIUM CHLORIDE (POUR BTL) OPTIME
TOPICAL | Status: DC | PRN
Start: 2023-12-28 — End: 2023-12-28
  Administered 2023-12-28: 1000 mL

## 2023-12-28 MED ORDER — ALBUTEROL SULFATE (2.5 MG/3ML) 0.083% IN NEBU: 3.0000 mL | INHALATION_SOLUTION | RESPIRATORY_TRACT | Status: DC | PRN

## 2023-12-28 MED ORDER — FENTANYL CITRATE (PF) 100 MCG/2ML IJ SOLN
INTRAMUSCULAR | Status: DC | PRN
  Administered 2023-12-28: 50 ug via INTRAVENOUS
  Administered 2023-12-28 (×2): 25 ug via INTRAVENOUS
  Administered 2023-12-28 (×2): 50 ug via INTRAVENOUS

## 2023-12-28 MED ORDER — SUGAMMADEX SODIUM 200 MG/2ML IV SOLN
INTRAVENOUS | Status: DC | PRN
  Administered 2023-12-28: 200 mg via INTRAVENOUS

## 2023-12-28 MED ORDER — SODIUM CHLORIDE 0.9 % IV SOLN
12.5000 mg | Freq: Once | INTRAVENOUS | Status: AC
  Administered 2023-12-28: 12.5 mg via INTRAVENOUS
  Filled 2023-12-28: qty 12.5

## 2023-12-28 MED ORDER — INDOCYANINE GREEN 25 MG IV SOLR
1.2500 mg | Freq: Once | INTRAVENOUS | Status: AC
  Administered 2023-12-28: 1.25 mg via TOPICAL
  Filled 2023-12-28: qty 10

## 2023-12-28 MED ORDER — MIDAZOLAM HCL 2 MG/2ML IJ SOLN
INTRAMUSCULAR | Status: AC
  Filled 2023-12-28: qty 2

## 2023-12-28 MED ORDER — LIDOCAINE HCL (CARDIAC) PF 100 MG/5ML IV SOSY
PREFILLED_SYRINGE | INTRAVENOUS | Status: DC | PRN
  Administered 2023-12-28: 100 mg via INTRAVENOUS

## 2023-12-28 SURGICAL SUPPLY — 37 items
BAG PRESSURE INF REUSE 1000 (BAG) IMPLANT
CANNULA REDUCER 12-8 DVNC XI (CANNULA) ×1 IMPLANT
CAUTERY HOOK MNPLR 1.6 DVNC XI (INSTRUMENTS) ×1 IMPLANT
CLIP LIGATING HEMO O LOK GREEN (MISCELLANEOUS) ×1 IMPLANT
DEFOGGER SCOPE WARM SEASHARP (MISCELLANEOUS) ×1 IMPLANT
DERMABOND ADVANCED .7 DNX12 (GAUZE/BANDAGES/DRESSINGS) ×1 IMPLANT
DRAPE ARM DVNC X/XI (DISPOSABLE) ×4 IMPLANT
DRAPE COLUMN DVNC XI (DISPOSABLE) ×1 IMPLANT
ELECTRODE REM PT RTRN 9FT ADLT (ELECTROSURGICAL) ×1 IMPLANT
FORCEPS BPLR 8 MD DVNC XI (FORCEP) IMPLANT
FORCEPS BPLR R/ABLATION 8 DVNC (INSTRUMENTS) ×1 IMPLANT
FORCEPS PROGRASP DVNC XI (FORCEP) ×1 IMPLANT
GLOVE BIOGEL PI IND STRL 7.5 (GLOVE) ×2 IMPLANT
GLOVE SURG SYN 7.0 PF PI (GLOVE) ×2 IMPLANT
GOWN STRL REUS W/ TWL LRG LVL3 (GOWN DISPOSABLE) ×3 IMPLANT
GRASPER SUT TROCAR 14GX15 (MISCELLANEOUS) ×1 IMPLANT
IRRIGATOR SUCT 8 DISP DVNC XI (IRRIGATION / IRRIGATOR) IMPLANT
IV NS 1000ML BAXH (IV SOLUTION) IMPLANT
KIT PINK PAD W/HEAD ARM REST (MISCELLANEOUS) ×1 IMPLANT
LABEL OR SOLS (LABEL) ×1 IMPLANT
MANIFOLD NEPTUNE II (INSTRUMENTS) ×1 IMPLANT
NDL HYPO 22X1.5 SAFETY MO (MISCELLANEOUS) ×1 IMPLANT
NDL INSUFFLATION 14GA 120MM (NEEDLE) ×1 IMPLANT
NEEDLE HYPO 22X1.5 SAFETY MO (MISCELLANEOUS) ×1 IMPLANT
NEEDLE INSUFFLATION 14GA 120MM (NEEDLE) ×1 IMPLANT
NS IRRIG 500ML POUR BTL (IV SOLUTION) ×1 IMPLANT
OBTURATOR OPTICALSTD 8 DVNC (TROCAR) ×1 IMPLANT
PACK LAP CHOLECYSTECTOMY (MISCELLANEOUS) ×1 IMPLANT
SEAL UNIV 5-12 XI (MISCELLANEOUS) ×4 IMPLANT
SET TUBE SMOKE EVAC HIGH FLOW (TUBING) ×1 IMPLANT
SOLUTION ELECTROSURG ANTI STCK (MISCELLANEOUS) ×1 IMPLANT
SPIKE FLUID TRANSFER (MISCELLANEOUS) ×2 IMPLANT
SPONGE T-LAP 4X18 ~~LOC~~+RFID (SPONGE) IMPLANT
SUT VICRYL 0 UR6 27IN ABS (SUTURE) ×1 IMPLANT
SUTURE MNCRL 4-0 27XMF (SUTURE) ×1 IMPLANT
SYSTEM BAG RETRIEVAL 10MM (BASKET) ×1 IMPLANT
WATER STERILE IRR 500ML POUR (IV SOLUTION) ×1 IMPLANT

## 2023-12-28 NOTE — OR Nursing (Signed)
POC urine preg negative 

## 2023-12-28 NOTE — H&P (Addendum)
 Patient ID: Natalie Rose, female   DOB: [birth year], 43 y.o.   MRN: [id number] CC: Biliary Colic History of Present Illness Natalie Rose is a 43 y.o. female with past medical history significant for COPD and depression who presents in consultation for epigastric abdominal pain and nausea.  The patient reports that yesterday she started to have epigastric and right upper quadrant pain associated with emesis.  At first she thought she had food poisoning but the pain persisted so she came to the ED for evaluation.  She reports that she does not routinely get postprandial right upper quadrant pain but she did have this pain several years ago similar to this.  She denies any fevers but does report that she is felt warm and cold on and off.  She denies any diarrhea..  Past Medical History Past Medical History:  Diagnosis Date   Anxiety    Arthritis    COPD (chronic obstructive pulmonary disease) (HCC)    Depression    Diabetes mellitus without complication (HCC)    Mental health disorder    Migraines        Past Surgical History:  Procedure Laterality Date   MOUTH SURGERY      Allergies  Allergen Reactions   Diazepam  Hives, Anxiety, Itching, Other (See Comments) and Rash    unknown unknown    Lurasidone     No current facility-administered medications for this encounter.   Current Outpatient Medications  Medication Sig Dispense Refill   albuterol  (VENTOLIN  HFA) 108 (90 Base) MCG/ACT inhaler Inhale 2 puffs into the lungs every 4 (four) hours as needed. 18 g 0   atenolol  (TENORMIN ) 50 MG tablet Take 1 tablet (50 mg total) by mouth daily. 30 tablet 1   clonazePAM  (KLONOPIN ) 0.5 MG tablet Take 1 tablet (0.5 mg total) by mouth 2 (two) times daily. 60 tablet 1   gabapentin  (NEURONTIN ) 300 MG capsule Take 1 capsule (300 mg total) by mouth 3 (three) times daily. 90 capsule 1   LYBALVI  10-10 MG TABS Take 1 tablet by mouth daily.     metFORMIN  (GLUCOPHAGE ) 500 MG  tablet Take 1 tablet (500 mg total) by mouth 2 (two) times daily with a meal. 60 tablet 1   mirtazapine  (REMERON ) 45 MG tablet Take 1 tablet (45 mg total) by mouth at bedtime. 30 tablet 1   ondansetron  (ZOFRAN ) 4 MG tablet Take 1 tablet (4 mg total) by mouth daily as needed for nausea or vomiting. 30 tablet 1   prazosin  (MINIPRESS ) 2 MG capsule Take 2 capsules (4 mg total) by mouth at bedtime. 60 capsule 1   prazosin  (MINIPRESS ) 5 MG capsule Take 5 mg by mouth at bedtime.     propranolol  (INDERAL ) 10 MG tablet Take 1 tablet (10 mg total) by mouth 3 (three) times daily. 90 tablet 1   QUEtiapine  (SEROQUEL ) 50 MG tablet Take 1 tablet (50 mg total) by mouth at bedtime. 30 tablet 0   sertraline  (ZOLOFT ) 50 MG tablet Take 3 tablets (150 mg total) by mouth daily. 90 tablet 1   sucralfate  (CARAFATE ) 1 g tablet Take 1 g by mouth 4 (four) times daily.     baclofen  (LIORESAL ) 10 MG tablet Take 1 tablet (10 mg total) by mouth 3 (three) times daily. (Patient not taking: Reported on 12/28/2023) 30 each 0   cyclobenzaprine  (FLEXERIL ) 10 MG tablet Take 1 tablet (10 mg total) by mouth at bedtime. (Patient not taking: Reported on 12/28/2023) 20 tablet 0   hydrOXYzine  (  ATARAX ) 50 MG tablet Take by mouth. (Patient not taking: Reported on 12/28/2023)     promethazine -dextromethorphan (PROMETHAZINE -DM) 6.25-15 MG/5ML syrup Take 5 mLs by mouth 4 (four) times daily as needed. (Patient not taking: Reported on 12/28/2023) 118 mL 0   Spacer/Aero-Holding Chambers (AEROCHAMBER MV) inhaler Use as instructed 1 each 2   traZODone  (DESYREL ) 100 MG tablet Take 1 tablet (100 mg total) by mouth at bedtime. (Patient not taking: Reported on 12/28/2023) 30 tablet 1    Family History Family History  Problem Relation Age of Onset   Osteopenia Mother    Heart failure Mother    Osteoarthritis Mother    Depression Mother    Diabetes Father        Social History Social History   Tobacco Use   Smoking status: Every Day    Current  packs/day: 1.00    Average packs/day: 1 pack/day for 21.6 years (21.6 ttl pk-yrs)    Types: Cigarettes    Start date: 2004   Smokeless tobacco: Never  Vaping Use   Vaping status: Never Used  Substance Use Topics   Alcohol use: Not Currently   Drug use: Yes    Frequency: 3.0 times per week    Types: Marijuana        ROS Full ROS of systems performed and is otherwise negative there than what is stated in the HPI  Physical Exam Blood pressure (!) 149/95, pulse 93, temperature 98 F (36.7 C), resp. rate 18, height 4' 11 (1.499 m), weight 81.6 kg, last menstrual period 12/28/2023, SpO2 98%.  Alert and oriented x 3, normal work of breathing on room air, regular rate and rhythm, abdomen is soft, obese, tender to palpation in the epigastrium and the right upper quadrant.  No Murphy sign appreciated, some tenderness in the right lower quadrant as well.  No surgical scars.  Moving all extremities spontaneously. Data Reviewed Labs reviewed and she has normal kidney function.  Her total bilirubin is within normal limits.  She does have a persistent leukocytosis that seems to be her baseline although it is a little bit elevated from previous lab draws. Ultrasound independently reviewed and there is stones within the gallbladder lumen. I have personally reviewed the patient's imaging and medical records.    Assessment/Plan    43 year old female with 1 day history of epigastric and right upper quadrant pain associated with nausea.  Ultrasound shows that she has gallstones.  Discussed with her that this is likely biliary colic.  She would like to proceed with surgery.  I discussed risk, benefits alternatives of surgery including risk of infection, bleeding, bile leak, injury to common bile duct, retained stone and need for open procedure.  She understands these risks and wishes to proceed with surgery.  Will place her on fluids and order cefoxitin  for preoperative antibiotics.  I will also use ICG  intraoperatively   A total of 55 minutes was spent reviewing the patient's chart, performing a history and physical and discussing treatment options with the patient  Jayson MALVA Endow 12/28/2023, 7:36 AM

## 2023-12-28 NOTE — Op Note (Signed)
 Robotic assisted laparoscopic Cholecystectomy  Pre-operative Diagnosis: Biliary Colic  Post-operative Diagnosis: Acute CHolecystitsi  Procedure:  Robotic assisted laparoscopic Cholecystectomy  Surgeon: Jayson Endow, MD  Anesthesia: Gen. with endotracheal tube  Findings: Inflamed gallbladder, critical view of safety obtained  Estimated Blood Loss: 10cc       Specimens: Gallbladder           Complications: none   Procedure Details  The patient was seen again in the Holding Room. The benefits, complications, treatment options, and expected outcomes were discussed with the patient. The risks of bleeding, infection, recurrence of symptoms, failure to resolve symptoms, bile duct damage, bile duct leak, retained common bile duct stone, bowel injury, any of which could require further surgery and/or ERCP, stent, or papillotomy were reviewed with the patient. The likelihood of improving the patient's symptoms with return to their baseline status is good.  The patient and/or family concurred with the proposed plan, giving informed consent.  The patient was taken to Operating Room, identified  and the procedure verified as robotic Cholecystectomy.  A Time Out was held and the above information confirmed.  Prior to the induction of general anesthesia, antibiotic prophylaxis was administered. VTE prophylaxis was in place. General endotracheal anesthesia was then administered and tolerated well. After the induction, the abdomen was prepped with Chloraprep and draped in the sterile fashion. The patient was positioned in the supine position.  A veress needle was inserted into the abdomen using standard drop technique. An 8mm infra-umbilical robotic port was then placed under direct visualization. There was no injury noted at the site of veress needle insertion. Two right sided abdominal 8mm ports followed by an 8mm left abdominal robotic ports were placed under direct visualization. The left sided  abdominal port was then upsized to a 12mm robotic port.  The patient was positioned  in reverse Trendelenburg, robot was brought to the surgical field and docked in the standard fashion.  We made sure all the instrumentation was kept indirect view at all times and that there were no collision between the arms. I scrubbed out and went to the console.  The gallbladder was identified, the fundus grasped and retracted cephalad. Adhesions were lysed bluntly. The infundibulum was grasped and retracted laterally, exposing the peritoneum overlying the triangle of Calot. This was then divided and exposed in a blunt fashion. An extended critical view of the cystic duct and cystic artery was obtained.  The cystic duct was clearly identified and bluntly dissected.   Artery and duct were double clipped and divided. Using ICG cholangiography we visualized the cystic duct. The gallbladder was taken from the gallbladder fossa in a retrograde fashion with the electrocautery.  Hemostasis was achieved with the electrocautery. nspection of the right upper quadrant was performed. No bleeding, bile duct injury or leak, or bowel injury was noted. Robotic instruments and robotic arms were undocked in the standard fashion.  I scrubbed back in.  The gallbladder was removed and placed in an Endocatch bag.   The left lower quadrant fascia was then closed with a 0 vicryl using a suture needle passer. The pre-peritoneal space was then infiltrated with liposomal bupivicaine and marcaine  solution. Pneumoperitoneum was released.  4-0 subcuticular Monocryl was used to close the skin. Dermabond was  applied.  The patient was then extubated and brought to the recovery room in stable condition. Sponge, lap, and needle counts were correct at closure and at the conclusion of the case.  Jayson Endow, M.D. [facility]

## 2023-12-28 NOTE — ED Notes (Signed)
 This RN messaged Jayson Endow, MD regarding pt request for pain medicine. Pt complaining of epigastric; rates pain 9/10. Received 4mg  IV morphine  at 0745, however pt states it didn't help.

## 2023-12-28 NOTE — Anesthesia Preprocedure Evaluation (Addendum)
 Anesthesia Evaluation  Patient identified by MRN, date of birth, ID band Patient awake    Reviewed: Allergy & Precautions, H&P , NPO status , Patient's Chart, lab work & pertinent test results  Airway Mallampati: II  TM Distance: >3 FB Neck ROM: full    Dental no notable dental hx.    Pulmonary COPD, Current Smoker   Pulmonary exam normal        Cardiovascular negative cardio ROS Normal cardiovascular exam     Neuro/Psych  PSYCHIATRIC DISORDERS Anxiety Depression    OCD (obsessive compulsive disorder) PTSD (post-traumatic stress disorder)  negative neurological ROS     GI/Hepatic negative GI ROS, Neg liver ROS,,,  Endo/Other  diabetes, Type 2, Oral Hypoglycemic Agents    Renal/GU      Musculoskeletal   Abdominal  (+) + obese  Peds  Hematology negative hematology ROS (+)   Anesthesia Other Findings Cholecystitis  Past Medical History: No date: Anxiety No date: Arthritis No date: COPD (chronic obstructive pulmonary disease) (HCC) No date: Depression No date: Diabetes mellitus without complication (HCC) No date: Mental health disorder No date: Migraines  Past Surgical History: No date: MOUTH SURGERY  BMI    Body Mass Index: 36.36 kg/m      Reproductive/Obstetrics negative OB ROS                              Anesthesia Physical Anesthesia Plan  ASA: 2  Anesthesia Plan: General ETT   Post-op Pain Management: Toradol  IV (intra-op)* and Ofirmev  IV (intra-op)*   Induction: Intravenous and Rapid sequence  PONV Risk Score and Plan: 2 and Ondansetron , Dexamethasone , Midazolam  and Propofol  infusion  Airway Management Planned: Oral ETT  Additional Equipment:   Intra-op Plan:   Post-operative Plan: Extubation in OR  Informed Consent: I have reviewed the patients History and Physical, chart, labs and discussed the procedure including the risks, benefits and alternatives  for the proposed anesthesia with the patient or authorized representative who has indicated his/her understanding and acceptance.     Dental Advisory Given  Plan Discussed with: CRNA and Surgeon  Anesthesia Plan Comments:          Anesthesia Quick Evaluation

## 2023-12-28 NOTE — Anesthesia Postprocedure Evaluation (Signed)
 Anesthesia Post Note  Patient: Natalie Rose  Procedure(s) Performed: CHOLECYSTECTOMY, ROBOT-ASSISTED, LAPAROSCOPIC (Abdomen)  Patient location during evaluation: PACU Anesthesia Type: General Level of consciousness: awake and alert Pain management: pain level controlled Vital Signs Assessment: post-procedure vital signs reviewed and stable Respiratory status: spontaneous breathing, nonlabored ventilation and respiratory function stable Cardiovascular status: blood pressure returned to baseline and stable Postop Assessment: no apparent nausea or vomiting Anesthetic complications: no   No notable events documented.   Last Vitals:  Vitals:   12/28/23 1415 12/28/23 1439  BP: (!) 142/92 (!) 139/124  Pulse: 90 85  Resp: (!) 24 18  Temp:  36.5 C  SpO2: 98% 97%    Last Pain:  Vitals:   12/28/23 1458  PainSc: 10-Worst pain ever                 Camellia Merilee Louder

## 2023-12-28 NOTE — Transfer of Care (Signed)
 Immediate Anesthesia Transfer of Care Note  Patient: Natalie Rose  Procedure(s) Performed: CHOLECYSTECTOMY, ROBOT-ASSISTED, LAPAROSCOPIC (Abdomen)  Patient Location: PACU  Anesthesia Type:General  Level of Consciousness: drowsy  Airway & Oxygen Therapy: Patient Spontanous Breathing and Patient connected to face mask oxygen  Post-op Assessment: Report given to RN and Post -op Vital signs reviewed and stable  Post vital signs: Reviewed and stable  Last Vitals:  Vitals Value Taken Time  BP 102/64 12/28/23 12:31  Temp    Pulse 84 12/28/23 12:33  Resp 36 12/28/23 12:33  SpO2 99 % 12/28/23 12:33  Vitals shown include unfiled device data.  Last Pain:  Vitals:   12/28/23 0843  PainSc: 9       Patients Stated Pain Goal: 0 (12/28/23 0119)  Complications: No notable events documented.

## 2023-12-28 NOTE — ED Notes (Signed)
 Patient to waiting room via wheelchair by EMS from home.  Per EMS patient thinks she might have food poisoning after eating approximately 1 hour ago.  EMS also reports that patient having anxiety attack.  EMS interventions - iv site left antecub via 20g angio cath, CBG 150, hr 77, bp 141/114, pulse oxi 98% on room air.

## 2023-12-28 NOTE — Anesthesia Procedure Notes (Signed)
 Procedure Name: Intubation Date/Time: 12/28/2023 10:47 AM  Performed by: Belinda, Kaitlynn Tramontana, CRNAPre-anesthesia Checklist: Patient identified, Emergency Drugs available, Suction available and Patient being monitored Patient Re-evaluated:Patient Re-evaluated prior to induction Oxygen Delivery Method: Circle system utilized Preoxygenation: Pre-oxygenation with 100% oxygen Induction Type: IV induction and Rapid sequence Laryngoscope Size: McGrath and 3 Grade View: Grade I Tube type: Oral Tube size: 6.5 mm Number of attempts: 1 Airway Equipment and Method: Stylet and Oral airway Placement Confirmation: ETT inserted through vocal cords under direct vision, positive ETCO2 and breath sounds checked- equal and bilateral Secured at: 20 cm Tube secured with: Tape Dental Injury: Teeth and Oropharynx as per pre-operative assessment

## 2023-12-28 NOTE — ED Triage Notes (Addendum)
 Arrives [facility] from home with vomiting ~2 hours after eating dinner.   Paramedics admin 4mg  Zofran  enroute

## 2023-12-28 NOTE — ED Provider Notes (Signed)
 [facility] Provider Note    Event Date/Time   First MD Initiated Contact with Patient 12/28/23 [phone]     (approximate)   History   Vomiting   HPI  Natalie Rose is a 43 y.o. female   Past medical history of COPD, depression, diabetes, migraines who presents with acute onset epigastric/right upper quadrant pain after eating a meal last night, and has vomited multiple times.  No GI bleeding.  No urinary symptoms.  No history of surgeries.  External Medical Documents Reviewed: Outside hospital notes from May where she had nausea and vomiting with recent CT scan at that time showing no acute abnormality      Physical Exam   Triage Vital Signs: ED Triage Vitals [12/28/23 0119]  Encounter Vitals Group     BP (!) 132/121     Girls Systolic BP Percentile      Girls Diastolic BP Percentile      Boys Systolic BP Percentile      Boys Diastolic BP Percentile      Pulse Rate 91     Resp 20     Temp 97.6 F (36.4 C)     Temp src      SpO2 100 %     Weight 180 lb (81.6 kg)     Height 4' 11 (1.499 m)     Head Circumference      Peak Flow      Pain Score 8     Pain Loc      Pain Education      Exclude from Growth Chart     Most recent vital signs: Vitals:   12/28/23 0500 12/28/23 0559  BP: (!) 149/95   Pulse: 93   Resp: 18   Temp:  98 F (36.7 C)  SpO2: 98%     General: Awake, no distress.  CV:  Good peripheral perfusion.  Resp:  Normal effort.  Abd:  No distention.  Other:  Awake alert uncomfortable appearing with right upper quadrant tenderness and epigastric tenderness to palpation, remainder of the abdomen is soft and nonfocal.  No rigidity or guarding.  Hypertensive otherwise vital signs normal afebrile.   ED Results / Procedures / Treatments   Labs (all labs ordered are listed, but only abnormal results are displayed) Labs Reviewed  COMPREHENSIVE METABOLIC PANEL WITH GFR - Abnormal; Notable for the following  components:      Result Value   Glucose, Bld 173 (*)    Total Protein 8.5 (*)    All other components within normal limits  CBC WITH DIFFERENTIAL/PLATELET - Abnormal; Notable for the following components:   WBC 17.7 (*)    RBC 5.14 (*)    RDW 15.8 (*)    Neutro Abs 12.2 (*)    Lymphs Abs 4.1 (*)    Abs Immature Granulocytes 0.10 (*)    All other components within normal limits  LIPASE, BLOOD  URINALYSIS, ROUTINE W REFLEX MICROSCOPIC  PREGNANCY, URINE     I ordered and reviewed the above labs they are notable for cell counts electrolytes unremarkable, LFTs unremarkable and chronic leukocytosis  EKG  ED ECG REPORT I, Ginnie Shams, the attending physician, personally viewed and interpreted this ECG.   Date: 12/28/2023  EKG Time: 0202  Rate: 80  Rhythm: sinus  Axis: nl  Intervals:nl  ST&T Change: no stemi    RADIOLOGY I independently reviewed and interpreted ultrasound of the abdomen see gallbladder stones I also reviewed radiologist's formal  read.   PROCEDURES:  Critical Care performed: No  Procedures   MEDICATIONS ORDERED IN ED: Medications  sodium chloride  0.9 % bolus 1,000 mL (1,000 mLs Intravenous New Bag/Given 12/28/23 0241)  ondansetron  (ZOFRAN ) injection 4 mg (4 mg Intravenous Given 12/28/23 0234)  ketorolac  (TORADOL ) 15 MG/ML injection 15 mg (15 mg Intravenous Given 12/28/23 0234)  pantoprazole  (PROTONIX ) injection 40 mg (40 mg Intravenous Given 12/28/23 0234)  alum & mag hydroxide-simeth (MAALOX/MYLANTA) 200-200-20 MG/5ML suspension 30 mL (30 mLs Oral Given 12/28/23 0310)    And  lidocaine  (XYLOCAINE ) 2 % viscous mouth solution 15 mL (15 mLs Oral Given 12/28/23 0310)  morphine  (PF) 4 MG/ML injection 4 mg (4 mg Intravenous Given 12/28/23 0307)  hydrOXYzine  (ATARAX ) tablet 25 mg (25 mg Oral Given 12/28/23 0306)  droperidol  (INAPSINE ) 2.5 MG/ML injection 2.5 mg (2.5 mg Intravenous Given 12/28/23 0427)  ondansetron  (ZOFRAN ) injection 4 mg (4 mg Intravenous Given  12/28/23 0606)    External physician / consultants:  I spoke with Dr. Sheppard Endow regarding care plan for this patient.   IMPRESSION / MDM / ASSESSMENT AND PLAN / ED COURSE  I reviewed the triage vital signs and the nursing notes.                                Patient's presentation is most consistent with acute presentation with potential threat to life or bodily function.  Differential diagnosis includes, but is not limited to, biliary colic, cholecystitis, choledocholithiasis, gastritis/GERD/ulcer, kidney stone   The patient is on the cardiac monitor to evaluate for evidence of arrhythmia and/or significant heart rate changes.  MDM:    Right upper quadrant tenderness with gallstones but no signs of cholecystitis and normal labs aside from chronic leukocytosis, pain refractory and nausea refractory to initial treatment.  Talk with Dr. Sheppard Endow of surgery for admission.  Patient admitted, defer antibiotics as given no signs of acute infection at this time       FINAL CLINICAL IMPRESSION(S) / ED DIAGNOSES   Final diagnoses:  Biliary colic  Calculus of gallbladder without cholecystitis without obstruction     Rx / DC Orders   ED Discharge Orders     None        Note:  This document was prepared using Dragon voice recognition software and may include unintentional dictation errors.    Cyrena Mylar, MD 12/28/23 [phone]

## 2023-12-29 DIAGNOSIS — K8012 Calculus of gallbladder with acute and chronic cholecystitis without obstruction: Secondary | ICD-10-CM | POA: Diagnosis not present

## 2023-12-29 MED ORDER — ONDANSETRON HCL 4 MG PO TABS: 4.0000 mg | ORAL_TABLET | Freq: Three times a day (TID) | ORAL | 0 refills | Status: DC | PRN

## 2023-12-29 MED ORDER — ONDANSETRON HCL 4 MG PO TABS
4.0000 mg | ORAL_TABLET | Freq: Three times a day (TID) | ORAL | 0 refills | Status: DC | PRN
Start: 2023-12-29 — End: 2024-01-11

## 2023-12-29 MED ORDER — ACETAMINOPHEN 500 MG PO TABS: 1000.0000 mg | ORAL_TABLET | Freq: Three times a day (TID) | ORAL | 0 refills | Status: DC

## 2023-12-29 MED ORDER — OXYCODONE HCL 5 MG PO TABS: 5.0000 mg | ORAL_TABLET | ORAL | 0 refills | Status: DC | PRN

## 2023-12-29 NOTE — Plan of Care (Signed)
  Problem: Education: Goal: Knowledge of General Education information will improve Description: Including pain rating scale, medication(s)/side effects and non-pharmacologic comfort measures Outcome: Progressing   Problem: Activity: Goal: Risk for activity intolerance will decrease Outcome: Progressing   Problem: Nutrition: Goal: Adequate nutrition will be maintained Outcome: Not Progressing   Problem: Coping: Goal: Level of anxiety will decrease Outcome: Not Progressing

## 2023-12-29 NOTE — Discharge Summary (Signed)
 Patient ID: Aissatou Fronczak MRN: [id number] DOB/AGE: May 29, 1981 43 y.o.  Admit date: 12/28/2023 Discharge date: 12/29/2023   Discharge Diagnoses:  Principal Problem:   Acute cholecystitis   Procedures:robotic assisted choleycstectomy  Hospital Course:   admitted with findings consistent with acute cholecysitis and  was taken promptly to the operating room for an uneventful laparoscopic robotic cholecystectomy.  Patient was kept overnight.  The time of discharge the patient was ambulating,  pain was controlled.  Her vital signs were stable and she was afebrile.   physical exam at discharge showed a pt  in no acute distress.  Awake and alert.  Abdomen: Soft incisions healing well without infection or peritonitis.  Extremities well-perfused and no edema.  Condition of the patient the time of discharge was stable   Consults: None  Disposition: Discharge disposition: 01-Home or Self Care       Discharge Instructions     Call MD for:  difficulty breathing, headache or visual disturbances   Complete by: As directed    Call MD for:  extreme fatigue   Complete by: As directed    Call MD for:  hives   Complete by: As directed    Call MD for:  persistant dizziness or light-headedness   Complete by: As directed    Call MD for:  persistant nausea and vomiting   Complete by: As directed    Call MD for:  redness, tenderness, or signs of infection (pain, swelling, redness, odor or green/yellow discharge around incision site)   Complete by: As directed    Call MD for:  severe uncontrolled pain   Complete by: As directed    Call MD for:  temperature >100.4   Complete by: As directed    Diet - low sodium heart healthy   Complete by: As directed    Discharge instructions   Complete by: As directed    You have surgical glue over your incisions.  This will peel off over the next 3 to 4 weeks.  You may shower when you get home.  Please at the warm water run over your incisions and  pat dry.  Please do not submerge the wounds in water such as hot tubs, baths or pools for 2 weeks.  There is some bruising to your abdomen.  You may use ice packs over the bruising.  Please refrain from heavy lifting greater than 10 to 15 pounds for 4 weeks.  You may take Tylenol  and ibuprofen  as needed for pain.  You should follow-up in our office in 2 weeks.   Increase activity slowly   Complete by: As directed       Allergies as of 12/29/2023       Reactions   Diazepam  Hives, Anxiety, Itching, Other (See Comments), Rash   unknown unknown   Lurasidone         Medication List     TAKE these medications    acetaminophen  500 MG tablet Commonly known as: TYLENOL  Take 2 tablets (1,000 mg total) by mouth every 8 (eight) hours.   AeroChamber MV inhaler Use as instructed   albuterol  108 (90 Base) MCG/ACT inhaler Commonly known as: VENTOLIN  HFA Inhale 2 puffs into the lungs every 4 (four) hours as needed.   atenolol  50 MG tablet Commonly known as: TENORMIN  Take 1 tablet (50 mg total) by mouth daily.   clonazePAM  0.5 MG tablet Commonly known as: KLONOPIN  Take 1 tablet (0.5 mg total) by mouth 2 (two) times daily. What changed:  when to take this reasons to take this   gabapentin  300 MG capsule Commonly known as: NEURONTIN  Take 1 capsule (300 mg total) by mouth 3 (three) times daily.   Lybalvi  10-10 MG Tabs Generic drug: OLANZapine -Samidorphan Take 1 tablet by mouth daily.   metFORMIN  500 MG tablet Commonly known as: GLUCOPHAGE  Take 1 tablet (500 mg total) by mouth 2 (two) times daily with a meal.   mirtazapine  45 MG tablet Commonly known as: REMERON  Take 1 tablet (45 mg total) by mouth at bedtime.   ondansetron  4 MG tablet Commonly known as: ZOFRAN  Take 1 tablet (4 mg total) by mouth every 8 (eight) hours as needed for nausea or vomiting. What changed: when to take this   oxyCODONE  5 MG immediate release tablet Commonly known as: Oxy IR/ROXICODONE  Take 1  tablet (5 mg total) by mouth every 4 (four) hours as needed for moderate pain (pain score 4-6).   prazosin  2 MG capsule Commonly known as: MINIPRESS  Take 2 capsules (4 mg total) by mouth at bedtime.   prazosin  5 MG capsule Commonly known as: MINIPRESS  Take 5 mg by mouth at bedtime.   propranolol  ER 60 MG 24 hr capsule Commonly known as: INDERAL  LA Take 60 mg by mouth at bedtime.   QUEtiapine  50 MG tablet Commonly known as: SEROQUEL  Take 1 tablet (50 mg total) by mouth at bedtime.   sertraline  50 MG tablet Commonly known as: ZOLOFT  Take 3 tablets (150 mg total) by mouth daily.   sucralfate  1 g tablet Commonly known as: CARAFATE  Take 1 g by mouth 4 (four) times daily.          Jayson Endow, M.D.

## 2023-12-30 LAB — GLUCOSE, CAPILLARY: Glucose-Capillary: 171 mg/dL — ABNORMAL HIGH (ref 70–99)

## 2023-12-31 LAB — SURGICAL PATHOLOGY

## 2024-01-07 ENCOUNTER — Ambulatory Visit (INDEPENDENT_AMBULATORY_CARE_PROVIDER_SITE_OTHER): Admitting: General Surgery

## 2024-01-07 ENCOUNTER — Encounter: Payer: Self-pay | Admitting: General Surgery

## 2024-01-07 ENCOUNTER — Encounter: Admitting: General Surgery

## 2024-01-07 VITALS — BP 107/73 | HR 114 | Temp 98.7°F | Ht 59.0 in | Wt 180.2 lb

## 2024-01-07 DIAGNOSIS — Z09 Encounter for follow-up examination after completed treatment for conditions other than malignant neoplasm: Secondary | ICD-10-CM

## 2024-01-07 DIAGNOSIS — K810 Acute cholecystitis: Secondary | ICD-10-CM

## 2024-01-07 NOTE — Patient Instructions (Signed)

## 2024-01-08 ENCOUNTER — Other Ambulatory Visit: Payer: Self-pay

## 2024-01-08 ENCOUNTER — Inpatient Hospital Stay
Admission: EM | Admit: 2024-01-08 | Discharge: 2024-01-11 | DRG: 918 | Disposition: A | Discharge status: Other Acute Inpatient Hospital | Attending: Family Medicine | Admitting: Family Medicine

## 2024-01-08 ENCOUNTER — Emergency Department

## 2024-01-08 ENCOUNTER — Encounter: Payer: Self-pay | Admitting: Emergency Medicine

## 2024-01-08 DIAGNOSIS — E1140 Type 2 diabetes mellitus with diabetic neuropathy, unspecified: Secondary | ICD-10-CM | POA: Diagnosis present

## 2024-01-08 DIAGNOSIS — F1210 Cannabis abuse, uncomplicated: Secondary | ICD-10-CM | POA: Diagnosis present

## 2024-01-08 DIAGNOSIS — D75839 Thrombocytosis, unspecified: Secondary | ICD-10-CM | POA: Diagnosis present

## 2024-01-08 DIAGNOSIS — T424X1A Poisoning by benzodiazepines, accidental (unintentional), initial encounter: Secondary | ICD-10-CM

## 2024-01-08 DIAGNOSIS — F419 Anxiety disorder, unspecified: Secondary | ICD-10-CM

## 2024-01-08 DIAGNOSIS — K760 Fatty (change of) liver, not elsewhere classified: Secondary | ICD-10-CM | POA: Diagnosis present

## 2024-01-08 DIAGNOSIS — T424X2A Poisoning by benzodiazepines, intentional self-harm, initial encounter: Principal | ICD-10-CM | POA: Diagnosis present

## 2024-01-08 DIAGNOSIS — Z9151 Personal history of suicidal behavior: Secondary | ICD-10-CM

## 2024-01-08 DIAGNOSIS — D72828 Other elevated white blood cell count: Secondary | ICD-10-CM | POA: Diagnosis present

## 2024-01-08 DIAGNOSIS — R112 Nausea with vomiting, unspecified: Principal | ICD-10-CM | POA: Diagnosis present

## 2024-01-08 DIAGNOSIS — R600 Localized edema: Secondary | ICD-10-CM | POA: Diagnosis present

## 2024-01-08 DIAGNOSIS — N390 Urinary tract infection, site not specified: Secondary | ICD-10-CM | POA: Diagnosis present

## 2024-01-08 DIAGNOSIS — T50902A Poisoning by unspecified drugs, medicaments and biological substances, intentional self-harm, initial encounter: Secondary | ICD-10-CM

## 2024-01-08 DIAGNOSIS — Z79899 Other long term (current) drug therapy: Secondary | ICD-10-CM

## 2024-01-08 DIAGNOSIS — F411 Generalized anxiety disorder: Secondary | ICD-10-CM | POA: Diagnosis present

## 2024-01-08 DIAGNOSIS — N200 Calculus of kidney: Secondary | ICD-10-CM | POA: Diagnosis present

## 2024-01-08 DIAGNOSIS — F4310 Post-traumatic stress disorder, unspecified: Secondary | ICD-10-CM | POA: Diagnosis present

## 2024-01-08 DIAGNOSIS — F410 Panic disorder [episodic paroxysmal anxiety] without agoraphobia: Secondary | ICD-10-CM | POA: Diagnosis present

## 2024-01-08 DIAGNOSIS — F17210 Nicotine dependence, cigarettes, uncomplicated: Secondary | ICD-10-CM | POA: Diagnosis present

## 2024-01-08 DIAGNOSIS — Z9049 Acquired absence of other specified parts of digestive tract: Secondary | ICD-10-CM

## 2024-01-08 DIAGNOSIS — Z8249 Family history of ischemic heart disease and other diseases of the circulatory system: Secondary | ICD-10-CM

## 2024-01-08 DIAGNOSIS — Z833 Family history of diabetes mellitus: Secondary | ICD-10-CM

## 2024-01-08 DIAGNOSIS — F32A Depression, unspecified: Secondary | ICD-10-CM | POA: Diagnosis present

## 2024-01-08 DIAGNOSIS — Z7984 Long term (current) use of oral hypoglycemic drugs: Secondary | ICD-10-CM

## 2024-01-08 DIAGNOSIS — Z818 Family history of other mental and behavioral disorders: Secondary | ICD-10-CM

## 2024-01-08 DIAGNOSIS — G8929 Other chronic pain: Secondary | ICD-10-CM | POA: Diagnosis present

## 2024-01-08 DIAGNOSIS — J449 Chronic obstructive pulmonary disease, unspecified: Secondary | ICD-10-CM | POA: Diagnosis present

## 2024-01-08 DIAGNOSIS — Z888 Allergy status to other drugs, medicaments and biological substances status: Secondary | ICD-10-CM

## 2024-01-08 DIAGNOSIS — E119 Type 2 diabetes mellitus without complications: Secondary | ICD-10-CM

## 2024-01-08 DIAGNOSIS — G43909 Migraine, unspecified, not intractable, without status migrainosus: Secondary | ICD-10-CM | POA: Diagnosis present

## 2024-01-08 LAB — URINALYSIS, ROUTINE W REFLEX MICROSCOPIC
Bilirubin Urine: NEGATIVE
Glucose, UA: NEGATIVE mg/dL
Hgb urine dipstick: NEGATIVE
Ketones, ur: NEGATIVE mg/dL
Nitrite: NEGATIVE
Protein, ur: NEGATIVE mg/dL
Specific Gravity, Urine: 1.02 (ref 1.005–1.030)
pH: 5 (ref 5.0–8.0)

## 2024-01-08 LAB — CBC WITH DIFFERENTIAL/PLATELET
Abs Immature Granulocytes: 0.33 K/uL — ABNORMAL HIGH (ref 0.00–0.07)
Basophils Absolute: 0.1 K/uL (ref 0.0–0.1)
Basophils Relative: 0 %
Eosinophils Absolute: 0.4 K/uL (ref 0.0–0.5)
Eosinophils Relative: 2 %
HCT: 40.1 % (ref 36.0–46.0)
Hemoglobin: 13.4 g/dL (ref 12.0–15.0)
Immature Granulocytes: 1 %
Lymphocytes Relative: 21 %
Lymphs Abs: 5.2 K/uL — ABNORMAL HIGH (ref 0.7–4.0)
MCH: 27.8 pg (ref 26.0–34.0)
MCHC: 33.4 g/dL (ref 30.0–36.0)
MCV: 83.2 fL (ref 80.0–100.0)
Monocytes Absolute: 1.2 K/uL — ABNORMAL HIGH (ref 0.1–1.0)
Monocytes Relative: 5 %
Neutro Abs: 17.5 K/uL — ABNORMAL HIGH (ref 1.7–7.7)
Neutrophils Relative %: 71 %
Platelets: 417 K/uL — ABNORMAL HIGH (ref 150–400)
RBC: 4.82 MIL/uL (ref 3.87–5.11)
RDW: 16 % — ABNORMAL HIGH (ref 11.5–15.5)
WBC: 24.6 K/uL — ABNORMAL HIGH (ref 4.0–10.5)
nRBC: 0 % (ref 0.0–0.2)

## 2024-01-08 LAB — COMPREHENSIVE METABOLIC PANEL WITH GFR
ALT: 37 U/L (ref 0–44)
AST: 36 U/L (ref 15–41)
Albumin: 4.6 g/dL (ref 3.5–5.0)
Alkaline Phosphatase: 81 U/L (ref 38–126)
Anion gap: 11 (ref 5–15)
BUN: 15 mg/dL (ref 6–20)
CO2: 21 mmol/L — ABNORMAL LOW (ref 22–32)
Calcium: 9.6 mg/dL (ref 8.9–10.3)
Chloride: 106 mmol/L (ref 98–111)
Creatinine, Ser: 0.84 mg/dL (ref 0.44–1.00)
GFR, Estimated: >60 mL/min (ref >60)
Glucose, Bld: 170 mg/dL — ABNORMAL HIGH (ref 70–99)
Potassium: 4.2 mmol/L (ref 3.5–5.1)
Sodium: 138 mmol/L (ref 135–145)
Total Bilirubin: 0.4 mg/dL (ref 0.0–1.2)
Total Protein: 8.6 g/dL — ABNORMAL HIGH (ref 6.5–8.1)

## 2024-01-08 LAB — LIPASE, BLOOD: Lipase: 37 U/L (ref 11–51)

## 2024-01-08 LAB — POC URINE PREG, ED: Preg Test, Ur: NEGATIVE

## 2024-01-08 MED ORDER — MORPHINE SULFATE (PF) 4 MG/ML IV SOLN
4.0000 mg | Freq: Once | INTRAVENOUS | Status: AC
  Administered 2024-01-08: 4 mg via INTRAVENOUS
  Filled 2024-01-08: qty 1

## 2024-01-08 MED ORDER — IOHEXOL 300 MG/ML  SOLN
100.0000 mL | Freq: Once | INTRAMUSCULAR | Status: AC | PRN
  Administered 2024-01-08: 100 mL via INTRAVENOUS

## 2024-01-08 MED ORDER — SODIUM CHLORIDE 0.9 % IV BOLUS (SEPSIS)
1000.0000 mL | Freq: Once | INTRAVENOUS | Status: AC
  Administered 2024-01-08: 1000 mL via INTRAVENOUS

## 2024-01-08 MED ORDER — ONDANSETRON HCL 4 MG/2ML IJ SOLN
4.0000 mg | Freq: Once | INTRAMUSCULAR | Status: AC
  Administered 2024-01-08: 4 mg via INTRAVENOUS
  Filled 2024-01-08: qty 2

## 2024-01-08 NOTE — Progress Notes (Signed)
 Natalie Rose returns today status post robotic assisted cholecystectomy.  She reports doing well.  She says that initially she had a lot of pain but that is subsiding.  She is tolerating a regular diet and having normal bowel function.  She denies any redness or drainage from her incisions.  On exam her abdomen is soft, some tenderness at the Veress needle insertion site and over her left lower quadrant incision.  There is some bruising at the area of Veress needle insertion site but it is improving.  There is some induration below each incision but there is no evidence of erythema or fluctuance.  Pathology discussed with her and consistent with cholecystitis.  Recommended continued lifting restrictions no greater than 10 to 15 pounds.  For another 2 weeks.  She can now submerge the wound in water.  Follow-up with us  as needed

## 2024-01-08 NOTE — ED Provider Notes (Signed)
 [facility] Provider Note    Event Date/Time   First MD Initiated Contact with Patient 01/08/24 2259     (approximate)   History   Abdominal Pain   HPI  Natalie Rose is a 43 y.o. female with history of diabetes, COPD, anxiety who presents to the emergency department with complaints of upper abdominal pain, nausea and vomiting, diarrhea that started today.  Patient underwent cholecystectomy on 12/28/2023 with Dr. Marinda for acute cholecystitis.  Was doing well until tonight.  States she is having shortness of breath.  No chest pain.  No known fevers.  No dysuria, hematuria, vaginal bleeding or discharge.  No other prior abdominal surgery.   History provided by patient, family.    Past Medical History:  Diagnosis Date   Anxiety    Arthritis    COPD (chronic obstructive pulmonary disease) (HCC)    Depression    Diabetes mellitus without complication (HCC)    Mental health disorder    Migraines     Past Surgical History:  Procedure Laterality Date   CHOLECYSTECTOMY     MOUTH SURGERY      MEDICATIONS:  Prior to Admission medications   Medication Sig Start Date End Date Taking? Authorizing Provider  ferrous sulfate (FEROSUL) 325 (65 FE) MG tablet Take 325 mg by mouth daily with breakfast.  04/29/20  [provider]  acetaminophen  (TYLENOL ) 500 MG tablet Take 2 tablets (1,000 mg total) by mouth every 8 (eight) hours. 12/29/23   Marinda Jayson KIDD, MD  albuterol  (VENTOLIN  HFA) 108 [phone] Base) MCG/ACT inhaler Inhale 2 puffs into the lungs every 4 (four) hours as needed. 08/01/23   Bernardino Ditch, NP  atenolol  (TENORMIN ) 50 MG tablet Take 1 tablet (50 mg total) by mouth daily. 09/21/22   Clapacs, Norleen DASEN, MD  clonazePAM  (KLONOPIN ) 0.5 MG tablet Take 1 tablet (0.5 mg total) by mouth 2 (two) times daily. Patient taking differently: Take 0.5 mg by mouth 2 (two) times daily as needed for anxiety. 09/21/22   Clapacs, Norleen DASEN, MD  gabapentin   (NEURONTIN ) 300 MG capsule Take 1 capsule (300 mg total) by mouth 3 (three) times daily. 09/21/22   Clapacs, Norleen DASEN, MD  LYBALVI  10-10 MG TABS Take 1 tablet by mouth daily. 12/20/22   [provider]  metFORMIN  (GLUCOPHAGE ) 500 MG tablet Take 1 tablet (500 mg total) by mouth 2 (two) times daily with a meal. 09/21/22   Clapacs, Norleen DASEN, MD  mirtazapine  (REMERON ) 45 MG tablet Take 1 tablet (45 mg total) by mouth at bedtime. 09/21/22   Clapacs, Norleen DASEN, MD  ondansetron  (ZOFRAN ) 4 MG tablet Take 1 tablet (4 mg total) by mouth every 8 (eight) hours as needed for nausea or vomiting. 12/29/23   Marinda Jayson KIDD, MD  prazosin  (MINIPRESS ) 2 MG capsule Take 2 capsules (4 mg total) by mouth at bedtime. 09/21/22   Clapacs, Norleen DASEN, MD  prazosin  (MINIPRESS ) 5 MG capsule Take 5 mg by mouth at bedtime. 12/26/22   [provider]  propranolol  ER (INDERAL  LA) 60 MG 24 hr capsule Take 60 mg by mouth at bedtime.    [provider]  QUEtiapine  (SEROQUEL ) 50 MG tablet Take 1 tablet (50 mg total) by mouth at bedtime. 01/16/23 01/07/24  Jacquetta Sharlot GRADE, NP  sertraline  (ZOLOFT ) 50 MG tablet Take 3 tablets (150 mg total) by mouth daily. 09/21/22   Clapacs, Norleen DASEN, MD  Spacer/Aero-Holding Chambers (AEROCHAMBER MV) inhaler Use as instructed 08/01/23  Bernardino Ditch, NP  sucralfate  (CARAFATE ) 1 g tablet Take 1 g by mouth 4 (four) times daily. 09/13/23   [provider]  eszopiclone (LUNESTA) 2 MG TABS tablet Take 2 mg by mouth at bedtime as needed for sleep. Take immediately before bedtime  04/29/20  [provider]  FLUoxetine  (PROZAC ) 40 MG capsule Take 40 mg by mouth daily.  10/08/19  [provider]  lurasidone (LATUDA) 40 MG TABS tablet Take 40 mg by mouth daily with breakfast.  10/08/19  [provider]    Physical Exam   Triage Vital Signs: ED Triage Vitals  Encounter Vitals Group     BP 01/08/24 2245 (!) 145/112     Girls Systolic BP Percentile --      Girls Diastolic BP  Percentile --      Boys Systolic BP Percentile --      Boys Diastolic BP Percentile --      Pulse Rate 01/08/24 2245 90     Resp 01/08/24 2245 (!) 22     Temp 01/08/24 2245 97.6 F (36.4 C)     Temp Source 01/08/24 2245 Oral     SpO2 01/08/24 2245 97 %     Weight 01/08/24 2240 180 lb (81.6 kg)     Height 01/08/24 2240 4' 11 (1.499 m)     Head Circumference --      Peak Flow --      Pain Score 01/08/24 2240 7     Pain Loc --      Pain Education --      Exclude from Growth Chart --     Most recent vital signs: Vitals:   01/08/24 2245 01/09/24 0232  BP: (!) 145/112 (!) 150/97  Pulse: 90 83  Resp: (!) 22 16  Temp: 97.6 F (36.4 C) 98.3 F (36.8 C)  SpO2: 97% 96%    CONSTITUTIONAL: Alert, responds appropriately to questions.  Appears uncomfortable, diaphoretic HEAD: Normocephalic, atraumatic EYES: Conjunctivae clear, pupils appear equal, sclera nonicteric ENT: normal nose; moist mucous membranes NECK: Supple, normal ROM CARD: RRR; S1 and S2 appreciated RESP: Normal chest excursion without splinting or tachypnea; breath sounds clear and equal bilaterally; no wheezes, no rhonchi, no rales, no hypoxia or respiratory distress, speaking full sentences ABD/GI: Non-distended; soft, tender throughout the upper abdomen without guarding or rebound, incision sites are clean, dry and intact without bleeding or surrounding redness, warmth, drainage BACK: The back appears normal EXT: Normal ROM in all joints; no deformity noted, no edema SKIN: Normal color for age and race; warm; no rash on exposed skin NEURO: Moves all extremities equally, normal speech PSYCH: The patient's mood and manner are appropriate.   ED Results / Procedures / Treatments   LABS: (all labs ordered are listed, but only abnormal results are displayed) Labs Reviewed  CBC WITH DIFFERENTIAL/PLATELET - Abnormal; Notable for the following components:      Result Value   WBC 24.6 (*)    RDW 16.0 (*)    Platelets  417 (*)    Neutro Abs 17.5 (*)    Lymphs Abs 5.2 (*)    Monocytes Absolute 1.2 (*)    Abs Immature Granulocytes 0.33 (*)    All other components within normal limits  COMPREHENSIVE METABOLIC PANEL WITH GFR - Abnormal; Notable for the following components:   CO2 21 (*)    Glucose, Bld 170 (*)    Total Protein 8.6 (*)    All other components within normal limits  URINALYSIS,  ROUTINE W REFLEX MICROSCOPIC - Abnormal; Notable for the following components:   Color, Urine YELLOW (*)    APPearance HAZY (*)    Leukocytes,Ua SMALL (*)    Bacteria, UA RARE (*)    All other components within normal limits  URINE DRUG SCREEN, QUALITATIVE ([facility] ONLY) - Abnormal; Notable for the following components:   Tricyclic, Ur Screen POSITIVE (*)    Cannabinoid 50 Ng, Ur [place] POSITIVE (*)    All other components within normal limits  URINE CULTURE  LIPASE, BLOOD  MAGNESIUM   ACETAMINOPHEN  LEVEL  SALICYLATE LEVEL  ETHANOL  HIV ANTIBODY (ROUTINE TESTING W REFLEX)  CBC  COMPREHENSIVE METABOLIC PANEL WITH GFR  ACETAMINOPHEN  LEVEL  HEMOGLOBIN A1C  POC URINE PREG, ED  TROPONIN I (HIGH SENSITIVITY)     EKG:  EKG Interpretation Date/Time:  Wednesday January 08 2024 22:48:05 EDT Ventricular Rate:  91 PR Interval:  130 QRS Duration:  78 QT Interval:  376 QTC Calculation: 462 R Axis:   79  Text Interpretation: Normal sinus rhythm Normal ECG When compared with ECG of 28-Dec-2023 02:02, PREVIOUS ECG IS PRESENT Confirmed by Neomi Neptune [phone]) on 01/08/2024 11:08:43 PM         RADIOLOGY: My personal review and interpretation of imaging: Chest x-ray clear.  CT of the abdomen pelvis showed no acute complications.  I have personally reviewed all radiology reports.   DG Chest Portable 1 View Result Date: 01/09/2024 CLINICAL DATA:  Postop cholecystectomy.  Shortness of breath EXAM: PORTABLE CHEST 1 VIEW COMPARISON:  01/11/2023 FINDINGS: Stable cardiomediastinal silhouette. No focal consolidation, pleural  effusion, or pneumothorax. No displaced rib fractures. IMPRESSION: No active disease. Electronically Signed   By: Norman Gatlin M.D.   On: 01/09/2024 00:12   CT ABDOMEN PELVIS W CONTRAST Result Date: 01/09/2024 CLINICAL DATA:  Abdominal pain, post-op Pt reports cholecystectomy last wk; c/o N/V and upper abd pain tonight EXAM: CT ABDOMEN AND PELVIS WITH CONTRAST TECHNIQUE: Multidetector CT imaging of the abdomen and pelvis was performed using the standard protocol following bolus administration of intravenous contrast. RADIATION DOSE REDUCTION: This exam was performed according to the departmental dose-optimization program which includes automated exposure control, adjustment of the mA and/or kV according to patient size and/or use of iterative reconstruction technique. CONTRAST:  OMNIPAQUE  IOHEXOL  300 MG/ML  SOLN COMPARISON:  CT abdomen pelvis 01/11/2023 FINDINGS: Lower chest: Small hiatal hernia.  No acute abnormality. Hepatobiliary: Borderline enlarged liver measuring up to 18.5 cm. The hepatic parenchyma is diffusely hypodense compared to the splenic parenchyma consistent with fatty infiltration. No focal liver abnormality. Status post cholecystectomy. No biliary dilatation. Pancreas: No focal lesion. Normal pancreatic contour. No surrounding inflammatory changes. No main pancreatic ductal dilatation. Spleen: Normal in size without focal abnormality. Adrenals/Urinary Tract: No adrenal nodule bilaterally. Bilateral kidneys enhance symmetrically. No hydronephrosis. No hydroureter. Left nephrolithiasis measuring up to 7 mm. No right nephrolithiasis. No ureterolithiasis bilaterally. The urinary bladder is decompressed. On delayed imaging, there is no urothelial wall thickening and there are no filling defects in the opacified portions of the bilateral collecting systems or ureters. Stomach/Bowel: Stomach is within normal limits. No evidence of bowel wall thickening or dilatation. Appendix appears normal.  Vascular/Lymphatic: No abdominal aorta or iliac aneurysm. No abdominal, pelvic, or inguinal lymphadenopathy. Reproductive: Uterus and bilateral adnexa are unremarkable. Other: No intraperitoneal free fluid. No intraperitoneal free gas. No organized fluid collection. Musculoskeletal: No abdominal wall hernia or abnormality. No suspicious lytic or blastic osseous lesions. No acute displaced fracture. IMPRESSION: 1. Nonobstructive  7 mm left nephrolithiasis. 2. Small hiatal hernia. 3. Hepatomegaly and hepatic steatosis. Electronically Signed   By: Morgane  Naveau M.D.   On: 01/09/2024 00:01     PROCEDURES:  Critical Care performed: YES  CRITICAL CARE Performed by: Josette Tejuan Gholson   Total critical care time: 30 minutes  Critical care time was exclusive of separately billable procedures and treating other patients.  Critical care was necessary to treat or prevent imminent or life-threatening deterioration.  Critical care was time spent personally by me on the following activities: development of treatment plan with patient and/or surrogate as well as nursing, discussions with consultants, evaluation of patient's response to treatment, examination of patient, obtaining history from patient or surrogate, ordering and performing treatments and interventions, ordering and review of laboratory studies, ordering and review of radiographic studies, pulse oximetry and re-evaluation of patient's condition.       Procedures    IMPRESSION / MDM / ASSESSMENT AND PLAN / ED COURSE  I reviewed the triage vital signs and the nursing notes.    Patient here with shortness of breath, upper domino pain, vomiting and diarrhea after cholecystectomy.  The patient is on the cardiac monitor to evaluate for evidence of arrhythmia and/or significant heart rate changes.   DIFFERENTIAL DIAGNOSIS (includes but not limited to):   Choledocholithiasis, bile leak, pancreatitis, bowel perforation, viral gastroenteritis,  PE, ACS   Patient's presentation is most consistent with acute presentation with potential threat to life or bodily function.   PLAN: Will obtain abdominal labs, urine, chest x-ray, EKG, CT of the abdomen pelvis.  Will give IV fluids, pain and nausea medicine.  Will keep NPO.   MEDICATIONS GIVEN IN ED: Medications  0.9 %  sodium chloride  infusion (has no administration in time range)  enoxaparin  (LOVENOX ) injection 40 mg (has no administration in time range)  0.9 % NaCl with KCl 20 mEq/ L  infusion (has no administration in time range)  acetaminophen  (TYLENOL ) tablet 650 mg (has no administration in time range)    Or  acetaminophen  (TYLENOL ) suppository 650 mg (has no administration in time range)  traZODone  (DESYREL ) tablet 25 mg (has no administration in time range)  magnesium  hydroxide (MILK OF MAGNESIA) suspension 30 mL (has no administration in time range)  ondansetron  (ZOFRAN ) tablet 4 mg (has no administration in time range)    Or  ondansetron  (ZOFRAN ) injection 4 mg (has no administration in time range)  insulin  aspart (novoLOG ) injection 0-15 Units (has no administration in time range)  insulin  aspart (novoLOG ) injection 0-5 Units (has no administration in time range)  sodium chloride  0.9 % bolus 1,000 mL (0 mLs Intravenous Stopped 01/09/24 0227)  morphine  (PF) 4 MG/ML injection 4 mg (4 mg Intravenous Given 01/08/24 2357)  ondansetron  (ZOFRAN ) injection 4 mg (4 mg Intravenous Given 01/08/24 2356)  iohexol  (OMNIPAQUE ) 300 MG/ML solution 100 mL (100 mLs Intravenous Contrast Given 01/08/24 2340)  cefTRIAXone  (ROCEPHIN ) 2 g in sodium chloride  0.9 % 100 mL IVPB (0 g Intravenous Stopped 01/09/24 0127)  pantoprazole  (PROTONIX ) injection 40 mg (40 mg Intravenous Given 01/09/24 0030)  ondansetron  (ZOFRAN ) injection 4 mg (4 mg Intravenous Given 01/09/24 0034)  promethazine  (PHENERGAN ) 25 mg in sodium chloride  0.9 % 50 mL IVPB (0 mg Intravenous Stopped 01/09/24 0227)  LORazepam  (ATIVAN ) injection 1 mg  (1 mg Intravenous Given 01/09/24 0105)     ED COURSE: 12:30 AM  Patient continues to vomit despite Zofran .  Will give IV Phenergan .  Labs show leukocytosis of 24,000.  Normal LFTs,  lipase.  Negative troponin.  Chest x-ray and CT of the abdomen pelvis reviewed and interpreted by myself and radiologist and show no significant abnormalities, postoperative complication, pneumonia.  She does have nephrolithiasis without ureterolithiasis.  Appendix is normal.  No free air seen.  She has had hepatic steatosis and a hiatal hernia.  Patient's urine does appear infected.  Culture pending.  Will give Rocephin .  Discussed with patient that if we cannot get her vomiting control that we may need to admit her to the hospital.  She verbalized understanding.  1:00 AM  Pt states I am freaking out and that I am having a panic attack.  Repeatedly stating help me.  She takes clonazepam  at home but given her vomiting she is not able to tolerate anything by mouth.  Will give IV Ativan .   1:55 AM  I was called into the room by nursing staff.  Family reports patient just overdosed on 10 tablets of clonazepam  0.5 mg.  She states it was not an attempt to harm herself but to help with her anxiety.  On review of her records it appears she overdosed on medications previously in the setting of nausea and vomiting to stop her nausea and vomiting and required inpatient psychiatric hospitalization.  Will place her under IVC, contact poison control, repeat EKG, obtain Tylenol  and salicylate levels now and 4 hour tylenol  level at 6am, discussed with psychiatry.  Patient continues to have intractable nausea, vomiting despite Zofran  x 2, Phenergan , Ativan .  Will continue IV hydration and admit to the hospitalist.  2:30 AM  Poison Control called, spoke with Franky to report. Recommendations:  SNS depression, sedation, unresponsiveness, observe and treat if needed. Does not recommend reversal due to risk of inducing status.  provide  6-8 hours observation and let run its course.    CONSULTS:  Consulted and discussed patient's case with hospitalist, Dr. Lawence.  I have recommended admission and consulting physician agrees and will place admission orders.  Patient (and family if present) agree with this plan.   I reviewed all nursing notes, vitals, pertinent previous records.  All labs, EKGs, imaging ordered have been independently reviewed and interpreted by myself.  2:30 AM  Dr. Marinda with general surgery who performed patient's cholecystectomy aware patient will be admitted by medicine.  OUTSIDE RECORDS REVIEWED: Reviewed recent surgical notes.       FINAL CLINICAL IMPRESSION(S) / ED DIAGNOSES   Final diagnoses:  Nausea vomiting and diarrhea  Acute UTI  Anxiety  Intentional overdose, initial encounter (HCC)     Rx / DC Orders   ED Discharge Orders     None        Note:  This document was prepared using Dragon voice recognition software and may include unintentional dictation errors.   Mina Carlisi, Josette SAILOR, DO 01/09/24 [phone]

## 2024-01-08 NOTE — ED Triage Notes (Addendum)
 Patient ambulatory to triage with steady gait, dry heaving and diaphoretic; pt reports cholecystecomy last wk; c/o N/V and upper abd pain tonight

## 2024-01-09 DIAGNOSIS — F419 Anxiety disorder, unspecified: Secondary | ICD-10-CM

## 2024-01-09 DIAGNOSIS — N390 Urinary tract infection, site not specified: Secondary | ICD-10-CM

## 2024-01-09 DIAGNOSIS — T424X1A Poisoning by benzodiazepines, accidental (unintentional), initial encounter: Secondary | ICD-10-CM

## 2024-01-09 DIAGNOSIS — E119 Type 2 diabetes mellitus without complications: Secondary | ICD-10-CM

## 2024-01-09 DIAGNOSIS — R112 Nausea with vomiting, unspecified: Secondary | ICD-10-CM | POA: Diagnosis not present

## 2024-01-09 DIAGNOSIS — T424X2A Poisoning by benzodiazepines, intentional self-harm, initial encounter: Principal | ICD-10-CM

## 2024-01-09 DIAGNOSIS — F32A Depression, unspecified: Secondary | ICD-10-CM

## 2024-01-09 LAB — CBC
HCT: 40.2 % (ref 36.0–46.0)
Hemoglobin: 13.3 g/dL (ref 12.0–15.0)
MCH: 27.6 pg (ref 26.0–34.0)
MCHC: 33.1 g/dL (ref 30.0–36.0)
MCV: 83.4 fL (ref 80.0–100.0)
Platelets: 362 K/uL (ref 150–400)
RBC: 4.82 MIL/uL (ref 3.87–5.11)
RDW: 15.9 % — ABNORMAL HIGH (ref 11.5–15.5)
WBC: 16.2 K/uL — ABNORMAL HIGH (ref 4.0–10.5)
nRBC: 0 % (ref 0.0–0.2)

## 2024-01-09 LAB — GLUCOSE, CAPILLARY
Glucose-Capillary: 161 mg/dL — ABNORMAL HIGH (ref 70–99)
Glucose-Capillary: 159 mg/dL — ABNORMAL HIGH (ref 70–99)
Glucose-Capillary: 146 mg/dL — ABNORMAL HIGH (ref 70–99)
Glucose-Capillary: 132 mg/dL — ABNORMAL HIGH (ref 70–99)
Glucose-Capillary: 119 mg/dL — ABNORMAL HIGH (ref 70–99)

## 2024-01-09 LAB — COMPREHENSIVE METABOLIC PANEL WITH GFR
ALT: 35 U/L (ref 0–44)
AST: 33 U/L (ref 15–41)
Albumin: 4 g/dL (ref 3.5–5.0)
Alkaline Phosphatase: 76 U/L (ref 38–126)
Anion gap: 10 (ref 5–15)
BUN: 13 mg/dL (ref 6–20)
CO2: 23 mmol/L (ref 22–32)
Calcium: 8.8 mg/dL — ABNORMAL LOW (ref 8.9–10.3)
Chloride: 105 mmol/L (ref 98–111)
Creatinine, Ser: 0.76 mg/dL (ref 0.44–1.00)
GFR, Estimated: >60 mL/min (ref >60)
Glucose, Bld: 160 mg/dL — ABNORMAL HIGH (ref 70–99)
Potassium: 4.5 mmol/L (ref 3.5–5.1)
Sodium: 138 mmol/L (ref 135–145)
Total Bilirubin: 0.3 mg/dL (ref 0.0–1.2)
Total Protein: 7.8 g/dL (ref 6.5–8.1)

## 2024-01-09 LAB — CBC WITH DIFFERENTIAL/PLATELET
Abs Immature Granulocytes: 0.31 K/uL — ABNORMAL HIGH (ref 0.00–0.07)
Basophils Absolute: 0 K/uL (ref 0.0–0.1)
Basophils Relative: 0 %
Eosinophils Absolute: 0 K/uL (ref 0.0–0.5)
Eosinophils Relative: 0 %
HCT: 39.9 % (ref 36.0–46.0)
Hemoglobin: 13.5 g/dL (ref 12.0–15.0)
Immature Granulocytes: 2 %
Lymphocytes Relative: 10 %
Lymphs Abs: 1.8 K/uL (ref 0.7–4.0)
MCH: 28 pg (ref 26.0–34.0)
MCHC: 33.8 g/dL (ref 30.0–36.0)
MCV: 82.6 fL (ref 80.0–100.0)
Monocytes Absolute: 0.6 K/uL (ref 0.1–1.0)
Monocytes Relative: 3 %
Neutro Abs: 15.4 K/uL — ABNORMAL HIGH (ref 1.7–7.7)
Neutrophils Relative %: 85 %
Platelets: 384 K/uL (ref 150–400)
RBC: 4.83 MIL/uL (ref 3.87–5.11)
RDW: 16 % — ABNORMAL HIGH (ref 11.5–15.5)
WBC: 18.2 K/uL — ABNORMAL HIGH (ref 4.0–10.5)
nRBC: 0 % (ref 0.0–0.2)

## 2024-01-09 LAB — URINE DRUG SCREEN, QUALITATIVE (ARMC ONLY)
Amphetamines, Ur Screen: NOT DETECTED
Barbiturates, Ur Screen: NOT DETECTED
Benzodiazepine, Ur Scrn: NOT DETECTED
Cannabinoid 50 Ng, Ur ~~LOC~~: POSITIVE — AB
Cocaine Metabolite,Ur ~~LOC~~: NOT DETECTED
MDMA (Ecstasy)Ur Screen: NOT DETECTED
Methadone Scn, Ur: NOT DETECTED
Opiate, Ur Screen: NOT DETECTED
Phencyclidine (PCP) Ur S: NOT DETECTED
Tricyclic, Ur Screen: POSITIVE — AB

## 2024-01-09 LAB — MAGNESIUM: Magnesium: 2.1 mg/dL (ref 1.7–2.4)

## 2024-01-09 LAB — ACETAMINOPHEN LEVEL
Acetaminophen (Tylenol), Serum: <10 ug/mL — ABNORMAL LOW (ref 10–30)
Acetaminophen (Tylenol), Serum: <10 ug/mL — ABNORMAL LOW (ref 10–30)

## 2024-01-09 LAB — HEMOGLOBIN A1C
Hgb A1c MFr Bld: 6.7 % — ABNORMAL HIGH (ref 4.8–5.6)
Mean Plasma Glucose: 146 mg/dL

## 2024-01-09 LAB — TECHNOLOGIST SMEAR REVIEW: Plt Morphology: NORMAL

## 2024-01-09 LAB — HIV ANTIBODY (ROUTINE TESTING W REFLEX): HIV Screen 4th Generation wRfx: NONREACTIVE

## 2024-01-09 LAB — SALICYLATE LEVEL: Salicylate Lvl: <7 mg/dL — ABNORMAL LOW (ref 7.0–30.0)

## 2024-01-09 LAB — ETHANOL: Alcohol, Ethyl (B): <15 mg/dL (ref <15)

## 2024-01-09 LAB — TROPONIN I (HIGH SENSITIVITY): Troponin I (High Sensitivity): 5 ng/L (ref <18)

## 2024-01-09 MED ORDER — PANTOPRAZOLE SODIUM 40 MG IV SOLR
40.0000 mg | Freq: Once | INTRAVENOUS | Status: AC
  Administered 2024-01-09: 40 mg via INTRAVENOUS
  Filled 2024-01-09: qty 10

## 2024-01-09 MED ORDER — ACETAMINOPHEN 325 MG PO TABS: 650.0000 mg | ORAL_TABLET | Freq: Four times a day (QID) | ORAL | Status: DC | PRN

## 2024-01-09 MED ORDER — ONDANSETRON HCL 4 MG/2ML IJ SOLN
4.0000 mg | Freq: Once | INTRAMUSCULAR | Status: AC
  Administered 2024-01-09: 4 mg via INTRAVENOUS
  Filled 2024-01-09: qty 2

## 2024-01-09 MED ORDER — MAGNESIUM HYDROXIDE 400 MG/5ML PO SUSP: 30.0000 mL | Freq: Every day | ORAL | Status: DC | PRN

## 2024-01-09 MED ORDER — ONDANSETRON HCL 4 MG PO TABS: 4.0000 mg | ORAL_TABLET | Freq: Four times a day (QID) | ORAL | Status: DC | PRN

## 2024-01-09 MED ORDER — POTASSIUM CHLORIDE IN NACL 20-0.9 MEQ/L-% IV SOLN
INTRAVENOUS | Status: DC
  Filled 2024-01-09: qty 1000

## 2024-01-09 MED ORDER — INSULIN ASPART 100 UNIT/ML IJ SOLN
0.0000 [IU] | Freq: Three times a day (TID) | INTRAMUSCULAR | Status: DC
  Administered 2024-01-09: 3 [IU] via SUBCUTANEOUS
  Administered 2024-01-09: 2 [IU] via SUBCUTANEOUS
  Administered 2024-01-09: 3 [IU] via SUBCUTANEOUS
  Administered 2024-01-09 – 2024-01-11 (×2): 2 [IU] via SUBCUTANEOUS
  Filled 2024-01-09 (×5): qty 1

## 2024-01-09 MED ORDER — SODIUM CHLORIDE 0.9 % IV SOLN
2.0000 g | INTRAVENOUS | Status: DC
  Administered 2024-01-09 – 2024-01-10 (×2): 2 g via INTRAVENOUS
  Filled 2024-01-09 (×3): qty 20

## 2024-01-09 MED ORDER — ONDANSETRON HCL 4 MG/2ML IJ SOLN
4.0000 mg | Freq: Four times a day (QID) | INTRAMUSCULAR | Status: DC | PRN
  Administered 2024-01-09: 4 mg via INTRAVENOUS
  Filled 2024-01-09: qty 2

## 2024-01-09 MED ORDER — SODIUM CHLORIDE 0.9 % IV SOLN: INTRAVENOUS | Status: DC

## 2024-01-09 MED ORDER — ENOXAPARIN SODIUM 40 MG/0.4ML IJ SOSY
40.0000 mg | PREFILLED_SYRINGE | INTRAMUSCULAR | Status: DC
  Administered 2024-01-09 – 2024-01-11 (×3): 40 mg via SUBCUTANEOUS
  Filled 2024-01-09 (×3): qty 0.4

## 2024-01-09 MED ORDER — TRAZODONE HCL 50 MG PO TABS
25.0000 mg | ORAL_TABLET | Freq: Every evening | ORAL | Status: DC | PRN
  Administered 2024-01-10: 25 mg via ORAL
  Filled 2024-01-09: qty 1

## 2024-01-09 MED ORDER — SODIUM CHLORIDE 0.9 % IV SOLN
2.0000 g | Freq: Once | INTRAVENOUS | Status: AC
  Administered 2024-01-09: 2 g via INTRAVENOUS
  Filled 2024-01-09: qty 20

## 2024-01-09 MED ORDER — ACETAMINOPHEN 650 MG RE SUPP: 650.0000 mg | Freq: Four times a day (QID) | RECTAL | Status: DC | PRN

## 2024-01-09 MED ORDER — INSULIN ASPART 100 UNIT/ML IJ SOLN: 0.0000 [IU] | Freq: Every day | INTRAMUSCULAR | Status: DC

## 2024-01-09 MED ORDER — SODIUM CHLORIDE 0.9 % IV SOLN
25.0000 mg | Freq: Once | INTRAVENOUS | Status: AC
  Administered 2024-01-09: 25 mg via INTRAVENOUS
  Filled 2024-01-09: qty 1

## 2024-01-09 MED ORDER — LORAZEPAM 2 MG/ML IJ SOLN
1.0000 mg | Freq: Once | INTRAMUSCULAR | Status: AC
  Administered 2024-01-09: 1 mg via INTRAVENOUS
  Filled 2024-01-09: qty 1

## 2024-01-09 MED ORDER — LORAZEPAM 2 MG/ML IJ SOLN: 1.0000 mg | Freq: Once | INTRAMUSCULAR | Status: DC

## 2024-01-09 MED ORDER — ONDANSETRON HCL 4 MG PO TABS: 4.0000 mg | ORAL_TABLET | Freq: Three times a day (TID) | ORAL | Status: DC | PRN

## 2024-01-09 NOTE — H&P (Signed)
 [place]   PATIENT NAME: Natalie Rose    MR#:  [id number]  DATE OF BIRTH:  1980/11/04  DATE OF ADMISSION:  01/08/2024  PRIMARY CARE PHYSICIAN: [facility]   Patient is coming from: Home  REQUESTING/REFERRING PHYSICIAN: Ward, Josette SAILOR, DO    CHIEF COMPLAINT:   Chief Complaint  Patient presents with   Abdominal Pain    HISTORY OF PRESENT ILLNESS:  Natalie Rose is a 43 y.o. female with medical history significant for COPD, anxiety, depression, type 2 diabetes mellitus, PTSD and migraine, who presented with acute onset of epigastric abdominal pain as well as intractable nausea and vomiting that started today with associated diarrhea.  The patient had a cholecystectomy on 12/28/2023 by Dr. Marinda for acute cholecystitis.  She has been doing fairly well until tonight.  She denies any bilious vomitus or hematemesis.  No melena or bright red bleeding per rectum.  No dysuria, oliguria or hematuria or flank pain.  She denies any other bleeding diathesis.  No cough or wheezing or dyspnea.  No chest pain or palpitations.  While in the emergency room the patient overdosed on 10 tablets of Klonopin .  She stated that she was just anxious and not trying to harm herself.  ED Course: Upon presentation to the ER, BP was 145/112 with respiratory rate of 22 temperature of 90 with otherwise normal vital signs.  Labs revealed leukocytosis of 24.6 with neutrophilia and thrombocytosis.  Tylenol  level was less than 10 salicylate less than 7 and an urine pregnancy test was negative.  UA showed 11-20 WBCs with rare bacteria and small leukocytes and 6-10 RBCs.  Urine drug screen was positive for cannabinoids and tricyclics.  Urine culture was sent.  EKG as reviewed by me :  Showed normal sinus rhythm with a rate of 91 with poor R wave progression. Imaging: Portable chest x-ray showed no acute cardiopulmonary disease. Abdominal pelvic CT scan with contrast revealed the following: 1.  Nonobstructive 7 mm left nephrolithiasis. 2. Small hiatal hernia. 3. Hepatomegaly and hepatic steatosis.  The patient was given 1 mg of IV Ativan , 4 mg of IV morphine  sulfate, 4 mg of IV Zofran  twice, 40 mg of IV Protonix  and 25 mg of IV Phenergan  as well as 1 L bolus of IV normal saline.  IVC was done after overdose and she has a psychiatry consult ordered.  She will be admitted to a medical telemetry observation bed for further evaluation and management. PAST MEDICAL HISTORY:   Past Medical History:  Diagnosis Date   Anxiety    Arthritis    COPD (chronic obstructive pulmonary disease) (HCC)    Depression    Diabetes mellitus without complication (HCC)    Mental health disorder    Migraines     PAST SURGICAL HISTORY:   Past Surgical History:  Procedure Laterality Date   CHOLECYSTECTOMY     MOUTH SURGERY      SOCIAL HISTORY:   Social History   Tobacco Use   Smoking status: Every Day    Current packs/day: 1.00    Average packs/day: 1 pack/day for 21.6 years (21.6 ttl pk-yrs)    Types: Cigarettes    Start date: 2004   Smokeless tobacco: Never  Substance Use Topics   Alcohol use: Not Currently    FAMILY HISTORY:   Family History  Problem Relation Age of Onset   Osteopenia Mother    Heart failure Mother    Osteoarthritis Mother  Depression Mother    Diabetes Father     DRUG ALLERGIES:   Allergies  Allergen Reactions   Diazepam  Hives, Anxiety, Itching, Other (See Comments) and Rash    unknown unknown    Lurasidone     REVIEW OF SYSTEMS:   ROS As per history of present illness. All pertinent systems were reviewed above. Constitutional, HEENT, cardiovascular, respiratory, GI, GU, musculoskeletal, neuro, psychiatric, endocrine, integumentary and hematologic systems were reviewed and are otherwise negative/unremarkable except for positive findings mentioned above in the HPI.   MEDICATIONS AT HOME:   Prior to Admission medications   Medication Sig Start  Date End Date Taking? Authorizing Provider  acetaminophen  (TYLENOL ) 500 MG tablet Take 2 tablets (1,000 mg total) by mouth every 8 (eight) hours. 12/29/23  Yes Marinda Jayson KIDD, MD  albuterol  (VENTOLIN  HFA) [phone] Base) MCG/ACT inhaler Inhale 2 puffs into the lungs every 4 (four) hours as needed. 08/01/23  Yes Bernardino Ditch, NP  clonazePAM  (KLONOPIN ) 0.5 MG tablet Take 1 tablet (0.5 mg total) by mouth 2 (two) times daily. Patient taking differently: Take 0.5 mg by mouth 2 (two) times daily as needed for anxiety. 09/21/22  Yes Clapacs, Norleen DASEN, MD  gabapentin  (NEURONTIN ) 300 MG capsule Take 1 capsule (300 mg total) by mouth 3 (three) times daily. 09/21/22  Yes Clapacs, Norleen DASEN, MD  LYBALVI  10-10 MG TABS Take 1 tablet by mouth daily. 12/20/22  Yes [provider]  metFORMIN  (GLUCOPHAGE ) 500 MG tablet Take 1 tablet (500 mg total) by mouth 2 (two) times daily with a meal. 09/21/22  Yes Clapacs, Norleen DASEN, MD  mirtazapine  (REMERON ) 45 MG tablet Take 1 tablet (45 mg total) by mouth at bedtime. 09/21/22  Yes Clapacs, Norleen DASEN, MD  ondansetron  (ZOFRAN ) 4 MG tablet Take 1 tablet (4 mg total) by mouth every 8 (eight) hours as needed for nausea or vomiting. 12/29/23  Yes Marinda Jayson KIDD, MD  prazosin  (MINIPRESS ) 2 MG capsule Take 2 capsules (4 mg total) by mouth at bedtime. 09/21/22  Yes Clapacs, Norleen DASEN, MD  prazosin  (MINIPRESS ) 5 MG capsule Take 5 mg by mouth at bedtime. 12/26/22  Yes [provider]  propranolol  ER (INDERAL  LA) 60 MG 24 hr capsule Take 60 mg by mouth at bedtime.   Yes [provider]  sertraline  (ZOLOFT ) 50 MG tablet Take 3 tablets (150 mg total) by mouth daily. 09/21/22  Yes Clapacs, Norleen DASEN, MD  ferrous sulfate (FEROSUL) 325 (65 FE) MG tablet Take 325 mg by mouth daily with breakfast.  04/29/20  [provider]  atenolol  (TENORMIN ) 50 MG tablet Take 1 tablet (50 mg total) by mouth daily. 09/21/22   Clapacs, Norleen DASEN, MD  QUEtiapine  (SEROQUEL ) 50 MG tablet Take 1 tablet (50 mg  total) by mouth at bedtime. 01/16/23 01/07/24  Jacquetta Sharlot GRADE, NP  Spacer/Aero-Holding Chambers (AEROCHAMBER MV) inhaler Use as instructed 08/01/23   Bernardino Ditch, NP  sucralfate  (CARAFATE ) 1 g tablet Take 1 g by mouth 4 (four) times daily. 09/13/23   [provider]  eszopiclone (LUNESTA) 2 MG TABS tablet Take 2 mg by mouth at bedtime as needed for sleep. Take immediately before bedtime  04/29/20  [provider]  FLUoxetine  (PROZAC ) 40 MG capsule Take 40 mg by mouth daily.  10/08/19  [provider]  lurasidone (LATUDA) 40 MG TABS tablet Take 40 mg by mouth daily with breakfast.  10/08/19  [provider]      VITAL SIGNS:  Blood pressure ROLLEN)  119/102, pulse 68, temperature 98 F (36.7 C), resp. rate 16, height 4' 11 (1.499 m), weight 81.6 kg, last menstrual period 12/28/2023, SpO2 97%.  PHYSICAL EXAMINATION:  Physical Exam  GENERAL:  43 y.o.-year-old patient lying in the bed with no acute distress.  EYES: Pupils equal, round, reactive to light and accommodation. No scleral icterus. Extraocular muscles intact.  HEENT: Head atraumatic, normocephalic. Oropharynx and nasopharynx clear.  NECK:  Supple, no jugular venous distention. No thyroid  enlargement, no tenderness.  LUNGS: Normal breath sounds bilaterally, no wheezing, rales,rhonchi or crepitation. No use of accessory muscles of respiration.  CARDIOVASCULAR: Regular rate and rhythm, S1, S2 normal. No murmurs, rubs, or gallops.  ABDOMEN: Soft, nondistended, nontender. Bowel sounds present. No organomegaly or mass.  EXTREMITIES: No pedal edema, cyanosis, or clubbing.  NEUROLOGIC: Cranial nerves II through XII are intact. Muscle strength 5/5 in all extremities. Sensation intact. Gait not checked.  PSYCHIATRIC: The patient is alert and oriented x 3.  Normal affect and good eye contact. SKIN: No obvious rash, lesion, or ulcer.   LABORATORY PANEL:   CBC Recent Labs  Lab 01/09/24 0303  WBC 16.2*  HGB 13.3   HCT 40.2  PLT 362   ------------------------------------------------------------------------------------------------------------------  Chemistries  Recent Labs  Lab 01/08/24 2252 01/09/24 0303  NA 138 138  K 4.2 4.5  CL 106 105  CO2 21* 23  GLUCOSE 170* 160*  BUN 15 13  CREATININE 0.84 0.76  CALCIUM 9.6 8.8*  MG 2.1  --   AST 36 33  ALT 37 35  ALKPHOS 81 76  BILITOT 0.4 0.3   ------------------------------------------------------------------------------------------------------------------  Cardiac Enzymes No results for input(s): TROPONINI in the last 168 hours. ------------------------------------------------------------------------------------------------------------------  RADIOLOGY:  DG Chest Portable 1 View Result Date: 01/09/2024 CLINICAL DATA:  Postop cholecystectomy.  Shortness of breath EXAM: PORTABLE CHEST 1 VIEW COMPARISON:  01/11/2023 FINDINGS: Stable cardiomediastinal silhouette. No focal consolidation, pleural effusion, or pneumothorax. No displaced rib fractures. IMPRESSION: No active disease. Electronically Signed   By: Norman Gatlin M.D.   On: 01/09/2024 00:12   CT ABDOMEN PELVIS W CONTRAST Result Date: 01/09/2024 CLINICAL DATA:  Abdominal pain, post-op Pt reports cholecystectomy last wk; c/o N/V and upper abd pain tonight EXAM: CT ABDOMEN AND PELVIS WITH CONTRAST TECHNIQUE: Multidetector CT imaging of the abdomen and pelvis was performed using the standard protocol following bolus administration of intravenous contrast. RADIATION DOSE REDUCTION: This exam was performed according to the departmental dose-optimization program which includes automated exposure control, adjustment of the mA and/or kV according to patient size and/or use of iterative reconstruction technique. CONTRAST:  OMNIPAQUE  IOHEXOL  300 MG/ML  SOLN COMPARISON:  CT abdomen pelvis 01/11/2023 FINDINGS: Lower chest: Small hiatal hernia.  No acute abnormality. Hepatobiliary: Borderline  enlarged liver measuring up to 18.5 cm. The hepatic parenchyma is diffusely hypodense compared to the splenic parenchyma consistent with fatty infiltration. No focal liver abnormality. Status post cholecystectomy. No biliary dilatation. Pancreas: No focal lesion. Normal pancreatic contour. No surrounding inflammatory changes. No main pancreatic ductal dilatation. Spleen: Normal in size without focal abnormality. Adrenals/Urinary Tract: No adrenal nodule bilaterally. Bilateral kidneys enhance symmetrically. No hydronephrosis. No hydroureter. Left nephrolithiasis measuring up to 7 mm. No right nephrolithiasis. No ureterolithiasis bilaterally. The urinary bladder is decompressed. On delayed imaging, there is no urothelial wall thickening and there are no filling defects in the opacified portions of the bilateral collecting systems or ureters. Stomach/Bowel: Stomach is within normal limits. No evidence of bowel wall thickening or dilatation. Appendix appears normal. Vascular/Lymphatic:  No abdominal aorta or iliac aneurysm. No abdominal, pelvic, or inguinal lymphadenopathy. Reproductive: Uterus and bilateral adnexa are unremarkable. Other: No intraperitoneal free fluid. No intraperitoneal free gas. No organized fluid collection. Musculoskeletal: No abdominal wall hernia or abnormality. No suspicious lytic or blastic osseous lesions. No acute displaced fracture. IMPRESSION: 1. Nonobstructive 7 mm left nephrolithiasis. 2. Small hiatal hernia. 3. Hepatomegaly and hepatic steatosis. Electronically Signed   By: Morgane  Naveau M.D.   On: 01/09/2024 00:01      IMPRESSION AND PLAN:  Assessment and Plan: * Intractable nausea and vomiting - She will be admitted to the medical telemetry observation bed. - She will be hydrated with IV normal saline. - Will place her on as needed antiemetics. - General Surgery consult to be obtained given her recent cholecystectomy. - Dr. Marinda was notified by the patient.  Acute  lower UTI - This is possible and could be early. - Will follow urine culture and sensitivity. - Will cover with IV Rocephin  for now.  Benzodiazepine overdose - This is Klonopin  overdose. - This could be suicidal though the patient stated that she took it for anxiety. - Psychiatry consult will be obtained. - She will be on suicidal precautions.  Anxiety and depression - Will continue Zoloft , Lybalvi , Remeron  and Seroquel . - Klonopin  will be held off.  Type 2 diabetes mellitus without complications (HCC) - The patient will be placed on supplemental coverage with NovoLog . - Will hold off metformin . - Will continue Neurontin  for peripheral neuropathy.       DVT prophylaxis: Lovenox .  Advanced Care Planning:  Code Status: full code.  Family Communication:  The plan of care was discussed in details with the patient (and family). I answered all questions. The patient agreed to proceed with the above mentioned plan. Further management will depend upon hospital course. Disposition Plan: Back to previous home environment Consults called: Psychiatry and surgery All the records are reviewed and case discussed with ED provider.  Status is: Observation   At the time of the admission, it appears that the appropriate admission status for this patient is inpatient.  This is judged to be reasonable and necessary in order to provide the required intensity of service to ensure the patient's safety given the presenting symptoms, physical exam findings and initial radiographic and laboratory data in the context of comorbid conditions.  The patient requires inpatient status due to high intensity of service, high risk of further deterioration and high frequency of surveillance required.  I certify that at the time of admission, it is my clinical judgment that the patient will require  hospital care extending LESS than 2 midnights.                            Dispo: The patient is from: Home               Anticipated d/c is to: Home              Patient currently is not medically stable to d/c.              Difficult to place patient: No  Madison DELENA Peaches M.D on 01/09/2024 at 5:58 AM  [facility]   From 7 PM-7 AM, contact night-coverage [email or web address]  CC: Primary care physician; [facility]

## 2024-01-09 NOTE — ED Notes (Signed)
 Pt observed with her finger down her throat.

## 2024-01-09 NOTE — ED Notes (Signed)
 Pt sent to floor in wheelchair with Sitter, security, and with belongings following up behind pt.

## 2024-01-09 NOTE — Assessment & Plan Note (Addendum)
-   She will be admitted to the medical telemetry observation bed. - She will be hydrated with IV normal saline. - Will place her on as needed antiemetics. - General Surgery consult to be obtained given her recent cholecystectomy. - Dr. Marinda was notified by the patient.

## 2024-01-09 NOTE — ED Notes (Addendum)
 Klonopin  0.5mg  Unknown amount, witness seen a handful and sts approx 10.   Poison Control called, spoke with Franky to report. Recommendations:  SNS depression, sedation, unresponsiveness, observe and treat if needed. Does not recommend reversal due to risk of inducing statis. provide  6-8 hours observation and let run its course.

## 2024-01-09 NOTE — Progress Notes (Signed)
   Interim no charge progress note   This patient, Natalie Rose is a 43 y.o. female who was admitted by colleague earlier this morning for nausea, vomiting, possible UTI.  While in the ED she reportedly took 10 tabs of home Klonopin  and was admitted for observation. On my evaluation patient is still sedated.  She is able to follow commands and answer simple questions but immediately falls back to sleep.  Bedside sitter reports no issues with agitation.  Leukocytosis is improving. I updated her mother, Mrs. Leonce, on the phone.  Ms. Leonce has psychiatric collateral to share with the psychiatry team when needed. For now we will continue with ceftriaxone , IV fluids, and close monitoring.  Expect as patient becomes more alert she can provide more guidance regarding her current symptoms and thought process prior to klonipin. Of note patient does have leukocytosis but this appears persistent for the last 1 year.  Will obtain peripheral smear.  Patient did have hematology visit for this in 2023.     01/09/2024    7:42 AM 01/09/2024    2:52 AM 01/09/2024    2:32 AM  Vitals with BMI  Systolic 149 119 849  Diastolic 82 102 97  Pulse 92 68 83       Latest Ref Rng & Units 01/09/2024    3:03 AM 01/08/2024   10:52 PM 12/28/2023    1:22 AM  CBC  WBC 4.0 - 10.5 K/uL 16.2  24.6  17.7   Hemoglobin 12.0 - 15.0 g/dL 86.6  86.5  86.0   Hematocrit 36.0 - 46.0 % 40.2  40.1  43.0   Platelets 150 - 400 K/uL 362  417  318        Latest Ref Rng & Units 01/09/2024    3:03 AM 01/08/2024   10:52 PM 12/28/2023    1:22 AM  BMP  Glucose 70 - 99 mg/dL 839  829  826   BUN 6 - 20 mg/dL 13  15  19    Creatinine 0.44 - 1.00 mg/dL 9.23  9.15  9.23   Sodium 135 - 145 mmol/L 138  138  138   Potassium 3.5 - 5.1 mmol/L 4.5  4.2  3.7   Chloride 98 - 111 mmol/L 105  106  103   CO2 22 - 32 mmol/L 23  21  23    Calcium 8.9 - 10.3 mg/dL 8.8  9.6  9.8

## 2024-01-09 NOTE — ED Notes (Signed)
 Pt stated she is very anxious and afraid.  Mother (on the phone) stated that she didn't get her psych medications when she was here last time and that she hasn't been able to keep any of her home meds down so she has been without them.

## 2024-01-09 NOTE — Progress Notes (Signed)
 This RN provided update on pt to poison control via telephone.

## 2024-01-09 NOTE — ED Notes (Signed)
 Pt dressed out into burgundy scrubs with this tech, Thersia, EDT, Nathanael, RN, and [place], RN in the rm. Pt belongings consist of: one cell phone with a cracked screen, one white cell phone charger, one pink shirt, one blue shirt, one gray bra, a pair of gray/white tennis shoes, purple panties, pink/black/white checkered pajama pants, one gray hairbow, gray socks, a [employer] tar heels bag with two $5 dollar bills, four $1 dollar bills, several cards, and loose change. Pt belongings placed into one pt belongings bag and labeled with pt name. Pt calm and cooperative while dressing out.   Pt jacket and necklaces were sent home with pt family.

## 2024-01-09 NOTE — Assessment & Plan Note (Signed)
-   The patient will be placed on supplemental coverage with NovoLog . - Will hold off metformin . - Will continue Neurontin  for peripheral neuropathy.

## 2024-01-09 NOTE — Care Management Obs Status (Signed)
 MEDICARE OBSERVATION STATUS NOTIFICATION   Patient Details  Name: Natalie Rose MRN: [id number] Date of Birth: July 05, 1980   Medicare Observation Status Notification Given:  No (patient did not want a copy)    Rojelio SHAUNNA Rattler 01/09/2024, 12:11 PM

## 2024-01-09 NOTE — BH Assessment (Addendum)
 Per Steen, RN pt is lethargic  and unable to participate in assessment. Pt will be seen at a later time.

## 2024-01-09 NOTE — TOC CM/SW Note (Signed)
 Transition of Care [facility]) - Inpatient Brief Assessment   Patient Details  Name: Natalie Rose MRN: [id number] Date of Birth: [birth year]  Transition of Care [facility]) CM/SW Contact:    Corean ONEIDA Haddock, RN Phone Number: 01/09/2024, 10:09 AM   Clinical Narrative:    Transition of Care [facility]) Screening Note   Patient Details  Name: Natalie Rose Date of Birth: 02-28-1981   Transition of Care [facility]) CM/SW Contact:    Corean ONEIDA Haddock, RN Phone Number: 01/09/2024, 10:09 AM    Transition of Care Department [facility]) has reviewed patient and no TOC needs have been identified at this time.  If new patient transition needs arise, please place a TOC consult.   Transition of Care Asessment: Insurance and Status: Insurance coverage has been reviewed Patient has primary care physician: Yes     Prior/Current Home Services: No current home services Social Drivers of Health Review: SDOH reviewed no interventions necessary Readmission risk has been reviewed: Yes Transition of care needs: no transition of care needs at this time

## 2024-01-09 NOTE — Assessment & Plan Note (Signed)
 Natalie Rose

## 2024-01-09 NOTE — Assessment & Plan Note (Signed)
-   This is Klonopin  overdose. - This could be suicidal though the patient stated that she took it for anxiety. - Psychiatry consult will be obtained. - She will be on suicidal precautions.

## 2024-01-09 NOTE — Assessment & Plan Note (Signed)
 This is

## 2024-01-09 NOTE — Progress Notes (Signed)
 Psychiatry came by to see the patient but she was sedated and unable to engage in meaningful interview. She will respond to verbal commands but slips into sleep with in seconds. Will check on her later when more alert

## 2024-01-09 NOTE — Assessment & Plan Note (Addendum)
-   Will continue Zoloft , Lybalvi , Remeron  and Seroquel . - Klonopin  will be held off.

## 2024-01-09 NOTE — Consult Note (Signed)
 Patient ID: Natalie Rose, female   DOB: 03/16/1981, 43 y.o.   MRN: [id number] CC: Nausea and vomiting History of Present Illness Dionisia Pacholski is a 43 y.o. female with last medical history significant for COPD and psychiatric illness who presents in consultation for nausea and vomiting.  The patient underwent a robotic assisted cholecystectomy on July 26.  She actually saw me in clinic 2 days ago and at that time was doing well.  She reports that last night she developed profuse nausea and vomiting.  She says that she could not keep anything down and had multiple episodes of emesis.  She came to the emergency department for evaluation and a CT scan did not show any acute abnormalities.  She also had labs that were within normal limits save for a leukocytosis.  She was then found to have ingested 10 of her own Klonopin .  On exam today she is still quite sleepy but she will arouse to voice.  She says that the nausea and vomiting is better today.  She denies any abdominal pain.  She reports that she has been passing flatus and is actually been having loose stools.  She says the nausea did start after smoking marijuana..  Past Medical History Past Medical History:  Diagnosis Date   Anxiety    Arthritis    COPD (chronic obstructive pulmonary disease) (HCC)    Depression    Diabetes mellitus without complication (HCC)    Mental health disorder    Migraines        Past Surgical History:  Procedure Laterality Date   CHOLECYSTECTOMY     MOUTH SURGERY      Allergies  Allergen Reactions   Diazepam  Hives, Anxiety, Itching, Other (See Comments) and Rash    unknown unknown    Lurasidone     Current Facility-Administered Medications  Medication Dose Route Frequency Provider Last Rate Last Admin   0.9 %  sodium chloride  infusion   Intravenous Continuous Ward, Kristen N, DO 125 mL/hr at 01/09/24 1120 New Bag at 01/09/24 1120   acetaminophen  (TYLENOL ) tablet 650 mg  650 mg  Oral Q6H PRN Mansy, Jan A, MD       Or   acetaminophen  (TYLENOL ) suppository 650 mg  650 mg Rectal Q6H PRN Mansy, Jan A, MD       cefTRIAXone  (ROCEPHIN ) 2 g in sodium chloride  0.9 % 100 mL IVPB  2 g Intravenous Q24H Mansy, Jan A, MD       enoxaparin  (LOVENOX ) injection 40 mg  40 mg Subcutaneous Q24H Mansy, Jan A, MD   40 mg at 01/09/24 9092   insulin  aspart (novoLOG ) injection 0-15 Units  0-15 Units Subcutaneous TID WC Mansy, Jan A, MD   2 Units at 01/09/24 1252   insulin  aspart (novoLOG ) injection 0-5 Units  0-5 Units Subcutaneous QHS Mansy, Jan A, MD       magnesium  hydroxide (MILK OF MAGNESIA) suspension 30 mL  30 mL Oral Daily PRN Mansy, Jan A, MD       ondansetron  (ZOFRAN ) tablet 4 mg  4 mg Oral Q6H PRN Mansy, Jan A, MD       Or   ondansetron  (ZOFRAN ) injection 4 mg  4 mg Intravenous Q6H PRN Mansy, Jan A, MD   4 mg at 01/09/24 1117   traZODone  (DESYREL ) tablet 25 mg  25 mg Oral QHS PRN Mansy, Madison LABOR, MD        Family History Family History  Problem Relation Age of  Onset   Osteopenia Mother    Heart failure Mother    Osteoarthritis Mother    Depression Mother    Diabetes Father        Social History Social History   Tobacco Use   Smoking status: Every Day    Current packs/day: 1.00    Average packs/day: 1 pack/day for 21.6 years (21.6 ttl pk-yrs)    Types: Cigarettes    Start date: 2004   Smokeless tobacco: Never  Vaping Use   Vaping status: Never Used  Substance Use Topics   Alcohol use: Not Currently   Drug use: Yes    Frequency: 3.0 times per week    Types: Marijuana        ROS Full ROS of systems performed and is otherwise negative there than what is stated in the HPI  Physical Exam Blood pressure (!) 149/82, pulse 92, temperature 98.2 F (36.8 C), temperature source Oral, resp. rate 19, height 4' 11 (1.499 m), weight 81.6 kg, last menstrual period 12/28/2023, SpO2 97%. Patient is sleepy but will awaken to voice and interact with me during exam, alert and  oriented, normal work of breathing room air, regular rate and rhythm, abdomen is soft, mild tenderness in the left over the largest incision, no rebound tenderness or guarding no jaundice, moving all extremities spontaneously.   Data Reviewed Personally reviewed the patient's labs she does have a leukocytosis but this has been apparent on her labs for some time, LFTs within normal limits and total bilirubin within normal limits as well.  I independently reviewed her CT scan.  There is evidence of cholecystectomy but no other anatomic abnormalities that may be causing her nausea or vomiting.  I have personally reviewed the patient's imaging and medical records.    Assessment/Plan    Patient with persistent nausea and vomiting status post robotic assisted cholecystectomy.  I do not see any anatomic abnormalities that are causing her symptoms.  The differential is wide in this.  This includes gastroenteritis versus marijuana induced emesis, medication side effects, functional GI problems and  less likely is something like sphincter of Oddi dysfunction but this would have to be persistent and prolonged before any intervention needed for that.  For now recommend supportive care.     Jayson MALVA Endow 01/09/2024, 2:39 PM

## 2024-01-09 NOTE — Assessment & Plan Note (Signed)
-   This is possible and could be early. - Will follow urine culture and sensitivity. - Will cover with IV Rocephin  for now.

## 2024-01-10 ENCOUNTER — Observation Stay

## 2024-01-10 DIAGNOSIS — T424X2A Poisoning by benzodiazepines, intentional self-harm, initial encounter: Secondary | ICD-10-CM | POA: Diagnosis present

## 2024-01-10 DIAGNOSIS — Z9151 Personal history of suicidal behavior: Secondary | ICD-10-CM | POA: Diagnosis not present

## 2024-01-10 DIAGNOSIS — D75839 Thrombocytosis, unspecified: Secondary | ICD-10-CM | POA: Diagnosis present

## 2024-01-10 DIAGNOSIS — F4310 Post-traumatic stress disorder, unspecified: Secondary | ICD-10-CM | POA: Diagnosis present

## 2024-01-10 DIAGNOSIS — Z8249 Family history of ischemic heart disease and other diseases of the circulatory system: Secondary | ICD-10-CM | POA: Diagnosis not present

## 2024-01-10 DIAGNOSIS — Z833 Family history of diabetes mellitus: Secondary | ICD-10-CM | POA: Diagnosis not present

## 2024-01-10 DIAGNOSIS — F1210 Cannabis abuse, uncomplicated: Secondary | ICD-10-CM | POA: Diagnosis present

## 2024-01-10 DIAGNOSIS — G43909 Migraine, unspecified, not intractable, without status migrainosus: Secondary | ICD-10-CM | POA: Diagnosis present

## 2024-01-10 DIAGNOSIS — R112 Nausea with vomiting, unspecified: Secondary | ICD-10-CM | POA: Diagnosis not present

## 2024-01-10 DIAGNOSIS — J449 Chronic obstructive pulmonary disease, unspecified: Secondary | ICD-10-CM | POA: Diagnosis present

## 2024-01-10 DIAGNOSIS — E1140 Type 2 diabetes mellitus with diabetic neuropathy, unspecified: Secondary | ICD-10-CM | POA: Diagnosis present

## 2024-01-10 DIAGNOSIS — G8929 Other chronic pain: Secondary | ICD-10-CM | POA: Diagnosis present

## 2024-01-10 DIAGNOSIS — F410 Panic disorder [episodic paroxysmal anxiety] without agoraphobia: Secondary | ICD-10-CM

## 2024-01-10 DIAGNOSIS — F32A Depression, unspecified: Secondary | ICD-10-CM | POA: Diagnosis present

## 2024-01-10 DIAGNOSIS — F411 Generalized anxiety disorder: Secondary | ICD-10-CM

## 2024-01-10 DIAGNOSIS — K760 Fatty (change of) liver, not elsewhere classified: Secondary | ICD-10-CM | POA: Diagnosis present

## 2024-01-10 DIAGNOSIS — Z7984 Long term (current) use of oral hypoglycemic drugs: Secondary | ICD-10-CM | POA: Diagnosis not present

## 2024-01-10 DIAGNOSIS — Z79899 Other long term (current) drug therapy: Secondary | ICD-10-CM | POA: Diagnosis not present

## 2024-01-10 DIAGNOSIS — N390 Urinary tract infection, site not specified: Secondary | ICD-10-CM | POA: Diagnosis present

## 2024-01-10 DIAGNOSIS — F419 Anxiety disorder, unspecified: Secondary | ICD-10-CM | POA: Diagnosis present

## 2024-01-10 DIAGNOSIS — N200 Calculus of kidney: Secondary | ICD-10-CM | POA: Diagnosis present

## 2024-01-10 DIAGNOSIS — Z888 Allergy status to other drugs, medicaments and biological substances status: Secondary | ICD-10-CM | POA: Diagnosis not present

## 2024-01-10 DIAGNOSIS — F17210 Nicotine dependence, cigarettes, uncomplicated: Secondary | ICD-10-CM | POA: Diagnosis present

## 2024-01-10 DIAGNOSIS — D72828 Other elevated white blood cell count: Secondary | ICD-10-CM | POA: Diagnosis present

## 2024-01-10 DIAGNOSIS — Z9049 Acquired absence of other specified parts of digestive tract: Secondary | ICD-10-CM | POA: Diagnosis not present

## 2024-01-10 DIAGNOSIS — R600 Localized edema: Secondary | ICD-10-CM | POA: Diagnosis present

## 2024-01-10 LAB — CBC
HCT: 35.8 % — ABNORMAL LOW (ref 36.0–46.0)
Hemoglobin: 11.6 g/dL — ABNORMAL LOW (ref 12.0–15.0)
MCH: 27.4 pg (ref 26.0–34.0)
MCHC: 32.4 g/dL (ref 30.0–36.0)
MCV: 84.4 fL (ref 80.0–100.0)
Platelets: 342 K/uL (ref 150–400)
RBC: 4.24 MIL/uL (ref 3.87–5.11)
RDW: 16.2 % — ABNORMAL HIGH (ref 11.5–15.5)
WBC: 13.8 K/uL — ABNORMAL HIGH (ref 4.0–10.5)
nRBC: 0 % (ref 0.0–0.2)

## 2024-01-10 LAB — COMPREHENSIVE METABOLIC PANEL WITH GFR
ALT: 40 U/L (ref 0–44)
AST: 38 U/L (ref 15–41)
Albumin: 3.5 g/dL (ref 3.5–5.0)
Alkaline Phosphatase: 63 U/L (ref 38–126)
Anion gap: 11 (ref 5–15)
BUN: 9 mg/dL (ref 6–20)
CO2: 23 mmol/L (ref 22–32)
Calcium: 8.6 mg/dL — ABNORMAL LOW (ref 8.9–10.3)
Chloride: 107 mmol/L (ref 98–111)
Creatinine, Ser: 0.65 mg/dL (ref 0.44–1.00)
GFR, Estimated: >60 mL/min (ref >60)
Glucose, Bld: 105 mg/dL — ABNORMAL HIGH (ref 70–99)
Potassium: 3.4 mmol/L — ABNORMAL LOW (ref 3.5–5.1)
Sodium: 141 mmol/L (ref 135–145)
Total Bilirubin: 0.5 mg/dL (ref 0.0–1.2)
Total Protein: 6.7 g/dL (ref 6.5–8.1)

## 2024-01-10 LAB — GLUCOSE, CAPILLARY
Glucose-Capillary: 104 mg/dL — ABNORMAL HIGH (ref 70–99)
Glucose-Capillary: 108 mg/dL — ABNORMAL HIGH (ref 70–99)
Glucose-Capillary: 117 mg/dL — ABNORMAL HIGH (ref 70–99)
Glucose-Capillary: 106 mg/dL — ABNORMAL HIGH (ref 70–99)

## 2024-01-10 LAB — URINE CULTURE

## 2024-01-10 NOTE — Progress Notes (Signed)
 PROGRESS NOTE    Natalie Rose  FMW:[id number] DOB: [birth year] DOA: 01/08/2024 PCP: [facility]  Chief Complaint  Patient presents with   Abdominal Pain    Hospital Course:  Natalie Rose with COPD, anxiety, depression, type 2 diabetes, PTSD, migraine disorder, who was recently discharged on 7/26 after undergoing cholecystectomy for acute cholecystitis.  Patient was at home and reports she smoked some marijuana and subsequently began experiencing nausea vomiting.  She presented to the ER for evaluation.  Upon presentation she was hemodynamically stable and labs revealed a leukocytosis of 24.6 with neutrophilia and thrombocytosis.  While in the ED patient reports she began having a panic attack and she took 10 tabs of her home dose Klonopin .  She denies suicidal attempt.  She was admitted for further workup.  Subjective: This morning patient is more alert.  She is requesting to shower.  She denies any nausea, vomiting, abdominal pain. We again discussed her presentation.  She reports that she was feeling anxious and wanted her panic attack to go away which was the impetus for her 10 tabs of Klonopin .  She reports she did not intend to self-harm or injury   Objective: Vitals:   01/09/24 2006 01/10/24 0349 01/10/24 0811 01/10/24 1217  BP: 102/73 (!) 114/92 (!) 136/99 (!) 138/95  Pulse: 80 80 83 63  Resp: 16 18 16 16   Temp: 98.3 F (36.8 C) 98.4 F (36.9 C) 98.4 F (36.9 C) 98.6 F (37 C)  TempSrc:      SpO2: 93% 97% 98% 100%  Weight:      Height:        Intake/Output Summary (Last 24 hours) at 01/10/2024 1314 Last data filed at 01/10/2024 9273 Gross per 24 hour  Intake 600 ml  Output --  Net 600 ml   Filed Weights   01/08/24 2240  Weight: 81.6 kg    Examination: General exam: Appears calm and comfortable, NAD  Respiratory system: No work of breathing, symmetric chest wall expansion Cardiovascular system: S1 & S2 heard, RRR.  Gastrointestinal  system: Abdomen is nondistended, soft appropriately tender to palpation around prior surgical sites which are well-healing Neuro: Drowsy but easily arousable, oriented x 3 Extremities: Symmetric, expected ROM Skin: No rashes, lesions Psychiatry: Demonstrates appropriate judgement and insight. Mood & affect appropriate for situation.   Assessment & Plan:  Principal Problem:   Intractable nausea and vomiting Active Problems:   Acute lower UTI   Anxiety and depression   Benzodiazepine overdose   Type 2 diabetes mellitus without complications (HCC)    Benzodiazepine overdose - Patient reportedly took 10 tabs of 0.5 Klonopin  in ED.  She reports this was not a suicide attempt but instead a response to overwhelming anxiety and panic attack. - Psychiatry has been consulted, will likely discharge to inpatient psych - Patient also follows closely with psychiatric team outpatient.  Depression Anxiety PTSD - Holding home meds for now.  Further management per psychiatry.  Right arm swelling - May be positional perhaps complicated by IV infiltration and Ace wrap placement.  Doppler ordered.  IV removed.  Instructed patient to elevate arm  Nausea and vomiting - Differential is broad.  Patient does report smoking marijuana immediately prior to this which may have induced this event - CT on arrival without acute abnormality.  Status post cholecystectomy  Leukocytosis, chronic - Patient has chronic leukocytosis which she has previously seen hematology for.  Acute worsening this admission may have been reactive due to UTI -  Peripheral smear with unremarkable morphologies. - Will complete ceftriaxone  x 3 days.  Urine culture currently growing multiple species.  Recollection unlikely to be helpful after 48 hours of antibiotic.  Do not anticipate WBC will completely resolve to normal given her persistent and chronic leukocytosis outpatient  Recent cholecystectomy - No acute concerns. Was seen by  general surgery.  No need for surgical intervention at this time.  CMP within normal limits.  No acute abnormality on CT  Type 2 diabetes mellitus  -- Hemoglobin A1c 6.7% - Hold home dose metformin  for now - Continue sliding scale insulin , titrate as needed.   DVT prophylaxis: Lovenox    Code Status: Full Code Disposition: Inpatient.  Can transition to inpatient psych likely within next 24 hours  Consultants:    Procedures:    Antimicrobials:  Anti-infectives (From admission, onward)    Start     Dose/Rate Route Frequency Ordered Stop   01/09/24 2200  cefTRIAXone  (ROCEPHIN ) 2 g in sodium chloride  0.9 % 100 mL IVPB        2 g 200 mL/hr over 30 Minutes Intravenous Every 24 hours 01/09/24 0555     01/09/24 0030  cefTRIAXone  (ROCEPHIN ) 2 g in sodium chloride  0.9 % 100 mL IVPB        2 g 200 mL/hr over 30 Minutes Intravenous  Once 01/09/24 0016 01/09/24 0127       Data Reviewed: I have personally reviewed following labs and imaging studies CBC: Recent Labs  Lab 01/08/24 2252 01/09/24 0303 01/09/24 1335 01/10/24 0831  WBC 24.6* 16.2* 18.2* 13.8*  NEUTROABS 17.5*  --  15.4*  --   HGB 13.4 13.3 13.5 11.6*  HCT 40.1 40.2 39.9 35.8*  MCV 83.2 83.4 82.6 84.4  PLT 417* 362 384 342   Basic Metabolic Panel: Recent Labs  Lab 01/08/24 2252 01/09/24 0303 01/10/24 0831  NA 138 138 141  K 4.2 4.5 3.4*  CL 106 105 107  CO2 21* 23 23  GLUCOSE 170* 160* 105*  BUN 15 13 9   CREATININE 0.84 0.76 0.65  CALCIUM 9.6 8.8* 8.6*  MG 2.1  --   --    GFR: Estimated Creatinine Clearance: 86.5 mL/min (by C-G formula based on SCr of 0.65 mg/dL). Liver Function Tests: Recent Labs  Lab 01/08/24 2252 01/09/24 0303 01/10/24 0831  AST 36 33 38  ALT 37 35 40  ALKPHOS 81 76 63  BILITOT 0.4 0.3 0.5  PROT 8.6* 7.8 6.7  ALBUMIN 4.6 4.0 3.5   CBG: Recent Labs  Lab 01/09/24 1225 01/09/24 1607 01/09/24 2103 01/10/24 0742 01/10/24 1146  GLUCAP 146* 132* 119* 104* 108*    Recent  Results (from the past 240 hours)  Urine Culture     Status: Abnormal   Collection Time: 01/08/24 10:59 PM   Specimen: Urine, Clean Catch  Result Value Ref Range Status   Specimen Description   Final    URINE, CLEAN CATCH Performed at [facility], [address], [place], [place] [address]    Special Requests   Final    NONE Performed at [facility], [address] [place], [place] [address]    Culture MULTIPLE SPECIES PRESENT, SUGGEST RECOLLECTION (A)  Final   Report Status 01/10/2024 FINAL  Final     Radiology Studies: DG Chest Portable 1 View Result Date: 01/09/2024 CLINICAL DATA:  Postop cholecystectomy.  Shortness of breath EXAM: PORTABLE CHEST 1 VIEW COMPARISON:  01/11/2023 FINDINGS: Stable cardiomediastinal silhouette. No focal consolidation, pleural effusion,  or pneumothorax. No displaced rib fractures. IMPRESSION: No active disease. Electronically Signed   By: Norman Gatlin M.D.   On: 01/09/2024 00:12   CT ABDOMEN PELVIS W CONTRAST Result Date: 01/09/2024 CLINICAL DATA:  Abdominal pain, post-op Pt reports cholecystectomy last wk; c/o N/V and upper abd pain tonight EXAM: CT ABDOMEN AND PELVIS WITH CONTRAST TECHNIQUE: Multidetector CT imaging of the abdomen and pelvis was performed using the standard protocol following bolus administration of intravenous contrast. RADIATION DOSE REDUCTION: This exam was performed according to the departmental dose-optimization program which includes automated exposure control, adjustment of the mA and/or kV according to patient size and/or use of iterative reconstruction technique. CONTRAST:  OMNIPAQUE  IOHEXOL  300 MG/ML  SOLN COMPARISON:  CT abdomen pelvis 01/11/2023 FINDINGS: Lower chest: Small hiatal hernia.  No acute abnormality. Hepatobiliary: Borderline enlarged liver measuring up to 18.5 cm. The hepatic parenchyma is diffusely hypodense compared to the splenic parenchyma consistent with fatty infiltration. No focal  liver abnormality. Status post cholecystectomy. No biliary dilatation. Pancreas: No focal lesion. Normal pancreatic contour. No surrounding inflammatory changes. No main pancreatic ductal dilatation. Spleen: Normal in size without focal abnormality. Adrenals/Urinary Tract: No adrenal nodule bilaterally. Bilateral kidneys enhance symmetrically. No hydronephrosis. No hydroureter. Left nephrolithiasis measuring up to 7 mm. No right nephrolithiasis. No ureterolithiasis bilaterally. The urinary bladder is decompressed. On delayed imaging, there is no urothelial wall thickening and there are no filling defects in the opacified portions of the bilateral collecting systems or ureters. Stomach/Bowel: Stomach is within normal limits. No evidence of bowel wall thickening or dilatation. Appendix appears normal. Vascular/Lymphatic: No abdominal aorta or iliac aneurysm. No abdominal, pelvic, or inguinal lymphadenopathy. Reproductive: Uterus and bilateral adnexa are unremarkable. Other: No intraperitoneal free fluid. No intraperitoneal free gas. No organized fluid collection. Musculoskeletal: No abdominal wall hernia or abnormality. No suspicious lytic or blastic osseous lesions. No acute displaced fracture. IMPRESSION: 1. Nonobstructive 7 mm left nephrolithiasis. 2. Small hiatal hernia. 3. Hepatomegaly and hepatic steatosis. Electronically Signed   By: Morgane  Naveau M.D.   On: 01/09/2024 00:01    Scheduled Meds:  enoxaparin  (LOVENOX ) injection  40 mg Subcutaneous Q24H   insulin  aspart  0-15 Units Subcutaneous TID WC   insulin  aspart  0-5 Units Subcutaneous QHS   Continuous Infusions:  cefTRIAXone  (ROCEPHIN )  IV Stopped (01/09/24 2144)     LOS: 0 days  MDM: Patient is high risk for one or more organ failure.  They necessitate ongoing hospitalization for continued IV therapies and subsequent lab monitoring. Total time spent interpreting labs and vitals, reviewing the medical record, coordinating care amongst  consultants and care team members, directly assessing and discussing care with the patient and/or family: 55 min  Anacristina Steffek, DO [facility]  To contact the attending physician between 7A-7P please use Epic Chat. To contact the covering physician during after hours 7P-7A, please review Amion.  01/10/2024, 1:14 PM   *This document has been created with the assistance of dictation software. Please excuse typographical errors. *

## 2024-01-10 NOTE — Plan of Care (Signed)

## 2024-01-11 ENCOUNTER — Other Ambulatory Visit: Payer: Self-pay

## 2024-01-11 ENCOUNTER — Encounter: Payer: Self-pay | Admitting: Psychiatry

## 2024-01-11 ENCOUNTER — Inpatient Hospital Stay
Admission: RE | Admit: 2024-01-11 | Discharge: 2024-01-16 | DRG: 880 | Disposition: A | Discharge status: Home / Self Care | Payer: Self-pay | Source: Ambulatory Visit | Attending: Psychiatry | Admitting: Psychiatry

## 2024-01-11 DIAGNOSIS — Z9049 Acquired absence of other specified parts of digestive tract: Secondary | ICD-10-CM | POA: Diagnosis not present

## 2024-01-11 DIAGNOSIS — Z9151 Personal history of suicidal behavior: Secondary | ICD-10-CM | POA: Diagnosis not present

## 2024-01-11 DIAGNOSIS — G8929 Other chronic pain: Secondary | ICD-10-CM | POA: Diagnosis present

## 2024-01-11 DIAGNOSIS — Z7984 Long term (current) use of oral hypoglycemic drugs: Secondary | ICD-10-CM

## 2024-01-11 DIAGNOSIS — E119 Type 2 diabetes mellitus without complications: Secondary | ICD-10-CM | POA: Diagnosis present

## 2024-01-11 DIAGNOSIS — F332 Major depressive disorder, recurrent severe without psychotic features: Secondary | ICD-10-CM | POA: Diagnosis present

## 2024-01-11 DIAGNOSIS — J449 Chronic obstructive pulmonary disease, unspecified: Secondary | ICD-10-CM | POA: Diagnosis present

## 2024-01-11 DIAGNOSIS — Z5986 Financial insecurity: Secondary | ICD-10-CM | POA: Diagnosis not present

## 2024-01-11 DIAGNOSIS — Z818 Family history of other mental and behavioral disorders: Secondary | ICD-10-CM | POA: Diagnosis not present

## 2024-01-11 DIAGNOSIS — Z833 Family history of diabetes mellitus: Secondary | ICD-10-CM | POA: Diagnosis not present

## 2024-01-11 DIAGNOSIS — Z8249 Family history of ischemic heart disease and other diseases of the circulatory system: Secondary | ICD-10-CM

## 2024-01-11 DIAGNOSIS — Z888 Allergy status to other drugs, medicaments and biological substances status: Secondary | ICD-10-CM | POA: Diagnosis not present

## 2024-01-11 DIAGNOSIS — F411 Generalized anxiety disorder: Principal | ICD-10-CM | POA: Diagnosis present

## 2024-01-11 DIAGNOSIS — F17210 Nicotine dependence, cigarettes, uncomplicated: Secondary | ICD-10-CM | POA: Diagnosis present

## 2024-01-11 DIAGNOSIS — F1210 Cannabis abuse, uncomplicated: Secondary | ICD-10-CM | POA: Diagnosis present

## 2024-01-11 DIAGNOSIS — F410 Panic disorder [episodic paroxysmal anxiety] without agoraphobia: Principal | ICD-10-CM | POA: Diagnosis present

## 2024-01-11 DIAGNOSIS — F4310 Post-traumatic stress disorder, unspecified: Secondary | ICD-10-CM | POA: Diagnosis present

## 2024-01-11 DIAGNOSIS — R112 Nausea with vomiting, unspecified: Secondary | ICD-10-CM | POA: Diagnosis not present

## 2024-01-11 LAB — COMPREHENSIVE METABOLIC PANEL WITH GFR
ALT: 46 U/L — ABNORMAL HIGH (ref 0–44)
AST: 37 U/L (ref 15–41)
Albumin: 3.8 g/dL (ref 3.5–5.0)
Alkaline Phosphatase: 67 U/L (ref 38–126)
Anion gap: 10 (ref 5–15)
BUN: 11 mg/dL (ref 6–20)
CO2: 25 mmol/L (ref 22–32)
Calcium: 9.1 mg/dL (ref 8.9–10.3)
Chloride: 105 mmol/L (ref 98–111)
Creatinine, Ser: 0.74 mg/dL (ref 0.44–1.00)
GFR, Estimated: >60 mL/min (ref >60)
Glucose, Bld: 125 mg/dL — ABNORMAL HIGH (ref 70–99)
Potassium: 3.5 mmol/L (ref 3.5–5.1)
Sodium: 140 mmol/L (ref 135–145)
Total Bilirubin: 0.7 mg/dL (ref 0.0–1.2)
Total Protein: 7.4 g/dL (ref 6.5–8.1)

## 2024-01-11 LAB — CBC WITH DIFFERENTIAL/PLATELET
Abs Immature Granulocytes: 0.05 K/uL (ref 0.00–0.07)
Basophils Absolute: 0 K/uL (ref 0.0–0.1)
Basophils Relative: 0 %
Eosinophils Absolute: 0.3 K/uL (ref 0.0–0.5)
Eosinophils Relative: 3 %
HCT: 39.3 % (ref 36.0–46.0)
Hemoglobin: 12.7 g/dL (ref 12.0–15.0)
Immature Granulocytes: 1 %
Lymphocytes Relative: 36 %
Lymphs Abs: 3.5 K/uL (ref 0.7–4.0)
MCH: 27.4 pg (ref 26.0–34.0)
MCHC: 32.3 g/dL (ref 30.0–36.0)
MCV: 84.9 fL (ref 80.0–100.0)
Monocytes Absolute: 0.8 K/uL (ref 0.1–1.0)
Monocytes Relative: 8 %
Neutro Abs: 5.1 K/uL (ref 1.7–7.7)
Neutrophils Relative %: 52 %
Platelets: 354 K/uL (ref 150–400)
RBC: 4.63 MIL/uL (ref 3.87–5.11)
RDW: 15.9 % — ABNORMAL HIGH (ref 11.5–15.5)
WBC: 9.7 K/uL (ref 4.0–10.5)
nRBC: 0 % (ref 0.0–0.2)

## 2024-01-11 LAB — GLUCOSE, CAPILLARY
Glucose-Capillary: 128 mg/dL — ABNORMAL HIGH (ref 70–99)
Glucose-Capillary: 95 mg/dL (ref 70–99)
Glucose-Capillary: 96 mg/dL (ref 70–99)
Glucose-Capillary: 144 mg/dL — ABNORMAL HIGH (ref 70–99)

## 2024-01-11 MED ORDER — INSULIN ASPART 100 UNIT/ML IJ SOLN: 0.0000 [IU] | Freq: Every day | INTRAMUSCULAR | Status: DC

## 2024-01-11 MED ORDER — NICOTINE POLACRILEX 2 MG MT GUM: 2.0000 mg | CHEWING_GUM | OROMUCOSAL | Status: DC | PRN

## 2024-01-11 MED ORDER — TRAZODONE HCL 50 MG PO TABS
25.0000 mg | ORAL_TABLET | Freq: Every evening | ORAL | Status: DC | PRN
  Administered 2024-01-11 – 2024-01-15 (×8): 25 mg via ORAL
  Filled 2024-01-11 (×5): qty 1

## 2024-01-11 MED ORDER — CEFTRIAXONE SODIUM 1 G IJ SOLR
1.0000 g | Freq: Once | INTRAMUSCULAR | Status: AC
  Administered 2024-01-11: 1 g via INTRAMUSCULAR
  Filled 2024-01-11: qty 10

## 2024-01-11 MED ORDER — MAGNESIUM HYDROXIDE 400 MG/5ML PO SUSP: 30.0000 mL | Freq: Every day | ORAL | Status: DC | PRN

## 2024-01-11 MED ORDER — STERILE WATER FOR INJECTION IJ SOLN
INTRAMUSCULAR | Status: AC
  Filled 2024-01-11: qty 10

## 2024-01-11 MED ORDER — INSULIN ASPART 100 UNIT/ML IJ SOLN
0.0000 [IU] | Freq: Three times a day (TID) | INTRAMUSCULAR | Status: DC
  Administered 2024-01-12 – 2024-01-16 (×14): 2 [IU] via SUBCUTANEOUS
  Filled 2024-01-11 (×7): qty 1

## 2024-01-11 MED ORDER — ONDANSETRON HCL 4 MG PO TABS: 4.0000 mg | ORAL_TABLET | Freq: Four times a day (QID) | ORAL | Status: DC | PRN

## 2024-01-11 MED ORDER — ACETAMINOPHEN 650 MG RE SUPP: 650.0000 mg | Freq: Four times a day (QID) | RECTAL | Status: DC | PRN

## 2024-01-11 MED ORDER — ONDANSETRON HCL 4 MG/2ML IJ SOLN: 4.0000 mg | Freq: Four times a day (QID) | INTRAMUSCULAR | Status: DC | PRN

## 2024-01-11 MED ORDER — ACETAMINOPHEN 325 MG PO TABS: 650.0000 mg | ORAL_TABLET | Freq: Four times a day (QID) | ORAL | Status: DC | PRN

## 2024-01-11 MED ORDER — NICOTINE 14 MG/24HR TD PT24
14.0000 mg | MEDICATED_PATCH | Freq: Every day | TRANSDERMAL | Status: DC
  Administered 2024-01-12: 14 mg via TRANSDERMAL
  Filled 2024-01-11 (×2): qty 1

## 2024-01-11 NOTE — Progress Notes (Signed)
   01/11/24 1945  Charting Type  Charting Type Admission  Focused Reassessment No Changes Psychosocial;Integumentary  Focused Reassessment Changes Noted Psychosocial;Integumentary  Safety Check Verification  Has the RN verified the 15 minute safety check completion? Yes  Neurological  Neuro (WDL) X  Orientation Level Oriented X4  Cognition Appropriate at baseline;Other (Comment) (delayed response)  Speech Clear;Delayed responses  Neuro Symptoms Anxiety;Depression;Other (Comment) (crying)  Neuro symptoms relieved by Other (Comment) (food and talking with her mom on phone)  Glasgow Coma Scale  Eye Opening 4  Best Verbal Response (NON-intubated) 5  Best Motor Response 6  Glasgow Coma Scale Score 15  HEENT  HEENT (WDL) WDL  Vision Check No  Lips Symmetrical;Pink  Teeth Missing (Comment) (upper and some lower)  Tongue Pink;Moist  Mucous Membrane(s) Moist;Pink  Voice Clear  Respiratory  Respiratory Pattern Regular;Unlabored  Chest Assessment Chest expansion symmetrical  Respiratory (WDL) X (Hx COPD)  Cardiac  Cardiac (WDL) WDL  Pulse Regular  Vascular  Vascular (WDL) WDL  Integumentary  Integumentary (WDL) X  Staff Member Assisting with Skin Assessment on Admission [place] MHT  Skin Color Appropriate for ethnicity  Skin Condition Dry  Skin Integrity Other (Comment) (closed surgical incisions from lap chole in July 2025)  Skin Turgor Non-tenting  Braden Scale (Ages 8 and up)  Sensory Perceptions 4  Moisture 4  Activity 4  Mobility 4  Nutrition 3  Friction and Shear 3  Braden Scale Score 22  Musculoskeletal  Musculoskeletal (WDL) X  Assistive Device None  Generalized Weakness Yes  Gastrointestinal  Gastrointestinal (WDL) WDL  Abdomen Inspection Other (Comment) (July 2025 lap chole)  Last BM Date  01/11/24  Neurological  Level of Consciousness Alert

## 2024-01-11 NOTE — Plan of Care (Signed)
 Problem: Education: Goal: Knowledge of [facility] Education information/materials will improve Outcome: Not Progressing Goal: Emotional status will improve Outcome: Not Progressing Goal: Mental status will improve Outcome: Not Progressing Goal: Verbalization of understanding the information provided will improve Outcome: Not Progressing   Problem: Activity: Goal: Interest or engagement in activities will improve Outcome: Not Progressing Goal: Sleeping patterns will improve Outcome: Not Progressing   Problem: Coping: Goal: Ability to verbalize frustrations and anger appropriately will improve Outcome: Not Progressing Goal: Ability to demonstrate self-control will improve Outcome: Not Progressing   Problem: Health Behavior/Discharge Planning: Goal: Identification of resources available to assist in meeting health care needs will improve Outcome: Not Progressing Goal: Compliance with treatment plan for underlying cause of condition will improve Outcome: Not Progressing   Problem: Physical Regulation: Goal: Ability to maintain clinical measurements within normal limits will improve Outcome: Not Progressing   Problem: Safety: Goal: Periods of time without injury will increase Outcome: Not Progressing   Problem: Education: Goal: Ability to state activities that reduce stress will improve Outcome: Not Progressing   Problem: Coping: Goal: Ability to identify and develop effective coping behavior will improve Outcome: Not Progressing   Problem: Self-Concept: Goal: Ability to identify factors that promote anxiety will improve Outcome: Not Progressing Goal: Level of anxiety will decrease Outcome: Not Progressing Goal: Ability to modify response to factors that promote anxiety will improve Outcome: Not Progressing   Problem: Education: Goal: Utilization of techniques to improve thought processes will improve Outcome: Not Progressing Goal: Knowledge of the  prescribed therapeutic regimen will improve Outcome: Not Progressing   Problem: Activity: Goal: Interest or engagement in leisure activities will improve Outcome: Not Progressing Goal: Imbalance in normal sleep/wake cycle will improve Outcome: Not Progressing   Problem: Coping: Goal: Coping ability will improve Outcome: Not Progressing Goal: Will verbalize feelings Outcome: Not Progressing   Problem: Health Behavior/Discharge Planning: Goal: Ability to make decisions will improve Outcome: Not Progressing Goal: Compliance with therapeutic regimen will improve Outcome: Not Progressing   Problem: Role Relationship: Goal: Will demonstrate positive changes in social behaviors and relationships Outcome: Not Progressing   Problem: Safety: Goal: Ability to disclose and discuss suicidal ideas will improve Outcome: Not Progressing Goal: Ability to identify and utilize support systems that promote safety will improve Outcome: Not Progressing   Problem: Self-Concept: Goal: Will verbalize positive feelings about self Outcome: Not Progressing Goal: Level of anxiety will decrease Outcome: Not Progressing   Problem: Education: Goal: Knowledge of [facility] Education information/materials will improve Outcome: Not Progressing Goal: Emotional status will improve Outcome: Not Progressing Goal: Mental status will improve Outcome: Not Progressing Goal: Verbalization of understanding the information provided will improve Outcome: Not Progressing   Problem: Activity: Goal: Interest or engagement in activities will improve Outcome: Not Progressing Goal: Sleeping patterns will improve Outcome: Not Progressing   Problem: Coping: Goal: Ability to verbalize frustrations and anger appropriately will improve Outcome: Not Progressing Goal: Ability to demonstrate self-control will improve Outcome: Not Progressing   Problem: Health Behavior/Discharge Planning: Goal: Identification  of resources available to assist in meeting health care needs will improve Outcome: Not Progressing Goal: Compliance with treatment plan for underlying cause of condition will improve Outcome: Not Progressing   Problem: Physical Regulation: Goal: Ability to maintain clinical measurements within normal limits will improve Outcome: Not Progressing   Problem: Safety: Goal: Periods of time without injury will increase Outcome: Not Progressing   Problem: Education: Goal: Ability to make informed decisions regarding treatment will improve Outcome:  Not Progressing   Problem: Coping: Goal: Coping ability will improve Outcome: Not Progressing   Problem: Health Behavior/Discharge Planning: Goal: Identification of resources available to assist in meeting health care needs will improve Outcome: Not Progressing   Problem: Medication: Goal: Compliance with prescribed medication regimen will improve Outcome: Not Progressing   Problem: Self-Concept: Goal: Ability to disclose and discuss suicidal ideas will improve Outcome: Not Progressing Goal: Will verbalize positive feelings about self Outcome: Not Progressing Note: Patient is not on track and no change. Patient will work on increased adherence, avoid flare triggers, and be monitored by provider to determine if a change in treatment plan is warranted

## 2024-01-11 NOTE — Discharge Summary (Signed)
 DISCHARGE SUMMARY    Natalie Rose FMW:[id number] DOB: [birth year] DOA: 01/08/2024  PCP: [facility]  Admit date: 01/08/2024 Discharge date: 01/11/2024   Recommendations for Outpatient Follow-up:  Discharge directly to [facility] - psychiatric team to assume primary medical management    Hospital Course: Natalie Rose with COPD, anxiety, depression, type 2 diabetes, PTSD, migraine disorder, who was recently discharged on 7/26 after undergoing cholecystectomy for acute cholecystitis.  Patient was at home and reports she smoked some marijuana and subsequently began experiencing nausea vomiting.  She presented to the ER for evaluation.  Upon presentation she was hemodynamically stable and labs revealed a leukocytosis of 24.6 with neutrophilia and thrombocytosis.  While in the ED patient reports she began having a panic attack and she took 10 tabs of her home dose Klonopin . She was admitted for further workup. Psychiatry team was consulted and determined the patient required IVC and discharge to inpatient psychiatric care. Medically, patients labs and symptoms resolved and she is stable for discharge into psychiatric care.  Medical recommendations as below:  Benzodiazepine overdose - Patient reportedly took 10 tabs of 0.5 Klonopin  in ED.  - Patient also follows closely with psychiatric team outpatient. -- There is ongoing concern for intent to self harm and possible substance abuse (marijuana). Patient has been IVC'd by psychiatry team and will discharge directly into their care at [facility].   Depression Anxiety PTSD - Holding home meds for now.  Medication management per psychiatry.  Substance Abuse -- Pt endorses marijuana abuse. UDS + for cannabis and tricyclics. Of note, negative for benzo which pt is prescribed  --Has been counseled on cessation   Right arm swelling - Likely positional.  Has improved now without intervention - Doppler negative for DVT   Nausea  and vomiting - Differential is broad.  Patient does report smoking marijuana immediately prior to this which may have induced this event. It does seem patient has some degree of cannabis hyperemesis syndrome. - CT on arrival without acute abnormality.  Status post cholecystectomy - No evidence of nausea or vomiting since admission -- If this recurs, please trial zofran  or reglan. Can consider haldol  for refractory case.  Leukocytosis, chronic - Patient has chronic leukocytosis which she has previously seen hematology for.  Acute worsening this admission may have been reactive due to UTI - Peripheral smear with unremarkable morphologies. - Will complete ceftriaxone  x 3 days today.  Urine culture currently growing multiple species.  Recollection unlikely to be helpful after 48 hours of antibiotic. - WBC appears to have resolved entirely now.    Recent cholecystectomy - No acute concerns. Was seen by general surgery.  No need for surgical intervention at this time.  CMP within normal limits.  No acute abnormality on CT   Type 2 diabetes mellitus  -- Hemoglobin A1c 6.7% - Resume metformin  at home. No acute need for insulin     Please feel free to consult the TR[facility] service as needed.   Discharge Instructions  Discharge Instructions     Call MD for:  difficulty breathing, headache or visual disturbances   Complete by: As directed    Call MD for:  persistant dizziness or light-headedness   Complete by: As directed    Call MD for:  persistant nausea and vomiting   Complete by: As directed    Call MD for:  severe uncontrolled pain   Complete by: As directed    Call MD for:  temperature >100.4   Complete by: As directed  Diet general   Complete by: As directed    Discharge instructions   Complete by: As directed    Follow up with your primary care physician to discuss the medication changes during this admission. Please refer to psychiatry team for all psychiatric medications    Increase activity slowly   Complete by: As directed       Allergies as of 01/11/2024       Reactions   Diazepam  Hives, Anxiety, Itching, Other (See Comments), Rash   unknown unknown   Lurasidone         Medication List     STOP taking these medications    acetaminophen  500 MG tablet Commonly known as: TYLENOL    clonazePAM  0.5 MG tablet Commonly known as: KLONOPIN    mirtazapine  45 MG tablet Commonly known as: REMERON    ondansetron  4 MG tablet Commonly known as: ZOFRAN    prazosin  2 MG capsule Commonly known as: MINIPRESS    prazosin  5 MG capsule Commonly known as: MINIPRESS    propranolol  ER 60 MG 24 hr capsule Commonly known as: INDERAL  LA   QUEtiapine  50 MG tablet Commonly known as: SEROQUEL    sertraline  50 MG tablet Commonly known as: ZOLOFT        TAKE these medications    AeroChamber MV inhaler Use as instructed   albuterol  108 (90 Base) MCG/ACT inhaler Commonly known as: VENTOLIN  HFA Inhale 2 puffs into the lungs every 4 (four) hours as needed.   atenolol  50 MG tablet Commonly known as: TENORMIN  Take 1 tablet (50 mg total) by mouth daily.   gabapentin  300 MG capsule Commonly known as: NEURONTIN  Take 1 capsule (300 mg total) by mouth 3 (three) times daily.   Lybalvi  10-10 MG Tabs Generic drug: OLANZapine -Samidorphan Take 1 tablet by mouth daily.   metFORMIN  500 MG tablet Commonly known as: GLUCOPHAGE  Take 1 tablet (500 mg total) by mouth 2 (two) times daily with a meal.   sucralfate  1 g tablet Commonly known as: CARAFATE  Take 1 g by mouth 4 (four) times daily.        Allergies  Allergen Reactions   Diazepam  Hives, Anxiety, Itching, Other (See Comments) and Rash    unknown unknown    Lurasidone     Consultations:    Procedures/Studies: US  Venous Img Upper Uni Right(DVT) Result Date: 01/10/2024 CLINICAL DATA:  Edema 9965.  Forearm edema. EXAM: RIGHT UPPER EXTREMITY VENOUS DOPPLER ULTRASOUND TECHNIQUE: Gray-scale sonography  with graded compression, as well as color Doppler and duplex ultrasound were performed to evaluate the upper extremity deep venous system from the level of the subclavian vein and including the jugular, axillary, basilic, radial, ulnar and upper cephalic vein. Spectral Doppler was utilized to evaluate flow at rest and with distal augmentation maneuvers. COMPARISON:  None Available. FINDINGS: Contralateral Subclavian Vein: Respiratory phasicity is normal and symmetric with the symptomatic side. No evidence of thrombus. Normal compressibility. Internal Jugular Vein: No evidence of thrombus. Normal compressibility, respiratory phasicity and color Doppler flow. Subclavian Vein: No evidence of thrombus. Normal color Doppler flow and phasicity. Axillary Vein: No evidence of thrombus. Normal compressibility, respiratory phasicity and response to augmentation. Cephalic Vein: No evidence of thrombus. Normal compressibility, respiratory phasicity and response to augmentation. Basilic Vein: No evidence of thrombus. Normal compressibility, respiratory phasicity and response to augmentation. Brachial Veins: No evidence of thrombus. Normal compressibility, respiratory phasicity and response to augmentation. Radial Veins: No evidence of thrombus. Normal compressibility and color Doppler flow. Ulnar Veins: No evidence of thrombus. Normal compressibility and color Doppler  flow. Other Findings:  None visualized. IMPRESSION: Negative for deep venous thrombosis in the right upper extremity. Electronically Signed   By: Juliene Balder M.D.   On: 01/10/2024 16:50   DG Chest Portable 1 View Result Date: 01/09/2024 CLINICAL DATA:  Postop cholecystectomy.  Shortness of breath EXAM: PORTABLE CHEST 1 VIEW COMPARISON:  01/11/2023 FINDINGS: Stable cardiomediastinal silhouette. No focal consolidation, pleural effusion, or pneumothorax. No displaced rib fractures. IMPRESSION: No active disease. Electronically Signed   By: Norman Gatlin M.D.   On:  01/09/2024 00:12   CT ABDOMEN PELVIS W CONTRAST Result Date: 01/09/2024 CLINICAL DATA:  Abdominal pain, post-op Pt reports cholecystectomy last wk; c/o N/V and upper abd pain tonight EXAM: CT ABDOMEN AND PELVIS WITH CONTRAST TECHNIQUE: Multidetector CT imaging of the abdomen and pelvis was performed using the standard protocol following bolus administration of intravenous contrast. RADIATION DOSE REDUCTION: This exam was performed according to the departmental dose-optimization program which includes automated exposure control, adjustment of the mA and/or kV according to patient size and/or use of iterative reconstruction technique. CONTRAST:  OMNIPAQUE  IOHEXOL  300 MG/ML  SOLN COMPARISON:  CT abdomen pelvis 01/11/2023 FINDINGS: Lower chest: Small hiatal hernia.  No acute abnormality. Hepatobiliary: Borderline enlarged liver measuring up to 18.5 cm. The hepatic parenchyma is diffusely hypodense compared to the splenic parenchyma consistent with fatty infiltration. No focal liver abnormality. Status post cholecystectomy. No biliary dilatation. Pancreas: No focal lesion. Normal pancreatic contour. No surrounding inflammatory changes. No main pancreatic ductal dilatation. Spleen: Normal in size without focal abnormality. Adrenals/Urinary Tract: No adrenal nodule bilaterally. Bilateral kidneys enhance symmetrically. No hydronephrosis. No hydroureter. Left nephrolithiasis measuring up to 7 mm. No right nephrolithiasis. No ureterolithiasis bilaterally. The urinary bladder is decompressed. On delayed imaging, there is no urothelial wall thickening and there are no filling defects in the opacified portions of the bilateral collecting systems or ureters. Stomach/Bowel: Stomach is within normal limits. No evidence of bowel wall thickening or dilatation. Appendix appears normal. Vascular/Lymphatic: No abdominal aorta or iliac aneurysm. No abdominal, pelvic, or inguinal lymphadenopathy. Reproductive: Uterus and bilateral  adnexa are unremarkable. Other: No intraperitoneal free fluid. No intraperitoneal free gas. No organized fluid collection. Musculoskeletal: No abdominal wall hernia or abnormality. No suspicious lytic or blastic osseous lesions. No acute displaced fracture. IMPRESSION: 1. Nonobstructive 7 mm left nephrolithiasis. 2. Small hiatal hernia. 3. Hepatomegaly and hepatic steatosis. Electronically Signed   By: Morgane  Naveau M.D.   On: 01/09/2024 00:01   US  ABDOMEN LIMITED RUQ (LIVER/GB) Result Date: 12/28/2023 CLINICAL DATA:  [id number] right upper quadrant pain [id number] EXAM: ULTRASOUND ABDOMEN LIMITED RIGHT UPPER QUADRANT COMPARISON:  CT 01/11/2023 FINDINGS: Gallbladder: Multiple layering gallstones are seen within the gallbladder. The gallbladder, however, is not distended, there is no gallbladder wall thickening, and no pericholecystic fluid is identified. The sonographic Beverley sign is reportedly negative. Common bile duct: Diameter: 3 mm in proximal diameter Liver: Hepatic parenchymal echogenicity is diffusely increased in keeping with mild hepatic steatosis. Mild hepatomegaly. No focal intrahepatic masses are seen and there is no intrahepatic biliary ductal dilation. Portal vein is patent on color Doppler imaging with normal direction of blood flow towards the liver. Other: None. IMPRESSION: 1. Cholelithiasis without sonographic evidence of acute cholecystitis. 2. Mild hepatomegaly and hepatic steatosis. Electronically Signed   By: Dorethia Molt M.D.   On: 12/28/2023 02:43      Discharge Exam: Vitals:   01/11/24 1143 01/11/24 1518  BP: (!) 141/92 131/84  Pulse: 78 (!) 57  Resp: 17 15  Temp: 98.2 F (36.8 C) 98.3 F (36.8 C)  SpO2: 97% 98%   Vitals:   01/11/24 0833 01/11/24 0858 01/11/24 1143 01/11/24 1518  BP: (!) 162/102 (!) 155/88 (!) 141/92 131/84  Pulse: 71  78 (!) 57  Resp: 17  17 15   Temp: 98.5 F (36.9 C)  98.2 F (36.8 C) 98.3 F (36.8 C)  TempSrc:      SpO2: 100%  97% 98%  Weight:       Height:        See progress note same day.  The results of significant diagnostics from this hospitalization (including imaging, microbiology, ancillary and laboratory) are listed below for reference.     Microbiology: Recent Results (from the past 240 hours)  Urine Culture     Status: Abnormal   Collection Time: 01/08/24 10:59 PM   Specimen: Urine, Clean Catch  Result Value Ref Range Status   Specimen Description   Final    URINE, CLEAN CATCH Performed at [facility], [address], [place], [place] [address]    Special Requests   Final    NONE Performed at [facility], [address] [place], [place] [address]    Culture MULTIPLE SPECIES PRESENT, SUGGEST RECOLLECTION (A)  Final   Report Status 01/10/2024 FINAL  Final     Labs: BNP (last 3 results) No results for input(s): BNP in the last 8760 hours. Basic Metabolic Panel: Recent Labs  Lab 01/08/24 2252 01/09/24 0303 01/10/24 0831 01/11/24 0853  NA 138 138 141 140  K 4.2 4.5 3.4* 3.5  CL 106 105 107 105  CO2 21* 23 23 25   GLUCOSE 170* 160* 105* 125*  BUN 15 13 9 11   CREATININE 0.84 0.76 0.65 0.74  CALCIUM 9.6 8.8* 8.6* 9.1  MG 2.1  --   --   --    Liver Function Tests: Recent Labs  Lab 01/08/24 2252 01/09/24 0303 01/10/24 0831 01/11/24 0853  AST 36 33 38 37  ALT 37 35 40 46*  ALKPHOS 81 76 63 67  BILITOT 0.4 0.3 0.5 0.7  PROT 8.6* 7.8 6.7 7.4  ALBUMIN 4.6 4.0 3.5 3.8   Recent Labs  Lab 01/08/24 2252  LIPASE 37   No results for input(s): AMMONIA in the last 168 hours. CBC: Recent Labs  Lab 01/08/24 2252 01/09/24 0303 01/09/24 1335 01/10/24 0831 01/11/24 0853  WBC 24.6* 16.2* 18.2* 13.8* 9.7  NEUTROABS 17.5*  --  15.4*  --  5.1  HGB 13.4 13.3 13.5 11.6* 12.7  HCT 40.1 40.2 39.9 35.8* 39.3  MCV 83.2 83.4 82.6 84.4 84.9  PLT 417* 362 384 342 354   Cardiac Enzymes: No results for input(s): CKTOTAL, CKMB, CKMBINDEX, TROPONINI in the last 168  hours. BNP: Invalid input(s): POCBNP CBG: Recent Labs  Lab 01/10/24 1642 01/10/24 2316 01/11/24 0750 01/11/24 1139 01/11/24 1557  GLUCAP 117* 106* 128* 95 96   D-Dimer No results for input(s): DDIMER in the last 72 hours. Hgb A1c Recent Labs    01/08/24 2252  HGBA1C 6.7*   Lipid Profile No results for input(s): CHOL, HDL, LDLCALC, TRIG, CHOLHDL, LDLDIRECT in the last 72 hours. Thyroid  function studies No results for input(s): TSH, T4TOTAL, T3FREE, THYROIDAB in the last 72 hours.  Invalid input(s): FREET3 Anemia work up No results for input(s): VITAMINB12, FOLATE, FERRITIN, TIBC, IRON, RETICCTPCT in the last 72 hours. Urinalysis    Component Value Date/Time   COLORURINE YELLOW (A) 01/08/2024 2259   APPEARANCEUR HAZY (  A) 01/08/2024 2259   LABSPEC 1.020 01/08/2024 2259   PHURINE 5.0 01/08/2024 2259   GLUCOSEU NEGATIVE 01/08/2024 2259   HGBUR NEGATIVE 01/08/2024 2259   BILIRUBINUR NEGATIVE 01/08/2024 2259   KETONESUR NEGATIVE 01/08/2024 2259   PROTEINUR NEGATIVE 01/08/2024 2259   NITRITE NEGATIVE 01/08/2024 2259   LEUKOCYTESUR SMALL (A) 01/08/2024 2259   Sepsis Labs Recent Labs  Lab 01/09/24 0303 01/09/24 1335 01/10/24 0831 01/11/24 0853  WBC 16.2* 18.2* 13.8* 9.7   Microbiology Recent Results (from the past 240 hours)  Urine Culture     Status: Abnormal   Collection Time: 01/08/24 10:59 PM   Specimen: Urine, Clean Catch  Result Value Ref Range Status   Specimen Description   Final    URINE, CLEAN CATCH Performed at [facility], [address], [place], [place] [address]    Special Requests   Final    NONE Performed at [facility], [address] [place], [place] [address]    Culture MULTIPLE SPECIES PRESENT, SUGGEST RECOLLECTION (A)  Final   Report Status 01/10/2024 FINAL  Final     Time coordinating discharge: 32 min   SIGNED: Layth Cerezo, DO [facility] 01/11/2024, 6:21  PM Pager   If 7PM-7AM, please contact night-coverage

## 2024-01-11 NOTE — Progress Notes (Signed)
   01/11/24 1945  Psych Admission Type (Psych Patients Only)  Admission Status Involuntary  Psychosocial Assessment  Patient Complaints Anxiety;Crying spells;Depression  Eye Contact Avoids  Facial Expression Flat;Sad  Affect Anxious;Fearful;Depressed  Speech Logical/coherent;Soft  Interaction Cautious;Guarded  Motor Activity Slow  Appearance/Hygiene Unremarkable;In scrubs  Behavior Characteristics Cooperative;Appropriate to situation  Mood Depressed;Anxious  Aggressive Behavior  Targets Self  Type of Behavior Other (Comment) (pt now denies)  Effect No apparent injury  Thought Process  Coherency WDL  Content WDL  Delusions WDL  Perception WDL  Hallucination None reported or observed  Judgment Poor  Confusion WDL  Danger to Self  Current suicidal ideation? Denies  Danger to Others  Danger to Others None reported or observed

## 2024-01-11 NOTE — Consult Note (Signed)
 [facility] Consult Follow-up  Patient Name: .Natalie Rose  MRN: [id number]  DOB: 01-18-1981  Consult Order details:  Orders (From admission, onward)     Start     Ordered   01/09/24 0547  IP CONSULT TO PSYCHIATRY       Ordering Provider: Mansy, Jan A, MD  Provider:  (Not yet assigned)  Question Answer Comment  Location [facility]   Reason for Consult? klonopin  OD      01/09/24 0550   01/09/24 0157  IP CONSULT TO PSYCHIATRY       Ordering Provider: Neomi Josette SAILOR, DO  Provider:  (Not yet assigned)  Question Answer Comment  Consult Timeframe STAT - requires a response within one hour   STAT timeframe requires provider to provider communication, has the provider to provider communication been completed Yes   Reason for Consult? SI, IVC   Contact phone number where the requesting provider can be reached [phone]      01/09/24 0157   01/09/24 0156  CONSULT TO CALL ACT TEAM       Ordering Provider: Neomi Josette SAILOR, DO  Provider:  (Not yet assigned)  Question:  Reason for Consult?  Answer:  Psych consult   01/09/24 0157             Mode of Visit: In person    Psychiatry Consult Evaluation  Service Date: January 11, 2024 LOS:  LOS: 1 day  Chief Complaint I was throwing up  Primary Psychiatric Diagnoses  GAD with panic attacks   Assessment  Natalie Rose is a 43 y.o. female admitted: MedicallyAmanda Aubreyana Rose is a 43 y.o. female with medical history significant for COPD, anxiety, depression, type 2 diabetes mellitus, PTSD and migraine, who presented with acute onset of epigastric abdominal pain as well as intractable nausea and vomiting that started today with associated diarrhea.  The patient had a cholecystectomy on 12/28/2023 by Dr. Marinda for acute cholecystitis.  Per chart patient took 8-10 of Klonopin  in the ED.  Psychiatry is consulted as patient was admitted on [facility] and for safety  evaluation  01/11/2024: Patient was seen today for psychiatric reassessment. She reports she was feeling nervous about being here in the hospital.  Patient is requesting when she will be discharged.  She repetitively stated, I do not like being in hospitals.  Patient admits to intentionally taking an excessive amount of Klonopin  0.5 mg tablets and endorses severe depression.  She is observed minimizing the need for acute psychiatric treatment. Patient denies suicidal or homicidal ideation or perceptual disturbances today. Will collect collateral information from patient's mother to assess the need for IVC due to the potential of patient's unwillingness for voluntary admission to the inpatient behavioral health unit.  Patient's mother, [name] provided collateral information validating that patient has attempted to overdose multiple times in the past, struggles from severe depression, and that this most recent episode of consuming 10 Klonopin  tablets was another suicide attempt. Patient will be admitted to the inpatient behavioral health unit this evening.  01/11/2024: On assessment patient consistently denies SI/HI/plan and consistently reports that she took 8-10 of Klonopin  0.5 mg as she was having panic attacks in the emergency room.  She remains future oriented and is willing to participate in outpatient mental health services.  She reports that she is planning to stay close to her mother.  She denies auditory/visual hallucinations.  Will reach out to family for collateral information and safety assessment at  home.  Will follow-up tomorrow and continue to assess the need for IVC after we get collateral information     Diagnoses:  Active Hospital problems: Principal Problem:   Intractable nausea and vomiting Active Problems:   Acute lower UTI   Anxiety and depression   Benzodiazepine overdose   Type 2 diabetes mellitus without complications (HCC)    Plan   ## Psychiatric Medication  Recommendations:  Continue her home medication  ## Medical Decision Making Capacity: Not specifically addressed in this encounter  ## Further Work-up:   No further workup   ## Disposition:-- Patient will be admitted to the inpatient behavioral health unit.   ## Behavioral / Environmental: - No specific recommendations at this time.     ## Safety and Observation Level:  - Based on my clinical evaluation, I estimate the patient to be at low risk of self harm in the current setting. - At this time, we recommend  routine. This decision is based on my review of the chart including patient's history and current presentation, interview of the patient, mental status examination, and consideration of suicide risk including evaluating suicidal ideation, plan, intent, suicidal or self-harm behaviors, risk factors, and protective factors. This judgment is based on our ability to directly address suicide risk, implement suicide prevention strategies, and develop a safety plan while the patient is in the clinical setting. Please contact our team if there is a concern that risk level has changed.  CSSR Risk Category:C-SSRS RISK CATEGORY: No Risk  Suicide Risk Assessment: Patient has following modifiable risk factors for suicide: triggering events, which we are addressing by intensive therapy outpatient. Patient has following non-modifiable or demographic risk factors for suicide: psychiatric hospitalization Patient has the following protective factors against suicide: Supportive family  Thank you for this consult request. Recommendations have been communicated to the primary team.  We will admit to the behavioral health unit at this time.   [name], NP       History of Present Illness  Relevant Aspects of [facility]   Patient Report:  On interview patient is able to answer the orientation questions stating she is at [facility] date as August 2025 president as Trump.  She reports that  she was having intractable vomitings and came to the emergency room.  She talks about having gallbladder surgery recently and since that she lost 10 pounds and is unable to eat.  The vomiting started few days ago.  She reports that in the emergency room she got anxious that she was having panic attack and took 8-10 of Klonopin .  She denies taking such an overdose before.  She reports history of depression but denies hopelessness worthlessness, denies anhedonia.  Has fair energy and motivation, sleeps for 8 hours.  Denies SI/HI/intent/plan.  Denies anxiety but has panic attacks on and off and the last panic attack was in the ED.  She denies auditory/visual hallucinations.  Denies nightmares or flashbacks from any traumatic experience.  She denies recent or current episodes of mania/hypomania.   Collateral information:  01/11/2024: Patient's mother, [name] contacted for collateral information [phone] and had a lengthy conversation regarding patient's past and current mental health. Patient's mother reports that patient has attempted suicide by overdose at least two to three times that she can remember. She relates that patient attempted suicide by overdosing on her medications in February 2025 and was admitted inpatient here at [facility], while in the ED at [facility] waiting to be seen; she overdosed on her  home medications, and again in 2017 she overdosed and was admitted to [facility]. However, she was in ICU for several days prior to being admitted to the behavioral health unit there.   Ms. Leonce reports that patient has called her several times today saying,  help me, help me which means she wants to go home and smoke cannabis and cigarettes fearing that she will be admitted to the inpatient behavioral health unit. She states that patient has had episodes of severe nausea and vomiting related to cannabis use and if she stops smoking for a day, then she becomes very ill. Her mother reports  that patient has been severely depressed and struggling. She states that patient lives approximately 3 miles from her in an apartment and has dogs for support. Her mother that patient also has occasional issues with chronic pain which she seeks pain medication. However, she relates that patient has had an extensive mental health history starting when she was 36 to 43 years old seeing a therapist. Mother states that patient's father has history of paranoid schizophrenia and her mother has anxiety and depression.     Psychiatric and Social History  Psychiatric History:  Information collected from patient  Prev Dx/Sx: Depression and anxiety Current Psych Provider: [facility] Home Meds (current): Klonopin , Zoloft , gabapentin , Remeron , propranolol , prazosin  Previous Med Trials: Unknown Therapy: Currently has a therapist  Prior Psych Hospitalization: February 2025 Prior Self Harm: Many years ago Prior Violence: Denies  Family Psych History: Maternal grandfather died by suicide, mom with anxiety, her dad with alcohol use disorder, paranoid schizophrenia Family Hx suicide: As above  Social History:   Educational Hx: Ninth grade Occupational Hx: On disability Legal Hx: Denies Living Situation: By self Spiritual Hx:SABRA Access to weapons/lethal means: Denies  Substance History Alcohol: Denies  Tobacco: 1 pack/day Illicit drugs: Cannabis, for many years, every day, 1 blunt, last use was yesterday Prescription drug abuse: Denies Rehab hx: Denies  Exam Findings  Physical Exam: Reviewed and agree with the physical exam findings conducted by the ED provider Vital Signs:  Temp:  [97.9 F (36.6 C)-98.5 F (36.9 C)] 98.3 F (36.8 C) (08/09 1518) Pulse Rate:  [57-78] 57 (08/09 1518) Resp:  [15-20] 15 (08/09 1518) BP: (107-162)/(72-102) 131/84 (08/09 1518) SpO2:  [96 %-100 %] 98 % (08/09 1518) Blood pressure 131/84, pulse (!) 57, temperature 98.3 F (36.8 C), resp. rate 15, height 4'  11 (1.499 m), weight 81.6 kg, last menstrual period 12/28/2023, SpO2 98%. Body mass index is 36.36 kg/m.    Mental Status Exam: General Appearance: Casual  Orientation:  Full (Time, Place, and Person)  Memory:  Immediate;   Fair Recent;   Fair Remote;   Fair  Concentration:  Concentration: Fair and Attention Span: Fair  Recall:  Fair  Attention  Fair  Eye Contact:  Fair  Speech:  Clear and Coherent  Language:  Fair  Volume:  Normal  Mood: fine  Affect:  Appropriate  Thought Process:  Coherent  Thought Content:  Logical  Suicidal Thoughts:  No  Homicidal Thoughts:  No  Judgement:  Impaired  Insight:  Shallow  Psychomotor Activity:  Normal  Akathisia:  No  Fund of Knowledge:  Fair      Assets:  Communication Skills Desire for Improvement  Cognition:  WNL  ADL's:  Intact  AIMS (if indicated):        Other History   These have been pulled in through the EMR, reviewed, and updated if appropriate.  Family History:  The patient's family history includes Depression in her mother; Diabetes in her father; Heart failure in her mother; Osteoarthritis in her mother; Osteopenia in her mother.  Medical History: Past Medical History:  Diagnosis Date   Anxiety    Arthritis    COPD (chronic obstructive pulmonary disease) (HCC)    Depression    Diabetes mellitus without complication (HCC)    Mental health disorder    Migraines     Surgical History: Past Surgical History:  Procedure Laterality Date   CHOLECYSTECTOMY     MOUTH SURGERY       Medications:   Current Facility-Administered Medications:    acetaminophen  (TYLENOL ) tablet 650 mg, 650 mg, Oral, Q6H PRN **OR** acetaminophen  (TYLENOL ) suppository 650 mg, 650 mg, Rectal, Q6H PRN, Mansy, Jan A, MD   cefTRIAXone  (ROCEPHIN ) 2 g in sodium chloride  0.9 % 100 mL IVPB, 2 g, Intravenous, Q24H, Mansy, Jan A, MD, Stopped at 01/10/24 2251   enoxaparin  (LOVENOX ) injection 40 mg, 40 mg, Subcutaneous, Q24H, Mansy, Jan A, MD, 40  mg at 01/11/24 9145   insulin  aspart (novoLOG ) injection 0-15 Units, 0-15 Units, Subcutaneous, TID WC, Mansy, Jan A, MD, 2 Units at 01/11/24 9146   insulin  aspart (novoLOG ) injection 0-5 Units, 0-5 Units, Subcutaneous, QHS, Mansy, Jan A, MD   magnesium  hydroxide (MILK OF MAGNESIA) suspension 30 mL, 30 mL, Oral, Daily PRN, Mansy, Jan A, MD   ondansetron  (ZOFRAN ) tablet 4 mg, 4 mg, Oral, Q6H PRN **OR** ondansetron  (ZOFRAN ) injection 4 mg, 4 mg, Intravenous, Q6H PRN, Mansy, Jan A, MD, 4 mg at 01/09/24 1117   traZODone  (DESYREL ) tablet 25 mg, 25 mg, Oral, QHS PRN, Mansy, Jan A, MD, 25 mg at 01/10/24 2214  Allergies: Allergies  Allergen Reactions   Diazepam  Hives, Anxiety, Itching, Other (See Comments) and Rash    unknown unknown    Lurasidone     Johnnie Moten J Breon Rehm, NP

## 2024-01-11 NOTE — Group Note (Deleted)
 Date:  01/11/2024 Time:  9:26 PM  Group Topic/Focus:  Recovery Goals:   The focus of this group is to identify appropriate goals for recovery and establish a plan to achieve them.     Participation Level:  {BHH PARTICIPATION OZCZO:77735}  Participation Quality:  {BHH PARTICIPATION QUALITY:22265}  Affect:  {BHH AFFECT:22266}  Cognitive:  {BHH COGNITIVE:22267}  Insight: {BHH Insight2:20797}  Engagement in Group:  {BHH ENGAGEMENT IN HMNLE:77731}  Modes of Intervention:  {BHH MODES OF INTERVENTION:22269}  Additional Comments:  ***  Lacorey Brusca L 01/11/2024, 9:26 PM

## 2024-01-11 NOTE — Progress Notes (Signed)
 PROGRESS NOTE    Natalie Rose  FMW:[id number] DOB: [birth year] DOA: 01/08/2024 PCP: [facility]  Chief Complaint  Patient presents with   Abdominal Pain    Hospital Course:  Oral [place] with COPD, anxiety, depression, type 2 diabetes, PTSD, migraine disorder, who was recently discharged on 7/26 after undergoing cholecystectomy for acute cholecystitis.  Patient was at home and reports she smoked some marijuana and subsequently began experiencing nausea vomiting.  She presented to the ER for evaluation.  Upon presentation she was hemodynamically stable and labs revealed a leukocytosis of 24.6 with neutrophilia and thrombocytosis.  While in the ED patient reports she began having a panic attack and she took 10 tabs of her home dose Klonopin .  She denies suicidal attempt.  She was admitted for further workup.  Subjective: Bedside RN reports that patient has been very tearful this morning and requesting to discharge home.  At the time of my evaluation patient has flat affect and requires significant prompting to answer questions.  She denies any pain, nausea, vomiting.  Objective: Vitals:   01/11/24 0833 01/11/24 0858 01/11/24 1143 01/11/24 1518  BP: (!) 162/102 (!) 155/88 (!) 141/92 131/84  Pulse: 71  78 (!) 57  Resp: 17  17 15   Temp: 98.5 F (36.9 C)  98.2 F (36.8 C) 98.3 F (36.8 C)  TempSrc:      SpO2: 100%  97% 98%  Weight:      Height:        Intake/Output Summary (Last 24 hours) at 01/11/2024 1552 Last data filed at 01/11/2024 1349 Gross per 24 hour  Intake 100 ml  Output --  Net 100 ml   Filed Weights   01/08/24 2240  Weight: 81.6 kg    Examination: General exam: Appears calm and comfortable, NAD  Respiratory system: No work of breathing, symmetric chest wall expansion Cardiovascular system: S1 & S2 heard, RRR.  Gastrointestinal system: Abdomen is nondistended, soft appropriately tender to palpation around prior surgical sites which  are well-healing Neuro: Drowsy but easily arousable, oriented x 3 Extremities: Symmetric, expected ROM Skin: No rashes, lesions Psychiatry: Demonstrates appropriate judgement and insight. Mood & affect appropriate for situation.   Assessment & Plan:  Principal Problem:   Intractable nausea and vomiting Active Problems:   Acute lower UTI   Anxiety and depression   Benzodiazepine overdose   Type 2 diabetes mellitus without complications (HCC)    Benzodiazepine overdose - Patient reportedly took 10 tabs of 0.5 Klonopin  in ED.  She reports this was not a suicide attempt but instead a response to overwhelming anxiety and panic attack. - Psychiatry has been consulted, will likely discharge to inpatient psych.  Pending further decisions via psychiatry team - Patient also follows closely with psychiatric team outpatient.  Depression Anxiety PTSD - Holding home meds for now.  Further management per psychiatry.  Right arm swelling - Likely positional.  Has improved now without intervention - Doppler negative for DVT  Nausea and vomiting - Differential is broad.  Patient does report smoking marijuana immediately prior to this which may have induced this event - CT on arrival without acute abnormality.  Status post cholecystectomy - No evidence of nausea or vomiting since admission  Leukocytosis, chronic - Patient has chronic leukocytosis which she has previously seen hematology for.  Acute worsening this admission may have been reactive due to UTI - Peripheral smear with unremarkable morphologies. - Will complete ceftriaxone  x 3 days.  Urine culture currently growing multiple species.  Recollection unlikely to be helpful after 48 hours of antibiotic. - WBC appears to have resolved entirely now.   Recent cholecystectomy - No acute concerns. Was seen by general surgery.  No need for surgical intervention at this time.  CMP within normal limits.  No acute abnormality on CT  Type 2  diabetes mellitus  -- Hemoglobin A1c 6.7% - Hold home dose metformin  for now - Continue sliding scale insulin , titrate as needed.   DVT prophylaxis: Lovenox    Code Status: Full Code Disposition: Inpatient.  Medically ready for discharge pending psychiatry treatment plan.  May require discharge to inpatient psych  Consultants:    Procedures:    Antimicrobials:  Anti-infectives (From admission, onward)    Start     Dose/Rate Route Frequency Ordered Stop   01/09/24 2200  cefTRIAXone  (ROCEPHIN ) 2 g in sodium chloride  0.9 % 100 mL IVPB        2 g 200 mL/hr over 30 Minutes Intravenous Every 24 hours 01/09/24 0555     01/09/24 0030  cefTRIAXone  (ROCEPHIN ) 2 g in sodium chloride  0.9 % 100 mL IVPB        2 g 200 mL/hr over 30 Minutes Intravenous  Once 01/09/24 0016 01/09/24 0127       Data Reviewed: I have personally reviewed following labs and imaging studies CBC: Recent Labs  Lab 01/08/24 2252 01/09/24 0303 01/09/24 1335 01/10/24 0831 01/11/24 0853  WBC 24.6* 16.2* 18.2* 13.8* 9.7  NEUTROABS 17.5*  --  15.4*  --  5.1  HGB 13.4 13.3 13.5 11.6* 12.7  HCT 40.1 40.2 39.9 35.8* 39.3  MCV 83.2 83.4 82.6 84.4 84.9  PLT 417* 362 384 342 354   Basic Metabolic Panel: Recent Labs  Lab 01/08/24 2252 01/09/24 0303 01/10/24 0831 01/11/24 0853  NA 138 138 141 140  K 4.2 4.5 3.4* 3.5  CL 106 105 107 105  CO2 21* 23 23 25   GLUCOSE 170* 160* 105* 125*  BUN 15 13 9 11   CREATININE 0.84 0.76 0.65 0.74  CALCIUM 9.6 8.8* 8.6* 9.1  MG 2.1  --   --   --    GFR: Estimated Creatinine Clearance: 86.5 mL/min (by C-G formula based on SCr of 0.74 mg/dL). Liver Function Tests: Recent Labs  Lab 01/08/24 2252 01/09/24 0303 01/10/24 0831 01/11/24 0853  AST 36 33 38 37  ALT 37 35 40 46*  ALKPHOS 81 76 63 67  BILITOT 0.4 0.3 0.5 0.7  PROT 8.6* 7.8 6.7 7.4  ALBUMIN 4.6 4.0 3.5 3.8   CBG: Recent Labs  Lab 01/10/24 1146 01/10/24 1642 01/10/24 2316 01/11/24 0750 01/11/24 1139   GLUCAP 108* 117* 106* 128* 95    Recent Results (from the past 240 hours)  Urine Culture     Status: Abnormal   Collection Time: 01/08/24 10:59 PM   Specimen: Urine, Clean Catch  Result Value Ref Range Status   Specimen Description   Final    URINE, CLEAN CATCH Performed at [facility], [address], [place], [place] [address]    Special Requests   Final    NONE Performed at [facility], [address] [place], [place] [address]    Culture MULTIPLE SPECIES PRESENT, SUGGEST RECOLLECTION (A)  Final   Report Status 01/10/2024 FINAL  Final     Radiology Studies: US  Venous Img Upper Uni Right(DVT) Result Date: 01/10/2024 CLINICAL DATA:  Edema 9965.  Forearm edema. EXAM: RIGHT UPPER EXTREMITY VENOUS DOPPLER ULTRASOUND TECHNIQUE: Gray-scale sonography with  graded compression, as well as color Doppler and duplex ultrasound were performed to evaluate the upper extremity deep venous system from the level of the subclavian vein and including the jugular, axillary, basilic, radial, ulnar and upper cephalic vein. Spectral Doppler was utilized to evaluate flow at rest and with distal augmentation maneuvers. COMPARISON:  None Available. FINDINGS: Contralateral Subclavian Vein: Respiratory phasicity is normal and symmetric with the symptomatic side. No evidence of thrombus. Normal compressibility. Internal Jugular Vein: No evidence of thrombus. Normal compressibility, respiratory phasicity and color Doppler flow. Subclavian Vein: No evidence of thrombus. Normal color Doppler flow and phasicity. Axillary Vein: No evidence of thrombus. Normal compressibility, respiratory phasicity and response to augmentation. Cephalic Vein: No evidence of thrombus. Normal compressibility, respiratory phasicity and response to augmentation. Basilic Vein: No evidence of thrombus. Normal compressibility, respiratory phasicity and response to augmentation. Brachial Veins: No evidence of thrombus. Normal  compressibility, respiratory phasicity and response to augmentation. Radial Veins: No evidence of thrombus. Normal compressibility and color Doppler flow. Ulnar Veins: No evidence of thrombus. Normal compressibility and color Doppler flow. Other Findings:  None visualized. IMPRESSION: Negative for deep venous thrombosis in the right upper extremity. Electronically Signed   By: Juliene Balder M.D.   On: 01/10/2024 16:50    Scheduled Meds:  enoxaparin  (LOVENOX ) injection  40 mg Subcutaneous Q24H   insulin  aspart  0-15 Units Subcutaneous TID WC   insulin  aspart  0-5 Units Subcutaneous QHS   Continuous Infusions:  cefTRIAXone  (ROCEPHIN )  IV Stopped (01/10/24 2251)     LOS: 1 day  MDM: Patient is high risk for one or more organ failure.  They necessitate ongoing hospitalization for continued IV therapies and subsequent lab monitoring. Total time spent interpreting labs and vitals, reviewing the medical record, coordinating care amongst consultants and care team members, directly assessing and discussing care with the patient and/or family: 55 min  Shaolin Armas, DO [facility]  To contact the attending physician between 7A-7P please use Epic Chat. To contact the covering physician during after hours 7P-7A, please review Amion.  01/11/2024, 3:52 PM   *This document has been created with the assistance of dictation software. Please excuse typographical errors. *

## 2024-01-11 NOTE — Consult Note (Addendum)
 [facility] Psychiatric Consult Initial  Patient Name: .Natalie Rose  MRN: [id number]  DOB: [birth year]  Consult Order details:  Orders (From admission, onward)     Start     Ordered   01/09/24 0547  IP CONSULT TO PSYCHIATRY       Ordering Provider: Mansy, Jan A, MD  Provider:  (Not yet assigned)  Question Answer Comment  Location [facility]   Reason for Consult? klonopin  OD      01/09/24 0550   01/09/24 0157  IP CONSULT TO PSYCHIATRY       Ordering Provider: Neomi Josette SAILOR, DO  Provider:  (Not yet assigned)  Question Answer Comment  Consult Timeframe STAT - requires a response within one hour   STAT timeframe requires provider to provider communication, has the provider to provider communication been completed Yes   Reason for Consult? SI, IVC   Contact phone number where the requesting provider can be reached [phone]      01/09/24 0157   01/09/24 0156  CONSULT TO CALL ACT TEAM       Ordering Provider: Neomi Josette SAILOR, DO  Provider:  (Not yet assigned)  Question:  Reason for Consult?  Answer:  Psych consult   01/09/24 0157             Mode of Visit: In person    Psychiatry Consult Evaluation  Service Date: January 11, 2024 LOS:  LOS: 1 day  Chief Complaint I was throwing up  Primary Psychiatric Diagnoses  GAD with panic attacks   Assessment  Natalie Rose is a 43 y.o. female admitted: MedicallyAmanda Kerly Rose is a 43 y.o. female with medical history significant for COPD, anxiety, depression, type 2 diabetes mellitus, PTSD and migraine, who presented with acute onset of epigastric abdominal pain as well as intractable nausea and vomiting that started today with associated diarrhea.  The patient had a cholecystectomy on 12/28/2023 by Dr. Marinda for acute cholecystitis.  Per chart patient took 8-10 of Klonopin  in the ED.  Psychiatry is consulted as patient was admitted on [facility] and for safety evaluation  On  assessment patient consistently denies SI/HI/plan and consistently reports that she took 8-10 of Klonopin  0.5 mg as she was having panic attacks in the emergency room.  She remains future oriented and is willing to participate in outpatient mental health services.  She reports that she is planning to stay close to her mother.  She denies auditory/visual hallucinations.  Will reach out to family for collateral information and safety assessment at home.  Will follow-up tomorrow and continue to assess the need for IVC after we get collateral information     Diagnoses:  Active Hospital problems: Principal Problem:   Intractable nausea and vomiting Active Problems:   Acute lower UTI   Anxiety and depression   Benzodiazepine overdose   Type 2 diabetes mellitus without complications (HCC)    Plan   ## Psychiatric Medication Recommendations:  Continue her home medication  ## Medical Decision Making Capacity: Not specifically addressed in this encounter  ## Further Work-up:   No further workup   ## Disposition:-- There are no psychiatric contraindications to discharge at this time  ## Behavioral / Environmental: - No specific recommendations at this time.     ## Safety and Observation Level:  - Based on my clinical evaluation, I estimate the patient to be at low risk of self harm in the current setting. - At this time, we recommend  routine. This decision is based on my review of the chart including patient's history and current presentation, interview of the patient, mental status examination, and consideration of suicide risk including evaluating suicidal ideation, plan, intent, suicidal or self-harm behaviors, risk factors, and protective factors. This judgment is based on our ability to directly address suicide risk, implement suicide prevention strategies, and develop a safety plan while the patient is in the clinical setting. Please contact our team if there is a concern that risk  level has changed.  CSSR Risk Category:C-SSRS RISK CATEGORY: No Risk  Suicide Risk Assessment: Patient has following modifiable risk factors for suicide: triggering events, which we are addressing by intensive therapy outpatient. Patient has following non-modifiable or demographic risk factors for suicide: psychiatric hospitalization Patient has the following protective factors against suicide: Supportive family  Thank you for this consult request. Recommendations have been communicated to the primary team.  We will follow-up tomorrow at this time.   Inessa Wardrop, MD       History of Present Illness  Relevant Aspects of [facility]   Patient Report:  On interview patient is able to answer the orientation questions stating she is at [facility] date as August 2025 president as Trump.  She reports that she was having intractable vomitings and came to the emergency room.  She talks about having gallbladder surgery recently and since that she lost 10 pounds and is unable to eat.  The vomiting started few days ago.  She reports that in the emergency room she got anxious that she was having panic attack and took 8-10 of Klonopin .  She denies taking such an overdose before.  She reports history of depression but denies hopelessness worthlessness, denies anhedonia.  Has fair energy and motivation, sleeps for 8 hours.  Denies SI/HI/intent/plan.  Denies anxiety but has panic attacks on and off and the last panic attack was in the ED.  She denies auditory/visual hallucinations.  Denies nightmares or flashbacks from any traumatic experience.  She denies recent or current episodes of mania/hypomania   Collateral information:  Called mom [phone] to voicemail    Psychiatric and Social History  Psychiatric History:  Information collected from patient  Prev Dx/Sx: Depression and anxiety Current [facility] Meds (current): Klonopin , Zoloft , gabapentin , Remeron ,  propranolol , prazosin  Previous Med Trials: Unknown Therapy: Currently has a therapist  Prior Psych Hospitalization: February 2025 Prior Self Harm: Many years ago Prior Violence: Denies  Family Psych History: Maternal grandfather died by suicide, mom with anxiety, her dad with alcohol use disorder Family Hx suicide: As above  Social History:   Educational Hx: Ninth grade Occupational Hx: On disability Legal Hx: Denies Living Situation: By self Spiritual Hx:SABRA Access to weapons/lethal means: Denies  Substance History Alcohol: Denies  Tobacco: 1 pack/day Illicit drugs: Cannabis, for many years, every day, 1 blunt, last use was yesterday Prescription drug abuse: Denies Rehab hx: Denies  Exam Findings  Physical Exam: Reviewed and agree with the physical exam findings conducted by the ED provider Vital Signs:  Temp:  [98.4 F (36.9 C)-99.1 F (37.3 C)] 99.1 F (37.3 C) (08/08 1519) Pulse Rate:  [63-83] 69 (08/08 1519) Resp:  [16-18] 18 (08/08 1519) BP: (106-138)/(69-99) 106/69 (08/08 1519) SpO2:  [97 %-100 %] 98 % (08/08 1519) Blood pressure 106/69, pulse 69, temperature 99.1 F (37.3 C), temperature source Oral, resp. rate 18, height 4' 11 (1.499 m), weight 81.6 kg, last menstrual period 12/28/2023, SpO2 98%. Body mass index is 36.36 kg/m.  Mental Status Exam: General Appearance: Casual  Orientation:  Full (Time, Place, and Person)  Memory:  Immediate;   Fair Recent;   Fair Remote;   Fair  Concentration:  Concentration: Fair and Attention Span: Fair  Recall:  Fair  Attention  Fair  Eye Contact:  Fair  Speech:  Clear and Coherent  Language:  Fair  Volume:  Normal  Mood: fine  Affect:  Appropriate  Thought Process:  Coherent  Thought Content:  Logical  Suicidal Thoughts:  No  Homicidal Thoughts:  No  Judgement:  Impaired  Insight:  Shallow  Psychomotor Activity:  Normal  Akathisia:  No  Fund of Knowledge:  Fair      Assets:  Communication  Skills Desire for Improvement  Cognition:  WNL  ADL's:  Intact  AIMS (if indicated):        Other History   These have been pulled in through the EMR, reviewed, and updated if appropriate.  Family History:  The patient's family history includes Depression in her mother; Diabetes in her father; Heart failure in her mother; Osteoarthritis in her mother; Osteopenia in her mother.  Medical History: Past Medical History:  Diagnosis Date   Anxiety    Arthritis    COPD (chronic obstructive pulmonary disease) (HCC)    Depression    Diabetes mellitus without complication (HCC)    Mental health disorder    Migraines     Surgical History: Past Surgical History:  Procedure Laterality Date   CHOLECYSTECTOMY     MOUTH SURGERY       Medications:   Current Facility-Administered Medications:    acetaminophen  (TYLENOL ) tablet 650 mg, 650 mg, Oral, Q6H PRN **OR** acetaminophen  (TYLENOL ) suppository 650 mg, 650 mg, Rectal, Q6H PRN, Mansy, Jan A, MD   cefTRIAXone  (ROCEPHIN ) 2 g in sodium chloride  0.9 % 100 mL IVPB, 2 g, Intravenous, Q24H, Mansy, Jan A, MD, Last Rate: 200 mL/hr at 01/10/24 2221, 2 g at 01/10/24 2221   enoxaparin  (LOVENOX ) injection 40 mg, 40 mg, Subcutaneous, Q24H, Mansy, Jan A, MD, 40 mg at 01/10/24 9044   insulin  aspart (novoLOG ) injection 0-15 Units, 0-15 Units, Subcutaneous, TID WC, Mansy, Jan A, MD, 2 Units at 01/09/24 1649   insulin  aspart (novoLOG ) injection 0-5 Units, 0-5 Units, Subcutaneous, QHS, Mansy, Jan A, MD   magnesium  hydroxide (MILK OF MAGNESIA) suspension 30 mL, 30 mL, Oral, Daily PRN, Mansy, Jan A, MD   ondansetron  (ZOFRAN ) tablet 4 mg, 4 mg, Oral, Q6H PRN **OR** ondansetron  (ZOFRAN ) injection 4 mg, 4 mg, Intravenous, Q6H PRN, Mansy, Jan A, MD, 4 mg at 01/09/24 1117   traZODone  (DESYREL ) tablet 25 mg, 25 mg, Oral, QHS PRN, Mansy, Jan A, MD, 25 mg at 01/10/24 2214  Allergies: Allergies  Allergen Reactions   Diazepam  Hives, Anxiety, Itching, Other (See  Comments) and Rash    unknown unknown    Lurasidone     Homero Hyson, MD

## 2024-01-11 NOTE — Tx Team (Signed)
 Initial Treatment Plan 01/11/2024 9:30 PM Rogan Ecklund FMW:[id number]    PATIENT STRESSORS: Substance abuse   Other: Impulsivity, health concerns     PATIENT STRENGTHS: Supportive family/friends    PATIENT IDENTIFIED PROBLEMS:                      DISCHARGE CRITERIA:  Improved stabilization in mood, thinking, and/or behavior Medical problems require only outpatient monitoring  PRELIMINARY DISCHARGE PLAN: Return to previous living arrangement  PATIENT/FAMILY INVOLVEMENT: This treatment plan has been presented to and reviewed with the patient, Natalie Rose, and/or family member, mother.  The patient and family have been given the opportunity to ask questions and make suggestions.  Jon JULIANNA Lucks, RN 01/11/2024, 9:30 PM

## 2024-01-12 DIAGNOSIS — F410 Panic disorder [episodic paroxysmal anxiety] without agoraphobia: Secondary | ICD-10-CM

## 2024-01-12 DIAGNOSIS — F411 Generalized anxiety disorder: Principal | ICD-10-CM

## 2024-01-12 LAB — GLUCOSE, CAPILLARY
Glucose-Capillary: 123 mg/dL — ABNORMAL HIGH (ref 70–99)
Glucose-Capillary: 120 mg/dL — ABNORMAL HIGH (ref 70–99)
Glucose-Capillary: 109 mg/dL — ABNORMAL HIGH (ref 70–99)
Glucose-Capillary: 112 mg/dL — ABNORMAL HIGH (ref 70–99)

## 2024-01-12 MED ORDER — HALOPERIDOL 5 MG PO TABS: 5.0000 mg | ORAL_TABLET | Freq: Three times a day (TID) | ORAL | Status: DC | PRN

## 2024-01-12 MED ORDER — LORAZEPAM 2 MG/ML IJ SOLN: 2.0000 mg | Freq: Three times a day (TID) | INTRAMUSCULAR | Status: DC | PRN

## 2024-01-12 MED ORDER — DIPHENHYDRAMINE HCL 25 MG PO CAPS: 50.0000 mg | ORAL_CAPSULE | Freq: Three times a day (TID) | ORAL | Status: DC | PRN

## 2024-01-12 MED ORDER — HALOPERIDOL LACTATE 5 MG/ML IJ SOLN: 5.0000 mg | Freq: Three times a day (TID) | INTRAMUSCULAR | Status: DC | PRN

## 2024-01-12 MED ORDER — HALOPERIDOL LACTATE 5 MG/ML IJ SOLN: 10.0000 mg | Freq: Three times a day (TID) | INTRAMUSCULAR | Status: DC | PRN

## 2024-01-12 MED ORDER — DIPHENHYDRAMINE HCL 50 MG/ML IJ SOLN: 50.0000 mg | Freq: Three times a day (TID) | INTRAMUSCULAR | Status: DC | PRN

## 2024-01-12 MED ORDER — OLANZAPINE-SAMIDORPHAN 10-10 MG PO TABS: 1.0000 | ORAL_TABLET | Freq: Every day | ORAL | Status: DC

## 2024-01-12 MED ORDER — OLANZAPINE 10 MG PO TABS
10.0000 mg | ORAL_TABLET | Freq: Every day | ORAL | Status: DC
  Administered 2024-01-12 – 2024-01-16 (×8): 10 mg via ORAL
  Filled 2024-01-12 (×5): qty 1

## 2024-01-12 NOTE — [facility] Suicide Risk Assessment (Addendum)
 [facility] INPATIENT:  Family/Significant Other Suicide Prevention Education  Suicide Prevention Education:  Education Completed; Natalie Rose, mom, [phone],  has been identified by the patient as the family member/significant other with whom the patient will be residing, and identified as the person(s) who will aid the patient in the event of a mental health crisis (suicidal ideations/suicide attempt).  With written consent from the patient, the family member/significant other has been provided the following suicide prevention education, prior to the and/or following the discharge of the patient.  The suicide prevention education provided includes the following: Suicide risk factors Suicide prevention and interventions National Suicide Hotline telephone number [facility] assessment telephone number [facility] Emergency Assistance 911 [facility] and/or Residential Mobile Crisis Unit telephone number  Request made of family/significant other to: Remove weapons (e.g., guns, rifles, knives), all items previously/currently identified as safety concern.   Remove drugs/medications (over-the-counter, prescriptions, illicit drugs), all items previously/currently identified as a safety concern.  The family member/significant other verbalizes understanding of the suicide prevention education information provided.  The family member/significant other agrees to remove the items of safety concern listed above.  According to mother, the patient's "pain, constant nausea and vomiting" is what bought her to the hospital. According to mother, "this has been going on for a while." Mother reported that the patient's "pain bought fear." Mother reported that the patient has been going to [facility] for psychiatry but "they just keep adding this and adding that which causes "not desirable side effects." Mother reported that at times, they will just "start over" with the patient's medications.    Mother reported that the patient is in a "Constant battle with medications." Mother reported that the patient's "state of mind varies throughout the years." Mother reported that the patient's current apartments has a history of "mold issues" which she believes has to do with the patient's current symptoms. Mother reported "that needs to be investigated."  Mother reported that she does not believe the patient to be a danger to others but reported "I don't know about herself" due to her history with self-harming. Mother reported that the patient's inability to work due to panic attacks, and possible boredom may be causing some of the patient's behaviors.   Mother reported that the patient does not currently have access to weapons. Mother expressed no current safety concerns. Mother reported that the patient can return home at discharge and she will need transportation.   Natalie Rose M Natalie Rose 01/12/2024, 3:46 PM

## 2024-01-12 NOTE — Group Note (Signed)
 Date:  01/12/2024 Time:  4:57 PM  Group Topic/Focus:  Healthy Communication:   The focus of this group is to discuss communication, barriers to communication, as well as healthy ways to communicate with others.    Participation Level:  Minimal  Participation Quality:  Appropriate  Affect:  Flat  Cognitive:  Appropriate  Insight: Appropriate  Engagement in Group:  Limited  Modes of Intervention:  Socialization  Additional Comments:  n/a  Denys Labree 01/12/2024, 4:57 PM

## 2024-01-12 NOTE — Group Note (Signed)
 Date:  01/12/2024 Time:  9:30 PM  Group Topic/Focus:  Wrap-Up Group:   The focus of this group is to help patients review their daily goal of treatment and discuss progress on daily workbooks.    Participation Level:  Minimal  Participation Quality:  Appropriate and Attentive  Affect:  Appropriate  Cognitive:  Alert  Insight: Appropriate  Engagement in Group:  Limited  Modes of Intervention:  Discussion and Orientation  Additional Comments:     Maglione,Cowen Pesqueira E 01/12/2024, 9:30 PM

## 2024-01-12 NOTE — Progress Notes (Signed)
 Patient presents: Flat affect and tearful. Patient is med compliant. Observed in dayroom area throughout day.    SI/HI/AVH: Denies   Plan: Denies   Groups attended: 1/2   Appetite: Adequate. Attended meals.   Sleep: No sleep disturbances reported.   PRNS: N/A   Disturbances: No disturbances. Patient remains cooperative in milieu.    Questions/concerns: Patient stated she forgot to remind provider of her Seroquel  200mg  that helps her with sleep. Patient stated she would speak with provider tomorrow.   Blood Sugar Levels:  7:44am: 123 - 2 units of insulin   12:05pm- 120- no insulin  required  16:31- 109 - no insulin  required   VS: BP 128/87 (BP Location: Right Arm)   Pulse 77   Temp 97.8 F (36.6 C)   Resp 16   Ht 4' 11 (1.499 m)   Wt 79.4 kg   LMP 12/28/2023 (Exact Date)   SpO2 99%   BMI 35.35 kg/m

## 2024-01-12 NOTE — Group Note (Signed)
 Date:  01/12/2024 Time:  4:59 PM  Group Topic/Focus:  Activity Group: The focus of the group is to promote activity for the patients and to encourage them to go outside to the courtyard for some fresh air and some exercise.    Participation Level:  Did Not Attend  Natalie Rose Natalie Rose 01/12/2024, 4:59 PM

## 2024-01-12 NOTE — H&P (Signed)
 Psychiatric Admission Assessment Adult  Patient Identification: Natalie Rose MRN:  [id number] Date of Evaluation:  01/12/2024 Chief Complaint:  Generalized anxiety disorder with panic attacks [F41.1, F41.0]   History of Present Illness:  Natalie Rose is a 43 y.o. female with medical history significant for COPD, anxiety, depression, type 2 diabetes mellitus, PTSD and migraine, who presented with acute onset of epigastric abdominal pain as well as intractable nausea and vomiting that started today with associated diarrhea.   43 year old female with medical history significant for COPD, anxiety, depression, type 2 diabetes mellitus, PTSD, and migraine, who presented with acute onset of epigastric abdominal pain as well as intractable nausea and vomiting that started today with associated diarrhea. The patient had a cholecystectomy on 12/28/2023. She is now seen for initial psychiatric evaluation. Upon approach, she is noted to be sitting quietly at a table with peers and is reluctantly agreeable for interview with this provider.  Patient Report She denies that her mood has been worsening overall. She states she simply "freaked out and had a panic attack" related to pain she was experiencing in the post-operative period. She reports this was not an event to end her life; however, she states she took medication in excessive amounts in an attempt to resolve the panic. She clarifies that she took eight pills knowing they would not hurt her. Patient reports symptoms of sadness, crying spells, depression, and hopelessness, stating, "It's just that I'm here again. I don't understand." She adamantly denies that the current episode was a suicide attempt, though collateral from her mother contradicts this, and she does admit to an overdose attempt in February 2025.  She reports ongoing outpatient psychiatric care with [facility]. She lives in [place] by herself in an apartment and  states that things are going well at home. She denies current suicidal or homicidal ideation. She reports that her previously prescribed psychiatric medications have been effective and wishes to continue them with some adjustments as needed.  Per Chart Review Past psychiatric diagnoses: Anxiety, depression, PTSD. History of overdose attempt: February 2025. Outpatient care: [facility]. Current medications prior to admission: Klonopin , Zoloft , gabapentin , Remeron , propranolol , prazosin . Previous inpatient psychiatric admissions: Multiple.  Social History Born in [place], [place] . Raised by her mother. Has one sister. Completed education through the 10th grade. On disability for mental health. Never married; no children. Lives alone in [place].  Substance Use History Tobacco: Smokes one pack per day. Alcohol: Denies. Illicit drugs: Daily cannabis use for many years (approximately one blunt per day). Prescription drug abuse: Denies. Rehab history: Denies.    Per chart review, Collateral information:  01/11/2024: Patient's mother, [name] contacted for collateral information [phone] and had a lengthy conversation regarding patient's past and current mental health. Patient's mother reports that patient has attempted suicide by overdose at least two to three times that she can remember. She relates that patient attempted suicide by overdosing on her medications in February 2025 and was admitted inpatient here at [facility], while in the ED at [facility] waiting to be seen; she overdosed on her home medications, and again in 2017 she overdosed and was admitted to [facility]. However, she was in ICU for several days prior to being admitted to the behavioral health unit there.    Ms. Mace reports that patient has called her several times today saying,  help me, help me which means she wants to go home and smoke cannabis and cigarettes fearing that she will be admitted to the  inpatient behavioral health unit. She states that patient has had episodes of severe nausea and vomiting related to cannabis use and if she stops smoking for a day, then she becomes very ill. Her mother reports that patient has been severely depressed and struggling. She states that patient lives approximately 3 miles from her in an apartment and has dogs for support. Her mother that patient also has occasional issues with chronic pain which she seeks pain medication. However, she relates that patient has had an extensive mental health history starting when she was 74 to 43 years old seeing a therapist. Mother states that patient's father has history of paranoid schizophrenia and her mother has anxiety and depression.       Total Time spent with patient: 1 hour Sleep  Sleep:No data recorded Is the patient at risk to self? Yes.    Has the patient been a risk to self in the past 6 months? Yes.    Has the patient been a risk to self within the distant past? Yes.    Is the patient a risk to others? No.  Has the patient been a risk to others in the past 6 months? No.  Has the patient been a risk to others within the distant past? No.   [place] Scale:  Flowsheet Row Admission (Current) from 01/11/2024 in [facility] ED to Hosp-Admission (Discharged) from 01/08/2024 in [facility] to Hosp-Admission (Discharged) from 12/28/2023 in [facility]  C-SSRS RISK CATEGORY No Risk No Risk No Risk     Past Medical History:  Past Medical History:  Diagnosis Date   Anxiety    Arthritis    COPD (chronic obstructive pulmonary disease) (HCC)    Depression    Diabetes mellitus without complication (HCC)    Mental health disorder    Migraines     Past Surgical History:  Procedure Laterality Date   CHOLECYSTECTOMY     MOUTH SURGERY     Family History:  Family History  Problem Relation Age of Onset   Osteopenia  Mother    Heart failure Mother    Osteoarthritis Mother    Depression Mother    Diabetes Father     Social History:  Social History   Substance and Sexual Activity  Alcohol Use Not Currently     Social History   Substance and Sexual Activity  Drug Use Yes   Frequency: 3.0 times per week   Types: Marijuana      Allergies:   Allergies  Allergen Reactions   Diazepam  Hives, Anxiety, Itching, Other (See Comments) and Rash    unknown unknown    Ativan  [Lorazepam ] Other (See Comments)    Per pt   Lurasidone    Lab Results:  Results for orders placed or performed during the hospital encounter of 01/11/24 (from the past 48 hours)  Glucose, capillary     Status: Abnormal   Collection Time: 01/11/24 10:07 PM  Result Value Ref Range   Glucose-Capillary 144 (H) 70 - 99 mg/dL    Comment: Glucose reference range applies only to samples taken after fasting for at least 8 hours.  Glucose, capillary     Status: Abnormal   Collection Time: 01/12/24  7:44 AM  Result Value Ref Range   Glucose-Capillary 123 (H) 70 - 99 mg/dL    Comment: Glucose reference range applies only to samples taken after fasting for at least 8 hours.  Glucose, capillary  Status: Abnormal   Collection Time: 01/12/24 12:05 PM  Result Value Ref Range   Glucose-Capillary 120 (H) 70 - 99 mg/dL    Comment: Glucose reference range applies only to samples taken after fasting for at least 8 hours.  Glucose, capillary     Status: Abnormal   Collection Time: 01/12/24  4:31 PM  Result Value Ref Range   Glucose-Capillary 109 (H) 70 - 99 mg/dL    Comment: Glucose reference range applies only to samples taken after fasting for at least 8 hours.    Blood Alcohol level:  Lab Results  Component Value Date   [facility] <15 01/09/2024   ETH <10 01/11/2023    Metabolic Disorder Labs:  Lab Results  Component Value Date   HGBA1C 6.7 (H) 01/08/2024   MPG 146 01/08/2024   MPG 137 01/11/2023   No results found for:  PROLACTIN Lab Results  Component Value Date   CHOL 270 (H) 01/11/2023   TRIG 140 01/11/2023   HDL 46 01/11/2023   CHOLHDL 5.9 01/11/2023   VLDL 28 01/11/2023   LDLCALC 196 (H) 01/11/2023   LDLCALC 146 (H) 09/17/2022    Current Medications: Current Facility-Administered Medications  Medication Dose Route Frequency Provider Last Rate Last Admin   acetaminophen  (TYLENOL ) tablet 650 mg  650 mg Oral Q6H PRN Montague, Crystal J, NP       Or   acetaminophen  (TYLENOL ) suppository 650 mg  650 mg Rectal Q6H PRN Montague, Crystal J, NP       insulin  aspart (novoLOG ) injection 0-15 Units  0-15 Units Subcutaneous TID WC Montague, Crystal J, NP   2 Units at 01/12/24 9180   insulin  aspart (novoLOG ) injection 0-5 Units  0-5 Units Subcutaneous QHS Montague, Crystal J, NP       magnesium  hydroxide (MILK OF MAGNESIA) suspension 30 mL  30 mL Oral Daily PRN Montague, Crystal J, NP       nicotine  (NICODERM CQ  - dosed in mg/24 hours) patch 14 mg  14 mg Transdermal Q0600 Jadapalle, Sree, MD   14 mg at 01/12/24 0818   nicotine  polacrilex (NICORETTE ) gum 2 mg  2 mg Oral PRN Jadapalle, Sree, MD       ondansetron  (ZOFRAN ) tablet 4 mg  4 mg Oral Q6H PRN Montague, Crystal J, NP       Or   ondansetron  (ZOFRAN ) injection 4 mg  4 mg Intravenous Q6H PRN Montague, Crystal J, NP       traZODone  (DESYREL ) tablet 25 mg  25 mg Oral QHS PRN Montague, Crystal J, NP   25 mg at 01/11/24 2213   PTA Medications: Medications Prior to Admission  Medication Sig Dispense Refill Last Dose/Taking   albuterol  (VENTOLIN  HFA) 108 (90 Base) MCG/ACT inhaler Inhale 2 puffs into the lungs every 4 (four) hours as needed. 18 g 0    atenolol  (TENORMIN ) 50 MG tablet Take 1 tablet (50 mg total) by mouth daily. 30 tablet 1    gabapentin  (NEURONTIN ) 300 MG capsule Take 1 capsule (300 mg total) by mouth 3 (three) times daily. 90 capsule 1    LYBALVI  10-10 MG TABS Take 1 tablet by mouth daily.      metFORMIN  (GLUCOPHAGE ) 500 MG tablet Take 1  tablet (500 mg total) by mouth 2 (two) times daily with a meal. 60 tablet 1    Spacer/Aero-Holding Chambers (AEROCHAMBER MV) inhaler Use as instructed 1 each 2    sucralfate  (CARAFATE ) 1 g tablet Take 1 g by mouth 4 (four)  times daily.       Psychiatric Specialty Exam: Appearance: Disheveled, poor hygiene, fair eye contact. Behavior: Reluctantly engaged, sitting quietly with peers. Speech: Clear, coherent, normal rate and rhythm. Mood: Dysphoric. Affect: Flat, tearful. Thought process: Occasional thought blocking, linear when responding. Thought content: Denies SI/HI, no delusions or hallucinations observed. Memory: Immediate, recent, and remote memory fair. Attention/Concentration: Fair. Recall: Fair. Insight: Shallow. Judgment: Impaired. Fund of Knowledge: Fair. Language: Within normal limits. Activities of Daily Living (ADLs): Intact.  Musculoskeletal: Strength & Muscle Tone: within normal limits Gait & Station: normal  Physical Exam: Physical Exam Constitutional:      Appearance: Normal appearance.  Pulmonary:     Effort: Pulmonary effort is normal.  Musculoskeletal:        General: Normal range of motion.  Neurological:     Mental Status: She is alert.    ROS Blood pressure (!) 116/91, pulse 78, temperature 98.3 F (36.8 C), temperature source Oral, resp. rate 16, height 4' 11 (1.499 m), weight 79.4 kg, last menstrual period 12/28/2023, SpO2 97%. Body mass index is 35.35 kg/m.  Principal Diagnosis: Generalized anxiety disorder with panic attacks Diagnosis:  Principal Problem:   Generalized anxiety disorder with panic attacks   Clinical Decision Making: Patient presentation is meeting diagnostic criteria for major depressive disorder, recurrent episode, without psychotic features, based on persistent symptoms of sadness, hopelessness, anhedonia, impaired insight, and history of prior suicide attempt. Patient continues to require this acute inpatient psychiatric  setting for safety, medication management, and symptom stabilization to address need for continued treatment.  Assessment Patient presentation is meeting diagnostic criteria for major depressive disorder, recurrent episode, without psychotic features, based on persistent symptoms of sadness, hopelessness, anhedonia, impaired insight, and history of prior suicide attempt. Patient continues to require this acute inpatient psychiatric setting for safety, medication management, and symptom stabilization to address need for continued treatment.  Psychiatric Diagnosis Major Depressive Disorder, recurrent, without psychotic features PTSD Cannabis Use Disorder, daily use  It is the judgment of the undersigned that patient is appropriate for treatment in this acute psychiatric setting related to concern for safety in the context of recent medication ingestion and persistent depressive symptoms. Medication education is provided to include risks, benefits, and side effects. Patient verbalized understanding and agrees to plan of care.  Focus will be on stabilization of mood, reinforcement of coping skills, and confirmation of outpatient follow-up with [facility] after discharge. Supportive therapy will be provided to address stress related to medical recovery and psychosocial factors. Abstinence from cannabis will be encouraged, with referral for outpatient substance use counseling upon discharge.   Safety and Monitoring:             -- Voluntary admission to inpatient psychiatric unit for safety, stabilization and treatment             -- Daily contact with patient to assess and evaluate symptoms and progress in treatment             -- Patient's case to be discussed in multi-disciplinary team meeting             -- Observation Level: q15 minute checks             -- Vital signs:  q12 hours             -- Precautions: suicide, elopement, and assault    Diagnoses:  Active Hospital  problems: Principal Problem:   Intractable nausea and vomiting Active Problems:   Acute lower UTI  Anxiety and depression   Benzodiazepine overdose   Type 2 diabetes mellitus without complications (HCC)     4. Discharge Planning:              -- Social work and case management to assist with discharge planning and identification of hospital follow-up needs prior to discharge             -- Estimated LOS: 5-7 days             -- Discharge Concerns: Need to establish a safety plan; Medication compliance and effectiveness             -- Discharge Goals: Return home with outpatient referrals follow ups  Physician Treatment Plan for Primary Diagnosis: Generalized anxiety disorder with panic attacks Long Term Goal(s): Improvement in symptoms so as ready for discharge  Short Term Goals: Ability to identify changes in lifestyle to reduce recurrence of condition will improve  Physician Treatment Plan for Secondary Diagnosis: Principal Problem:   Generalized anxiety disorder with panic attacks  Long Term Goal(s): Improvement in symptoms so as ready for discharge  Short Term Goals: Ability to identify changes in lifestyle to reduce recurrence of condition will improve  I certify that inpatient services furnished can reasonably be expected to improve the patient's condition.    Hoy CHRISTELLA Pinal, NP 8/10/20254:44 PM
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
## 2024-01-12 NOTE — Plan of Care (Signed)
   Problem: Health Behavior/Discharge Planning: Goal: Compliance with treatment plan for underlying cause of condition will improve Outcome: Progressing   Problem: Safety: Goal: Periods of time without injury will increase Outcome: Progressing

## 2024-01-13 DIAGNOSIS — F410 Panic disorder [episodic paroxysmal anxiety] without agoraphobia: Secondary | ICD-10-CM | POA: Diagnosis not present

## 2024-01-13 DIAGNOSIS — F411 Generalized anxiety disorder: Secondary | ICD-10-CM | POA: Diagnosis not present

## 2024-01-13 LAB — LIPID PANEL
Cholesterol: 279 mg/dL — ABNORMAL HIGH (ref 0–200)
HDL: 36 mg/dL — ABNORMAL LOW (ref >40)
LDL Cholesterol: 192 mg/dL — ABNORMAL HIGH (ref 0–99)
Total CHOL/HDL Ratio: 7.8 ratio
Triglycerides: 254 mg/dL — ABNORMAL HIGH (ref <150)
VLDL: 51 mg/dL — ABNORMAL HIGH (ref 0–40)

## 2024-01-13 LAB — GLUCOSE, CAPILLARY
Glucose-Capillary: 126 mg/dL — ABNORMAL HIGH (ref 70–99)
Glucose-Capillary: 109 mg/dL — ABNORMAL HIGH (ref 70–99)
Glucose-Capillary: 118 mg/dL — ABNORMAL HIGH (ref 70–99)
Glucose-Capillary: 116 mg/dL — ABNORMAL HIGH (ref 70–99)

## 2024-01-13 MED ORDER — DULOXETINE HCL 30 MG PO CPEP
30.0000 mg | ORAL_CAPSULE | Freq: Every day | ORAL | Status: DC
  Administered 2024-01-14 – 2024-01-16 (×5): 30 mg via ORAL
  Filled 2024-01-13 (×3): qty 1

## 2024-01-13 NOTE — Group Note (Signed)
 Recreation Therapy Group Note   Group Topic:Coping Skills  Group Date: 01/13/2024 Start Time: 1530 End Time: 1550 Facilitators: Celestia Jeoffrey FORBES ARTICE, CTRS Location: Craft Room  Group Description: Mind Map.  Patient was provided a blank template of a diagram with 32 blank boxes in a tiered system, branching from the center (similar to a bubble chart). LRT directed patients to label the middle of the diagram Coping Skills. LRT and patients then came up with 8 different coping skills as examples. Pt were directed to record their coping skills in the 2nd tier boxes closest to the center.  Patients would then share their coping skills with the group as LRT wrote them out. LRT gave a handout of 99 different coping skills at the end of group.   Goal Area(s) Addressed: Patients will be able to define "coping skills". Patient will identify new coping skills.  Patient will increase communication.   Affect/Mood: Appropriate and Flat   Participation Level: Minimal    Clinical Observations/Individualized Feedback: Natalie Rose was present in the craft room at the time of group. Pt minimally interacted with LRT and peers duration of session.   Plan: Continue to engage patient in RT group sessions 2-3x/week.   Jeoffrey FORBES Celestia, LRT, CTRS 01/13/2024 5:29 PM

## 2024-01-13 NOTE — Progress Notes (Signed)
 PT Cancellation Note  Patient Details Name: Natalie Rose MRN: [id number] DOB: 10-May-1981   Cancelled Treatment:    Reason Eval/Treat Not Completed: PT screened, no needs identified, will sign off Per OT, patient is functioning at independent level which is her baseline. No acute skilled PT needs identified. PT will sign off at this time.   Maryanne Finder, PT, DPT Physical Therapist - [facility]    Tarrin Lebow A Darcus Edds 01/13/2024, 1:20 PM

## 2024-01-13 NOTE — Progress Notes (Signed)
   01/13/24 0808  Psych Admission Type (Psych Patients Only)  Admission Status Involuntary  Psychosocial Assessment  Patient Complaints Depression  Eye Contact Fair  Facial Expression Flat  Affect Sad  Speech Logical/coherent  Interaction Cautious;Guarded  Motor Activity Slow  Appearance/Hygiene Unremarkable  Behavior Characteristics Appropriate to situation  Mood Depressed  Aggressive Behavior  Effect No apparent injury  Thought Process  Coherency WDL  Content WDL  Delusions None reported or observed  Perception WDL  Hallucination None reported or observed  Judgment Limited  Confusion WDL  Danger to Self  Current suicidal ideation? Denies  Agreement Not to Harm Self Yes  Description of Agreement verbal  Danger to Others  Danger to Others None reported or observed

## 2024-01-13 NOTE — Group Note (Signed)
 Recreation Therapy Group Note   Group Topic:Health and Wellness  Group Date: 01/13/2024 Start Time: 1040 End Time: 1140 Facilitators: Celestia Jeoffrey BRAVO, LRT, CTRS Location: Courtyard  Group Description: [employer]. LRT and patients played games of basketball, drew with chalk, and played corn hole while outside in the courtyard while getting fresh air and sunlight. Music was being played in the background. LRT and peers conversed about different games they have played before, what they do in their free time and anything else that is on their minds. LRT encouraged pts to drink water  after being outside, sweating and getting their heart rate up.  Goal Area(s) Addressed: Patient will build on frustration tolerance skills. Patients will partake in a competitive play game with peers. Patients will gain knowledge of new leisure interest/hobby.    Affect/Mood: N/A   Participation Level: Did not attend    Clinical Observations/Individualized Feedback: Patient did not attend group.   Plan: Continue to engage patient in RT group sessions 2-3x/week.   Jeoffrey BRAVO Celestia, LRT, CTRS 01/13/2024 1:19 PM

## 2024-01-13 NOTE — Progress Notes (Signed)
   01/12/24 2300  Psych Admission Type (Psych Patients Only)  Admission Status Involuntary  Psychosocial Assessment  Patient Complaints Depression - because she is here  Eye Contact Fair  Facial Expression Flat  Affect Sad  Speech Logical/coherent  Interaction Guarded - withdrawn, no peer engagement   Motor Activity Other (Comment) (WDL)  Appearance/Hygiene Unremarkable  Behavior Characteristics Appropriate to situation  Mood Depressed  Aggressive Behavior  Effect No apparent injury  Thought Process  Coherency WDL  Content WDL  Delusions None reported or observed  Perception WDL  Hallucination None reported or observed  Judgment Limited  Confusion WDL  Danger to Self  Current suicidal ideation? Denies  Agreement Not to Harm Self Yes  Description of Agreement Verbal  Danger to Others  Danger to Others None reported or observed    Problem: Activity: Goal: Interest or engagement in activities will improve Outcome: Progressing Goal: Sleeping patterns will improve Outcome: Progressing   Problem: Coping: Goal: Ability to verbalize frustrations and anger appropriately will improve Outcome: Progressing Goal: Ability to demonstrate self-control will improve Outcome: Progressing   Problem: Activity: Goal: Interest or engagement in activities will improve Outcome: Progressing Goal: Sleeping patterns will improve Outcome: Progressing

## 2024-01-13 NOTE — BH IP Treatment Plan (Signed)
 Interdisciplinary Treatment and Diagnostic Plan Update  01/13/2024 Time of Session: 10:12 AM Natalie Rose MRN: [id number]  Principal Diagnosis: Generalized anxiety disorder with panic attacks  Secondary Diagnoses: Principal Problem:   Generalized anxiety disorder with panic attacks   Current Medications:  Current Facility-Administered Medications  Medication Dose Route Frequency Provider Last Rate Last Admin   acetaminophen  (TYLENOL ) tablet 650 mg  650 mg Oral Q6H PRN Montague, Crystal J, NP       Or   acetaminophen  (TYLENOL ) suppository 650 mg  650 mg Rectal Q6H PRN Montague, Crystal J, NP       haloperidol  (HALDOL ) tablet 5 mg  5 mg Oral TID PRN Jadapalle, Sree, MD       And   diphenhydrAMINE  (BENADRYL ) capsule 50 mg  50 mg Oral TID PRN Jadapalle, Sree, MD       haloperidol  lactate (HALDOL ) injection 5 mg  5 mg Intramuscular TID PRN Jadapalle, Sree, MD       And   diphenhydrAMINE  (BENADRYL ) injection 50 mg  50 mg Intramuscular TID PRN Jadapalle, Sree, MD       And   LORazepam  (ATIVAN ) injection 2 mg  2 mg Intramuscular TID PRN Jadapalle, Sree, MD       haloperidol  lactate (HALDOL ) injection 10 mg  10 mg Intramuscular TID PRN Jadapalle, Sree, MD       And   diphenhydrAMINE  (BENADRYL ) injection 50 mg  50 mg Intramuscular TID PRN Jadapalle, Sree, MD       And   LORazepam  (ATIVAN ) injection 2 mg  2 mg Intramuscular TID PRN Jadapalle, Sree, MD       insulin  aspart (novoLOG ) injection 0-15 Units  0-15 Units Subcutaneous TID WC Montague, Crystal J, NP   2 Units at 01/13/24 0809   insulin  aspart (novoLOG ) injection 0-5 Units  0-5 Units Subcutaneous QHS Montague, Crystal J, NP       magnesium  hydroxide (MILK OF MAGNESIA) suspension 30 mL  30 mL Oral Daily PRN Montague, Crystal J, NP       nicotine  (NICODERM CQ  - dosed in mg/24 hours) patch 14 mg  14 mg Transdermal Q0600 Jadapalle, Sree, MD   14 mg at 01/12/24 0818   nicotine  polacrilex (NICORETTE ) gum 2 mg  2 mg Oral PRN  Jadapalle, Sree, MD       OLANZapine  (ZYPREXA ) tablet 10 mg  10 mg Oral Daily Cleotilde Hoy HERO, NP   10 mg at 01/13/24 9191   ondansetron  (ZOFRAN ) tablet 4 mg  4 mg Oral Q6H PRN Montague, Crystal J, NP       Or   ondansetron  (ZOFRAN ) injection 4 mg  4 mg Intravenous Q6H PRN Montague, Crystal J, NP       traZODone  (DESYREL ) tablet 25 mg  25 mg Oral QHS PRN Montague, Crystal J, NP   25 mg at 01/12/24 2057   PTA Medications: Medications Prior to Admission  Medication Sig Dispense Refill Last Dose/Taking   albuterol  (VENTOLIN  HFA) 108 (90 Base) MCG/ACT inhaler Inhale 2 puffs into the lungs every 4 (four) hours as needed. 18 g 0    atenolol  (TENORMIN ) 50 MG tablet Take 1 tablet (50 mg total) by mouth daily. 30 tablet 1    gabapentin  (NEURONTIN ) 300 MG capsule Take 1 capsule (300 mg total) by mouth 3 (three) times daily. 90 capsule 1    LYBALVI  10-10 MG TABS Take 1 tablet by mouth daily.      metFORMIN  (GLUCOPHAGE ) 500 MG tablet Take 1 tablet (  500 mg total) by mouth 2 (two) times daily with a meal. 60 tablet 1    Spacer/Aero-Holding Chambers (AEROCHAMBER MV) inhaler Use as instructed 1 each 2    sucralfate  (CARAFATE ) 1 g tablet Take 1 g by mouth 4 (four) times daily.       Patient Stressors: Substance abuse   Other: Impulsivity, health concerns    Patient Strengths: Supportive family/friends   Treatment Modalities: Medication Management, Group therapy, Case management,  1 to 1 session with clinician, Psychoeducation, Recreational therapy.   Physician Treatment Plan for Primary Diagnosis: Generalized anxiety disorder with panic attacks Long Term Goal(s): Improvement in symptoms so as ready for discharge   Short Term Goals: Ability to identify changes in lifestyle to reduce recurrence of condition will improve  Medication Management: Evaluate patient's response, side effects, and tolerance of medication regimen.  Therapeutic Interventions: 1 to 1 sessions, Unit Group sessions and  Medication administration.  Evaluation of Outcomes: Not Met  Physician Treatment Plan for Secondary Diagnosis: Principal Problem:   Generalized anxiety disorder with panic attacks  Long Term Goal(s): Improvement in symptoms so as ready for discharge   Short Term Goals: Ability to identify changes in lifestyle to reduce recurrence of condition will improve     Medication Management: Evaluate patient's response, side effects, and tolerance of medication regimen.  Therapeutic Interventions: 1 to 1 sessions, Unit Group sessions and Medication administration.  Evaluation of Outcomes: Not Met   RN Treatment Plan for Primary Diagnosis: Generalized anxiety disorder with panic attacks Long Term Goal(s): Knowledge of disease and therapeutic regimen to maintain health will improve  Short Term Goals: Ability to verbalize frustration and anger appropriately will improve, Ability to demonstrate self-control, Ability to participate in decision making will improve, Ability to verbalize feelings will improve, Ability to disclose and discuss suicidal ideas, and Ability to identify and develop effective coping behaviors will improve  Medication Management: RN will administer medications as ordered by provider, will assess and evaluate patient's response and provide education to patient for prescribed medication. RN will report any adverse and/or side effects to prescribing provider.  Therapeutic Interventions: 1 on 1 counseling sessions, Psychoeducation, Medication administration, Evaluate responses to treatment, Monitor vital signs and CBGs as ordered, Perform/monitor CIWA, COWS, AIMS and Fall Risk screenings as ordered, Perform wound care treatments as ordered.  Evaluation of Outcomes: Not Met   LCSW Treatment Plan for Primary Diagnosis: Generalized anxiety disorder with panic attacks Long Term Goal(s): Safe transition to appropriate next level of care at discharge, Engage patient in therapeutic group  addressing interpersonal concerns.  Short Term Goals: Engage patient in aftercare planning with referrals and resources, Increase social support, Increase ability to appropriately verbalize feelings, Increase emotional regulation, Facilitate acceptance of mental health diagnosis and concerns, Facilitate patient progression through stages of change regarding substance use diagnoses and concerns, Identify triggers associated with mental health/substance abuse issues, and Increase skills for wellness and recovery  Therapeutic Interventions: Assess for all discharge needs, 1 to 1 time with Social worker, Explore available resources and support systems, Assess for adequacy in community support network, Educate family and significant other(s) on suicide prevention, Complete Psychosocial Assessment, Interpersonal group therapy.  Evaluation of Outcomes: Not Met   Progress in Treatment: Attending groups: Yes. Participating in groups: Yes. Taking medication as prescribed: Yes. Toleration medication: Yes. Family/Significant other contact made: Yes, individual(s) contacted:  Education Completed; Donny Mace, mom, [phone],  has been identified by the patient as the family member/significant other with whom the patient will  be residing, and identified as the person(s) who will aid the patient in the event of a mental health crisis (suicidal ideations/suicide attempt).  With written consent from the patient, the family member/significant other has been provided the following suicide prevention education, prior to the and/or following the discharge of the patient Patient understands diagnosis: Yes. Discussing patient identified problems/goals with staff: Yes. Medical problems stabilized or resolved: Yes. Denies suicidal/homicidal ideation: Yes. Issues/concerns per patient self-inventory: No. Other: None  New problem(s) identified: No, Describe:  None  New Short Term/Long Term Goal(s):detox,  elimination of symptoms of psychosis, medication management for mood stabilization; elimination of SI thoughts; development of comprehensive mental wellness/sobriety plan.    Patient Goals:  Try to get my medications straight.  Discharge Plan or Barriers: CSW to assist with the development of appropriate discharge plan.    Reason for Continuation of Hospitalization: Anxiety Depression Suicidal ideation  Estimated Length of Stay: 1-7 days.   Last 3 [place] Suicide Severity Risk Score: Flowsheet Row Admission (Current) from 01/11/2024 in [facility] ED to Hosp-Admission (Discharged) from 01/08/2024 in [facility] to Hosp-Admission (Discharged) from 12/28/2023 in [facility]  C-SSRS RISK CATEGORY No Risk No Risk No Risk    Last PHQ 2/9 Scores:     No data to display          Scribe for Treatment Team: Atreus Hasz M Denyla Cortese, KEN 01/13/2024 12:20 PM

## 2024-01-13 NOTE — Plan of Care (Signed)
 Problem: Education: Goal: Knowledge of [facility] Education information/materials will improve Outcome: Progressing Goal: Emotional status will improve Outcome: Progressing Goal: Mental status will improve Outcome: Progressing Goal: Verbalization of understanding the information provided will improve Outcome: Progressing   Problem: Activity: Goal: Interest or engagement in activities will improve Outcome: Progressing Goal: Sleeping patterns will improve Outcome: Progressing   Problem: Coping: Goal: Ability to verbalize frustrations and anger appropriately will improve Outcome: Progressing Goal: Ability to demonstrate self-control will improve Outcome: Progressing   Problem: Health Behavior/Discharge Planning: Goal: Identification of resources available to assist in meeting health care needs will improve Outcome: Progressing Goal: Compliance with treatment plan for underlying cause of condition will improve Outcome: Progressing   Problem: Physical Regulation: Goal: Ability to maintain clinical measurements within normal limits will improve Outcome: Progressing   Problem: Safety: Goal: Periods of time without injury will increase Outcome: Progressing   Problem: Education: Goal: Ability to state activities that reduce stress will improve Outcome: Progressing   Problem: Coping: Goal: Ability to identify and develop effective coping behavior will improve Outcome: Progressing   Problem: Self-Concept: Goal: Ability to identify factors that promote anxiety will improve Outcome: Progressing Goal: Level of anxiety will decrease Outcome: Progressing Goal: Ability to modify response to factors that promote anxiety will improve Outcome: Progressing   Problem: Education: Goal: Utilization of techniques to improve thought processes will improve Outcome: Progressing Goal: Knowledge of the prescribed therapeutic regimen will improve Outcome: Progressing   Problem:  Activity: Goal: Interest or engagement in leisure activities will improve Outcome: Progressing Goal: Imbalance in normal sleep/wake cycle will improve Outcome: Progressing   Problem: Coping: Goal: Coping ability will improve Outcome: Progressing Goal: Will verbalize feelings Outcome: Progressing   Problem: Health Behavior/Discharge Planning: Goal: Ability to make decisions will improve Outcome: Progressing Goal: Compliance with therapeutic regimen will improve Outcome: Progressing   Problem: Role Relationship: Goal: Will demonstrate positive changes in social behaviors and relationships Outcome: Progressing   Problem: Safety: Goal: Ability to disclose and discuss suicidal ideas will improve Outcome: Progressing Goal: Ability to identify and utilize support systems that promote safety will improve Outcome: Progressing   Problem: Self-Concept: Goal: Will verbalize positive feelings about self Outcome: Progressing Goal: Level of anxiety will decrease Outcome: Progressing   Problem: Education: Goal: Knowledge of [place] [facility] Education information/materials will improve Outcome: Progressing Goal: Emotional status will improve Outcome: Progressing Goal: Mental status will improve Outcome: Progressing Goal: Verbalization of understanding the information provided will improve Outcome: Progressing   Problem: Activity: Goal: Interest or engagement in activities will improve Outcome: Progressing Goal: Sleeping patterns will improve Outcome: Progressing   Problem: Coping: Goal: Ability to verbalize frustrations and anger appropriately will improve Outcome: Progressing Goal: Ability to demonstrate self-control will improve Outcome: Progressing   Problem: Health Behavior/Discharge Planning: Goal: Identification of resources available to assist in meeting health care needs will improve Outcome: Progressing Goal: Compliance with treatment plan for underlying cause of  condition will improve Outcome: Progressing   Problem: Physical Regulation: Goal: Ability to maintain clinical measurements within normal limits will improve Outcome: Progressing   Problem: Safety: Goal: Periods of time without injury will increase Outcome: Progressing   Problem: Education: Goal: Ability to make informed decisions regarding treatment will improve Outcome: Progressing   Problem: Coping: Goal: Coping ability will improve Outcome: Progressing   Problem: Health Behavior/Discharge Planning: Goal: Identification of resources available to assist in meeting health care needs will improve Outcome: Progressing   Problem: Medication: Goal: Compliance with prescribed  medication regimen will improve Outcome: Progressing   Problem: Self-Concept: Goal: Ability to disclose and discuss suicidal ideas will improve Outcome: Progressing Goal: Will verbalize positive feelings about self Outcome: Progressing Note: Patient is on track. Patient will maintain adherence and avoid flare triggers

## 2024-01-13 NOTE — Progress Notes (Signed)
   01/13/24 2300  Psych Admission Type (Psych Patients Only)  Admission Status Involuntary  Psychosocial Assessment  Patient Complaints Depression  Eye Contact Fair  Facial Expression Sad  Affect Sad  Speech Logical/coherent  Interaction Minimal  Motor Activity Slow  Appearance/Hygiene Unremarkable  Behavior Characteristics Appropriate to situation  Mood Sad  Thought Process  Coherency WDL  Content WDL  Delusions None reported or observed  Perception WDL  Hallucination None reported or observed  Judgment WDL  Confusion WDL  Danger to Self  Current suicidal ideation? Denies  Agreement Not to Harm Self Yes  Description of Agreement Verbalized  Danger to Others  Danger to Others None reported or observed

## 2024-01-13 NOTE — Progress Notes (Signed)
 [facility] MD Progress Note  01/13/2024 10:54 PM Ayushi Pla  MRN:  [id number]  Sherel Fennell is a 43 y.o. female with medical history significant for COPD, anxiety, depression, type 2 diabetes mellitus, PTSD and migraine, who presented with acute onset of epigastric abdominal pain as well as intractable nausea and vomiting that started today with associated diarrhea.   43 year old female with medical history significant for COPD, anxiety, depression, type 2 diabetes mellitus, PTSD, and migraine, who presented with acute onset of epigastric abdominal pain as well as intractable nausea and vomiting that started today with associated diarrhea. The patient had a cholecystectomy on 12/28/2023. She is now seen for initial psychiatric evaluation.  Subjective:  Chart reviewed, case discussed in multidisciplinary meeting, patient seen during rounds.  On interview patient is noted to be resting in bed.  She offers no complaints.  She wanted to know about her discharge.  Provider and patient's aunt discussed her stressors, her marijuana use on daily basis, poorly controlled depression and anxiety.  Patient reports taking Zyprexa  daily but has not been on any medication for anxiety.  Provided discussed trying Cymbalta  to help her with her anxiety and chronic pain.  Patient denies SI/HI/plan and denies hallucinations.  Patient reports that she had cholecystectomy and when she started throwing up and felt sick she panicked in the emergency room and took extra Klonopin .  Patient was educated about the lethality of the overdose and the future hesitation in prescribing her controlled substances.  Patient expressed her understanding.   Sleep: Fair  Appetite:  Fair  Past Psychiatric History: see h&P Family History:  Family History  Problem Relation Age of Onset   Osteopenia Mother    Heart failure Mother    Osteoarthritis Mother    Depression Mother    Diabetes Father    Social History:   Social History   Substance and Sexual Activity  Alcohol Use Not Currently     Social History   Substance and Sexual Activity  Drug Use Yes   Frequency: 3.0 times per week   Types: Marijuana    Social History   Socioeconomic History   Marital status: Single    Spouse name: Not on file   Number of children: Not on file   Years of education: Not on file   Highest education level: Not on file  Occupational History   Not on file  Tobacco Use   Smoking status: Every Day    Current packs/day: 1.00    Average packs/day: 1 pack/day for 21.6 years (21.6 ttl pk-yrs)    Types: Cigarettes    Start date: 2004   Smokeless tobacco: Never  Vaping Use   Vaping status: Never Used  Substance and Sexual Activity   Alcohol use: Not Currently   Drug use: Yes    Frequency: 3.0 times per week    Types: Marijuana   Sexual activity: Not Currently    Birth control/protection: None  Other Topics Concern   Not on file  Social History Narrative   Not on file   Social Drivers of Health   Financial Resource Strain: Medium Risk (03/08/2020)   Received from [facility]   Overall Financial Resource Strain (CARDIA)    Difficulty of Paying Living Expenses: Somewhat hard  Food Insecurity: No Food Insecurity (01/11/2024)   Hunger Vital Sign    Worried About Running Out of Food in the Last Year: Never true    Ran Out of Food in the Last Year: Never  true  Transportation Needs: No Transportation Needs (01/11/2024)   PRAPARE - Administrator, Civil Service (Medical): No    Lack of Transportation (Non-Medical): No  Physical Activity: Insufficiently Active (05/02/2021)   Received from [facility]   Exercise Vital Sign    On average, how many days per week do you engage in moderate to strenuous exercise (like a brisk walk)?: 4 days    On average, how many minutes do you engage in exercise at this level?: 30 min  Stress: Not on file  Social Connections: Not on file   Past Medical  History:  Past Medical History:  Diagnosis Date   Anxiety    Arthritis    COPD (chronic obstructive pulmonary disease) (HCC)    Depression    Diabetes mellitus without complication (HCC)    Mental health disorder    Migraines     Past Surgical History:  Procedure Laterality Date   CHOLECYSTECTOMY     MOUTH SURGERY      Current Medications: Current Facility-Administered Medications  Medication Dose Route Frequency Provider Last Rate Last Admin   acetaminophen  (TYLENOL ) tablet 650 mg  650 mg Oral Q6H PRN Montague, Crystal J, NP       Or   acetaminophen  (TYLENOL ) suppository 650 mg  650 mg Rectal Q6H PRN Montague, Crystal J, NP       haloperidol  (HALDOL ) tablet 5 mg  5 mg Oral TID PRN Makaela Cando, MD       And   diphenhydrAMINE  (BENADRYL ) capsule 50 mg  50 mg Oral TID PRN Lealand Elting, MD       haloperidol  lactate (HALDOL ) injection 5 mg  5 mg Intramuscular TID PRN Kasyn Stouffer, MD       And   diphenhydrAMINE  (BENADRYL ) injection 50 mg  50 mg Intramuscular TID PRN Shahidah Nesbitt, MD       And   LORazepam  (ATIVAN ) injection 2 mg  2 mg Intramuscular TID PRN Sheanna Dail, MD       haloperidol  lactate (HALDOL ) injection 10 mg  10 mg Intramuscular TID PRN Donnelly Mellow, MD       And   diphenhydrAMINE  (BENADRYL ) injection 50 mg  50 mg Intramuscular TID PRN Hetvi Shawhan, MD       And   LORazepam  (ATIVAN ) injection 2 mg  2 mg Intramuscular TID PRN Delano Scardino, MD       [START ON 01/14/2024] DULoxetine  (CYMBALTA ) DR capsule 30 mg  30 mg Oral Daily Timmy Bubeck, MD       insulin  aspart (novoLOG ) injection 0-15 Units  0-15 Units Subcutaneous TID WC Montague, Crystal J, NP   2 Units at 01/13/24 0809   insulin  aspart (novoLOG ) injection 0-5 Units  0-5 Units Subcutaneous QHS Montague, Crystal J, NP       magnesium  hydroxide (MILK OF MAGNESIA) suspension 30 mL  30 mL Oral Daily PRN Montague, Crystal J, NP       nicotine  (NICODERM CQ  - dosed in mg/24 hours) patch 14 mg   14 mg Transdermal Q0600 Keaun Schnabel, MD   14 mg at 01/12/24 0818   nicotine  polacrilex (NICORETTE ) gum 2 mg  2 mg Oral PRN Lucill Mauck, MD       OLANZapine  (ZYPREXA ) tablet 10 mg  10 mg Oral Daily Cleotilde Hoy HERO, NP   10 mg at 01/13/24 9191   ondansetron  (ZOFRAN ) tablet 4 mg  4 mg Oral Q6H PRN Montague, Crystal J, NP  Or   ondansetron  (ZOFRAN ) injection 4 mg  4 mg Intravenous Q6H PRN Montague, Crystal J, NP       traZODone  (DESYREL ) tablet 25 mg  25 mg Oral QHS PRN Montague, Crystal J, NP   25 mg at 01/13/24 2048    Lab Results:  Results for orders placed or performed during the hospital encounter of 01/11/24 (from the past 48 hours)  Glucose, capillary     Status: Abnormal   Collection Time: 01/12/24  7:44 AM  Result Value Ref Range   Glucose-Capillary 123 (H) 70 - 99 mg/dL    Comment: Glucose reference range applies only to samples taken after fasting for at least 8 hours.  Glucose, capillary     Status: Abnormal   Collection Time: 01/12/24 12:05 PM  Result Value Ref Range   Glucose-Capillary 120 (H) 70 - 99 mg/dL    Comment: Glucose reference range applies only to samples taken after fasting for at least 8 hours.  Glucose, capillary     Status: Abnormal   Collection Time: 01/12/24  4:31 PM  Result Value Ref Range   Glucose-Capillary 109 (H) 70 - 99 mg/dL    Comment: Glucose reference range applies only to samples taken after fasting for at least 8 hours.  Glucose, capillary     Status: Abnormal   Collection Time: 01/12/24  8:38 PM  Result Value Ref Range   Glucose-Capillary 112 (H) 70 - 99 mg/dL    Comment: Glucose reference range applies only to samples taken after fasting for at least 8 hours.  Glucose, capillary     Status: Abnormal   Collection Time: 01/13/24  6:27 AM  Result Value Ref Range   Glucose-Capillary 126 (H) 70 - 99 mg/dL    Comment: Glucose reference range applies only to samples taken after fasting for at least 8 hours.  Lipid panel     Status:  Abnormal   Collection Time: 01/13/24  7:07 AM  Result Value Ref Range   Cholesterol 279 (H) 0 - 200 mg/dL   Triglycerides 745 (H) <150 mg/dL   HDL 36 (L) >59 mg/dL   Total CHOL/HDL Ratio 7.8 RATIO   VLDL 51 (H) 0 - 40 mg/dL   LDL Cholesterol 807 (H) 0 - 99 mg/dL    Comment:        Total Cholesterol/HDL:CHD Risk Coronary Heart Disease Risk Table                     Men   Women  1/2 Average Risk   3.4   3.3  Average Risk       5.0   4.4  2 X Average Risk   9.6   7.1  3 X Average Risk  23.4   11.0        Use the calculated Patient Ratio above and the CHD Risk Table to determine the patient's CHD Risk.        ATP III CLASSIFICATION (LDL):  <100     mg/dL   Optimal  899-870  mg/dL   Near or Above                    Optimal  130-159  mg/dL   Borderline  839-810  mg/dL   High  >809     mg/dL   Very High Performed at [facility], [address] [place], [place] [address]   Glucose, capillary     Status: Abnormal  Collection Time: 01/13/24 11:40 AM  Result Value Ref Range   Glucose-Capillary 109 (H) 70 - 99 mg/dL    Comment: Glucose reference range applies only to samples taken after fasting for at least 8 hours.  Glucose, capillary     Status: Abnormal   Collection Time: 01/13/24  4:46 PM  Result Value Ref Range   Glucose-Capillary 118 (H) 70 - 99 mg/dL    Comment: Glucose reference range applies only to samples taken after fasting for at least 8 hours.  Glucose, capillary     Status: Abnormal   Collection Time: 01/13/24  8:47 PM  Result Value Ref Range   Glucose-Capillary 116 (H) 70 - 99 mg/dL    Comment: Glucose reference range applies only to samples taken after fasting for at least 8 hours.    Blood Alcohol level:  Lab Results  Component Value Date   [facility] <15 01/09/2024   ETH <10 01/11/2023    Metabolic Disorder Labs: Lab Results  Component Value Date   HGBA1C 6.7 (H) 01/08/2024   MPG 146 01/08/2024   MPG 137 01/11/2023   No results found for:  PROLACTIN Lab Results  Component Value Date   CHOL 279 (H) 01/13/2024   TRIG 254 (H) 01/13/2024   HDL 36 (L) 01/13/2024   CHOLHDL 7.8 01/13/2024   VLDL 51 (H) 01/13/2024   LDLCALC 192 (H) 01/13/2024   LDLCALC 196 (H) 01/11/2023    Physical Findings: AIMS:  , ,  ,  ,    CIWA:    COWS:      Psychiatric Specialty Exam:  Presentation  General Appearance: Appropriate for Environment; Casual  Eye Contact:Fair  Speech:Clear and Coherent  Speech Volume:Normal    Mood and Affect  Mood:Anxious  Affect:Appropriate   Thought Process  Thought Processes:Coherent  Descriptions of Associations:Intact  Orientation:Full (Time, Place and Person)  Thought Content:Logical  Hallucinations:Hallucinations: None  Ideas of Reference:None  Suicidal Thoughts:Suicidal Thoughts: No  Homicidal Thoughts:Homicidal Thoughts: No   Sensorium  Memory:Immediate Fair; Recent Fair; Remote Fair  Judgment:Fair  Insight:Fair   Executive Functions  Concentration:Fair  Attention Span:Fair  Recall:Fair  Fund of Knowledge:Fair  Language:Fair   Psychomotor Activity  Psychomotor Activity:Psychomotor Activity: Normal  Musculoskeletal: Strength & Muscle Tone: within normal limits Gait & Station: normal Assets  Assets:Communication Skills; Housing    Physical Exam: Physical Exam Vitals and nursing note reviewed.    ROS Blood pressure 120/87, pulse 70, temperature 97.8 F (36.6 C), resp. rate 20, height 4' 11 (1.499 m), weight 79.4 kg, last menstrual period 12/28/2023, SpO2 99%. Body mass index is 35.35 kg/m.  Diagnosis: Principal Problem:   Generalized anxiety disorder with panic attacks   PLAN: Safety and Monitoring:  -- Voluntary admission to inpatient psychiatric unit for safety, stabilization and treatment  -- Daily contact with patient to assess and evaluate symptoms and progress in treatment  -- Patient's case to be discussed in multi-disciplinary team  meeting  -- Observation Level : q15 minute checks  -- Vital signs:  q12 hours  -- Precautions: suicide, elopement, and assault -- Encouraged patient to participate in unit milieu and in scheduled group therapies  2. Psychiatric Diagnoses and Treatment:    Zyprexa  10 mg at bedtime Added cymbalta  30 mg daily to help with anxiety     3. Medical Issues Being Addressed:     4. Discharge Planning:   -- Social work and case management to assist with discharge planning and identification of hospital follow-up needs prior to discharge  --  Estimated LOS: 3-4 days  Josemiguel Gries, MD 01/13/2024, 10:54 PM

## 2024-01-13 NOTE — Group Note (Signed)
 Date:  01/13/2024 Time:  10:24 AM  Group Topic/Focus:  Goals Group:   The focus of this group is to help patients establish daily goals to achieve during treatment and discuss how the patient can incorporate goal setting into their daily lives to aide in recovery.    Participation Level:  Active  Participation Quality:  Appropriate  Affect:  Appropriate  Cognitive:  Alert  Insight: Appropriate  Engagement in Group:  Engaged  Modes of Intervention:  Activity, Discussion, and Education  Additional Comments:     Skippy LITTIE Bennett 01/13/2024, 10:24 AM

## 2024-01-13 NOTE — Group Note (Signed)
 [facility] LCSW Group Therapy Note    Group Date: 01/13/2024 Start Time: 1300 End Time: 1400  Type of Therapy and Topic:  Group Therapy:  Overcoming Obstacles  Participation Level:  [facility] PARTICIPATION LEVEL: None  Mood:  Description of Group:   In this group patients will be encouraged to explore what they see as obstacles to their own wellness and recovery. They will be guided to discuss their thoughts, feelings, and behaviors related to these obstacles. The group will process together ways to cope with barriers, with attention given to specific choices patients can make. Each patient will be challenged to identify changes they are motivated to make in order to overcome their obstacles. This group will be process-oriented, with patients participating in exploration of their own experiences as well as giving and receiving support and challenge from other group members.  Therapeutic Goals: 1. Patient will identify personal and current obstacles as they relate to admission. 2. Patient will identify barriers that currently interfere with their wellness or overcoming obstacles.  3. Patient will identify feelings, thought process and behaviors related to these barriers. 4. Patient will identify two changes they are willing to make to overcome these obstacles:    Summary of Patient Progress Patient was present however did not engage in discussion.   Therapeutic Modalities:   Cognitive Behavioral Therapy Solution Focused Therapy Motivational Interviewing Relapse Prevention Therapy   Sherryle JINNY Margo, LCSW

## 2024-01-13 NOTE — Evaluation (Signed)
 Occupational Therapy Evaluation Patient Details Name: Natalie Rose MRN: [id number] DOB: May 16, 1981 Today's Date: 01/13/2024   History of Present Illness   43 y.o. female with medical history significant for COPD, anxiety, depression, type 2 diabetes mellitus, PTSD and migraine, who presented with acute onset of epigastric abdominal pain as well as intractable nausea and vomiting that started today with associated diarrhea.    43 year old female with medical history significant for COPD, anxiety, depression, type 2 diabetes mellitus, PTSD, and migraine, who presented with acute onset of epigastric abdominal pain as well as intractable nausea and vomiting that started today with associated diarrhea. The patient had a cholecystectomy on 12/28/2023. She is now seen for initial psychiatric evaluation     Clinical Impressions Pt located on unit and ambulates back to her room to talk to therapist this session. Her mood seems low but pt is appropriate with her conversation. Pt reports living at home alone and being Ind in self care and mobility. She has several dogs she care's for that help her to cope with her anxiety. Her mother lives nearby but cares for another family member. Natalie Rose will often run errands to help her mother as well. Pt reports several coping strategies she typically relies on but at time of event everything felt like it was too much. She does fell that she is getting better while at hospital.Pt has been independent with all self care tasks and mobility at this time. Pt does not need skilled acute OT intervention. OT does recommend and speak to pt about use of pillbox for medications at discharge.          Equipment Recommendations   Other (comment) (recommended pt utlize pillbox for medications)      Precautions/Restrictions   Precautions Precautions: None     Mobility Bed Mobility Overal bed mobility: Independent                  Transfers Overall  transfer level: Independent                        Balance Overall balance assessment: Independent                                         ADL either performed or assessed with clinical judgement   ADL Overall ADL's : Independent                                             Vision Patient Visual Report: No change from baseline              Pertinent Vitals/Pain Pain Assessment Pain Assessment: No/denies pain     Extremity/Trunk Assessment Upper Extremity Assessment Upper Extremity Assessment: Overall WFL for tasks assessed   Lower Extremity Assessment Lower Extremity Assessment: Overall WFL for tasks assessed       Communication Communication Communication: No apparent difficulties   Cognition Arousal: Alert Behavior During Therapy: WFL for tasks assessed/performed Cognition: No apparent impairments                               Following commands: Intact  Home Living Family/patient expects to be discharged to:: Private residence Living Arrangements: Alone Available Help at Discharge: Family;Friend(s);Available PRN/intermittently Type of Home: Apartment (2nd story apt) Home Access: Stairs to enter [employer] of Steps: 1 flight Entrance Stairs-Rails: Right;Left Home Layout: One level     Bathroom Shower/Tub: Chief Strategy Officer: Standard Bathroom Accessibility: Yes   Home Equipment: None          Prior Functioning/Environment Prior Level of Function : Independent/Modified Independent;Driving                            OT Goals(Current goals can be found in the care plan section)   Acute Rehab OT Goals Patient Stated Goal: to go home to her dogs OT Goal Formulation: With patient Time For Goal Achievement: 01/13/24 Potential to Achieve Goals: Fair      AM-PAC OT 6 Clicks Daily Activity     Outcome Measure Help from another  person eating meals?: None Help from another person taking care of personal grooming?: None Help from another person toileting, which includes using toliet, bedpan, or urinal?: None Help from another person bathing (including washing, rinsing, drying)?: None Help from another person to put on and taking off regular upper body clothing?: None Help from another person to put on and taking off regular lower body clothing?: None 6 Click Score: 24   End of Session Nurse Communication: Mobility status  Activity Tolerance: Patient tolerated treatment well Patient left: Other (comment) (ambulated back to dayroom)                   Time: 8982-8958 OT Time Calculation (min): 24 min Charges:  OT General Charges $OT Visit: 1 Visit OT Evaluation $OT Eval Low Complexity: 1 Low OT Treatments $Therapeutic Activity: 8-22 mins  Izetta Claude, MS, OTR/L , CBIS ascom [phone]  01/13/24, 1:03 PM

## 2024-01-13 NOTE — Progress Notes (Addendum)
 Nutrition Brief Note  Patient identified on the Malnutrition Screening Tool (MST) Report  Wt Readings from Last 15 Encounters:  01/11/24 79.4 kg  01/08/24 81.6 kg  01/07/24 81.7 kg  12/28/23 81.6 kg  05/25/23 81.6 kg  01/12/23 80.7 kg  01/11/23 75 kg  01/10/23 74.8 kg  09/15/22 75.1 kg  09/14/22 75.8 kg  06/24/22 75.8 kg  06/24/22 80.7 kg  06/09/21 90.7 kg  03/08/21 90.7 kg  12/15/20 90.7 kg   Pt with medical history significant for COPD, anxiety, depression, type 2 diabetes mellitus, PTSD and migraine, who presented with acute onset of epigastric abdominal pain as well as intractable nausea and vomiting that started today with associated diarrhea.    Lab Results  Component Value Date   HGBA1C 6.7 (H) 01/08/2024   PTA DM medications are 500 mg metformin  BID.   Labs reviewed: CBGS: 95-144 (inpatient orders for glycemic control are 0-15 units insulin  aspart TID with meals and 0-5 units insulin  aspart daily at bedtime).  Tox screen positive for tricyclic and cannabinoids.  Body mass index is 35.35 kg/m. Patient meets criteria for obesity, class II based on current BMI. Obesity is a complex, chronic medical condition that is optimally managed by a multidisciplinary care team. Weight loss is not an ideal goal for an acute inpatient hospitalization. However, if further work-up for obesity is warranted, consider outpatient referral to [facility].    Current diet order is carb modified, patient is consuming approximately n/a% of meals at this time. Labs and medications reviewed.   No nutrition interventions warranted at this time. If nutrition issues arise, please consult RD.   Margery ORN, RD, LDN, CDCES Registered Dietitian III Certified Diabetes Care and Education Specialist If unable to reach this RD, please use RD Inpatient group chat on secure chat between hours of 8am-4 pm daily

## 2024-01-13 NOTE — Group Note (Signed)
 Date:  01/13/2024 Time:  9:17 PM  Group Topic/Focus:  Personal Choices and Values:   The focus of this group is to help patients assess and explore the importance of values in their lives, how their values affect their decisions, how they express their values and what opposes their expression.    Participation Level:  Active  Participation Quality:  Appropriate  Affect:  Appropriate  Cognitive:  Appropriate  Insight: Appropriate  Engagement in Group:  Engaged  Modes of Intervention:  Discussion  Additional Comments:    Elliot Meldrum L 01/13/2024, 9:17 PM

## 2024-01-14 DIAGNOSIS — F411 Generalized anxiety disorder: Secondary | ICD-10-CM | POA: Diagnosis not present

## 2024-01-14 DIAGNOSIS — F410 Panic disorder [episodic paroxysmal anxiety] without agoraphobia: Secondary | ICD-10-CM | POA: Diagnosis not present

## 2024-01-14 LAB — GLUCOSE, CAPILLARY
Glucose-Capillary: 128 mg/dL — ABNORMAL HIGH (ref 70–99)
Glucose-Capillary: 116 mg/dL — ABNORMAL HIGH (ref 70–99)
Glucose-Capillary: 134 mg/dL — ABNORMAL HIGH (ref 70–99)
Glucose-Capillary: 110 mg/dL — ABNORMAL HIGH (ref 70–99)

## 2024-01-14 NOTE — Group Note (Signed)
 [facility] LCSW Group Therapy Note   Group Date: 01/14/2024 Start Time: 1300 End Time: 1345  Type of Therapy/Topic:  Group Therapy:  Feelings about Diagnosis  Participation Level:  None    Description of Group:    This group will allow patients to explore their thoughts and feelings about diagnoses they have received. Patients will be guided to explore their level of understanding and acceptance of these diagnoses. Facilitator will encourage patients to process their thoughts and feelings about the reactions of others to their diagnosis, and will guide patients in identifying ways to discuss their diagnosis with significant others in their lives. This group will be process-oriented, with patients participating in exploration of their own experiences as well as giving and receiving support and challenge from other group members.   Therapeutic Goals: 1. Patient will demonstrate understanding of diagnosis as evidence by identifying two or more symptoms of the disorder:  2. Patient will be able to express two feelings regarding the diagnosis 3. Patient will demonstrate ability to communicate their needs through discussion and/or role plays  Summary of Patient Progress: Patient was present for the entirety of the group process. She participated in the icebreaker but not in the larger discussion. Pt appeared to attend to the conversation and could be seen nodding in agreement or raising hand for group questions.    Therapeutic Modalities:   Cognitive Behavioral Therapy Brief Therapy Feelings Identification    Nadara JONELLE Fam, LCSW

## 2024-01-14 NOTE — Group Note (Signed)
 Recreation Therapy Group Note   Group Topic:Coping Skills  Group Date: 01/14/2024 Start Time: 1530 End Time: 1620 Facilitators: Celestia Jeoffrey FORBES ARTICE, CTRS Location: Craft Room  Group Description: Coping A-Z. LRT and patients engage in a guided discussion on what coping skills are and gave specific examples. LRT passed out a handout labeled Coping A-Z with blank spaces beside each letter. LRT prompted patients to come up with a coping skill for each of the letters. LRT and patients went over the handout and gave ideas for each letter if anyone had any blanks left on their paper. Patients kept this handout with them that listed 26 different coping skills.   Goal Area(s) Addressed: Patients will be able to define "coping skills". Patient will identify new coping skills.  Patient will increase communication.   Affect/Mood: Appropriate and Flat   Participation Level: Moderate   Participation Quality: Independent   Behavior: Calm and Cooperative   Speech/Thought Process: Coherent   Insight: Fair   Judgement: Fair    Modes of Intervention: Education, Exploration, Worksheet, and Writing   Patient Response to Interventions:  Receptive   Education Outcome:  In group clarification offered    Clinical Observations/Individualized Feedback: Antoinett was active in their participation of session activities and group discussion. Pt identified call someone and hang out with a friend as coping skills.    Plan: Continue to engage patient in RT group sessions 2-3x/week.   Jeoffrey FORBES Celestia, LRT, CTRS 01/14/2024 4:54 PM

## 2024-01-14 NOTE — Group Note (Signed)
 Date:  01/14/2024 Time:  11:31 PM  Group Topic/Focus:  Wrap-Up Group:   The focus of this group is to help patients review their daily goal of treatment and discuss progress on daily workbooks.    Participation Level:  Active  Participation Quality:  Appropriate and Attentive  Affect:  Appropriate  Cognitive:  Appropriate  Insight: Appropriate  Engagement in Group:  Limited  Modes of Intervention:  Discussion and Orientation  Additional Comments:     Natalie Rose Servant 01/14/2024, 11:31 PM

## 2024-01-14 NOTE — Group Note (Signed)
 Date:  01/14/2024 Time:  10:05 AM  Group Topic/Focus:  Goals Group:   The focus of this group is to help patients establish daily goals to achieve during treatment and discuss how the patient can incorporate goal setting into their daily lives to aide in recovery.    Participation Level:  Active  Participation Quality:  Appropriate  Affect:  Appropriate  Cognitive:  Appropriate  Insight: Appropriate  Engagement in Group:  Engaged  Modes of Intervention:  Discussion, Education, and Support  Additional Comments:    Natalie Rose 01/14/2024, 10:05 AM

## 2024-01-14 NOTE — Plan of Care (Signed)
  Problem: Education: Goal: Mental status will improve Outcome: Progressing   Problem: Activity: Goal: Interest or engagement in activities will improve Outcome: Progressing Goal: Sleeping patterns will improve Outcome: Progressing   Problem: Safety: Goal: Periods of time without injury will increase Outcome: Progressing

## 2024-01-14 NOTE — Plan of Care (Signed)
   Problem: Education: Goal: Emotional status will improve Outcome: Progressing Goal: Mental status will improve Outcome: Progressing

## 2024-01-14 NOTE — Progress Notes (Signed)
   01/14/24 1100  Psych Admission Type (Psych Patients Only)  Admission Status Involuntary  Psychosocial Assessment  Patient Complaints None  Eye Contact Fair  Facial Expression Flat  Affect Flat  Speech Logical/coherent  Interaction Minimal  Motor Activity Other (Comment) (appropriate for developmental age)  Appearance/Hygiene Unremarkable  Behavior Characteristics Cooperative  Mood Pleasant;Depressed (Patient writes her goal for today is medication management. She writes she will take my meds and cooperative with the doctor to help  meet that goal.)  Thought Process  Coherency WDL  Content WDL  Delusions None reported or observed  Perception WDL  Hallucination None reported or observed  Judgment WDL  Confusion WDL  Danger to Self  Current suicidal ideation? Denies  Danger to Others  Danger to Others None reported or observed

## 2024-01-14 NOTE — Progress Notes (Signed)
 [facility] MD Progress Note  01/14/2024 1:21 PM Verenise Moulin  MRN:  [id number]  Karem Farha is a 43 y.o. female with medical history significant for COPD, anxiety, depression, type 2 diabetes mellitus, PTSD and migraine, who presented with acute onset of epigastric abdominal pain as well as intractable nausea and vomiting that started today with associated diarrhea.   43 year old female with medical history significant for COPD, anxiety, depression, type 2 diabetes mellitus, PTSD, and migraine, who presented with acute onset of epigastric abdominal pain as well as intractable nausea and vomiting that started today with associated diarrhea. The patient had a cholecystectomy on 12/28/2023. She is now seen for initial psychiatric evaluation.  Subjective:  Chart reviewed, case discussed in multidisciplinary meeting, patient seen during rounds.  On interview patient is calm and cooperative.  She is taking her medications and reports that she is tolerating Cymbalta  fairly well.  She denies having any side effects to the medication.  She reports participating in groups.  She denies SI/HI/plan and denies any hallucinations.  Sleep: Fair  Appetite:  Fair  Past Psychiatric History: see h&P Family History:  Family History  Problem Relation Age of Onset   Osteopenia Mother    Heart failure Mother    Osteoarthritis Mother    Depression Mother    Diabetes Father    Social History:  Social History   Substance and Sexual Activity  Alcohol Use Not Currently     Social History   Substance and Sexual Activity  Drug Use Yes   Frequency: 3.0 times per week   Types: Marijuana    Social History   Socioeconomic History   Marital status: Single    Spouse name: Not on file   Number of children: Not on file   Years of education: Not on file   Highest education level: Not on file  Occupational History   Not on file  Tobacco Use   Smoking status: Every Day    Current packs/day:  1.00    Average packs/day: 1 pack/day for 21.6 years (21.6 ttl pk-yrs)    Types: Cigarettes    Start date: 2004   Smokeless tobacco: Never  Vaping Use   Vaping status: Never Used  Substance and Sexual Activity   Alcohol use: Not Currently   Drug use: Yes    Frequency: 3.0 times per week    Types: Marijuana   Sexual activity: Not Currently    Birth control/protection: None  Other Topics Concern   Not on file  Social History Narrative   Not on file   Social Drivers of Health   Financial Resource Strain: Medium Risk (03/08/2020)   Received from [facility]   Overall Financial Resource Strain (CARDIA)    Difficulty of Paying Living Expenses: Somewhat hard  Food Insecurity: No Food Insecurity (01/11/2024)   Hunger Vital Sign    Worried About Running Out of Food in the Last Year: Never true    Ran Out of Food in the Last Year: Never true  Transportation Needs: No Transportation Needs (01/11/2024)   PRAPARE - Administrator, Civil Service (Medical): No    Lack of Transportation (Non-Medical): No  Physical Activity: Insufficiently Active (05/02/2021)   Received from [facility]   Exercise Vital Sign    On average, how many days per week do you engage in moderate to strenuous exercise (like a brisk walk)?: 4 days    On average, how many minutes do you engage in  exercise at this level?: 30 min  Stress: Not on file  Social Connections: Not on file   Past Medical History:  Past Medical History:  Diagnosis Date   Anxiety    Arthritis    COPD (chronic obstructive pulmonary disease) (HCC)    Depression    Diabetes mellitus without complication (HCC)    Mental health disorder    Migraines     Past Surgical History:  Procedure Laterality Date   CHOLECYSTECTOMY     MOUTH SURGERY      Current Medications: Current Facility-Administered Medications  Medication Dose Route Frequency Provider Last Rate Last Admin   acetaminophen  (TYLENOL ) tablet 650 mg  650 mg Oral  Q6H PRN Montague, Crystal J, NP       Or   acetaminophen  (TYLENOL ) suppository 650 mg  650 mg Rectal Q6H PRN Montague, Crystal J, NP       haloperidol  (HALDOL ) tablet 5 mg  5 mg Oral TID PRN Aysa Larivee, MD       And   diphenhydrAMINE  (BENADRYL ) capsule 50 mg  50 mg Oral TID PRN Rochele Lueck, MD       haloperidol  lactate (HALDOL ) injection 5 mg  5 mg Intramuscular TID PRN Zykerria Tanton, MD       And   diphenhydrAMINE  (BENADRYL ) injection 50 mg  50 mg Intramuscular TID PRN Robb Sibal, MD       And   LORazepam  (ATIVAN ) injection 2 mg  2 mg Intramuscular TID PRN Farhana Fellows, MD       haloperidol  lactate (HALDOL ) injection 10 mg  10 mg Intramuscular TID PRN Ellary Casamento, MD       And   diphenhydrAMINE  (BENADRYL ) injection 50 mg  50 mg Intramuscular TID PRN Olanrewaju Osborn, MD       And   LORazepam  (ATIVAN ) injection 2 mg  2 mg Intramuscular TID PRN Maxie Slovacek, MD       DULoxetine  (CYMBALTA ) DR capsule 30 mg  30 mg Oral Daily Greig Altergott, MD   30 mg at 01/14/24 9173   insulin  aspart (novoLOG ) injection 0-15 Units  0-15 Units Subcutaneous TID WC Montague, Crystal J, NP   2 Units at 01/14/24 9173   insulin  aspart (novoLOG ) injection 0-5 Units  0-5 Units Subcutaneous QHS Montague, Crystal J, NP       magnesium  hydroxide (MILK OF MAGNESIA) suspension 30 mL  30 mL Oral Daily PRN Montague, Crystal J, NP       nicotine  polacrilex (NICORETTE ) gum 2 mg  2 mg Oral PRN Kamren Heskett, MD       OLANZapine  (ZYPREXA ) tablet 10 mg  10 mg Oral Daily Cleotilde Hoy HERO, NP   10 mg at 01/14/24 9173   ondansetron  (ZOFRAN ) tablet 4 mg  4 mg Oral Q6H PRN Montague, Crystal J, NP       Or   ondansetron  (ZOFRAN ) injection 4 mg  4 mg Intravenous Q6H PRN Montague, Crystal J, NP       traZODone  (DESYREL ) tablet 25 mg  25 mg Oral QHS PRN Montague, Crystal J, NP   25 mg at 01/13/24 2048    Lab Results:  Results for orders placed or performed during the hospital encounter of 01/11/24 (from  the past 48 hours)  Glucose, capillary     Status: Abnormal   Collection Time: 01/12/24  4:31 PM  Result Value Ref Range   Glucose-Capillary 109 (H) 70 - 99 mg/dL    Comment: Glucose reference range applies only to  samples taken after fasting for at least 8 hours.  Glucose, capillary     Status: Abnormal   Collection Time: 01/12/24  8:38 PM  Result Value Ref Range   Glucose-Capillary 112 (H) 70 - 99 mg/dL    Comment: Glucose reference range applies only to samples taken after fasting for at least 8 hours.  Glucose, capillary     Status: Abnormal   Collection Time: 01/13/24  6:27 AM  Result Value Ref Range   Glucose-Capillary 126 (H) 70 - 99 mg/dL    Comment: Glucose reference range applies only to samples taken after fasting for at least 8 hours.  Lipid panel     Status: Abnormal   Collection Time: 01/13/24  7:07 AM  Result Value Ref Range   Cholesterol 279 (H) 0 - 200 mg/dL   Triglycerides 745 (H) <150 mg/dL   HDL 36 (L) >59 mg/dL   Total CHOL/HDL Ratio 7.8 RATIO   VLDL 51 (H) 0 - 40 mg/dL   LDL Cholesterol 807 (H) 0 - 99 mg/dL    Comment:        Total Cholesterol/HDL:CHD Risk Coronary Heart Disease Risk Table                     Men   Women  1/2 Average Risk   3.4   3.3  Average Risk       5.0   4.4  2 X Average Risk   9.6   7.1  3 X Average Risk  23.4   11.0        Use the calculated Patient Ratio above and the CHD Risk Table to determine the patient's CHD Risk.        ATP III CLASSIFICATION (LDL):  <100     mg/dL   Optimal  899-870  mg/dL   Near or Above                    Optimal  130-159  mg/dL   Borderline  839-810  mg/dL   High  >809     mg/dL   Very High Performed at [facility], [address] [place], [place] [address]   Glucose, capillary     Status: Abnormal   Collection Time: 01/13/24 11:40 AM  Result Value Ref Range   Glucose-Capillary 109 (H) 70 - 99 mg/dL    Comment: Glucose reference range applies only to samples taken after fasting for  at least 8 hours.  Glucose, capillary     Status: Abnormal   Collection Time: 01/13/24  4:46 PM  Result Value Ref Range   Glucose-Capillary 118 (H) 70 - 99 mg/dL    Comment: Glucose reference range applies only to samples taken after fasting for at least 8 hours.  Glucose, capillary     Status: Abnormal   Collection Time: 01/13/24  8:47 PM  Result Value Ref Range   Glucose-Capillary 116 (H) 70 - 99 mg/dL    Comment: Glucose reference range applies only to samples taken after fasting for at least 8 hours.  Glucose, capillary     Status: Abnormal   Collection Time: 01/14/24  6:27 AM  Result Value Ref Range   Glucose-Capillary 128 (H) 70 - 99 mg/dL    Comment: Glucose reference range applies only to samples taken after fasting for at least 8 hours.  Glucose, capillary     Status: Abnormal   Collection Time: 01/14/24 11:38 AM  Result Value Ref Range  Glucose-Capillary 116 (H) 70 - 99 mg/dL    Comment: Glucose reference range applies only to samples taken after fasting for at least 8 hours.   Comment 1 Notify RN     Blood Alcohol level:  Lab Results  Component Value Date   [facility] <15 01/09/2024   ETH <10 01/11/2023    Metabolic Disorder Labs: Lab Results  Component Value Date   HGBA1C 6.7 (H) 01/08/2024   MPG 146 01/08/2024   MPG 137 01/11/2023   No results found for: PROLACTIN Lab Results  Component Value Date   CHOL 279 (H) 01/13/2024   TRIG 254 (H) 01/13/2024   HDL 36 (L) 01/13/2024   CHOLHDL 7.8 01/13/2024   VLDL 51 (H) 01/13/2024   LDLCALC 192 (H) 01/13/2024   LDLCALC 196 (H) 01/11/2023    Physical Findings: AIMS:  , ,  ,  ,    CIWA:    COWS:      Psychiatric Specialty Exam:  Presentation  General Appearance: Appropriate for Environment; Casual  Eye Contact:Fair  Speech:Clear and Coherent  Speech Volume:Normal    Mood and Affect  Mood:Anxious  Affect:Appropriate   Thought Process  Thought Processes:Coherent  Descriptions of  Associations:Intact  Orientation:Full (Time, Place and Person)  Thought Content:Logical  Hallucinations:Hallucinations: None  Ideas of Reference:None  Suicidal Thoughts:Suicidal Thoughts: No  Homicidal Thoughts:Homicidal Thoughts: No   Sensorium  Memory:Immediate Fair; Recent Fair; Remote Fair  Judgment:Fair  Insight:Fair   Executive Functions  Concentration:Fair  Attention Span:Fair  Recall:Fair  Fund of Knowledge:Fair  Language:Fair   Psychomotor Activity  Psychomotor Activity:Psychomotor Activity: Normal  Musculoskeletal: Strength & Muscle Tone: within normal limits Gait & Station: normal Assets  Assets:Communication Skills; Housing    Physical Exam: Physical Exam Vitals and nursing note reviewed.    ROS Blood pressure 104/76, pulse 72, temperature 97.8 F (36.6 C), resp. rate 17, height 4' 11 (1.499 m), weight 79.4 kg, last menstrual period 12/28/2023, SpO2 96%. Body mass index is 35.35 kg/m.  Diagnosis: Principal Problem:   Generalized anxiety disorder with panic attacks   PLAN: Safety and Monitoring:  -- Voluntary admission to inpatient psychiatric unit for safety, stabilization and treatment  -- Daily contact with patient to assess and evaluate symptoms and progress in treatment  -- Patient's case to be discussed in multi-disciplinary team meeting  -- Observation Level : q15 minute checks  -- Vital signs:  q12 hours  -- Precautions: suicide, elopement, and assault -- Encouraged patient to participate in unit milieu and in scheduled group therapies  2. Psychiatric Diagnoses and Treatment:    Zyprexa  10 mg at bedtime Added cymbalta  30 mg daily to help with anxiety     3. Medical Issues Being Addressed:     4. Discharge Planning:   -- Social work and case management to assist with discharge planning and identification of hospital follow-up needs prior to discharge  -- Estimated LOS: 3-4 days  Bo Rogue, MD 01/14/2024, 1:21  PM

## 2024-01-14 NOTE — Plan of Care (Signed)

## 2024-01-14 NOTE — Group Note (Signed)
 Recreation Therapy Group Note   Group Topic:General Recreation  Group Date: 01/14/2024 Start Time: 1000 End Time: 1110 Facilitators: Celestia Jeoffrey BRAVO, LRT, CTRS Location: Courtyard  Group Description: [employer]. LRT and patients played games of basketball, drew with chalk, and played corn hole while outside in the courtyard while getting fresh air and sunlight. Music was being played in the background. LRT and peers conversed about different games they have played before, what they do in their free time and anything else that is on their minds. LRT encouraged pts to drink water  after being outside, sweating and getting their heart rate up.  Goal Area(s) Addressed: Patient will build on frustration tolerance skills. Patients will partake in a competitive play game with peers. Patients will gain knowledge of new leisure interest/hobby.    Affect/Mood: Appropriate   Participation Level: Active   Participation Quality: Independent   Behavior: Appropriate   Speech/Thought Process: Coherent   Insight: Good   Judgement: Good   Modes of Intervention: Activity   Patient Response to Interventions:  Receptive   Education Outcome:  Acknowledges education   Clinical Observations/Individualized Feedback: Brenee was active in their participation of session activities and group discussion. Pt interacted well with LRT and peers duration of session.    Plan: Continue to engage patient in RT group sessions 2-3x/week.   Jeoffrey BRAVO Celestia, LRT, CTRS 01/14/2024 11:50 AM

## 2024-01-14 NOTE — Progress Notes (Signed)
   01/14/24 1956  Psych Admission Type (Psych Patients Only)  Admission Status Involuntary  Psychosocial Assessment  Patient Complaints None  Eye Contact Fair  Facial Expression Flat  Affect Flat  Speech Logical/coherent  Interaction Minimal  Motor Activity Other (Comment)  Appearance/Hygiene Unremarkable  Behavior Characteristics Cooperative;Appropriate to situation  Mood Pleasant  Aggressive Behavior  Effect No apparent injury  Thought Process  Coherency WDL  Content WDL  Delusions None reported or observed  Perception WDL  Hallucination None reported or observed  Judgment WDL  Confusion WDL  Danger to Self  Current suicidal ideation? Denies  Agreement Not to Harm Self Yes  Description of Agreement Verbal  Danger to Others  Danger to Others None reported or observed

## 2024-01-15 DIAGNOSIS — F410 Panic disorder [episodic paroxysmal anxiety] without agoraphobia: Secondary | ICD-10-CM | POA: Diagnosis not present

## 2024-01-15 DIAGNOSIS — F411 Generalized anxiety disorder: Secondary | ICD-10-CM | POA: Diagnosis not present

## 2024-01-15 LAB — GLUCOSE, CAPILLARY
Glucose-Capillary: 143 mg/dL — ABNORMAL HIGH (ref 70–99)
Glucose-Capillary: 147 mg/dL — ABNORMAL HIGH (ref 70–99)
Glucose-Capillary: 129 mg/dL — ABNORMAL HIGH (ref 70–99)
Glucose-Capillary: 134 mg/dL — ABNORMAL HIGH (ref 70–99)

## 2024-01-15 LAB — HEMOGLOBIN A1C
Hgb A1c MFr Bld: 7 % — ABNORMAL HIGH (ref 4.8–5.6)
Mean Plasma Glucose: 154 mg/dL

## 2024-01-15 NOTE — Progress Notes (Signed)
 [facility] MD Progress Note  01/15/2024 6:25 PM Natalie Rose  MRN:  [id number]  Natalie Rose is a 43 y.o. female with medical history significant for COPD, anxiety, depression, type 2 diabetes mellitus, PTSD and migraine, who presented with acute onset of epigastric abdominal pain as well as intractable nausea and vomiting that started today with associated diarrhea.   43 year old female with medical history significant for COPD, anxiety, depression, type 2 diabetes mellitus, PTSD, and migraine, who presented with acute onset of epigastric abdominal pain as well as intractable nausea and vomiting that started today with associated diarrhea. The patient had a cholecystectomy on 12/28/2023. She is now seen for initial psychiatric evaluation.  Subjective:  Chart reviewed, case discussed in multidisciplinary meeting, patient seen during rounds.  Patient is known to be participating in groups.  She reports that she is feeling better and is able to tolerate the medications with no reported side effects.  She denies SI/HI/plan and denies hallucinations.  Patient is aware of her discharge planning for tomorrow.  She is agreeable to work with the social work team about her post discharge planning. Sleep: Fair  Appetite:  Fair  Past Psychiatric History: see h&P Family History:  Family History  Problem Relation Age of Onset   Osteopenia Mother    Heart failure Mother    Osteoarthritis Mother    Depression Mother    Diabetes Father    Social History:  Social History   Substance and Sexual Activity  Alcohol Use Not Currently     Social History   Substance and Sexual Activity  Drug Use Yes   Frequency: 3.0 times per week   Types: Marijuana    Social History   Socioeconomic History   Marital status: Single    Spouse name: Not on file   Number of children: Not on file   Years of education: Not on file   Highest education level: Not on file  Occupational History   Not on  file  Tobacco Use   Smoking status: Every Day    Current packs/day: 1.00    Average packs/day: 1 pack/day for 21.6 years (21.6 ttl pk-yrs)    Types: Cigarettes    Start date: 2004   Smokeless tobacco: Never  Vaping Use   Vaping status: Never Used  Substance and Sexual Activity   Alcohol use: Not Currently   Drug use: Yes    Frequency: 3.0 times per week    Types: Marijuana   Sexual activity: Not Currently    Birth control/protection: None  Other Topics Concern   Not on file  Social History Narrative   Not on file   Social Drivers of Health   Financial Resource Strain: Medium Risk (03/08/2020)   Received from [facility]   Overall Financial Resource Strain (CARDIA)    Difficulty of Paying Living Expenses: Somewhat hard  Food Insecurity: No Food Insecurity (01/11/2024)   Hunger Vital Sign    Worried About Running Out of Food in the Last Year: Never true    Ran Out of Food in the Last Year: Never true  Transportation Needs: No Transportation Needs (01/11/2024)   PRAPARE - Administrator, Civil Service (Medical): No    Lack of Transportation (Non-Medical): No  Physical Activity: Insufficiently Active (05/02/2021)   Received from [facility]   Exercise Vital Sign    On average, how many days per week do you engage in moderate to strenuous exercise (like a brisk walk)?:  4 days    On average, how many minutes do you engage in exercise at this level?: 30 min  Stress: Not on file  Social Connections: Not on file   Past Medical History:  Past Medical History:  Diagnosis Date   Anxiety    Arthritis    COPD (chronic obstructive pulmonary disease) (HCC)    Depression    Diabetes mellitus without complication (HCC)    Mental health disorder    Migraines     Past Surgical History:  Procedure Laterality Date   CHOLECYSTECTOMY     MOUTH SURGERY      Current Medications: Current Facility-Administered Medications  Medication Dose Route Frequency Provider Last  Rate Last Admin   acetaminophen  (TYLENOL ) tablet 650 mg  650 mg Oral Q6H PRN Montague, Crystal J, NP       Or   acetaminophen  (TYLENOL ) suppository 650 mg  650 mg Rectal Q6H PRN Montague, Crystal J, NP       haloperidol  (HALDOL ) tablet 5 mg  5 mg Oral TID PRN Jordi Lacko, MD       And   diphenhydrAMINE  (BENADRYL ) capsule 50 mg  50 mg Oral TID PRN Westyn Driggers, MD       haloperidol  lactate (HALDOL ) injection 5 mg  5 mg Intramuscular TID PRN Deborha Moseley, MD       And   diphenhydrAMINE  (BENADRYL ) injection 50 mg  50 mg Intramuscular TID PRN Veora Fonte, MD       And   LORazepam  (ATIVAN ) injection 2 mg  2 mg Intramuscular TID PRN Rayya Yagi, MD       haloperidol  lactate (HALDOL ) injection 10 mg  10 mg Intramuscular TID PRN Ren Grasse, MD       And   diphenhydrAMINE  (BENADRYL ) injection 50 mg  50 mg Intramuscular TID PRN Hoke Baer, MD       And   LORazepam  (ATIVAN ) injection 2 mg  2 mg Intramuscular TID PRN Bowman Higbie, MD       DULoxetine  (CYMBALTA ) DR capsule 30 mg  30 mg Oral Daily Memorie Yokoyama, MD   30 mg at 01/15/24 0754   insulin  aspart (novoLOG ) injection 0-15 Units  0-15 Units Subcutaneous TID WC Montague, Crystal J, NP   2 Units at 01/15/24 1633   insulin  aspart (novoLOG ) injection 0-5 Units  0-5 Units Subcutaneous QHS Montague, Crystal J, NP       magnesium  hydroxide (MILK OF MAGNESIA) suspension 30 mL  30 mL Oral Daily PRN Montague, Crystal J, NP       nicotine  polacrilex (NICORETTE ) gum 2 mg  2 mg Oral PRN Hosanna Betley, MD       OLANZapine  (ZYPREXA ) tablet 10 mg  10 mg Oral Daily Cleotilde Hoy HERO, NP   10 mg at 01/15/24 9245   ondansetron  (ZOFRAN ) tablet 4 mg  4 mg Oral Q6H PRN Montague, Crystal J, NP       Or   ondansetron  (ZOFRAN ) injection 4 mg  4 mg Intravenous Q6H PRN Montague, Crystal J, NP       traZODone  (DESYREL ) tablet 25 mg  25 mg Oral QHS PRN Montague, Crystal J, NP   25 mg at 01/14/24 2053    Lab Results:  Results for orders  placed or performed during the hospital encounter of 01/11/24 (from the past 48 hours)  Glucose, capillary     Status: Abnormal   Collection Time: 01/13/24  8:47 PM  Result Value Ref Range   Glucose-Capillary 116 (H)  70 - 99 mg/dL    Comment: Glucose reference range applies only to samples taken after fasting for at least 8 hours.  Glucose, capillary     Status: Abnormal   Collection Time: 01/14/24  6:27 AM  Result Value Ref Range   Glucose-Capillary 128 (H) 70 - 99 mg/dL    Comment: Glucose reference range applies only to samples taken after fasting for at least 8 hours.  Glucose, capillary     Status: Abnormal   Collection Time: 01/14/24 11:38 AM  Result Value Ref Range   Glucose-Capillary 116 (H) 70 - 99 mg/dL    Comment: Glucose reference range applies only to samples taken after fasting for at least 8 hours.   Comment 1 Notify RN   Glucose, capillary     Status: Abnormal   Collection Time: 01/14/24  4:24 PM  Result Value Ref Range   Glucose-Capillary 134 (H) 70 - 99 mg/dL    Comment: Glucose reference range applies only to samples taken after fasting for at least 8 hours.  Glucose, capillary     Status: Abnormal   Collection Time: 01/14/24  8:40 PM  Result Value Ref Range   Glucose-Capillary 110 (H) 70 - 99 mg/dL    Comment: Glucose reference range applies only to samples taken after fasting for at least 8 hours.   Comment 1 Notify RN   Glucose, capillary     Status: Abnormal   Collection Time: 01/15/24  8:01 AM  Result Value Ref Range   Glucose-Capillary 143 (H) 70 - 99 mg/dL    Comment: Glucose reference range applies only to samples taken after fasting for at least 8 hours.  Glucose, capillary     Status: Abnormal   Collection Time: 01/15/24 11:47 AM  Result Value Ref Range   Glucose-Capillary 147 (H) 70 - 99 mg/dL    Comment: Glucose reference range applies only to samples taken after fasting for at least 8 hours.  Glucose, capillary     Status: Abnormal   Collection  Time: 01/15/24  4:28 PM  Result Value Ref Range   Glucose-Capillary 129 (H) 70 - 99 mg/dL    Comment: Glucose reference range applies only to samples taken after fasting for at least 8 hours.    Blood Alcohol level:  Lab Results  Component Value Date   [facility] <15 01/09/2024   ETH <10 01/11/2023    Metabolic Disorder Labs: Lab Results  Component Value Date   HGBA1C 7.0 (H) 01/13/2024   MPG 154 01/13/2024   MPG 146 01/08/2024   No results found for: PROLACTIN Lab Results  Component Value Date   CHOL 279 (H) 01/13/2024   TRIG 254 (H) 01/13/2024   HDL 36 (L) 01/13/2024   CHOLHDL 7.8 01/13/2024   VLDL 51 (H) 01/13/2024   LDLCALC 192 (H) 01/13/2024   LDLCALC 196 (H) 01/11/2023    Physical Findings: AIMS:  , ,  ,  ,    CIWA:    COWS:      Psychiatric Specialty Exam:  Presentation  General Appearance: Appropriate for Environment; Casual  Eye Contact:Fair  Speech:Clear and Coherent  Speech Volume:Normal    Mood and Affect  Mood:Anxious  Affect:Appropriate   Thought Process  Thought Processes:Coherent  Descriptions of Associations:Intact  Orientation:Full (Time, Place and Person)  Thought Content:Logical  Hallucinations:No data recorded  Ideas of Reference:None  Suicidal Thoughts:No data recorded  Homicidal Thoughts:No data recorded   Sensorium  Memory:Immediate Fair; Recent Fair; Remote Fair  Judgment:Fair  Insight:Fair   Executive Functions  Concentration:Fair  Attention Span:Fair  Recall:Fair  Fund of Knowledge:Fair  Language:Fair   Psychomotor Activity  Psychomotor Activity:No data recorded  Musculoskeletal: Strength & Muscle Tone: within normal limits Gait & Station: normal Assets  Assets:Communication Skills; Housing    Physical Exam: Physical Exam Vitals and nursing note reviewed.    ROS Blood pressure 126/89, pulse 73, temperature (!) 97.3 F (36.3 C), resp. rate 18, height 4' 11 (1.499 m), weight 79.4 kg,  last menstrual period 12/28/2023, SpO2 100%. Body mass index is 35.35 kg/m.  Diagnosis: Principal Problem:   Generalized anxiety disorder with panic attacks   PLAN: Safety and Monitoring:  -- Voluntary admission to inpatient psychiatric unit for safety, stabilization and treatment  -- Daily contact with patient to assess and evaluate symptoms and progress in treatment  -- Patient's case to be discussed in multi-disciplinary team meeting  -- Observation Level : q15 minute checks  -- Vital signs:  q12 hours  -- Precautions: suicide, elopement, and assault -- Encouraged patient to participate in unit milieu and in scheduled group therapies  2. Psychiatric Diagnoses and Treatment:    Zyprexa  10 mg at bedtime Added cymbalta  30 mg daily to help with anxiety     3. Medical Issues Being Addressed:     4. Discharge Planning:   -- Social work and case management to assist with discharge planning and identification of hospital follow-up needs prior to discharge  -- Estimated LOS: 3-4 days  Lashante Fryberger, MD 01/15/2024, 6:25 PM

## 2024-01-15 NOTE — Progress Notes (Signed)
   01/15/24 2000  Psych Admission Type (Psych Patients Only)  Admission Status Involuntary  Psychosocial Assessment  Patient Complaints None  Eye Contact Fair  Facial Expression Flat  Affect Flat  Speech Soft;Logical/coherent  Interaction Minimal  Motor Activity Slow  Appearance/Hygiene Unremarkable  Behavior Characteristics Cooperative;Appropriate to situation  Mood Pleasant  Thought Process  Coherency WDL  Content WDL  Delusions None reported or observed  Perception WDL  Hallucination None reported or observed  Judgment WDL  Confusion WDL  Danger to Self  Agreement Not to Harm Self Yes  Description of Agreement Verbal  Danger to Others  Danger to Others None reported or observed

## 2024-01-15 NOTE — Progress Notes (Signed)
   01/15/24 0800  Psych Admission Type (Psych Patients Only)  Admission Status Involuntary  Psychosocial Assessment  Patient Complaints None  Eye Contact Fair  Facial Expression Flat  Affect Flat  Speech Logical/coherent  Interaction Minimal  Motor Activity Slow  Appearance/Hygiene Unremarkable  Behavior Characteristics Cooperative;Appropriate to situation  Mood Pleasant  Aggressive Behavior  Effect No apparent injury  Thought Process  Coherency WDL  Content WDL  Delusions None reported or observed  Perception WDL  Hallucination None reported or observed  Judgment WDL  Confusion WDL  Danger to Self  Current suicidal ideation? Denies  Agreement Not to Harm Self Yes  Description of Agreement verbal  Danger to Others  Danger to Others None reported or observed

## 2024-01-15 NOTE — Group Note (Signed)
 Date:  01/15/2024 Time:  9:01 PM  Group Topic/Focus:  Goals Group:   The focus of this group is to help patients establish daily goals to achieve during treatment and discuss how the patient can incorporate goal setting into their daily lives to aide in recovery.    Participation Level:  Active  Participation Quality:  Appropriate  Affect:  Appropriate  Cognitive:  Appropriate  Insight: Appropriate  Engagement in Group:  Engaged  Modes of Intervention:  Activity  Additional Comments:    Raela Bohl 01/15/2024, 9:01 PM

## 2024-01-15 NOTE — Group Note (Signed)
 Date:  01/15/2024 Time:  4:12 PM  Group Topic/Focus:  Activity Group: The focus of the group is to promote activity for the patients and encourage them to go outside to the courtyard and get some fresh air and some exercise.    Participation Level:  Active  Participation Quality:  Appropriate  Affect:  Appropriate  Cognitive:  Appropriate  Insight: Appropriate  Engagement in Group:  Engaged  Modes of Intervention:  Activity  Additional Comments:    Camellia HERO Sabino Denning 01/15/2024, 4:12 PM

## 2024-01-15 NOTE — Group Note (Signed)
 LCSW Group Therapy Note   Group Date: 01/15/2024 Start Time: 1300 End Time: 1400   Type of Therapy and Topic:  Group Therapy: Challenging Core Beliefs  Participation Level:  Minimal  Description of Group:  Patients were educated about core beliefs and asked to identify one harmful core belief that they have. Patients were asked to explore from where those beliefs originate. Patients were asked to discuss how those beliefs make them feel and the resulting behaviors of those beliefs. They were then be asked if those beliefs are true and, if so, what evidence they have to support them. Lastly, group members were challenged to replace those negative core beliefs with helpful beliefs.   Therapeutic Goals:   1. Patient will identify harmful core beliefs and explore the origins of such beliefs. 2. Patient will identify feelings and behaviors that result from those core beliefs. 3. Patient will discuss whether such beliefs are true. 4.  Patient will replace harmful core beliefs with helpful ones.  Summary of Patient Progress:  Patient minimally engaged in processing and exploring how core beliefs are formed and how they impact thoughts, feelings, and behaviors. Patient proved open to input from peers and feedback from CSW. Patient demonstrated basic insight into the subject matter, was respectful and supportive of peers, and participated throughout the entire session.  Therapeutic Modalities: Cognitive Behavioral Therapy; Solution-Focused Therapy   Natalie Rose, Natalie Rose 01/15/2024  3:07 PM

## 2024-01-15 NOTE — Plan of Care (Signed)
 Problem: Education: Goal: Knowledge of [facility] Education information/materials will improve Outcome: Progressing Goal: Emotional status will improve Outcome: Progressing Goal: Mental status will improve Outcome: Progressing Goal: Verbalization of understanding the information provided will improve Outcome: Progressing   Problem: Activity: Goal: Interest or engagement in activities will improve Outcome: Progressing Goal: Sleeping patterns will improve Outcome: Progressing   Problem: Coping: Goal: Ability to verbalize frustrations and anger appropriately will improve Outcome: Progressing Goal: Ability to demonstrate self-control will improve Outcome: Progressing   Problem: Health Behavior/Discharge Planning: Goal: Identification of resources available to assist in meeting health care needs will improve Outcome: Progressing Goal: Compliance with treatment plan for underlying cause of condition will improve Outcome: Progressing   Problem: Physical Regulation: Goal: Ability to maintain clinical measurements within normal limits will improve Outcome: Progressing   Problem: Safety: Goal: Periods of time without injury will increase Outcome: Progressing   Problem: Education: Goal: Ability to state activities that reduce stress will improve Outcome: Progressing   Problem: Coping: Goal: Ability to identify and develop effective coping behavior will improve Outcome: Progressing   Problem: Self-Concept: Goal: Ability to identify factors that promote anxiety will improve Outcome: Progressing Goal: Level of anxiety will decrease Outcome: Progressing Goal: Ability to modify response to factors that promote anxiety will improve Outcome: Progressing   Problem: Education: Goal: Utilization of techniques to improve thought processes will improve Outcome: Progressing Goal: Knowledge of the prescribed therapeutic regimen will improve Outcome: Progressing   Problem:  Activity: Goal: Interest or engagement in leisure activities will improve Outcome: Progressing Goal: Imbalance in normal sleep/wake cycle will improve Outcome: Progressing   Problem: Coping: Goal: Coping ability will improve Outcome: Progressing Goal: Will verbalize feelings Outcome: Progressing   Problem: Health Behavior/Discharge Planning: Goal: Ability to make decisions will improve Outcome: Progressing Goal: Compliance with therapeutic regimen will improve Outcome: Progressing   Problem: Role Relationship: Goal: Will demonstrate positive changes in social behaviors and relationships Outcome: Progressing   Problem: Safety: Goal: Ability to disclose and discuss suicidal ideas will improve Outcome: Progressing Goal: Ability to identify and utilize support systems that promote safety will improve Outcome: Progressing   Problem: Self-Concept: Goal: Will verbalize positive feelings about self Outcome: Progressing Goal: Level of anxiety will decrease Outcome: Progressing   Problem: Education: Goal: Knowledge of [facility] Education information/materials will improve Outcome: Progressing Goal: Emotional status will improve Outcome: Progressing Goal: Mental status will improve Outcome: Progressing Goal: Verbalization of understanding the information provided will improve Outcome: Progressing   Problem: Activity: Goal: Interest or engagement in activities will improve Outcome: Progressing Goal: Sleeping patterns will improve Outcome: Progressing   Problem: Coping: Goal: Ability to verbalize frustrations and anger appropriately will improve Outcome: Progressing Goal: Ability to demonstrate self-control will improve Outcome: Progressing   Problem: Health Behavior/Discharge Planning: Goal: Identification of resources available to assist in meeting health care needs will improve Outcome: Progressing Goal: Compliance with treatment plan for underlying cause of  condition will improve Outcome: Progressing   Problem: Physical Regulation: Goal: Ability to maintain clinical measurements within normal limits will improve Outcome: Progressing   Problem: Safety: Goal: Periods of time without injury will increase Outcome: Progressing   Problem: Education: Goal: Ability to make informed decisions regarding treatment will improve Outcome: Progressing   Problem: Coping: Goal: Coping ability will improve Outcome: Progressing   Problem: Health Behavior/Discharge Planning: Goal: Identification of resources available to assist in meeting health care needs will improve Outcome: Progressing   Problem: Medication: Goal: Compliance with prescribed  medication regimen will improve Outcome: Progressing   Problem: Self-Concept: Goal: Ability to disclose and discuss suicidal ideas will improve Outcome: Progressing Goal: Will verbalize positive feelings about self Outcome: Progressing Note: Patient is on track. Patient will work on increased adherence

## 2024-01-15 NOTE — Group Note (Signed)
 Date:  01/15/2024 Time:  1:05 PM  Group Topic/Focus:  Building Self Esteem:   The Focus of this group is helping patients become aware of the effects of self-esteem on their lives, the things they and others do that enhance or undermine their self-esteem, seeing the relationship between their level of self-esteem and the choices they make and learning ways to enhance self-esteem.    Participation Level:  Active  Participation Quality:  Appropriate  Affect:  Appropriate  Cognitive:  Alert  Insight: Appropriate  Engagement in Group:  Engaged  Modes of Intervention:  Activity, Discussion, and Education  Additional Comments:    Natalie Rose 01/15/2024, 1:05 PM

## 2024-01-16 LAB — GLUCOSE, CAPILLARY
Glucose-Capillary: 130 mg/dL — ABNORMAL HIGH (ref 70–99)
Glucose-Capillary: 104 mg/dL — ABNORMAL HIGH (ref 70–99)

## 2024-01-16 MED ORDER — DULOXETINE HCL 30 MG PO CPEP: 30.0000 mg | ORAL_CAPSULE | Freq: Every day | ORAL | 0 refills | Status: AC

## 2024-01-16 MED ORDER — OLANZAPINE 10 MG PO TABS: 10.0000 mg | ORAL_TABLET | Freq: Every day | ORAL | 0 refills | Status: AC

## 2024-01-16 NOTE — Group Note (Signed)
 Recreation Therapy Group Note   Group Topic:General Recreation  Group Date: 01/16/2024 Start Time: 1000 End Time: 1100 Facilitators: Celestia Jeoffrey BRAVO, LRT, CTRS Location: Courtyard  Group Description: [employer]. LRT and patients played games of basketball, drew with chalk, and played corn hole while outside in the courtyard while getting fresh air and sunlight. Music was being played in the background. LRT and peers conversed about different games they have played before, what they do in their free time and anything else that is on their minds. LRT encouraged pts to drink water  after being outside, sweating and getting their heart rate up.  Goal Area(s) Addressed: Patient will build on frustration tolerance skills. Patients will partake in a competitive play game with peers. Patients will gain knowledge of new leisure interest/hobby.    Affect/Mood: Appropriate   Participation Level: Active   Participation Quality: Independent   Behavior: Appropriate   Speech/Thought Process: Coherent   Insight: Fair   Judgement: Fair    Modes of Intervention: Activity   Patient Response to Interventions:  Receptive   Education Outcome:  Acknowledges education   Clinical Observations/Individualized Feedback: Natalie Rose was active in their participation of session activities and group discussion. Pt interacted well with LRT and peers duration of session.    Plan: Continue to engage patient in RT group sessions 2-3x/week.   Jeoffrey BRAVO Celestia, LRT, CTRS 01/16/2024 11:51 AM

## 2024-01-16 NOTE — Discharge Summary (Signed)
 Physician Discharge Summary Note  Patient:  Natalie Rose is an 43 y.o., female MRN:  [id number] DOB:  Sep 16, 1980 Patient phone:  [phone] (home)  Patient address:   [address] [place] [address],   Total time spent: 40 min Date of Admission:  01/11/2024 Date of Discharge: 01/16/24  Reason for Admission:  Natalie Rose is a 43 y.o. female with medical history significant for COPD, anxiety, depression, type 2 diabetes mellitus, PTSD and migraine, who presented with acute onset of epigastric abdominal pain as well as intractable nausea and vomiting that started today with associated diarrhea.  Psychiatry was consulted as patient reportedly took 8 to 10 pills of Klonopin  as overdose.Patient is admitted to adult psych unit with Q15 min safety monitoring. Multidisciplinary team approach is offered. Medication management; group/milieu therapy is offered.   Principal Problem: Generalized anxiety disorder with panic attacks Discharge Diagnoses: Principal Problem:   Generalized anxiety disorder with panic attacks   Past Psychiatric History: see h&p  Family Psychiatric  History: see h&p Social History:  Social History   Substance and Sexual Activity  Alcohol Use Not Currently     Social History   Substance and Sexual Activity  Drug Use Yes   Frequency: 3.0 times per week   Types: Marijuana    Social History   Socioeconomic History   Marital status: Single    Spouse name: Not on file   Number of children: Not on file   Years of education: Not on file   Highest education level: Not on file  Occupational History   Not on file  Tobacco Use   Smoking status: Every Day    Current packs/day: 1.00    Average packs/day: 1 pack/day for 21.6 years (21.6 ttl pk-yrs)    Types: Cigarettes    Start date: 2004   Smokeless tobacco: Never  Vaping Use   Vaping status: Never Used  Substance and Sexual Activity   Alcohol use: Not Currently   Drug use:  Yes    Frequency: 3.0 times per week    Types: Marijuana   Sexual activity: Not Currently    Birth control/protection: None  Other Topics Concern   Not on file  Social History Narrative   Not on file   Social Drivers of Health   Financial Resource Strain: Medium Risk (03/08/2020)   Received from [facility]   Overall Financial Resource Strain (CARDIA)    Difficulty of Paying Living Expenses: Somewhat hard  Food Insecurity: No Food Insecurity (01/11/2024)   Hunger Vital Sign    Worried About Running Out of Food in the Last Year: Never true    Ran Out of Food in the Last Year: Never true  Transportation Needs: No Transportation Needs (01/11/2024)   PRAPARE - Administrator, Civil Service (Medical): No    Lack of Transportation (Non-Medical): No  Physical Activity: Insufficiently Active (05/02/2021)   Received from [facility]   Exercise Vital Sign    On average, how many days per week do you engage in moderate to strenuous exercise (like a brisk walk)?: 4 days    On average, how many minutes do you engage in exercise at this level?: 30 min  Stress: Not on file  Social Connections: Not on file   Past Medical History:  Past Medical History:  Diagnosis Date   Anxiety    Arthritis    COPD (chronic obstructive pulmonary disease) (HCC)    Depression  Diabetes mellitus without complication (HCC)    Mental health disorder    Migraines     Past Surgical History:  Procedure Laterality Date   CHOLECYSTECTOMY     MOUTH SURGERY     Family History:  Family History  Problem Relation Age of Onset   Osteopenia Mother    Heart failure Mother    Osteoarthritis Mother    Depression Mother    Diabetes Father     Hospital Course:  Natalie Rose is a 43 y.o. female with medical history significant for COPD, anxiety, depression, type 2 diabetes mellitus, PTSD and migraine, who presented with acute onset of epigastric abdominal pain as well as intractable  nausea and vomiting that started today with associated diarrhea.  Psychiatry was consulted as patient reportedly took 8 to 10 pills of Klonopin  as overdose.Patient is admitted to adult psych unit with Q15 min safety monitoring. Multidisciplinary team approach is offered. Medication management; group/milieu therapy is offered.  Detailed risk assessment is complete based on clinical exam and individual risk factors and acute suicide risk is low and acute violence risk is low.    On admission patient was on Zyprexa  10 mg daily which was continued.  She reports Zyprexa  helping with her mood stability.  Cymbalta  30 mg was added to help with her anxiety and depression.  Outpatient medication Lybalvi  was noted but patient does not remember taking it.  Patient was educated to just take Zyprexa  and work with her outpatient providers for further adjustment of the doses.   Currently, all modifiable risk of harm to self/harm to others have been addressed and patient is no longer appropriate for the acute inpatient setting and is able to continue treatment for mental health needs in the community with the supports as indicated below.  Patient is educated and verbalized understanding of discharge plan of care including medications, follow-up appointments, mental health resources and further crisis services in the community.  He is instructed to call 911 or present to the nearest emergency room should he experience any decompensation in mood, disturbance of bowel or return of suicidal/homicidal ideations.  Patient verbalizes understanding of this education and agrees to this plan of care  Physical Findings: AIMS:  , ,  ,  ,    CIWA:    COWS:        Psychiatric Specialty Exam:  Presentation  General Appearance:  Appropriate for Environment; Casual  Eye Contact: Fair  Speech: Clear and Coherent  Speech Volume: Normal    Mood and Affect  Mood: Euthymic  Affect: Appropriate   Thought Process   Thought Processes: Coherent  Descriptions of Associations:Intact  Orientation:Full (Time, Place and Person)  Thought Content:Logical  Hallucinations:Hallucinations: None  Ideas of Reference:None  Suicidal Thoughts:Suicidal Thoughts: No  Homicidal Thoughts:Homicidal Thoughts: No   Sensorium  Memory: Immediate Fair; Remote Fair; Recent Fair  Judgment: Fair  Insight: Fair   Art therapist  Concentration: Fair  Attention Span: Fair  Recall: [employer] of Knowledge: Fair  Language: Fair   Psychomotor Activity  Psychomotor Activity: Psychomotor Activity: Normal  Musculoskeletal: Strength & Muscle Tone: within normal limits Gait & Station: normal Assets  Assets: Manufacturing systems engineer; Social Support   Sleep  Sleep: Sleep: Fair    Physical Exam: Physical Exam Vitals and nursing note reviewed.    ROS Blood pressure 132/80, pulse (!) 106, temperature (!) 97.3 F (36.3 C), resp. rate 18, height 4' 11 (1.499 m), weight 79.4 kg, last menstrual period 12/28/2023, SpO2 98%.  Body mass index is 35.35 kg/m.   Social History   Tobacco Use  Smoking Status Every Day   Current packs/day: 1.00   Average packs/day: 1 pack/day for 21.6 years (21.6 ttl pk-yrs)   Types: Cigarettes   Start date: 2004  Smokeless Tobacco Never   Tobacco Cessation:  N/A, patient does not currently use tobacco products   Blood Alcohol level:  Lab Results  Component Value Date   [facility] <15 01/09/2024   ETH <10 01/11/2023    Metabolic Disorder Labs:  Lab Results  Component Value Date   HGBA1C 7.0 (H) 01/13/2024   MPG 154 01/13/2024   MPG 146 01/08/2024   No results found for: PROLACTIN Lab Results  Component Value Date   CHOL 279 (H) 01/13/2024   TRIG 254 (H) 01/13/2024   HDL 36 (L) 01/13/2024   CHOLHDL 7.8 01/13/2024   VLDL 51 (H) 01/13/2024   LDLCALC 192 (H) 01/13/2024   LDLCALC 196 (H) 01/11/2023    See Psychiatric Specialty Exam and Suicide  Risk Assessment completed by Attending Physician prior to discharge.  Discharge destination:  Home  Is patient on multiple antipsychotic therapies at discharge:  No   Has Patient had three or more failed trials of antipsychotic monotherapy by history:  No  Recommended Plan for Multiple Antipsychotic Therapies: NA   Allergies as of 01/16/2024       Reactions   Diazepam  Hives, Anxiety, Itching, Other (See Comments), Rash   unknown unknown   Ativan  [lorazepam ] Other (See Comments)   Per pt   Lurasidone         Medication List     STOP taking these medications    AeroChamber MV inhaler       TAKE these medications      Indication  albuterol  108 (90 Base) MCG/ACT inhaler Commonly known as: VENTOLIN  HFA Inhale 2 puffs into the lungs every 4 (four) hours as needed.  Indication: Chronic Obstructive Lung Disease   atenolol  50 MG tablet Commonly known as: TENORMIN  Take 1 tablet (50 mg total) by mouth daily.  Indication: High Blood Pressure   DULoxetine  30 MG capsule Commonly known as: CYMBALTA  Take 1 capsule (30 mg total) by mouth daily. Start taking on: January 17, 2024  Indication: Major Depressive Disorder   gabapentin  300 MG capsule Commonly known as: NEURONTIN  Take 1 capsule (300 mg total) by mouth 3 (three) times daily.  Indication: Social Anxiety Disorder   Lybalvi  10-10 MG Tabs Generic drug: OLANZapine -Samidorphan Take 1 tablet by mouth daily.  Indication: mood disorder   metFORMIN  500 MG tablet Commonly known as: GLUCOPHAGE  Take 1 tablet (500 mg total) by mouth 2 (two) times daily with a meal.  Indication: Type 2 Diabetes   OLANZapine  10 MG tablet Commonly known as: ZYPREXA  Take 1 tablet (10 mg total) by mouth daily. Start taking on: January 17, 2024  Indication: Depressive Phase of Manic-Depression   sucralfate  1 g tablet Commonly known as: CARAFATE  Take 1 g by mouth 4 (four) times daily.  Indication: Esophagus Inflammation         Follow-up Information     [employer], [facility] [place]. Go to.   Why: Virtual medication management appointment is 01/20/24 at 10:20 AM with Mliss Parsley, PA. Contact information: [address]  [place], [place] [address]  [phone]        [facility] Follow up.   Why: Virtual therapy appointment is 01/23/24 at 10:00 AM Avelina Silber, MA, [facility]. Contact  information: [facility] [address] [place], [place], [address]  [phone]        [facility], [facility] Follow up.   Why: Please report for an initial Substance IOP (Intensive outpatient) assessment 01/20/24 at 8 AM.   You can also walk in for an IOP assessment Monday-Friday 8 AM-3 PM. Contact information: [address] [place] [address] [phone]                 Follow-up recommendations:  Activity:  As tolerated    Signed: Amerigo Mcglory, MD 01/16/2024, 11:55 AM

## 2024-01-16 NOTE — Progress Notes (Addendum)
  [facility] Adult Case Management Discharge Plan :  Will you be returning to the same living situation after discharge:  Yes,  Patient to return home.  At discharge, do you have transportation home?: Yes,  Patient's friend to provide transportation.  Do you have the ability to pay for your medications: Yes,  MEDICARE / MEDICARE PART A AND B  Release of information consent forms completed and in the chart;  Patient's signature needed at discharge.  Patient to Follow up at:  Follow-up Information     [employer], [facility] [place]. Go to.   Why: Virtual medication management appointment is 01/20/24 at 10:20 AM with Mliss Parsley, PA. Contact information: [address]  [place], [place] [address]  [phone]        [facility] Follow up.   Why: Virtual therapy appointment is 01/23/24 at 10:00 AM Avelina Silber, MA, [facility]. Contact information: [facility] [address] [place], [place], [address]  [phone]        [facility], [facility] Follow up.   Why: Please report for an initial Substance IOP (Intensive outpatient) assessment 01/20/24 at 8 AM.   You can also walk in for an IOP assessment Monday-Friday 8 AM-3 PM. Contact information: [address] [place] [address] [phone]                 Next level of care provider has access to [facility] Link:no  Safety Planning and Suicide Prevention discussed: Yes,  Education Completed; Donny Mace, mom, [phone],  has been identified by the patient as the family member/significant other with whom the patient will be residing, and identified as the person(s) who will aid the patient in the event of a mental health crisis (suicidal ideations/suicide attempt).  With written consent from the patient, the family member/significant other has been provided the following suicide prevention education, prior to the and/or following the discharge of the patient.      Has patient been referred to the Quitline?: Patient refused referral for treatment  Patient has been referred for addiction treatment: Yes, the patient will follow up with an outpatient provider for substance use disorder. Therapist: appointment made  Alveta CHRISTELLA Kerns, LCSW 01/16/2024, 9:21 AM

## 2024-01-16 NOTE — Progress Notes (Signed)
 Patient denies SI/I/AVH at this time. Discharge instructions, AVS, prescriptions, and transition record reviewed with patient. Patient agrees to comply with medication management, follow-up visit and outpatient therapy. Patient belongings returned to patient. Patient questions and concerns addressed and answered.  Patient ambulatory off unit. Patient discharged with family to home.

## 2024-01-16 NOTE — Group Note (Signed)
 LCSW Group Therapy Note   Group Date: 01/16/2024 Start Time: 0900 End Time: 1000   Type of Therapy and Topic:  Group Therapy: Boundaries  Participation Level:  None  Description of Group: This group will address the use of boundaries in their personal lives. Patients will explore why boundaries are important, the difference between healthy and unhealthy boundaries, and negative and postive outcomes of different boundaries and will look at how boundaries can be crossed.  Patients will be encouraged to identify current boundaries in their own lives and identify what kind of boundary is being set. Facilitators will guide patients in utilizing problem-solving interventions to address and correct types boundaries being used and to address when no boundary is being used. Understanding and applying boundaries will be explored and addressed for obtaining and maintaining a balanced life. Patients will be encouraged to explore ways to assertively make their boundaries and needs known to significant others in their lives, using other group members and facilitator for role play, support, and feedback.  Therapeutic Goals:  1.  Patient will identify areas in their life where setting clear boundaries could be  used to improve their life.  2.  Patient will identify signs/triggers that a boundary is not being respected. 3.  Patient will identify two ways to set boundaries in order to achieve balance in  their lives: 4.  Patient will demonstrate ability to communicate their needs and set boundaries  through discussion and/or role plays  Summary of Patient Progress:   Patient was present in group, however was engaged in discussion.  Patient appeared attentive.   Therapeutic Modalities:   Cognitive Behavioral Therapy Solution-Focused Therapy  Sherryle JINNY Margo, LCSW 01/16/2024  10:36 AM

## 2024-01-16 NOTE — Plan of Care (Signed)
   Problem: Education: Goal: Knowledge of [facility] Education information/materials will improve Outcome: Progressing Goal: Emotional status will improve Outcome: Progressing Goal: Mental status will improve Outcome: Progressing Goal: Verbalization of understanding the information provided will improve Outcome: Progressing

## 2024-01-16 NOTE — BHH Suicide Risk Assessment (Signed)
 [facility] Discharge Suicide Risk Assessment   Principal Problem: Generalized anxiety disorder with panic attacks Discharge Diagnoses: Principal Problem:   Generalized anxiety disorder with panic attacks   Total Time spent with patient: 30 minutes  Musculoskeletal: Strength & Muscle Tone: within normal limits Gait & Station: normal Patient leans: N/A  Psychiatric Specialty Exam  Presentation  General Appearance:  Appropriate for Environment; Casual  Eye Contact: Fair  Speech: Clear and Coherent  Speech Volume: Normal  Handedness: Right   Mood and Affect  Mood: Euthymic  Duration of Depression Symptoms: No data recorded Affect: Appropriate   Thought Process  Thought Processes: Coherent  Descriptions of Associations:Intact  Orientation:Full (Time, Place and Person)  Thought Content:Logical  History of Schizophrenia/Schizoaffective disorder:No data recorded Duration of Psychotic Symptoms:No data recorded Hallucinations:Hallucinations: None  Ideas of Reference:None  Suicidal Thoughts:Suicidal Thoughts: No  Homicidal Thoughts:Homicidal Thoughts: No   Sensorium  Memory: Immediate Fair; Remote Fair; Recent Fair  Judgment: Fair  Insight: Fair   Art therapist  Concentration: Fair  Attention Span: Fair  Recall: [employer] of Knowledge: Fair  Language: Fair   Psychomotor Activity  Psychomotor Activity: Psychomotor Activity: Normal   Assets  Assets: Communication Skills; Social Support   Sleep  Sleep: Sleep: Fair  Estimated Sleeping Duration (Last 24 Hours): 6.50-7.25 hours  Physical Exam: Physical Exam ROS Blood pressure 132/80, pulse (!) 106, temperature (!) 97.3 F (36.3 C), resp. rate 18, height 4' 11 (1.499 m), weight 79.4 kg, last menstrual period 12/28/2023, SpO2 98%. Body mass index is 35.35 kg/m.  Mental Status Per Nursing Assessment::   On Admission:  NA  Demographic Factors:  Low socioeconomic  status  Loss Factors: Decrease in vocational status  Historical Factors: Impulsivity  Risk Reduction Factors:   Living with another person, especially a relative, Positive social support, Positive therapeutic relationship, and Positive coping skills or problem solving skills  Continued Clinical Symptoms:  Depression:   Impulsivity  Cognitive Features That Contribute To Risk:  None    Suicide Risk:  Minimal: No identifiable suicidal ideation.  Patients presenting with no risk factors but with morbid ruminations; may be classified as minimal risk based on the severity of the depressive symptoms   Follow-up Information     [employer], [facility]. Go to.   Why: Virtual medication management appointment is 01/20/24 at 10:20 AM with Mliss Parsley, PA. Contact information: [address]  [place], [place] [address]  [phone]        [facility] Follow up.   Why: Virtual therapy appointment is 01/23/24 at 10:00 AM Avelina Silber, MA, [facility]. Contact information: [facility] [address] [place], [place], [address]  [phone]        [facility], [facility] Follow up.   Why: Please report for an initial Substance IOP (Intensive outpatient) assessment 01/20/24 at 8 AM.   You can also walk in for an IOP assessment Monday-Friday 8 AM-3 PM. Contact information: [address] [place] [address] [phone]                 Plan Of Care/Follow-up recommendations:  Activity:  As tolerated  Allyn Foil, MD 01/16/2024, 11:48 AM

## 2024-01-17 NOTE — Care Management Important Message (Addendum)
 Important Message  Patient Details  Name: Natalie Rose MRN: [id number] Date of Birth: 03-Apr-1981   Medicare Important Message Given:  Yes - Medicare IM    Madalee Altmann, MSW, [facility] 01/17/2024 8:46 AM

## 2024-04-16 ENCOUNTER — Ambulatory Visit: Admission: EM | Admit: 2024-04-16 | Discharge: 2024-04-16 | Disposition: A | Discharge status: Home / Self Care

## 2024-04-16 DIAGNOSIS — R5383 Other fatigue: Secondary | ICD-10-CM | POA: Diagnosis not present

## 2024-04-16 DIAGNOSIS — R110 Nausea: Secondary | ICD-10-CM

## 2024-04-16 DIAGNOSIS — R6883 Chills (without fever): Secondary | ICD-10-CM

## 2024-04-16 LAB — POCT URINE DIPSTICK
Glucose, UA: NEGATIVE mg/dL
Nitrite, UA: NEGATIVE
Spec Grav, UA: 1.02 (ref 1.010–1.025)
Urobilinogen, UA: 0.2 U/dL
pH, UA: 6 (ref 5.0–8.0)

## 2024-04-16 LAB — GLUCOSE, POCT (MANUAL RESULT ENTRY): POCT Glucose (KUC): 117 mg/dL — AB (ref 70–99)

## 2024-04-16 LAB — POC COVID19/FLU A&B COMBO
Covid Antigen, POC: NEGATIVE
Influenza A Antigen, POC: NEGATIVE
Influenza B Antigen, POC: NEGATIVE

## 2024-04-16 LAB — GLUCOSE, CAPILLARY: Glucose-Capillary: 117 mg/dL — ABNORMAL HIGH (ref 70–99)

## 2024-04-16 MED ORDER — ONDANSETRON 4 MG PO TBDP: 4.0000 mg | ORAL_TABLET | Freq: Three times a day (TID) | ORAL | 0 refills | Status: AC | PRN

## 2024-04-16 NOTE — ED Triage Notes (Signed)
 Sx since this morning  Chills Nausea    No other sx/

## 2024-04-16 NOTE — Discharge Instructions (Addendum)
-  Negative flu and COVID testing today -Urine does not look like a clear UTI and you are not having UTI symptoms -Blood sugar was ok at 117 - At this time, I encourage you to increase your rest and fluid intake.  I sent Zofran  to pharmacy for you to take as needed for nausea. - If you develop fever, chest pain, abdominal pain, weakness or other symptoms you are not experiencing at this time you should be seen again right away.  Seek evaluation either here or in emergency department.

## 2024-04-16 NOTE — ED Provider Notes (Signed)
 [facility]    CSN: [id number] Arrival date & time: 04/16/24  1124      History   Chief Complaint Chief Complaint  Patient presents with   Nausea   Chills    HPI Natalie Rose is a 43 y.o. female with history of anxiety, depression, borderline personality disorder, COPD, tobacco abuse, diabetes, migraines, and polysubstance abuse.  She presents today for fatigue, nausea, and chills that began this morning.  Patient denies headaches, bodyaches, sweats, fever, cough, congestion, sore throat, chest pain, palpitations, shortness of breath, abdominal pain, vomiting, diarrhea, constipation, dysuria, frequency, urgency, hematuria, vaginal discharge or odor.  Denies any sick contacts.  Has not taken any OTC meds. She does report feeling more anxious today and states she took propranolol  and it helped. No other complaints or concerns.  HPI  Past Medical History:  Diagnosis Date   Anxiety    Arthritis    COPD (chronic obstructive pulmonary disease) (HCC)    Depression    Diabetes mellitus without complication (HCC)    Mental health disorder    Migraines     Patient Active Problem List   Diagnosis Date Noted   Generalized anxiety disorder with panic attacks 01/11/2024   Intractable nausea and vomiting 01/09/2024   Acute lower UTI 01/09/2024   Anxiety and depression 01/09/2024   Benzodiazepine overdose 01/09/2024   Type 2 diabetes mellitus without complications (HCC) 01/09/2024   Acute cholecystitis 12/28/2023   Mood disorder 01/15/2023   Intentional drug overdose (HCC) 01/11/2023   OCD (obsessive compulsive disorder) 09/16/2022   Panic attack 06/25/2022   Self-inflicted laceration of wrist (HCC) 06/17/2018   Cannabis abuse 06/17/2018   Borderline personality disorder (HCC) 09/20/2012   PTSD (post-traumatic stress disorder) 09/20/2012    Past Surgical History:  Procedure Laterality Date   CHOLECYSTECTOMY     MOUTH SURGERY      OB History   No  obstetric history on file.      Home Medications    Prior to Admission medications   Medication Sig Start Date End Date Taking? Authorizing Provider  gabapentin  (NEURONTIN ) 300 MG capsule Take 1 capsule (300 mg total) by mouth 3 (three) times daily. 09/21/22  Yes Clapacs, Norleen DASEN, MD  metFORMIN  (GLUCOPHAGE ) 500 MG tablet Take 1 tablet (500 mg total) by mouth 2 (two) times daily with a meal. 09/21/22  Yes Clapacs, Norleen DASEN, MD  ondansetron  (ZOFRAN -ODT) 4 MG disintegrating tablet Take 1 tablet (4 mg total) by mouth every 8 (eight) hours as needed. 04/16/24  Yes Arvis Huxley B, PA-C  propranolol  (INDERAL ) 10 MG tablet Take 10 mg by mouth 3 (three) times daily.   Yes [provider]  QUEtiapine  (SEROQUEL ) 100 MG tablet Take 100 mg by mouth at bedtime.   Yes [provider]  simvastatin (ZOCOR) 10 MG tablet Take 10 mg by mouth. 02/03/24 02/02/25 Yes [provider]  ferrous sulfate (FEROSUL) 325 (65 FE) MG tablet Take 325 mg by mouth daily with breakfast.  04/29/20  [provider]  albuterol  (VENTOLIN  HFA) 108 (90 Base) MCG/ACT inhaler Inhale 2 puffs into the lungs every 4 (four) hours as needed. 08/01/23   Bernardino Ditch, NP  atenolol  (TENORMIN ) 50 MG tablet Take 1 tablet (50 mg total) by mouth daily. 09/21/22   Clapacs, Norleen DASEN, MD  DULoxetine  (CYMBALTA ) 30 MG capsule Take 1 capsule (30 mg total) by mouth daily. 01/17/24   Donnelly Mellow, MD  LYBALVI  10-10 MG TABS Take 1 tablet by mouth daily.  12/20/22   [provider]  OLANZapine  (ZYPREXA ) 10 MG tablet Take 1 tablet (10 mg total) by mouth daily. 01/17/24   Donnelly Mellow, MD  sucralfate  (CARAFATE ) 1 g tablet Take 1 g by mouth 4 (four) times daily. 09/13/23   [provider]  eszopiclone (LUNESTA) 2 MG TABS tablet Take 2 mg by mouth at bedtime as needed for sleep. Take immediately before bedtime  04/29/20  [provider]  FLUoxetine  (PROZAC ) 40 MG capsule Take 40 mg by mouth daily.  10/08/19   [provider]  lurasidone (LATUDA) 40 MG TABS tablet Take 40 mg by mouth daily with breakfast.  10/08/19  [provider]    Family History Family History  Problem Relation Age of Onset   Osteopenia Mother    Heart failure Mother    Osteoarthritis Mother    Depression Mother    Diabetes Father     Social History Social History   Tobacco Use   Smoking status: Every Day    Current packs/day: 1.00    Average packs/day: 1 pack/day for 21.9 years (21.9 ttl pk-yrs)    Types: Cigarettes    Start date: 2004   Smokeless tobacco: Never  Vaping Use   Vaping status: Never Used  Substance Use Topics   Alcohol use: Not Currently   Drug use: Yes    Frequency: 3.0 times per week    Types: Marijuana     Allergies   Diazepam , Ativan  [lorazepam ], and Lurasidone   Review of Systems Review of Systems  Constitutional:  Positive for chills and fatigue. Negative for diaphoresis and fever.  HENT:  Negative for congestion, ear pain, rhinorrhea and sore throat.   Respiratory:  Negative for cough and shortness of breath.   Cardiovascular:  Negative for chest pain and palpitations.  Gastrointestinal:  Positive for nausea. Negative for abdominal pain, constipation, diarrhea and vomiting.  Genitourinary:  Negative for dysuria, frequency and urgency.  Musculoskeletal:  Negative for arthralgias and myalgias.  Skin:  Negative for rash.  Neurological:  Negative for dizziness, syncope, weakness and headaches.  Hematological:  Negative for adenopathy.     Physical Exam Triage Vital Signs ED Triage Vitals  Encounter Vitals Group     BP 04/16/24 1249 101/70     Girls Systolic BP Percentile --      Girls Diastolic BP Percentile --      Boys Systolic BP Percentile --      Boys Diastolic BP Percentile --      Pulse Rate 04/16/24 1249 74     Resp 04/16/24 1249 17     Temp 04/16/24 1249 98.6 F (37 C)     Temp Source 04/16/24 1249 Oral     SpO2 04/16/24 1249 94 %     Weight  04/16/24 1246 185 lb (83.9 kg)     Height --      Head Circumference --      Peak Flow --      Pain Score 04/16/24 1246 0     Pain Loc --      Pain Education --      Exclude from Growth Chart --    No data found.  Updated Vital Signs BP 101/70 (BP Location: Right Arm)   Pulse 74   Temp 98.6 F (37 C) (Oral)   Resp 17   Wt 185 lb (83.9 kg)   LMP 04/07/2024 (Exact Date)   SpO2 94%   BMI 37.37 kg/m    Physical  Exam Vitals and nursing note reviewed.  Constitutional:      General: She is not in acute distress.    Appearance: Normal appearance. She is not ill-appearing or toxic-appearing.  HENT:     Head: Normocephalic and atraumatic.     Nose: Nose normal.     Mouth/Throat:     Mouth: Mucous membranes are moist.     Pharynx: Oropharynx is clear.  Eyes:     General: No scleral icterus.       Right eye: No discharge.        Left eye: No discharge.     Conjunctiva/sclera: Conjunctivae normal.  Cardiovascular:     Rate and Rhythm: Normal rate and regular rhythm.     Heart sounds: Normal heart sounds.  Pulmonary:     Effort: Pulmonary effort is normal. No respiratory distress.     Breath sounds: Normal breath sounds.  Abdominal:     Palpations: Abdomen is soft.     Tenderness: There is no abdominal tenderness. There is no right CVA tenderness or left CVA tenderness.  Musculoskeletal:     Cervical back: Neck supple.  Skin:    General: Skin is dry.  Neurological:     General: No focal deficit present.     Mental Status: She is alert. Mental status is at baseline.     Motor: No weakness.     Gait: Gait normal.  Psychiatric:        Mood and Affect: Mood normal.        Behavior: Behavior normal.      UC Treatments / Results  Labs (all labs ordered are listed, but only abnormal results are displayed) Labs Reviewed  POCT URINE DIPSTICK - Abnormal; Notable for the following components:      Result Value   Bilirubin, UA small (*)    Ketones, POC UA small (15) (*)     Blood, UA trace-lysed (*)    Protein Ur, POC trace (*)    Leukocytes, UA Trace (*)    All other components within normal limits  POC COVID19/FLU A&B COMBO - Normal  GLUCOSE, POCT (MANUAL RESULT ENTRY)    EKG   Radiology No results found.  Procedures Procedures (including critical care time)  Medications Ordered in UC Medications - No data to display  Initial Impression / Assessment and Plan / UC Course  I have reviewed the triage vital signs and the nursing notes.  Pertinent labs & imaging results that were available during my care of the patient were reviewed by me and considered in my medical decision making (see chart for details).   43 year old female with anxiety, depression, borderline personality disorder, type 2 diabetes, COPD and tobacco abuse presents for onset of fatigue, chills and nausea without vomiting today.  Denies any URI symptoms, chest pain, cough, palpitations, shortness of breath, abdominal pain or UTI symptoms.  She does report feeling more anxious today and states she took propranolol  and it helped.  Normal vital signs.  No acute distress.  Benign exam.  Fingerstick glucose 117. Negative flu and COVID testing. Urinalysis shows small ketones, trace RBCs and trace protein with trace leukocytes.  She denies any urinary symptoms.  Not significantly concerning for UTI.  Symptoms are quite vague.  Unsure of exact cause.  Encouraged her to monitor symptoms and increase rest and fluids.  Bland diet.  Send Zofran  to pharmacy.  Reviewed returning for reevaluation if she develops fever or new/acute worsening of symptoms.  Patient is  understanding and agreeable.   Final Clinical Impressions(s) / UC Diagnoses   Final diagnoses:  Nausea without vomiting  Chills  Other fatigue     Discharge Instructions      -Negative flu and COVID testing today -Urine does not look like a clear UTI and you are not having UTI symptoms - At this time, I encourage you to  increase your rest and fluid intake.  I sent Zofran  to pharmacy for you to take as needed for nausea. - If you develop fever, chest pain, abdominal pain, weakness or other symptoms you are not experiencing at this time you should be seen again right away.  Seek evaluation either here or in emergency department.     ED Prescriptions     Medication Sig Dispense Auth. Provider   ondansetron  (ZOFRAN -ODT) 4 MG disintegrating tablet Take 1 tablet (4 mg total) by mouth every 8 (eight) hours as needed. 15 tablet Princesa Willig B, PA-C      PDMP not reviewed this encounter.   Arvis Jolan NOVAK, PA-C 04/16/24 1321
